# Patient Record
Sex: Male | Born: 1935
Health system: Southern US, Community
[De-identification: ages and names within clinical notes are randomized; demographics above are authoritative.]

## PROBLEM LIST (undated history)

## (undated) DIAGNOSIS — Z978 Presence of other specified devices: Secondary | ICD-10-CM

## (undated) DIAGNOSIS — R0600 Dyspnea, unspecified: Secondary | ICD-10-CM

## (undated) DIAGNOSIS — C61 Malignant neoplasm of prostate: Secondary | ICD-10-CM

## (undated) DIAGNOSIS — I10 Essential (primary) hypertension: Secondary | ICD-10-CM

## (undated) DIAGNOSIS — I509 Heart failure, unspecified: Secondary | ICD-10-CM

## (undated) DIAGNOSIS — E785 Hyperlipidemia, unspecified: Secondary | ICD-10-CM

## (undated) DIAGNOSIS — I2510 Atherosclerotic heart disease of native coronary artery without angina pectoris: Secondary | ICD-10-CM

## (undated) DIAGNOSIS — E669 Obesity, unspecified: Secondary | ICD-10-CM

## (undated) DIAGNOSIS — N179 Acute kidney failure, unspecified: Secondary | ICD-10-CM

## (undated) DIAGNOSIS — I219 Acute myocardial infarction, unspecified: Secondary | ICD-10-CM

## (undated) HISTORY — DX: Essential (primary) hypertension: I10

## (undated) HISTORY — DX: Atherosclerotic heart disease of native coronary artery without angina pectoris: I25.10

## (undated) HISTORY — DX: Hyperlipidemia, unspecified: E78.5

## (undated) HISTORY — DX: Obesity, unspecified: E66.9

## (undated) HISTORY — PX: OTHER SURGICAL HISTORY: SHX169

## (undated) HISTORY — DX: Malignant neoplasm of prostate: C61

## (undated) HISTORY — PX: TONSILLECTOMY: SUR1361

---

## 1998-10-03 DIAGNOSIS — I219 Acute myocardial infarction, unspecified: Secondary | ICD-10-CM

## 1998-10-03 HISTORY — PX: OTHER SURGICAL HISTORY: SHX169

## 1998-10-03 HISTORY — DX: Acute myocardial infarction, unspecified: I21.9

## 2001-10-03 DIAGNOSIS — Z8546 Personal history of malignant neoplasm of prostate: Secondary | ICD-10-CM | POA: Insufficient documentation

## 2010-05-06 ENCOUNTER — Ambulatory Visit: Payer: Self-pay | Admitting: Cardiology

## 2010-05-11 ENCOUNTER — Telehealth (INDEPENDENT_AMBULATORY_CARE_PROVIDER_SITE_OTHER): Payer: Self-pay | Admitting: *Deleted

## 2010-05-12 ENCOUNTER — Encounter (HOSPITAL_COMMUNITY): Admission: RE | Admit: 2010-05-12 | Discharge: 2010-06-24 | Payer: Self-pay | Admitting: Cardiology

## 2010-05-12 ENCOUNTER — Encounter: Payer: Self-pay | Admitting: Cardiology

## 2010-05-12 ENCOUNTER — Ambulatory Visit: Payer: Self-pay | Admitting: Cardiology

## 2010-05-12 ENCOUNTER — Ambulatory Visit: Payer: Self-pay

## 2010-11-02 NOTE — Assessment & Plan Note (Signed)
Summary: Cardiology Nuclear Testing  Nuclear Med Background Indications for Stress Test: Evaluation for Ischemia, Stent Patency, PTCA Patency   History: Angioplasty, Heart Catheterization, Myocardial Infarction, Myocardial Perfusion Study, Stents  History Comments: '00 MI>PTCA/Stent-LAD; '06 Cath:no report; '09 MPS:EF=51%, anterior scar   Symptoms Comments: No cardiac complaints. Follow up PTCA/Stent.   Nuclear Pre-Procedure Cardiac Risk Factors: Family History - CAD, History of Smoking, Hypertension, Lipids Caffeine/Decaff Intake: none NPO After: 6:30 PM Lungs: Clear.  O2 Sat 96% on RA. IV 0.9% NS with Angio Cath: 22g     IV Site: Rt Hand IV Started by: Bonnita Levan RN Chest Size (in) 44-46     Height (in): 73 Weight (lb): 208 BMI: 27.54 Tech Comments: Held Lopressor x 12 hours  Nuclear Med Study 1 or 2 day study:  1 day     Stress Test Type:  treadmill/Lexiscan Reading MD:  Marca Ancona, MD     Referring MD:  Roger Shelter, MD Resting Radionuclide:  Technetium 12m Tetrofosmin     Resting Radionuclide Dose:  10.9 mCi  Stress Radionuclide:  Technetium 65m Tetrofosmin     Stress Radionuclide Dose:  33.0 mCi   Stress Protocol Exercise Time (min):  2:00 min     Max HR:  102 bpm     Predicted Max HR:  147 bpm       Percent Max HR:  69.39 % Lexiscan: 0.4 mg   Stress Test Technologist:  Rea College CMA-N     Nuclear Technologist:  Harlow Asa CNMT  Rest Procedure  Myocardial perfusion imaging was performed at rest 45 minutes following the intravenous administration of Myoview Technetium 36m Tetrofosmin.  Stress Procedure  The patient received IV Lexiscan 0.4 mg over 15-seconds with concurrent low level exercise and then myoview was injected at 30-seconds while the patient continued walking one more minute.  There were no significant changes with infusion.  Quantitative spect images were obtained after a 45 minute delay.  QPS Raw Data Images:  Normal; no motion  artifact; normal heart/lung ratio. Stress Images:  Mid to apical anterior and apical septal perfusion defect.  Rest Images:  Mid to apical anterior and apical septal perfusion defect.  Subtraction (SDS):  Fixed mid to apical anterior and apical septal perfusion defect.  Transient Ischemic Dilatation:  1.00  (Normal <1.22)  Lung/Heart Ratio:  0.38  (Normal <0.45)  Quantitative Gated Spect Images QGS EDV:  108 ml QGS ESV:  55 ml QGS EF:  49 % QGS cine images:  Mid to apical anterior and true apex hypokinesis.    Overall Impression  Exercise Capacity: Lexiscan study.  BP Response: Normal blood pressure response. Clinical Symptoms: Dizzy ECG Impression: RBBB + old anteroseptal MI with no significant change with infusion.  Overall Impression: Fixed mid to apical anterior and apical septal perfusion defect suggestive of prior MI.  Mid to apical anterior and true apex hypokinesis with EF 49%.  No significant ischemia.

## 2010-11-02 NOTE — Progress Notes (Signed)
Summary: Nuclear Pre-Procedure  Phone Note Outgoing Call Call back at [facility] Phone [phone]   Call placed by: Stanton Kidney, EMT-P,  May 11, 2010 2:42 PM Call placed to: Patient Action Taken: Phone Call Completed Summary of Call: Reviewed information on Myoview Information Sheet (see scanned document for further details).  Spoke with the patient.    Nuclear Med Background Indications for Stress Test: Evaluation for Ischemia, Stent Patency   History: Abnormal EKG, Angioplasty, Heart Catheterization, Myocardial Infarction, Myocardial Perfusion Study, Stents  History Comments: 7/00 MI > Angioplasty > Stent: LAD '06 Heart Cath 7/09 MPS: EF= 51%, ant. scar     Nuclear Pre-Procedure Cardiac Risk Factors: Family History - CAD, History of Smoking, Hypertension, Lipids

## 2010-11-22 ENCOUNTER — Ambulatory Visit (INDEPENDENT_AMBULATORY_CARE_PROVIDER_SITE_OTHER): Payer: Medicare Other | Admitting: Cardiology

## 2010-11-22 DIAGNOSIS — I2510 Atherosclerotic heart disease of native coronary artery without angina pectoris: Secondary | ICD-10-CM

## 2011-11-25 ENCOUNTER — Encounter: Payer: Self-pay | Admitting: *Deleted

## 2011-11-25 ENCOUNTER — Encounter: Payer: Self-pay | Admitting: Cardiology

## 2011-11-25 ENCOUNTER — Ambulatory Visit (INDEPENDENT_AMBULATORY_CARE_PROVIDER_SITE_OTHER): Payer: Medicare Other | Admitting: Cardiology

## 2011-11-25 VITALS — BP 110/64 | HR 51 | Ht 72.0 in | Wt 219.0 lb

## 2011-11-25 DIAGNOSIS — I10 Essential (primary) hypertension: Secondary | ICD-10-CM | POA: Insufficient documentation

## 2011-11-25 DIAGNOSIS — M3300 Juvenile dermatopolymyositis, organ involvement unspecified: Secondary | ICD-10-CM | POA: Insufficient documentation

## 2011-11-25 DIAGNOSIS — I2510 Atherosclerotic heart disease of native coronary artery without angina pectoris: Secondary | ICD-10-CM | POA: Insufficient documentation

## 2011-11-25 DIAGNOSIS — E782 Mixed hyperlipidemia: Secondary | ICD-10-CM | POA: Insufficient documentation

## 2011-11-25 LAB — HEPATIC FUNCTION PANEL
Bilirubin, Direct: 0.1 mg/dL (ref 0.0–0.3)
Total Bilirubin: 0.7 mg/dL (ref 0.3–1.2)
Total Protein: 6.6 g/dL (ref 6.0–8.3)

## 2011-11-25 LAB — BASIC METABOLIC PANEL
BUN: 23 mg/dL (ref 6–23)
Chloride: 101 mEq/L (ref 96–112)
Glucose, Bld: 100 mg/dL — ABNORMAL HIGH (ref 70–99)
Potassium: 4.6 mEq/L (ref 3.5–5.1)

## 2011-11-25 LAB — LIPID PANEL
HDL: 54.1 mg/dL (ref >39.00)
VLDL: 32 mg/dL (ref 0.0–40.0)

## 2011-11-25 MED ORDER — NITROGLYCERIN 0.4 MG SL SUBL: 0.4000 mg | SUBLINGUAL_TABLET | SUBLINGUAL | Status: DC | PRN

## 2011-11-25 MED ORDER — LISINOPRIL 40 MG PO TABS: 40.0000 mg | ORAL_TABLET | Freq: Every day | ORAL | Status: DC

## 2011-11-25 MED ORDER — SIMVASTATIN 40 MG PO TABS: 40.0000 mg | ORAL_TABLET | Freq: Every evening | ORAL | Status: DC

## 2011-11-25 MED ORDER — METOPROLOL TARTRATE 50 MG PO TABS: 50.0000 mg | ORAL_TABLET | Freq: Two times a day (BID) | ORAL | Status: DC

## 2011-11-25 NOTE — Patient Instructions (Signed)
Your physician wants you to follow-up in: ONE YEAR  You will receive a reminder letter in the mail two months in advance. If you don't receive a letter, please call our office to schedule the follow-up appointment.   Your physician recommends that you return for lab work in: TODAY  

## 2011-11-25 NOTE — Assessment & Plan Note (Signed)
Continue aspirin, statin and beta blocker. Continue risk factor modification. Last Myoview shows no ischemia.

## 2011-11-25 NOTE — Assessment & Plan Note (Signed)
Continue statin. Check lipids and liver. 

## 2011-11-25 NOTE — Assessment & Plan Note (Signed)
Blood pressure controlled. Continue present medications. Check potassium and renal function. 

## 2011-11-25 NOTE — Progress Notes (Signed)
   HPI: Pleasant male previously followed by Dr. Deborah Chalk for followup of coronary artery disease. Patient had a myocardial infarction in July of 2000. He was treated with TPA and had stenting of his LAD and PTCA of his second diagonal. He apparently had a cardiac catheterization in 2006 in [facility]. This reveals a normal left main. There was a 40% percent proximal right coronary artery and a 20% LAD. There was mitral valve prolapse with mild mitral regurgitation. Ejection fraction was 63%. Last nuclear study was performed in August of 2011. This showed an ejection fraction of 49%. There was a prior infarct but no ischemia. Patient was last seen in February of 2012. Since then, the patient denies any dyspnea on exertion, orthopnea, PND, pedal edema, palpitations, syncope or chest pain.   Current Outpatient Prescriptions  Medication Sig Dispense Refill  . aspirin 325 MG EC tablet Take 325 mg by mouth daily.      . Calcium Carb-Cholecalciferol (CALTRATE 600+D) 600-800 MG-UNIT TABS Take 1 tablet by mouth daily.      Marland Kitchen lisinopril (PRINIVIL,ZESTRIL) 40 MG tablet Take 40 mg by mouth daily.      . metoprolol (LOPRESSOR) 50 MG tablet Take 50 mg by mouth 2 (two) times daily.      . Multiple Vitamin (MULTIVITAMIN) capsule Take 1 capsule by mouth daily.      . nitroGLYCERIN (NITROSTAT) 0.4 MG SL tablet Place 0.4 mg under the tongue every 5 (five) minutes as needed.      . Omega-3 Fatty Acids (FISH OIL PO) Take 1 tablet by mouth daily.      . simvastatin (ZOCOR) 40 MG tablet Take 40 mg by mouth every evening.      . vitamin C (ASCORBIC ACID) 500 MG tablet Take 500 mg by mouth daily.         Past Medical History  Diagnosis Date  . HTN (hypertension)   . CAD (coronary artery disease)   . Hyperlipidemia   . Obesity   . Prostate cancer     Past Surgical History  Procedure Date  . Stenting of lad     History   Social History  . Marital Status: Married    Spouse Name: N/A    Number of Children:  N/A  . Years of Education: N/A   Occupational History  . Not on file.   Social History Main Topics  . Smoking status: Former Games developer  . Smokeless tobacco: Not on file  . Alcohol Use: Not on file  . Drug Use: Not on file  . Sexually Active: Not on file   Other Topics Concern  . Not on file   Social History Narrative  . No narrative on file    ROS: no fevers or chills, productive cough, hemoptysis, dysphasia, odynophagia, melena, hematochezia, dysuria, hematuria, rash, seizure activity, orthopnea, PND, pedal edema, claudication. Remaining systems are negative.  Physical Exam: Well-developed well-nourished in no acute distress.  Skin is warm and dry.  HEENT is normal.  Neck is supple. No thyromegaly.  Chest is clear to auscultation with normal expansion.  Cardiovascular exam is regular rate and rhythm.  Abdominal exam nontender or distended. No masses palpated. Extremities show no edema. neuro grossly intact  ECG sinus bradycardia at a rate of 51. Right bundle branch block. Prior septal infarct. Prior lateral infarct. Nonspecific ST changes.

## 2011-11-28 ENCOUNTER — Telehealth: Payer: Self-pay | Admitting: Cardiology

## 2011-11-28 MED ORDER — ATORVASTATIN CALCIUM 40 MG PO TABS: 40.0000 mg | ORAL_TABLET | Freq: Every day | ORAL | Status: DC

## 2011-11-28 NOTE — Telephone Encounter (Signed)
Spoke with pt, aware of dr Ludwig Clarks recommendations. He will finish the zocor he has and then change. He will call to schedule blood work.

## 2011-11-28 NOTE — Telephone Encounter (Signed)
New problem:  Calling back regarding test results

## 2011-11-28 NOTE — Telephone Encounter (Signed)
Message copied by Freddi Starr on Mon Nov 28, 2011  2:57 PM ------      Message from: Lewayne Bunting      Created: Fri Nov 25, 2011  4:37 PM       Dc zocor      lipitor 40 mg po daily; lipids and liver in six weeks      Olga Millers

## 2011-11-30 NOTE — Progress Notes (Signed)
Addended by: Judithe Modest D on: 11/30/2011 04:54 PM   Modules accepted: Orders

## 2012-02-08 ENCOUNTER — Telehealth: Payer: Self-pay | Admitting: Cardiology

## 2012-02-08 DIAGNOSIS — E7800 Pure hypercholesterolemia, unspecified: Secondary | ICD-10-CM

## 2012-02-08 NOTE — Telephone Encounter (Signed)
Spoke with pt, labs scheduled for the end of June.

## 2012-02-08 NOTE — Telephone Encounter (Signed)
FYI:  Patient calling to let Nurse DM know he has started taking the atorvastatin medication as of tomorrow evening.  He can be reached at [phone] for additional information.

## 2012-03-28 ENCOUNTER — Other Ambulatory Visit (INDEPENDENT_AMBULATORY_CARE_PROVIDER_SITE_OTHER): Payer: Medicare Other

## 2012-03-28 DIAGNOSIS — E7800 Pure hypercholesterolemia, unspecified: Secondary | ICD-10-CM

## 2012-03-28 LAB — LIPID PANEL
Cholesterol: 169 mg/dL (ref 0–200)
HDL: 46.4 mg/dL (ref >39.00)
LDL Cholesterol: 83 mg/dL (ref 0–99)
VLDL: 39.2 mg/dL (ref 0.0–40.0)

## 2012-03-28 LAB — HEPATIC FUNCTION PANEL: Albumin: 3.6 g/dL (ref 3.5–5.2)

## 2012-03-29 ENCOUNTER — Encounter: Payer: Self-pay | Admitting: *Deleted

## 2012-11-23 ENCOUNTER — Encounter: Payer: Self-pay | Admitting: Cardiology

## 2012-11-23 ENCOUNTER — Ambulatory Visit (INDEPENDENT_AMBULATORY_CARE_PROVIDER_SITE_OTHER): Payer: Medicare Other | Admitting: Cardiology

## 2012-11-23 VITALS — BP 100/68 | HR 52 | Wt 217.0 lb

## 2012-11-23 DIAGNOSIS — R0989 Other specified symptoms and signs involving the circulatory and respiratory systems: Secondary | ICD-10-CM | POA: Insufficient documentation

## 2012-11-23 MED ORDER — LISINOPRIL 40 MG PO TABS: 40.0000 mg | ORAL_TABLET | Freq: Every day | ORAL | Status: DC

## 2012-11-23 MED ORDER — NITROGLYCERIN 0.4 MG SL SUBL: 0.4000 mg | SUBLINGUAL_TABLET | SUBLINGUAL | Status: DC | PRN

## 2012-11-23 MED ORDER — ATORVASTATIN CALCIUM 40 MG PO TABS: 40.0000 mg | ORAL_TABLET | Freq: Every day | ORAL | Status: DC

## 2012-11-23 MED ORDER — METOPROLOL TARTRATE 50 MG PO TABS: 50.0000 mg | ORAL_TABLET | Freq: Two times a day (BID) | ORAL | Status: DC

## 2012-11-23 NOTE — Assessment & Plan Note (Signed)
Continue aspirin and statin.plan Myoview in one year.

## 2012-11-23 NOTE — Progress Notes (Signed)
HPI: Pleasant male for followup of coronary artery disease. Patient had a myocardial infarction in July of 2000. He was treated with TPA and had stenting of his LAD and PTCA of his second diagonal. He apparently had a cardiac catheterization in 2006 in [facility]. This revealed a normal left main. There was a 40% percent proximal right coronary artery and a 20% LAD. There was mitral valve prolapse with mild mitral regurgitation. Ejection fraction was 63%. Last nuclear study was performed in August of 2011. This showed an ejection fraction of 49%. There was a prior infarct but no ischemia. Patient was last seen in February of 2013. Since then, the patient denies any dyspnea on exertion, orthopnea, PND, pedal edema, palpitations, syncope or chest pain.   Current Outpatient Prescriptions  Medication Sig Dispense Refill  . aspirin 325 MG EC tablet Take 325 mg by mouth daily.      Marland Kitchen atorvastatin (LIPITOR) 40 MG tablet Take 1 tablet (40 mg total) by mouth daily.  30 tablet  11  . Calcium Carb-Cholecalciferol (CALTRATE 600+D) 600-800 MG-UNIT TABS Take 1 tablet by mouth daily.      . Coenzyme Q10 (COQ10 PO) Take 1 tablet by mouth daily.      Marland Kitchen lisinopril (PRINIVIL,ZESTRIL) 40 MG tablet Take 1 tablet (40 mg total) by mouth daily.  90 tablet  3  . metoprolol (LOPRESSOR) 50 MG tablet Take 1 tablet (50 mg total) by mouth 2 (two) times daily.  180 tablet  3  . Multiple Vitamin (MULTIVITAMIN) capsule Take 1 capsule by mouth daily.      . nitroGLYCERIN (NITROSTAT) 0.4 MG SL tablet Place 1 tablet (0.4 mg total) under the tongue every 5 (five) minutes as needed.  50 tablet  12  . Omega-3 Fatty Acids (FISH OIL PO) Take 1 tablet by mouth daily.      . vitamin C (ASCORBIC ACID) 500 MG tablet Take 500 mg by mouth daily.       No current facility-administered medications for this visit.     Past Medical History  Diagnosis Date  . HTN (hypertension)   . CAD (coronary artery disease)   . Hyperlipidemia   .  Obesity   . Prostate cancer     Past Surgical History  Procedure Laterality Date  . Stenting of lad      History   Social History  . Marital Status: Married    Spouse Name: N/A    Number of Children: N/A  . Years of Education: N/A   Occupational History  . Not on file.   Social History Main Topics  . Smoking status: Former Games developer  . Smokeless tobacco: Not on file  . Alcohol Use: Not on file  . Drug Use: Not on file  . Sexually Active: Not on file   Other Topics Concern  . Not on file   Social History Narrative  . No narrative on file    ROS: no fevers or chills, productive cough, hemoptysis, dysphasia, odynophagia, melena, hematochezia, dysuria, hematuria, rash, seizure activity, orthopnea, PND, pedal edema, claudication. Remaining systems are negative.  Physical Exam: Well-developed well-nourished in no acute distress.  Skin is warm and dry.  HEENT is normal.  Neck is supple.  Chest is clear to auscultation with normal expansion.  Cardiovascular exam is regular rate and rhythm.  Abdominal exam nontender or distended. No masses palpated. Positive bruit Extremities show no edema. neuro grossly intact  ECG sinus bradycardia at a rate of 52. Right bundle  branch block. Prior septal infarct.

## 2012-11-23 NOTE — Assessment & Plan Note (Signed)
Blood pressure controlled. Continue present medications. Potassium and renal function monitored by primary care. 

## 2012-11-23 NOTE — Patient Instructions (Addendum)
Your physician wants you to follow-up in: ONE YEAR WITH DR CRENSHAW You will receive a reminder letter in the mail two months in advance. If you don't receive a letter, please call our office to schedule the follow-up appointment.   Your physician has requested that you have an abdominal aorta duplex. During this test, an ultrasound is used to evaluate the aorta. Allow 30 minutes for this exam. Do not eat after midnight the day before and avoid carbonated beverages  

## 2012-11-23 NOTE — Assessment & Plan Note (Signed)
Continue statin.lipids and liver monitored by primary care. 

## 2012-11-23 NOTE — Assessment & Plan Note (Signed)
Soft abdominal bruit. Check ultrasound to exclude aneurysm.

## 2012-12-31 ENCOUNTER — Encounter (INDEPENDENT_AMBULATORY_CARE_PROVIDER_SITE_OTHER): Payer: Medicare Other

## 2012-12-31 DIAGNOSIS — R0989 Other specified symptoms and signs involving the circulatory and respiratory systems: Secondary | ICD-10-CM

## 2013-01-08 ENCOUNTER — Other Ambulatory Visit: Payer: Self-pay | Admitting: Cardiology

## 2013-05-08 ENCOUNTER — Other Ambulatory Visit: Payer: Self-pay

## 2013-08-08 ENCOUNTER — Other Ambulatory Visit: Payer: Self-pay

## 2013-09-10 ENCOUNTER — Other Ambulatory Visit (HOSPITAL_COMMUNITY): Payer: Self-pay | Admitting: Urology

## 2013-09-10 DIAGNOSIS — C61 Malignant neoplasm of prostate: Secondary | ICD-10-CM

## 2013-09-20 ENCOUNTER — Other Ambulatory Visit (HOSPITAL_COMMUNITY): Payer: Self-pay | Admitting: Urology

## 2013-09-20 ENCOUNTER — Ambulatory Visit (HOSPITAL_COMMUNITY)
Admission: RE | Admit: 2013-09-20 | Discharge: 2013-09-20 | Disposition: A | Discharge status: Home / Self Care | Payer: Medicare Other | Source: Ambulatory Visit | Attending: Urology | Admitting: Urology

## 2013-09-20 ENCOUNTER — Encounter (HOSPITAL_COMMUNITY): Admission: RE | Admit: 2013-09-20 | Payer: Medicare Other | Source: Ambulatory Visit

## 2013-09-20 DIAGNOSIS — C61 Malignant neoplasm of prostate: Secondary | ICD-10-CM | POA: Insufficient documentation

## 2013-09-20 DIAGNOSIS — I10 Essential (primary) hypertension: Secondary | ICD-10-CM | POA: Insufficient documentation

## 2013-09-20 DIAGNOSIS — I2510 Atherosclerotic heart disease of native coronary artery without angina pectoris: Secondary | ICD-10-CM | POA: Insufficient documentation

## 2013-09-20 MED ORDER — TECHNETIUM TC 99M MEDRONATE IV KIT
26.1000 | PACK | Freq: Once | INTRAVENOUS | Status: AC | PRN
  Administered 2013-09-20: 26.1 via INTRAVENOUS

## 2013-09-24 ENCOUNTER — Ambulatory Visit (HOSPITAL_COMMUNITY)
Admission: RE | Admit: 2013-09-24 | Discharge: 2013-09-24 | Disposition: A | Discharge status: Home / Self Care | Payer: Medicare Other | Source: Ambulatory Visit | Attending: Urology | Admitting: Urology

## 2013-09-24 ENCOUNTER — Other Ambulatory Visit: Payer: Self-pay | Admitting: Urology

## 2013-09-24 DIAGNOSIS — R52 Pain, unspecified: Secondary | ICD-10-CM

## 2013-09-24 DIAGNOSIS — M5030 Other cervical disc degeneration, unspecified cervical region: Secondary | ICD-10-CM | POA: Insufficient documentation

## 2013-09-24 DIAGNOSIS — M47812 Spondylosis without myelopathy or radiculopathy, cervical region: Secondary | ICD-10-CM | POA: Insufficient documentation

## 2013-09-24 DIAGNOSIS — Z8546 Personal history of malignant neoplasm of prostate: Secondary | ICD-10-CM | POA: Insufficient documentation

## 2013-09-24 DIAGNOSIS — M948X9 Other specified disorders of cartilage, unspecified sites: Secondary | ICD-10-CM | POA: Insufficient documentation

## 2013-09-24 DIAGNOSIS — M47817 Spondylosis without myelopathy or radiculopathy, lumbosacral region: Secondary | ICD-10-CM | POA: Insufficient documentation

## 2013-10-16 DIAGNOSIS — C61 Malignant neoplasm of prostate: Secondary | ICD-10-CM | POA: Diagnosis not present

## 2013-11-11 DIAGNOSIS — C61 Malignant neoplasm of prostate: Secondary | ICD-10-CM | POA: Diagnosis not present

## 2013-11-18 DIAGNOSIS — C61 Malignant neoplasm of prostate: Secondary | ICD-10-CM | POA: Diagnosis not present

## 2013-12-02 ENCOUNTER — Ambulatory Visit (INDEPENDENT_AMBULATORY_CARE_PROVIDER_SITE_OTHER): Payer: Medicare Other | Admitting: Cardiology

## 2013-12-02 ENCOUNTER — Encounter: Payer: Self-pay | Admitting: Cardiology

## 2013-12-02 VITALS — BP 116/68 | HR 58 | Ht 72.0 in | Wt 218.1 lb

## 2013-12-02 DIAGNOSIS — E785 Hyperlipidemia, unspecified: Secondary | ICD-10-CM | POA: Diagnosis not present

## 2013-12-02 DIAGNOSIS — I10 Essential (primary) hypertension: Secondary | ICD-10-CM

## 2013-12-02 DIAGNOSIS — I2510 Atherosclerotic heart disease of native coronary artery without angina pectoris: Secondary | ICD-10-CM | POA: Diagnosis not present

## 2013-12-02 MED ORDER — ATORVASTATIN CALCIUM 40 MG PO TABS: 20.0000 mg | ORAL_TABLET | Freq: Every day | ORAL | Status: DC

## 2013-12-02 MED ORDER — NITROGLYCERIN 0.4 MG SL SUBL: 0.4000 mg | SUBLINGUAL_TABLET | SUBLINGUAL | Status: DC | PRN

## 2013-12-02 MED ORDER — METOPROLOL TARTRATE 50 MG PO TABS: 50.0000 mg | ORAL_TABLET | Freq: Two times a day (BID) | ORAL | Status: DC

## 2013-12-02 MED ORDER — LISINOPRIL 40 MG PO TABS: 40.0000 mg | ORAL_TABLET | Freq: Every day | ORAL | Status: DC

## 2013-12-02 NOTE — Addendum Note (Signed)
Addended by: Cristopher Estimable on: 12/02/2013 04:40 PM   Modules accepted: Orders

## 2013-12-02 NOTE — Assessment & Plan Note (Signed)
Continue low-dose statin. Check lipids and liver.

## 2013-12-02 NOTE — Assessment & Plan Note (Signed)
Blood pressure controlled. Continue present medications. 

## 2013-12-02 NOTE — Progress Notes (Signed)
HPI: FU coronary artery disease. Patient had a myocardial infarction in July of 2000. He was treated with TPA and had stenting of his LAD and PTCA of his second diagonal. He apparently had a cardiac catheterization in 2006 in [facility]. This revealed a normal left main. There was a 40% percent proximal right coronary artery and a 20% LAD. There was mitral valve prolapse with mild mitral regurgitation. Ejection fraction was 63%. Last nuclear study was performed in August of 2011. This showed an ejection fraction of 49%. There was a prior infarct but no ischemia. Abdominal ultrasound in April of 2014 showed no aneurysm. Patient was last seen in February of 2014. Since then, the patient denies any dyspnea on exertion, orthopnea, PND, pedal edema, palpitations, syncope or chest pain. He did decrease his Lipitor because of back pain. He is now taking 20 mg daily with resolution of his pain.   Current Outpatient Prescriptions  Medication Sig Dispense Refill  . aspirin 325 MG EC tablet Take 325 mg by mouth daily.      Marland Kitchen aspirin 81 MG tablet Take 81 mg by mouth daily.      Marland Kitchen atorvastatin (LIPITOR) 40 MG tablet Take 20 mg by mouth daily.      . Calcium Carb-Cholecalciferol (CALTRATE 600+D) 600-800 MG-UNIT TABS Take 1 tablet by mouth daily.      . Coenzyme Q10 (COQ10 PO) Take 1 tablet by mouth daily.      Marland Kitchen lisinopril (PRINIVIL,ZESTRIL) 40 MG tablet Take 1 tablet (40 mg total) by mouth daily.  90 tablet  3  . metoprolol (LOPRESSOR) 50 MG tablet TAKE 1 TABLET BY MOUTH TWICE DAILY  180 tablet  3  . Multiple Vitamin (MULTIVITAMIN) capsule Take 1 capsule by mouth daily.      . Multiple Vitamins-Minerals (CENTRUM PO) Take by mouth daily.      . nitroGLYCERIN (NITROSTAT) 0.4 MG SL tablet Place 1 tablet (0.4 mg total) under the tongue every 5 (five) minutes as needed.  50 tablet  12  . Omega-3 Fatty Acids (FISH OIL PO) Take 1 tablet by mouth daily.      . vitamin C (ASCORBIC ACID) 500 MG tablet Take 500  mg by mouth daily.       No current facility-administered medications for this visit.     Past Medical History  Diagnosis Date  . HTN (hypertension)   . CAD (coronary artery disease)   . Hyperlipidemia   . Obesity   . Prostate cancer     Past Surgical History  Procedure Laterality Date  . Stenting of lad      History   Social History  . Marital Status: Married    Spouse Name: N/A    Number of Children: N/A  . Years of Education: N/A   Occupational History  . Not on file.   Social History Main Topics  . Smoking status: Former Research scientist (life sciences)  . Smokeless tobacco: Not on file  . Alcohol Use: Not on file  . Drug Use: Not on file  . Sexual Activity: Not on file   Other Topics Concern  . Not on file   Social History Narrative  . No narrative on file    ROS: no fevers or chills, productive cough, hemoptysis, dysphasia, odynophagia, melena, hematochezia, dysuria, hematuria, rash, seizure activity, orthopnea, PND, pedal edema, claudication. Remaining systems are negative.  Physical Exam: Well-developed well-nourished in no acute distress.  Skin is warm and dry.  HEENT is normal.  Neck is supple.  Chest is clear to auscultation with normal expansion.  Cardiovascular exam is regular rate and rhythm.  Abdominal exam nontender or distended. No masses palpated. Extremities show no edema. neuro grossly intact  ECG sinus rhythm at a rate of 58. Left ventricular hypertrophy. Right bundle branch block. Cannot rule out prior septal infarct. Left axis deviation.

## 2013-12-02 NOTE — Patient Instructions (Signed)
Your physician wants you to follow-up in: [place] will receive a reminder letter in the mail two months in advance. If you don't receive a letter, please call our office to schedule the follow-up appointment.   Your physician recommends that you return for lab work PRIOR TO EATING BREAKFAST  STOP EVENING DOSE OF ASPIRIN

## 2013-12-02 NOTE — Assessment & Plan Note (Signed)
Continue 325 mg of aspirin in the morning but discontinue aspirin in the evening. Continue statin.

## 2013-12-31 ENCOUNTER — Other Ambulatory Visit (INDEPENDENT_AMBULATORY_CARE_PROVIDER_SITE_OTHER): Payer: Medicare Other

## 2013-12-31 DIAGNOSIS — I2510 Atherosclerotic heart disease of native coronary artery without angina pectoris: Secondary | ICD-10-CM

## 2013-12-31 DIAGNOSIS — I10 Essential (primary) hypertension: Secondary | ICD-10-CM | POA: Diagnosis not present

## 2013-12-31 DIAGNOSIS — E785 Hyperlipidemia, unspecified: Secondary | ICD-10-CM

## 2013-12-31 LAB — LIPID PANEL
CHOLESTEROL: 159 mg/dL (ref 0–200)
HDL: 43.3 mg/dL (ref >39.00)
LDL Cholesterol: 84 mg/dL (ref 0–99)
Total CHOL/HDL Ratio: 4
Triglycerides: 159 mg/dL — ABNORMAL HIGH (ref 0.0–149.0)
VLDL: 31.8 mg/dL (ref 0.0–40.0)

## 2013-12-31 LAB — HEPATIC FUNCTION PANEL
ALBUMIN: 3.8 g/dL (ref 3.5–5.2)
ALT: 24 U/L (ref 0–53)
AST: 21 U/L (ref 0–37)
Alkaline Phosphatase: 43 U/L (ref 39–117)
Bilirubin, Direct: 0.1 mg/dL (ref 0.0–0.3)
TOTAL PROTEIN: 6.5 g/dL (ref 6.0–8.3)
Total Bilirubin: 0.7 mg/dL (ref 0.3–1.2)

## 2014-02-11 DIAGNOSIS — C61 Malignant neoplasm of prostate: Secondary | ICD-10-CM | POA: Diagnosis not present

## 2014-02-18 DIAGNOSIS — C61 Malignant neoplasm of prostate: Secondary | ICD-10-CM | POA: Diagnosis not present

## 2014-05-20 DIAGNOSIS — C61 Malignant neoplasm of prostate: Secondary | ICD-10-CM | POA: Diagnosis not present

## 2014-05-27 DIAGNOSIS — C61 Malignant neoplasm of prostate: Secondary | ICD-10-CM | POA: Diagnosis not present

## 2014-08-11 DIAGNOSIS — Z23 Encounter for immunization: Secondary | ICD-10-CM | POA: Diagnosis not present

## 2014-09-02 DIAGNOSIS — C61 Malignant neoplasm of prostate: Secondary | ICD-10-CM | POA: Diagnosis not present

## 2014-09-09 DIAGNOSIS — C61 Malignant neoplasm of prostate: Secondary | ICD-10-CM | POA: Diagnosis not present

## 2014-09-10 DIAGNOSIS — Z23 Encounter for immunization: Secondary | ICD-10-CM | POA: Diagnosis not present

## 2014-09-10 DIAGNOSIS — Z0000 Encounter for general adult medical examination without abnormal findings: Secondary | ICD-10-CM | POA: Diagnosis not present

## 2014-10-06 DIAGNOSIS — Z23 Encounter for immunization: Secondary | ICD-10-CM | POA: Diagnosis not present

## 2014-12-01 DIAGNOSIS — C61 Malignant neoplasm of prostate: Secondary | ICD-10-CM | POA: Diagnosis not present

## 2014-12-04 ENCOUNTER — Encounter: Payer: Self-pay | Admitting: Cardiology

## 2014-12-04 ENCOUNTER — Ambulatory Visit (INDEPENDENT_AMBULATORY_CARE_PROVIDER_SITE_OTHER): Payer: Medicare Other | Admitting: Cardiology

## 2014-12-04 DIAGNOSIS — I2510 Atherosclerotic heart disease of native coronary artery without angina pectoris: Secondary | ICD-10-CM

## 2014-12-04 DIAGNOSIS — I10 Essential (primary) hypertension: Secondary | ICD-10-CM | POA: Diagnosis not present

## 2014-12-04 DIAGNOSIS — E785 Hyperlipidemia, unspecified: Secondary | ICD-10-CM | POA: Diagnosis not present

## 2014-12-04 MED ORDER — LISINOPRIL 20 MG PO TABS: 20.0000 mg | ORAL_TABLET | Freq: Every day | ORAL | Status: DC

## 2014-12-04 MED ORDER — METOPROLOL TARTRATE 50 MG PO TABS: 50.0000 mg | ORAL_TABLET | Freq: Two times a day (BID) | ORAL | Status: DC

## 2014-12-04 MED ORDER — PRAVASTATIN SODIUM 40 MG PO TABS: 40.0000 mg | ORAL_TABLET | Freq: Every evening | ORAL | Status: DC

## 2014-12-04 NOTE — Patient Instructions (Signed)
Your physician wants you to follow-up in: [place] will receive a reminder letter in the mail two months in advance. If you don't receive a letter, please call our office to schedule the follow-up appointment.   Your physician has requested that you have a lexiscan myoview. For further information please visit [email or web address]. Please follow instruction sheet, as given.   DECREASE LISINOPRIL TO 20 MG ONCE DAILY  START PRAVASTATIN 40 MG ONCE DAILY  Your physician recommends that you return for lab work in [place] TO LAB WORK

## 2014-12-04 NOTE — Assessment & Plan Note (Signed)
Patient has not tolerated Lipitor or Zocor. I will try Pravachol 40 mg daily. Check lipids and liver in 4 weeks.

## 2014-12-04 NOTE — Addendum Note (Signed)
Addended by: Cristopher Estimable on: 12/04/2014 04:27 PM   Modules accepted: Orders

## 2014-12-04 NOTE — Assessment & Plan Note (Signed)
Blood pressure running low. Decrease lisinopril to 20 mg daily. Check potassium and renal function.

## 2014-12-04 NOTE — Progress Notes (Signed)
      HPI: FU coronary artery disease. Patient had a myocardial infarction in July of 2000. He was treated with TPA and had stenting of his LAD and PTCA of his second diagonal. He apparently had a cardiac catheterization in 2006 in [facility]. This revealed a normal left main. There was a 40% percent proximal right coronary artery and a 20% LAD. There was mitral valve prolapse with mild mitral regurgitation. Ejection fraction was 63%. Last nuclear study was performed in August of 2011. This showed an ejection fraction of 49%. There was a prior infarct but no ischemia. Abdominal ultrasound in April of 2014 showed no aneurysm. Since last seen, the patient denies any dyspnea on exertion, orthopnea, PND, pedal edema, palpitations, syncope or chest pain.   Current Outpatient Prescriptions  Medication Sig Dispense Refill  . aspirin 325 MG EC tablet Take 325 mg by mouth daily.    . cholecalciferol (VITAMIN D) 1000 UNITS tablet Take 1,000 Units by mouth daily.    Marland Kitchen lisinopril (PRINIVIL,ZESTRIL) 40 MG tablet Take 1 tablet (40 mg total) by mouth daily. 90 tablet 3  . metoprolol (LOPRESSOR) 50 MG tablet Take 1 tablet (50 mg total) by mouth 2 (two) times daily. 180 tablet 3  . Multiple Vitamins-Minerals (CENTRUM PO) Take by mouth daily.    . nitroGLYCERIN (NITROSTAT) 0.4 MG SL tablet Place 1 tablet (0.4 mg total) under the tongue every 5 (five) minutes as needed. 50 tablet 12  . Omega-3 Fatty Acids (FISH OIL PO) Take 1 tablet by mouth daily.    . Red Yeast Rice 600 MG CAPS Take 1 capsule by mouth daily.     No current facility-administered medications for this visit.     Past Medical History  Diagnosis Date  . HTN (hypertension)   . CAD (coronary artery disease)   . Hyperlipidemia   . Obesity   . Prostate cancer     Past Surgical History  Procedure Laterality Date  . Stenting of lad      History   Social History  . Marital Status: Married    Spouse Name: N/A  . Number of Children: N/A    . Years of Education: N/A   Occupational History  . Not on file.   Social History Main Topics  . Smoking status: Former Research scientist (life sciences)  . Smokeless tobacco: Not on file  . Alcohol Use: Not on file  . Drug Use: Not on file  . Sexual Activity: Not on file   Other Topics Concern  . Not on file   Social History Narrative    ROS: knee arthralgias but no fevers or chills, productive cough, hemoptysis, dysphasia, odynophagia, melena, hematochezia, dysuria, hematuria, rash, seizure activity, orthopnea, PND, pedal edema, claudication. Remaining systems are negative.  Physical Exam: Well-developed well-nourished in no acute distress.  Skin is warm and dry.  HEENT is normal.  Neck is supple.  Chest is clear to auscultation with normal expansion.  Cardiovascular exam is regular rate and rhythm.  Abdominal exam nontender or distended. No masses palpated. Extremities show no edema. neuro grossly intact  ECG sinus bradycardia at a rate of 56. Left ventricular hypertrophy. Left axis deviation. Right bundle branch block. Prior septal infarct.

## 2014-12-04 NOTE — Assessment & Plan Note (Signed)
Continue aspirin. Schedule nuclear study for risk stratification.

## 2014-12-05 ENCOUNTER — Ambulatory Visit: Payer: Medicare Other | Admitting: Cardiology

## 2014-12-08 DIAGNOSIS — C61 Malignant neoplasm of prostate: Secondary | ICD-10-CM | POA: Diagnosis not present

## 2014-12-12 ENCOUNTER — Encounter (HOSPITAL_COMMUNITY): Payer: Self-pay | Admitting: *Deleted

## 2014-12-23 ENCOUNTER — Telehealth (HOSPITAL_COMMUNITY): Payer: Self-pay

## 2014-12-23 NOTE — Telephone Encounter (Signed)
Encounter complete. 

## 2014-12-25 ENCOUNTER — Ambulatory Visit (HOSPITAL_COMMUNITY)
Admission: RE | Admit: 2014-12-25 | Discharge: 2014-12-25 | Disposition: A | Discharge status: Home / Self Care | Payer: Medicare Other | Source: Ambulatory Visit | Attending: Cardiology | Admitting: Cardiology

## 2014-12-25 DIAGNOSIS — E785 Hyperlipidemia, unspecified: Secondary | ICD-10-CM

## 2014-12-25 DIAGNOSIS — I10 Essential (primary) hypertension: Secondary | ICD-10-CM | POA: Diagnosis not present

## 2014-12-25 DIAGNOSIS — I2510 Atherosclerotic heart disease of native coronary artery without angina pectoris: Secondary | ICD-10-CM | POA: Insufficient documentation

## 2014-12-25 DIAGNOSIS — I4510 Unspecified right bundle-branch block: Secondary | ICD-10-CM | POA: Insufficient documentation

## 2014-12-25 DIAGNOSIS — Z8249 Family history of ischemic heart disease and other diseases of the circulatory system: Secondary | ICD-10-CM | POA: Insufficient documentation

## 2014-12-25 DIAGNOSIS — Z87891 Personal history of nicotine dependence: Secondary | ICD-10-CM | POA: Insufficient documentation

## 2014-12-25 DIAGNOSIS — Z9861 Coronary angioplasty status: Secondary | ICD-10-CM | POA: Diagnosis not present

## 2014-12-25 DIAGNOSIS — I252 Old myocardial infarction: Secondary | ICD-10-CM | POA: Diagnosis not present

## 2014-12-25 DIAGNOSIS — E663 Overweight: Secondary | ICD-10-CM | POA: Diagnosis not present

## 2014-12-25 MED ORDER — REGADENOSON 0.4 MG/5ML IV SOLN
0.4000 mg | Freq: Once | INTRAVENOUS | Status: AC
  Administered 2014-12-25: 0.4 mg via INTRAVENOUS

## 2014-12-25 MED ORDER — TECHNETIUM TC 99M SESTAMIBI GENERIC - CARDIOLITE
31.9000 | Freq: Once | INTRAVENOUS | Status: AC | PRN
  Administered 2014-12-25: 31.9 via INTRAVENOUS

## 2014-12-25 MED ORDER — TECHNETIUM TC 99M SESTAMIBI GENERIC - CARDIOLITE
10.6000 | Freq: Once | INTRAVENOUS | Status: AC | PRN
  Administered 2014-12-25: 11 via INTRAVENOUS

## 2014-12-25 MED ORDER — AMINOPHYLLINE 25 MG/ML IV SOLN
75.0000 mg | Freq: Once | INTRAVENOUS | Status: AC
  Administered 2014-12-25: 75 mg via INTRAVENOUS

## 2014-12-25 NOTE — Procedures (Addendum)
[facility] [place] [facility] [address] [place] [address] [phone]  Cardiology Nuclear Med Study  Ronald Kaiser is a 81 y.o. male     MRN : [id number]     DOB: 25-Oct-1933  Procedure Date: 12/25/2014  Nuclear Med Background Indication for Stress Test:  Follow up CAD History:  CAD;MI-04/1999;PTCA-X2;Last NUC MPI on 05/12/2010-fixed apical ant/septal defect;EF49%;RBBB Cardiac Risk Factors: Family History - CAD, History of Smoking, Hypertension, Lipids, Overweight and RBBB  Symptoms:  Pt denies symptoms at this time.   Nuclear Pre-Procedure Caffeine/Decaff Intake:  9:00pm NPO After: 7:00am   IV Site: R Forearm  IV 0.9% NS with Angio Cath:  22g  Chest Size (in):  48" IV Started by: Rolene Course, RN  Height: 6\' 1"  (1.854 m)  Cup Size:n/a  BMI:  Body mass index is 28.37 kg/(m^2). Weight:  215 lb (97.523 kg)   Tech Comments:      Nuclear Med Study 1 or 2 day study: 1 day  Stress Test Type:  [place]  Order Authorizing Provider:  [place] Ruths, MD   Resting Radionuclide: Technetium 35m Sestamibi  Resting Radionuclide Dose: 10.6 mCi   Stress Radionuclide:  Technetium 66m Sestamibi  Stress Radionuclide Dose: 31.9 mCi           Stress Protocol Rest HR: 51 Stress HR: 75  Rest BP: 115/67 Stress BP: 115/67  Exercise Time (min): n/a METS: n/a          Dose of Adenosine (mg):  n/a Dose of Lexiscan: 0.4 mg  Dose of Atropine (mg): n/a Dose of Dobutamine: n/a mcg/kg/min (at max HR)  Stress Test Technologist: Mellody Memos, CCT Nuclear Technologist: Imagene Riches, CNMT   Rest Procedure:  Myocardial perfusion imaging was performed at rest 45 minutes following the intravenous administration of Technetium 46m Sestamibi. Stress Procedure:  The patient received IV Lexiscan 0.4 mg over 15-seconds.  Technetium 56m Sestamibi injected IV at 30-seconds.  Patient experienced a drop in blood pressure and was administered 75 mg of Aminophylline IV at  5 minutes. There were no significant changes with Lexiscan.  Quantitative spect images were obtained after a 45 minute delay.  Transient Ischemic Dilatation (Normal <1.22):  1.16  QGS EDV:  112 ml QGS ESV:  63 ml LV Ejection Fraction: 44%        Rest ECG: NSR-RBBB  Stress ECG: No significant change from baseline ECG  QPS Raw Data Images:  Normal; no motion artifact; normal heart/lung ratio. Stress Images:  There is decreased uptake in the anterior wall. Rest Images:  There is decreased uptake in the anterior wall. Subtraction (SDS):  There is a fixed defect that is most consistent with a previous infarction.  Impression Exercise Capacity:  Lexiscan with no exercise. BP Response:  Hypotensive blood pressure response. Clinical Symptoms:  No significant symptoms noted. ECG Impression:  No significant ST segment change suggestive of ischemia. Comparison with Prior Nuclear Study: Anteroapical / anteroseptal fixed defect with mild superimposed ischemia  Overall Impression:  Low risk stress nuclear study Scar LAD territory with mild superimposed ischemia.  LV Wall Motion:  Moderate LV dysfunction with apical severe hypokinesis, EF 44%   Lorretta Harp, MD  12/26/2014 12:46 PM

## 2014-12-26 LAB — BASIC METABOLIC PANEL WITH GFR
BUN: 19 mg/dL (ref 6–23)
CALCIUM: 8.7 mg/dL (ref 8.4–10.5)
CO2: 26 mEq/L (ref 19–32)
Chloride: 103 mEq/L (ref 96–112)
Creat: 0.89 mg/dL (ref 0.50–1.35)
GFR, Est Non African American: 82 mL/min
Glucose, Bld: 96 mg/dL (ref 70–99)
Potassium: 4.6 mEq/L (ref 3.5–5.3)
Sodium: 139 mEq/L (ref 135–145)

## 2014-12-26 LAB — LIPID PANEL
Cholesterol: 174 mg/dL (ref 0–200)
HDL: 45 mg/dL (ref >=40)
LDL Cholesterol: 94 mg/dL (ref 0–99)
TRIGLYCERIDES: 177 mg/dL — AB (ref <150)
Total CHOL/HDL Ratio: 3.9 Ratio
VLDL: 35 mg/dL (ref 0–40)

## 2014-12-26 LAB — HEPATIC FUNCTION PANEL
ALK PHOS: 44 U/L (ref 39–117)
ALT: 18 U/L (ref 0–53)
AST: 17 U/L (ref 0–37)
Albumin: 4 g/dL (ref 3.5–5.2)
Bilirubin, Direct: 0.1 mg/dL (ref 0.0–0.3)
Indirect Bilirubin: 0.6 mg/dL (ref 0.2–1.2)
TOTAL PROTEIN: 6.2 g/dL (ref 6.0–8.3)
Total Bilirubin: 0.7 mg/dL (ref 0.2–1.2)

## 2015-03-10 DIAGNOSIS — C61 Malignant neoplasm of prostate: Secondary | ICD-10-CM | POA: Diagnosis not present

## 2015-03-16 DIAGNOSIS — C61 Malignant neoplasm of prostate: Secondary | ICD-10-CM | POA: Diagnosis not present

## 2015-06-15 DIAGNOSIS — C61 Malignant neoplasm of prostate: Secondary | ICD-10-CM | POA: Diagnosis not present

## 2015-09-14 DIAGNOSIS — C61 Malignant neoplasm of prostate: Secondary | ICD-10-CM | POA: Diagnosis not present

## 2015-09-21 DIAGNOSIS — C61 Malignant neoplasm of prostate: Secondary | ICD-10-CM | POA: Diagnosis not present

## 2015-11-02 DIAGNOSIS — Z23 Encounter for immunization: Secondary | ICD-10-CM | POA: Diagnosis not present

## 2015-12-18 ENCOUNTER — Other Ambulatory Visit: Payer: Self-pay | Admitting: Cardiology

## 2015-12-18 NOTE — Telephone Encounter (Signed)
Rx request sent to pharmacy.  

## 2015-12-21 DIAGNOSIS — C61 Malignant neoplasm of prostate: Secondary | ICD-10-CM | POA: Diagnosis not present

## 2015-12-25 NOTE — Progress Notes (Signed)
      HPI: FU coronary artery disease. Patient had a myocardial infarction in July of 2000. He was treated with TPA and had stenting of his LAD and PTCA of his second diagonal. He apparently had a cardiac catheterization in 2006 in [facility]. This revealed a normal left main. There was a 40% percent proximal right coronary artery and a 20% LAD. There was mitral valve prolapse with mild mitral regurgitation. Ejection fraction was 63%. Abdominal ultrasound in April of 2014 showed no aneurysm. Nuclear study March 2016 showed ejection fraction 44%. There was an anterior infarct with mild peri-infarct ischemia. Treated medically.Since last seen, There is no dyspnea, chest pain, palpitations or syncope.  Current Outpatient Prescriptions  Medication Sig Dispense Refill  . aspirin 325 MG EC tablet Take 325 mg by mouth daily.    . cholecalciferol (VITAMIN D) 1000 UNITS tablet Take 1,000 Units by mouth daily.    Marland Kitchen lisinopril (PRINIVIL,ZESTRIL) 20 MG tablet TAKE 1 TABLET BY MOUTH ONCE A DAY 90 tablet 0  . metoprolol (LOPRESSOR) 50 MG tablet Take 1 tablet (50 mg total) by mouth 2 (two) times daily. 180 tablet 3  . Multiple Vitamins-Minerals (CENTRUM PO) Take by mouth daily.    . nitroGLYCERIN (NITROSTAT) 0.4 MG SL tablet Place 1 tablet (0.4 mg total) under the tongue every 5 (five) minutes as needed. 50 tablet 12  . Omega-3 Fatty Acids (FISH OIL PO) Take 1 tablet by mouth daily.    . pravastatin (PRAVACHOL) 40 MG tablet Take 1 tablet (40 mg total) by mouth every evening. 90 tablet 3  . SELENIUM PO Take 200 mcg by mouth daily.     No current facility-administered medications for this visit.     Past Medical History  Diagnosis Date  . HTN (hypertension)   . CAD (coronary artery disease)   . Hyperlipidemia   . Obesity   . Prostate cancer [facility])     Past Surgical History  Procedure Laterality Date  . Stenting of lad      Social History   Social History  . Marital Status: Married    Spouse  Name: N/A  . Number of Children: N/A  . Years of Education: N/A   Occupational History  . Not on file.   Social History Main Topics  . Smoking status: Former Research scientist (life sciences)  . Smokeless tobacco: Not on file  . Alcohol Use: Not on file  . Drug Use: Not on file  . Sexual Activity: Not on file   Other Topics Concern  . Not on file   Social History Narrative    History reviewed. No pertinent family history.  ROS: no fevers or chills, productive cough, hemoptysis, dysphasia, odynophagia, melena, hematochezia, dysuria, hematuria, rash, seizure activity, orthopnea, PND, pedal edema, claudication. Remaining systems are negative.  Physical Exam: Well-developed well-nourished in no acute distress.  Skin is warm and dry.  HEENT is normal.  Neck is supple.  Chest is clear to auscultation with normal expansion.  Cardiovascular exam is regular rate and rhythm.  Abdominal exam nontender or distended. No masses palpated. Extremities show no edema. neuro grossly intact  ECG Sinus bradycardia at a rate of 53. Left ventricular hypertrophy. Right bundle branch block. Septal infarct. Lateral infarct.

## 2015-12-31 ENCOUNTER — Ambulatory Visit (INDEPENDENT_AMBULATORY_CARE_PROVIDER_SITE_OTHER): Payer: Medicare Other | Admitting: Cardiology

## 2015-12-31 ENCOUNTER — Encounter: Payer: Self-pay | Admitting: Cardiology

## 2015-12-31 VITALS — BP 104/66 | HR 53 | Ht 73.0 in | Wt 215.0 lb

## 2015-12-31 DIAGNOSIS — I2510 Atherosclerotic heart disease of native coronary artery without angina pectoris: Secondary | ICD-10-CM

## 2015-12-31 DIAGNOSIS — I2583 Coronary atherosclerosis due to lipid rich plaque: Secondary | ICD-10-CM

## 2015-12-31 DIAGNOSIS — E785 Hyperlipidemia, unspecified: Secondary | ICD-10-CM | POA: Diagnosis not present

## 2015-12-31 DIAGNOSIS — I10 Essential (primary) hypertension: Secondary | ICD-10-CM

## 2015-12-31 MED ORDER — NITROGLYCERIN 0.4 MG SL SUBL: 0.4000 mg | SUBLINGUAL_TABLET | SUBLINGUAL | Status: DC | PRN

## 2015-12-31 MED ORDER — PRAVASTATIN SODIUM 40 MG PO TABS: 40.0000 mg | ORAL_TABLET | Freq: Every evening | ORAL | Status: DC

## 2015-12-31 MED ORDER — METOPROLOL TARTRATE 50 MG PO TABS: 50.0000 mg | ORAL_TABLET | Freq: Two times a day (BID) | ORAL | Status: DC

## 2015-12-31 MED ORDER — LISINOPRIL 20 MG PO TABS: 20.0000 mg | ORAL_TABLET | Freq: Every day | ORAL | Status: DC

## 2015-12-31 NOTE — Assessment & Plan Note (Signed)
Blood pressure controlled. Continue present medications. Check potassium and renal function. 

## 2015-12-31 NOTE — Assessment & Plan Note (Signed)
Patient could not tolerate Zocor or Lipitor. He also had aches with Pravachol 40 mg daily but decreased to 20 mg daily which he is tolerating. Check lipids. If LDL greater than 70 will add Zetia.

## 2015-12-31 NOTE — Patient Instructions (Signed)
Medication Instructions:   NO CHANGE  Labwork:  Your physician recommends that you return for lab work SAME DAY AS ECHO AT [place] OFFICE= DO NOT EAT PRIOR TO LAB WORK  Testing/Procedures:  Your physician has requested that you have an echocardiogram. Echocardiography is a painless test that uses sound waves to create images of your heart. It provides your doctor with information about the size and shape of your heart and how well your heart's chambers and valves are working. This procedure takes approximately one hour. There are no restrictions for this procedure.    Follow-Up:  Your physician wants you to follow-up in: [place] will receive a reminder letter in the mail two months in advance. If you don't receive a letter, please call our office to schedule the follow-up appointment.

## 2015-12-31 NOTE — Assessment & Plan Note (Signed)
Continue aspirin and statin. Most recent nuclear study appears low risk. Ejection fraction felt to be 44%. Plan echocardiogram to further assess.

## 2016-01-13 DIAGNOSIS — L821 Other seborrheic keratosis: Secondary | ICD-10-CM | POA: Insufficient documentation

## 2016-01-13 DIAGNOSIS — Z0000 Encounter for general adult medical examination without abnormal findings: Secondary | ICD-10-CM | POA: Insufficient documentation

## 2016-01-13 DIAGNOSIS — I509 Heart failure, unspecified: Secondary | ICD-10-CM | POA: Insufficient documentation

## 2016-01-13 DIAGNOSIS — L570 Actinic keratosis: Secondary | ICD-10-CM | POA: Insufficient documentation

## 2016-01-13 DIAGNOSIS — M1990 Unspecified osteoarthritis, unspecified site: Secondary | ICD-10-CM | POA: Insufficient documentation

## 2016-01-13 DIAGNOSIS — N529 Male erectile dysfunction, unspecified: Secondary | ICD-10-CM | POA: Insufficient documentation

## 2016-01-21 ENCOUNTER — Other Ambulatory Visit (INDEPENDENT_AMBULATORY_CARE_PROVIDER_SITE_OTHER): Payer: Medicare Other | Admitting: *Deleted

## 2016-01-21 ENCOUNTER — Other Ambulatory Visit: Payer: Self-pay

## 2016-01-21 ENCOUNTER — Ambulatory Visit (HOSPITAL_COMMUNITY): Payer: Medicare Other | Attending: Cardiology

## 2016-01-21 DIAGNOSIS — I77810 Thoracic aortic ectasia: Secondary | ICD-10-CM | POA: Diagnosis not present

## 2016-01-21 DIAGNOSIS — Z87891 Personal history of nicotine dependence: Secondary | ICD-10-CM | POA: Diagnosis not present

## 2016-01-21 DIAGNOSIS — I2510 Atherosclerotic heart disease of native coronary artery without angina pectoris: Secondary | ICD-10-CM | POA: Diagnosis not present

## 2016-01-21 DIAGNOSIS — E785 Hyperlipidemia, unspecified: Secondary | ICD-10-CM

## 2016-01-21 DIAGNOSIS — I071 Rheumatic tricuspid insufficiency: Secondary | ICD-10-CM | POA: Insufficient documentation

## 2016-01-21 DIAGNOSIS — I351 Nonrheumatic aortic (valve) insufficiency: Secondary | ICD-10-CM | POA: Diagnosis not present

## 2016-01-21 DIAGNOSIS — I119 Hypertensive heart disease without heart failure: Secondary | ICD-10-CM | POA: Diagnosis not present

## 2016-01-21 DIAGNOSIS — I340 Nonrheumatic mitral (valve) insufficiency: Secondary | ICD-10-CM | POA: Insufficient documentation

## 2016-01-21 LAB — CBC WITH DIFFERENTIAL/PLATELET
BASOS ABS: 46 {cells}/uL (ref 0–200)
Basophils Relative: 1 %
EOS ABS: 138 {cells}/uL (ref 15–500)
EOS PCT: 3 %
HCT: 41.4 % (ref 38.5–50.0)
HEMOGLOBIN: 14.1 g/dL (ref 13.2–17.1)
Lymphocytes Relative: 31 %
Lymphs Abs: 1426 cells/uL (ref 850–3900)
MCH: 31.8 pg (ref 27.0–33.0)
MCHC: 34.1 g/dL (ref 32.0–36.0)
MCV: 93.2 fL (ref 80.0–100.0)
MONOS PCT: 9 %
MPV: 10.1 fL (ref 7.5–12.5)
Monocytes Absolute: 414 cells/uL (ref 200–950)
NEUTROS PCT: 56 %
Neutro Abs: 2576 cells/uL (ref 1500–7800)
PLATELETS: 211 10*3/uL (ref 140–400)
RBC: 4.44 MIL/uL (ref 4.20–5.80)
RDW: 13.4 % (ref 11.0–15.0)
WBC: 4.6 10*3/uL (ref 3.8–10.8)

## 2016-01-21 LAB — HEPATIC FUNCTION PANEL
ALK PHOS: 41 U/L (ref 40–115)
ALT: 17 U/L (ref 9–46)
AST: 18 U/L (ref 10–35)
Albumin: 3.9 g/dL (ref 3.6–5.1)
BILIRUBIN DIRECT: 0.1 mg/dL (ref <=0.2)
BILIRUBIN INDIRECT: 0.6 mg/dL (ref 0.2–1.2)
BILIRUBIN TOTAL: 0.7 mg/dL (ref 0.2–1.2)
Total Protein: 6.1 g/dL (ref 6.1–8.1)

## 2016-01-21 LAB — LIPID PANEL
Cholesterol: 186 mg/dL (ref 125–200)
HDL: 48 mg/dL (ref >=40)
LDL CALC: 107 mg/dL (ref <130)
Total CHOL/HDL Ratio: 3.9 Ratio (ref <=5.0)
Triglycerides: 157 mg/dL — ABNORMAL HIGH (ref <150)
VLDL: 31 mg/dL — ABNORMAL HIGH (ref <30)

## 2016-01-21 LAB — BASIC METABOLIC PANEL
BUN: 21 mg/dL (ref 7–25)
CALCIUM: 8.6 mg/dL (ref 8.6–10.3)
CHLORIDE: 103 mmol/L (ref 98–110)
CO2: 25 mmol/L (ref 20–31)
CREATININE: 0.88 mg/dL (ref 0.70–1.18)
GLUCOSE: 91 mg/dL (ref 65–99)
Potassium: 4.3 mmol/L (ref 3.5–5.3)
Sodium: 137 mmol/L (ref 135–146)

## 2016-01-21 NOTE — Addendum Note (Signed)
Addended by: Eulis Foster on: 01/21/2016 10:28 AM   Modules accepted: Orders

## 2016-02-24 ENCOUNTER — Telehealth: Payer: Self-pay | Admitting: *Deleted

## 2016-02-24 DIAGNOSIS — I2510 Atherosclerotic heart disease of native coronary artery without angina pectoris: Secondary | ICD-10-CM

## 2016-02-24 DIAGNOSIS — R931 Abnormal findings on diagnostic imaging of heart and coronary circulation: Secondary | ICD-10-CM

## 2016-02-24 NOTE — Telephone Encounter (Signed)
Spoke with pt, lexiscan scheduled and ov with dr Stanford Breed.

## 2016-02-24 NOTE — Telephone Encounter (Signed)
-----   Message from Lelon Perla, MD sent at 02/23/2016  1:08 PM EDT ----- Schedule fuov; LV fx worse; needs med titration; repeat lexiscan nuclear study [place]

## 2016-02-26 ENCOUNTER — Telehealth (HOSPITAL_COMMUNITY): Payer: Self-pay

## 2016-02-26 NOTE — Telephone Encounter (Signed)
Encounter complete. 

## 2016-03-02 ENCOUNTER — Ambulatory Visit (HOSPITAL_COMMUNITY)
Admission: RE | Admit: 2016-03-02 | Discharge: 2016-03-02 | Disposition: A | Discharge status: Home / Self Care | Payer: Medicare Other | Source: Ambulatory Visit | Attending: Cardiovascular Disease | Admitting: Cardiovascular Disease

## 2016-03-02 DIAGNOSIS — I4510 Unspecified right bundle-branch block: Secondary | ICD-10-CM | POA: Insufficient documentation

## 2016-03-02 DIAGNOSIS — I2510 Atherosclerotic heart disease of native coronary artery without angina pectoris: Secondary | ICD-10-CM | POA: Insufficient documentation

## 2016-03-02 DIAGNOSIS — I119 Hypertensive heart disease without heart failure: Secondary | ICD-10-CM | POA: Insufficient documentation

## 2016-03-02 DIAGNOSIS — R9439 Abnormal result of other cardiovascular function study: Secondary | ICD-10-CM | POA: Diagnosis not present

## 2016-03-02 DIAGNOSIS — R931 Abnormal findings on diagnostic imaging of heart and coronary circulation: Secondary | ICD-10-CM | POA: Insufficient documentation

## 2016-03-02 DIAGNOSIS — R0989 Other specified symptoms and signs involving the circulatory and respiratory systems: Secondary | ICD-10-CM | POA: Diagnosis not present

## 2016-03-02 DIAGNOSIS — Z87891 Personal history of nicotine dependence: Secondary | ICD-10-CM | POA: Diagnosis not present

## 2016-03-02 DIAGNOSIS — Z8249 Family history of ischemic heart disease and other diseases of the circulatory system: Secondary | ICD-10-CM | POA: Insufficient documentation

## 2016-03-02 LAB — MYOCARDIAL PERFUSION IMAGING
CHL CUP NUCLEAR SSS: 11
CSEPPHR: 74 {beats}/min
LVDIAVOL: 106 mL (ref 62–150)
LVSYSVOL: 62 mL
NUC STRESS TID: 1.01
Rest HR: 52 {beats}/min
SDS: 5
SRS: 6

## 2016-03-02 MED ORDER — REGADENOSON 0.4 MG/5ML IV SOLN
0.4000 mg | Freq: Once | INTRAVENOUS | Status: AC
  Administered 2016-03-02: 0.4 mg via INTRAVENOUS

## 2016-03-02 MED ORDER — TECHNETIUM TC 99M TETROFOSMIN IV KIT
10.2000 | PACK | Freq: Once | INTRAVENOUS | Status: AC | PRN
  Administered 2016-03-02: 10 via INTRAVENOUS
  Filled 2016-03-02: qty 10

## 2016-03-02 MED ORDER — TECHNETIUM TC 99M TETROFOSMIN IV KIT
30.4000 | PACK | Freq: Once | INTRAVENOUS | Status: AC | PRN
  Administered 2016-03-02: 30 via INTRAVENOUS
  Filled 2016-03-02: qty 30

## 2016-03-03 ENCOUNTER — Encounter: Payer: Self-pay | Admitting: Cardiology

## 2016-03-03 NOTE — Telephone Encounter (Signed)
New message   Pt is returning call for stress test

## 2016-03-03 NOTE — Telephone Encounter (Signed)
This encounter was created in error - please disregard.

## 2016-03-07 ENCOUNTER — Other Ambulatory Visit: Payer: Self-pay | Admitting: Nurse Practitioner

## 2016-03-07 ENCOUNTER — Ambulatory Visit (INDEPENDENT_AMBULATORY_CARE_PROVIDER_SITE_OTHER): Payer: Medicare Other | Admitting: Nurse Practitioner

## 2016-03-07 ENCOUNTER — Encounter: Payer: Self-pay | Admitting: Nurse Practitioner

## 2016-03-07 ENCOUNTER — Ambulatory Visit
Admission: RE | Admit: 2016-03-07 | Discharge: 2016-03-07 | Disposition: A | Discharge status: Home / Self Care | Payer: Medicare Other | Source: Ambulatory Visit | Attending: Nurse Practitioner | Admitting: Nurse Practitioner

## 2016-03-07 VITALS — BP 108/70 | HR 52 | Ht 73.0 in | Wt 208.4 lb

## 2016-03-07 DIAGNOSIS — R9439 Abnormal result of other cardiovascular function study: Secondary | ICD-10-CM

## 2016-03-07 DIAGNOSIS — I10 Essential (primary) hypertension: Secondary | ICD-10-CM

## 2016-03-07 DIAGNOSIS — Z01810 Encounter for preprocedural cardiovascular examination: Secondary | ICD-10-CM | POA: Diagnosis not present

## 2016-03-07 DIAGNOSIS — E785 Hyperlipidemia, unspecified: Secondary | ICD-10-CM

## 2016-03-07 DIAGNOSIS — R0789 Other chest pain: Secondary | ICD-10-CM

## 2016-03-07 DIAGNOSIS — I2510 Atherosclerotic heart disease of native coronary artery without angina pectoris: Secondary | ICD-10-CM

## 2016-03-07 LAB — BASIC METABOLIC PANEL
BUN: 22 mg/dL (ref 7–25)
CO2: 25 mmol/L (ref 20–31)
Calcium: 8.9 mg/dL (ref 8.6–10.3)
Chloride: 101 mmol/L (ref 98–110)
Creat: 0.86 mg/dL (ref 0.70–1.18)
Glucose, Bld: 105 mg/dL — ABNORMAL HIGH (ref 65–99)
Potassium: 4.8 mmol/L (ref 3.5–5.3)
Sodium: 136 mmol/L (ref 135–146)

## 2016-03-07 LAB — CBC
HCT: 41.8 % (ref 38.5–50.0)
Hemoglobin: 14.1 g/dL (ref 13.2–17.1)
MCH: 31.8 pg (ref 27.0–33.0)
MCHC: 33.7 g/dL (ref 32.0–36.0)
MCV: 94.1 fL (ref 80.0–100.0)
MPV: 9.9 fL (ref 7.5–12.5)
Platelets: 224 10*3/uL (ref 140–400)
RBC: 4.44 MIL/uL (ref 4.20–5.80)
RDW: 13.6 % (ref 11.0–15.0)
WBC: 5.3 10*3/uL (ref 3.8–10.8)

## 2016-03-07 LAB — APTT: aPTT: 28 seconds (ref 24–37)

## 2016-03-07 LAB — PROTIME-INR
INR: 1 (ref <1.50)
Prothrombin Time: 13.3 seconds (ref 11.6–15.2)

## 2016-03-07 NOTE — Progress Notes (Signed)
CARDIOLOGY OFFICE NOTE  Date:  03/07/2016    Ronald Kaiser Date of Birth: [birth year] Medical Record [id number]  PCP:  Woody Seller, MD  Cardiologist:  Stanford Breed    Chief Complaint  Patient presents with  . Coronary Artery Disease  . Congestive Heart Failure    Post Myoview study - seen for Dr. Stanford Breed    History of Present Illness: Ronald Kaiser is a 82 y.o. male who presents today for a work in visit. Seen for Dr. Stanford Breed.   He has known CAD. Patient had a myocardial infarction in July of 2000. He was treated with TPA and had stenting of his LAD and PTCA of his second diagonal. He apparently had a cardiac catheterization in 2006 in [facility]. This revealed a normal left main. There was a 40% percent proximal right coronary artery and a 20% LAD. There was mitral valve prolapse with mild mitral regurgitation. Ejection fraction was 63%. Abdominal ultrasound in April of 2014 showed no aneurysm. Nuclear study March 2016 showed ejection fraction 44%. There was an anterior infarct with mild peri-infarct ischemia. Treated medically.    Seen back at the end of March - was doing well clinically. Echo updated - EF worse - referred for Myoview - this is read high risk.   Comes back today. Here with Ronald Kaiser - I have seen them both in the past. Ronald Kaiser feels fine. No chest pain. Not short of breath. He does not really exercise but tries to stay active. Energy level is pretty good. Weight stable. No swelling. He is pretty happy with how he is doing. He did have mild chest pain with his MI 17 years ago.   Past Medical History  Diagnosis Date  . HTN (hypertension)   . CAD (coronary artery disease)   . Hyperlipidemia   . Obesity   . Prostate cancer [facility])     Past Surgical History  Procedure Laterality Date  . Stenting of lad       Medications: Current Outpatient Prescriptions  Medication Sig Dispense Refill  . aspirin 325 MG EC tablet Take 325 mg by mouth daily.    .  cholecalciferol (VITAMIN D) 1000 UNITS tablet Take 1,000 Units by mouth daily.    Marland Kitchen lisinopril (PRINIVIL,ZESTRIL) 20 MG tablet Take 1 tablet (20 mg total) by mouth daily. 90 tablet 3  . metoprolol (LOPRESSOR) 50 MG tablet Take 1 tablet (50 mg total) by mouth 2 (two) times daily. 180 tablet 3  . Multiple Vitamins-Minerals (CENTRUM PO) Take by mouth daily.    . nitroGLYCERIN (NITROSTAT) 0.4 MG SL tablet Place 1 tablet (0.4 mg total) under the tongue every 5 (five) minutes as needed. 25 tablet 12  . Omega-3 Fatty Acids (FISH OIL PO) Take 1 tablet by mouth daily.    . pravastatin (PRAVACHOL) 40 MG tablet Take 1 tablet (40 mg total) by mouth every evening. 90 tablet 3  . SELENIUM PO Take 200 mcg by mouth daily.     No current facility-administered medications for this visit.    Allergies: No Known Allergies  Social History: The patient  reports that he has quit smoking. He does not have any smokeless tobacco history on file.   Family History: The patient's family history is not on file.   Review of Systems: Please see the history of present illness.   Otherwise, the review of systems is positive for none.   All other systems are reviewed and negative.   Physical Exam: VS:  BP  108/70 mmHg  Pulse 52  Ht 6\' 1"  (1.854 m)  Wt 208 lb 6.4 oz (94.53 kg)  BMI 27.50 kg/m2 .  BMI Body mass index is 27.5 kg/(m^2).  Wt Readings from Last 3 Encounters:  03/07/16 208 lb 6.4 oz (94.53 kg)  03/02/16 215 lb (97.523 kg)  12/31/15 215 lb (97.523 kg)    General: Pleasant. Well developed, well nourished and in no acute distress.  HEENT: Normal. Neck: Supple, no JVD, carotid bruits, or masses noted.  Cardiac: Regular rate and rhythm. No murmurs, rubs, or gallops. No edema.  Respiratory:  Lungs are clear to auscultation bilaterally with normal work of breathing.  GI: Soft and nontender.  MS: No deformity or atrophy. Gait and ROM intact. Skin: Warm and dry. Color is normal.  Neuro:  Strength and  sensation are intact and no gross focal deficits noted.  Psych: Alert, appropriate and with normal affect.   LABORATORY DATA:  EKG:  EKG is not ordered today.  Lab Results  Component Value Date   WBC 4.6 01/21/2016   HGB 14.1 01/21/2016   HCT 41.4 01/21/2016   PLT 211 01/21/2016   GLUCOSE 91 01/21/2016   CHOL 186 01/21/2016   TRIG 157* 01/21/2016   HDL 48 01/21/2016   LDLCALC 107 01/21/2016   ALT 17 01/21/2016   AST 18 01/21/2016   NA 137 01/21/2016   K 4.3 01/21/2016   CL 103 01/21/2016   CREATININE 0.88 01/21/2016   BUN 21 01/21/2016   CO2 25 01/21/2016    BNP (last 3 results) No results for input(s): BNP in the last 8760 hours.  ProBNP (last 3 results) No results for input(s): PROBNP in the last 8760 hours.   Other Studies Reviewed Today:  Myoview Study Highlights from 02/2016     Nuclear stress EF: 42% with mid and distal anterior, anteroseptal and apical akinesis  The left ventricular ejection fraction is moderately decreased (30-44%).  There was no ST segment deviation noted during stress.  There is a small defect of moderate severity present in the basal inferoseptal and mid inferoseptal location. The defect is reversible and consistent with ischemia.  There is a large defect of severe severity present in the mid anterior, mid anteroseptal, apical anterior, apical septal and apex location. The defect is partially reversible and consistent with infarct and peri infarct ischemia.  This is a high risk study.   Echo Study Conclusions from 01/2016  - Left ventricle: The cavity size was normal. Wall thickness was  normal. Systolic function was moderately to severely reduced. The  estimated ejection fraction was in the range of 30% to 35%. There  is severe hypokinesis of the anteroseptal and apical myocardium.  Doppler parameters are consistent with abnormal left ventricular  relaxation (grade 1 diastolic dysfunction). - Aortic valve: There was mild  regurgitation. - Aortic root: The aortic root was mildly dilated. - Mitral valve: There was mild regurgitation.  Impressions:  - Severe hypokinesis of the anteroseptal wall and apex; overall  moderate to severe LV dysfunction; grade 1 diastolic dysfunction;  mild AI and MR; trace TR.  Assessment/Plan: 1. High risk Myoview - cardiac catheterization has been recommended. The patient understands that risks include but are not limited to stroke (1 in 1000), death (1 in 39), kidney failure [usually temporary] (1 in 500), bleeding (1 in 200), allergic reaction [possibly serious] (1 in 200), and agrees to proceed.   2. Worsening LV function -  No active heart failure. Proceeding on with  cardiac cath.   3. Known CAD - with remote stent - last cath from 2006  4. Prostate cancer  Current medicines are reviewed with the patient today.  The patient does not have concerns regarding medicines other than what has been noted above.  The following changes have been made:  See above.  Labs/ tests ordered today include:    Orders Placed This Encounter  Procedures  . DG Chest 2 View  . Basic metabolic panel  . CBC  . Protime-INR  . APTT     Disposition:   Further disposition to follow.    Patient is agreeable to this plan and will call if any problems develop in the interim.   Signed: Burtis Junes, RN, ANP-C 03/07/2016 11:11 AM  [facility] [address] [place], [place]  [address] Phone: [phone] Fax: [phone]

## 2016-03-07 NOTE — Patient Instructions (Addendum)
We will be checking the following labs today - BMET, CBC, PT, PTT  Please go to [facility] to [place] on the first floor for a chest Xray - you may walk in.     Medication Instructions:    Continue with your current medicines.     Testing/Procedures To Be Arranged:  Cardiac Catheterization    Other Special Instructions:  Your provider has recommended a cardiac catherization  You are scheduled for a cardiac catheterization on Wednesday, June 7th at 11:30 Am with Dr. Burt Knack or associate.  Go to [facility] 2nd Floor Short Stay on Wednesday, June 7th at 9:30AM  Enter thru the [employer] entrance A No food or drink after midnight on Tuesday. You may take your medications with a sip of water on the day of your procedure.   Coronary Angiogram A coronary angiogram, also called coronary angiography, is an X-ray procedure used to look at the arteries in the heart. In this procedure, a dye (contrast dye) is injected through a long, hollow tube (catheter). The catheter is about the size of a piece of cooked spaghetti and is inserted through your groin, wrist, or arm. The dye is injected into each artery, and X-rays are then taken to show if there is a blockage in the arteries of your heart.  LET [facility] CARE PROVIDER KNOW ABOUT: Any allergies you have, including allergies to shellfish or contrast dye.  All medicines you are taking, including vitamins, herbs, eye drops, creams, and over-the-counter medicines.  Previous problems you or members of your family have had with the use of anesthetics.  Any blood disorders you have.  Previous surgeries you have had. History of kidney problems or failure.  Other medical conditions you have.  RISKS AND COMPLICATIONS  Generally, a coronary angiogram is a safe procedure. However, about 1 person out of 1000 can have problems that may include: Allergic reaction to the dye. Bleeding/bruising from the access site or other  locations. Kidney injury, especially in people with impaired kidney function. Stroke (rare). Heart attack (rare). Irregular rhythms (rare) Death (rare)  BEFORE THE PROCEDURE  Do not eat or drink anything after midnight the night before the procedure or as directed by your health care provider.  Ask your health care provider about changing or stopping your regular medicines. This is especially important if you are taking diabetes medicines or blood thinners.  PROCEDURE You may be given a medicine to help you relax (sedative) before the procedure. This medicine is given through an intravenous (IV) access tube that is inserted into one of your veins.  The area where the catheter will be inserted will be washed and shaved. This is usually done in the groin but may be done in the fold of your arm (near your elbow) or in the wrist.  A medicine will be given to numb the area where the catheter will be inserted (local anesthetic).  The health care provider will insert the catheter into an artery. The catheter will be guided by using a special type of X-ray (fluoroscopy) of the blood vessel being examined.  A special dye will then be injected into the catheter, and X-rays will be taken. The dye will help to show where any narrowing or blockages are located in the heart arteries.    AFTER THE PROCEDURE  If the procedure is done through the leg, you will be kept in bed lying flat for several hours. You will be instructed to not bend or cross  your legs. The insertion site will be checked frequently.  The pulse in your feet or wrist will be checked frequently.  Additional blood tests, X-rays, and an electrocardiogram may be done.     If you need a refill on your cardiac medications before your next appointment, please call your pharmacy.   Call the [place] office at [phone] if you have any questions, problems or concerns.

## 2016-03-09 ENCOUNTER — Ambulatory Visit (HOSPITAL_COMMUNITY)
Admission: RE | Admit: 2016-03-09 | Discharge: 2016-03-09 | Disposition: A | Discharge status: Home / Self Care | Payer: Medicare Other | Source: Ambulatory Visit | Attending: Cardiovascular Disease | Admitting: Cardiovascular Disease

## 2016-03-09 ENCOUNTER — Encounter (HOSPITAL_COMMUNITY)
Admission: RE | Disposition: A | Discharge status: Home / Self Care | Payer: Self-pay | Source: Ambulatory Visit | Attending: Cardiovascular Disease

## 2016-03-09 DIAGNOSIS — I5042 Chronic combined systolic (congestive) and diastolic (congestive) heart failure: Secondary | ICD-10-CM | POA: Diagnosis not present

## 2016-03-09 DIAGNOSIS — T82855A Stenosis of coronary artery stent, initial encounter: Secondary | ICD-10-CM | POA: Insufficient documentation

## 2016-03-09 DIAGNOSIS — I252 Old myocardial infarction: Secondary | ICD-10-CM | POA: Diagnosis not present

## 2016-03-09 DIAGNOSIS — I340 Nonrheumatic mitral (valve) insufficiency: Secondary | ICD-10-CM | POA: Insufficient documentation

## 2016-03-09 DIAGNOSIS — R9439 Abnormal result of other cardiovascular function study: Secondary | ICD-10-CM | POA: Diagnosis present

## 2016-03-09 DIAGNOSIS — I110 Hypertensive heart disease with heart failure: Secondary | ICD-10-CM | POA: Diagnosis not present

## 2016-03-09 DIAGNOSIS — Z87891 Personal history of nicotine dependence: Secondary | ICD-10-CM | POA: Insufficient documentation

## 2016-03-09 DIAGNOSIS — C61 Malignant neoplasm of prostate: Secondary | ICD-10-CM | POA: Diagnosis not present

## 2016-03-09 DIAGNOSIS — I2510 Atherosclerotic heart disease of native coronary artery without angina pectoris: Secondary | ICD-10-CM | POA: Diagnosis not present

## 2016-03-09 DIAGNOSIS — R931 Abnormal findings on diagnostic imaging of heart and coronary circulation: Secondary | ICD-10-CM | POA: Insufficient documentation

## 2016-03-09 DIAGNOSIS — Y712 Prosthetic and other implants, materials and accessory cardiovascular devices associated with adverse incidents: Secondary | ICD-10-CM | POA: Diagnosis not present

## 2016-03-09 DIAGNOSIS — Z6827 Body mass index (BMI) 27.0-27.9, adult: Secondary | ICD-10-CM | POA: Insufficient documentation

## 2016-03-09 DIAGNOSIS — Z7982 Long term (current) use of aspirin: Secondary | ICD-10-CM | POA: Insufficient documentation

## 2016-03-09 DIAGNOSIS — E785 Hyperlipidemia, unspecified: Secondary | ICD-10-CM | POA: Diagnosis not present

## 2016-03-09 DIAGNOSIS — E669 Obesity, unspecified: Secondary | ICD-10-CM | POA: Insufficient documentation

## 2016-03-09 DIAGNOSIS — I10 Essential (primary) hypertension: Secondary | ICD-10-CM | POA: Diagnosis present

## 2016-03-09 DIAGNOSIS — R0989 Other specified symptoms and signs involving the circulatory and respiratory systems: Secondary | ICD-10-CM | POA: Diagnosis present

## 2016-03-09 HISTORY — PX: CARDIAC CATHETERIZATION: SHX172

## 2016-03-09 SURGERY — LEFT HEART CATH AND CORONARY ANGIOGRAPHY

## 2016-03-09 MED ORDER — MIDAZOLAM HCL 2 MG/2ML IJ SOLN
INTRAMUSCULAR | Status: AC
  Filled 2016-03-09: qty 2

## 2016-03-09 MED ORDER — IOHEXOL 350 MG/ML SOLN
INTRAVENOUS | Status: DC | PRN
  Administered 2016-03-09: 70 mL via INTRAVENOUS

## 2016-03-09 MED ORDER — VERAPAMIL HCL 2.5 MG/ML IV SOLN
INTRAVENOUS | Status: AC
  Filled 2016-03-09: qty 2

## 2016-03-09 MED ORDER — HEPARIN SODIUM (PORCINE) 1000 UNIT/ML IJ SOLN
INTRAMUSCULAR | Status: AC
  Filled 2016-03-09: qty 1

## 2016-03-09 MED ORDER — ASPIRIN 81 MG PO CHEW: 81.0000 mg | CHEWABLE_TABLET | ORAL | Status: DC

## 2016-03-09 MED ORDER — SODIUM CHLORIDE 0.9 % WEIGHT BASED INFUSION: 1.0000 mL/kg/h | INTRAVENOUS | Status: AC

## 2016-03-09 MED ORDER — LIDOCAINE HCL (PF) 1 % IJ SOLN
INTRAMUSCULAR | Status: DC | PRN
  Administered 2016-03-09: 3 mL via INTRADERMAL

## 2016-03-09 MED ORDER — ACETAMINOPHEN 325 MG PO TABS: 650.0000 mg | ORAL_TABLET | ORAL | Status: DC | PRN

## 2016-03-09 MED ORDER — SODIUM CHLORIDE 0.9% FLUSH: 3.0000 mL | INTRAVENOUS | Status: DC | PRN

## 2016-03-09 MED ORDER — SODIUM CHLORIDE 0.9% FLUSH: 3.0000 mL | Freq: Two times a day (BID) | INTRAVENOUS | Status: DC

## 2016-03-09 MED ORDER — FENTANYL CITRATE (PF) 100 MCG/2ML IJ SOLN
INTRAMUSCULAR | Status: DC | PRN
  Administered 2016-03-09: 50 ug via INTRAVENOUS

## 2016-03-09 MED ORDER — ONDANSETRON HCL 4 MG/2ML IJ SOLN: 4.0000 mg | Freq: Four times a day (QID) | INTRAMUSCULAR | Status: DC | PRN

## 2016-03-09 MED ORDER — DIAZEPAM 5 MG PO TABS: 10.0000 mg | ORAL_TABLET | ORAL | Status: DC

## 2016-03-09 MED ORDER — ASPIRIN 81 MG PO CHEW: 81.0000 mg | CHEWABLE_TABLET | Freq: Every day | ORAL | Status: DC

## 2016-03-09 MED ORDER — SODIUM CHLORIDE 0.9 % IV SOLN: 250.0000 mL | INTRAVENOUS | Status: DC | PRN

## 2016-03-09 MED ORDER — OXYCODONE-ACETAMINOPHEN 5-325 MG PO TABS: 1.0000 | ORAL_TABLET | ORAL | Status: DC | PRN

## 2016-03-09 MED ORDER — HEPARIN SODIUM (PORCINE) 1000 UNIT/ML IJ SOLN
INTRAMUSCULAR | Status: DC | PRN
  Administered 2016-03-09: 5000 [IU] via INTRAVENOUS

## 2016-03-09 MED ORDER — VERAPAMIL HCL 2.5 MG/ML IV SOLN
INTRAVENOUS | Status: DC | PRN
  Administered 2016-03-09: 12:00:00 via INTRA_ARTERIAL

## 2016-03-09 MED ORDER — MIDAZOLAM HCL 2 MG/2ML IJ SOLN
INTRAMUSCULAR | Status: DC | PRN
  Administered 2016-03-09: 1 mg via INTRAVENOUS

## 2016-03-09 MED ORDER — SODIUM CHLORIDE 0.9 % IV SOLN
INTRAVENOUS | Status: DC
  Administered 2016-03-09: 11:00:00 via INTRAVENOUS

## 2016-03-09 MED ORDER — FENTANYL CITRATE (PF) 100 MCG/2ML IJ SOLN
INTRAMUSCULAR | Status: AC
  Filled 2016-03-09: qty 2

## 2016-03-09 MED ORDER — IOPAMIDOL (ISOVUE-370) INJECTION 76%
INTRAVENOUS | Status: AC
  Filled 2016-03-09: qty 100

## 2016-03-09 SURGICAL SUPPLY — 10 items
CATH INFINITI 5 FR JL3.5 (CATHETERS) ×3 IMPLANT
CATH INFINITI JR4 5F (CATHETERS) ×3 IMPLANT
DEVICE RAD COMP TR BAND LRG (VASCULAR PRODUCTS) ×3 IMPLANT
GLIDESHEATH SLEND A-KIT 6F 22G (SHEATH) ×3 IMPLANT
KIT HEART LEFT (KITS) ×3 IMPLANT
PACK CARDIAC CATHETERIZATION (CUSTOM PROCEDURE TRAY) ×3 IMPLANT
TRANSDUCER W/STOPCOCK (MISCELLANEOUS) ×3 IMPLANT
TUBING CIL FLEX 10 FLL-RA (TUBING) ×3 IMPLANT
WIRE HI TORQ VERSACORE-J 145CM (WIRE) ×3 IMPLANT
WIRE SAFE-T 1.5MM-J .035X260CM (WIRE) ×3 IMPLANT

## 2016-03-09 NOTE — Research (Signed)
[employer] FREE Research Study Informed Consent   Subject Name: Ronald Kaiser  Subject met inclusion and exclusion criteria.  The informed consent form, study requirements and expectations were reviewed with the subject and questions and concerns were addressed prior to the signing of the consent form.  The subject verbalized understanding of the trial requirements.  The subject agreed to participate in the trial and signed the informed consent.  The informed consent was obtained prior to performance of any protocol-specific procedures for the subject.  A copy of the signed informed consent was given to the subject and a copy was placed in the subject's medical record.  Blossom Hoops 03/09/2016, 11:34 AM

## 2016-03-09 NOTE — Discharge Instructions (Signed)
Radial Site Care °Refer to this sheet in the next few weeks. These instructions provide you with information about caring for yourself after your procedure. Your health care provider may also give you more specific instructions. Your treatment has been planned according to current medical practices, but problems sometimes occur. Call your health care provider if you have any problems or questions after your procedure. °WHAT TO EXPECT AFTER THE PROCEDURE °After your procedure, it is typical to have the following: °· Bruising at the radial site that usually fades within 1-2 weeks. °· Blood collecting in the tissue (hematoma) that may be painful to the touch. It should usually decrease in size and tenderness within 1-2 weeks. °HOME CARE INSTRUCTIONS °· Take medicines only as directed by your health care provider. °· You may shower 24-48 hours after the procedure or as directed by your health care provider. Remove the bandage (dressing) and gently wash the site with plain soap and water. Pat the area dry with a clean towel. Do not rub the site, because this may cause bleeding. °· Do not take baths, swim, or use a hot tub until your health care provider approves. °· Check your insertion site every day for redness, swelling, or drainage. °· Do not apply powder or lotion to the site. °· Do not flex or bend the affected arm for 24 hours or as directed by your health care provider. °· Do not push or pull heavy objects with the affected arm for 24 hours or as directed by your health care provider. °· Do not lift over 10 lb (4.5 kg) for 5 days after your procedure or as directed by your health care provider. °· Ask your health care provider when it is okay to: °¨ Return to work or school. °¨ Resume usual physical activities or sports. °¨ Resume sexual activity. °· Do not drive home if you are discharged the same day as the procedure. Have someone else drive you. °· You may drive 24 hours after the procedure unless otherwise  instructed by your health care provider. °· Do not operate machinery or power tools for 24 hours after the procedure. °· If your procedure was done as an outpatient procedure, which means that you went home the same day as your procedure, a responsible adult should be with you for the first 24 hours after you arrive home. °· Keep all follow-up visits as directed by your health care provider. This is important. °SEEK MEDICAL CARE IF: °· You have a fever. °· You have chills. °· You have increased bleeding from the radial site. Hold pressure on the site. °SEEK IMMEDIATE MEDICAL CARE IF: °· You have unusual pain at the radial site. °· You have redness, warmth, or swelling at the radial site. °· You have drainage (other than a small amount of blood on the dressing) from the radial site. °· The radial site is bleeding, hold steady pressure on the site and call 911. °· Your arm or hand becomes pale, cool, tingly, or numb. °  °This information is not intended to replace advice given to you by your health care provider. Make sure you discuss any questions you have with your health care provider. °  °Document Released: 10/22/2010 Document Revised: 10/10/2014 Document Reviewed: 04/07/2014 °Elsevier Interactive Patient Education ©2016 [employer] ° °

## 2016-03-09 NOTE — Interval H&P Note (Signed)
Cath Lab Visit (complete for each Cath Lab visit)  Clinical Evaluation Leading to the Procedure:   ACS: No.  Non-ACS:    Anginal Classification: CCS II  Anti-ischemic medical therapy: Maximal Therapy (2 or more classes of medications)  Non-Invasive Test Results: High-risk stress test findings: cardiac mortality >3%/year  Prior CABG: No previous CABG      History and Physical Interval Note:  03/09/2016 11:34 AM  Ronald Kaiser  has presented today for surgery, with the diagnosis of abnormal myoview  The various methods of treatment have been discussed with the patient and family. After consideration of risks, benefits and other options for treatment, the patient has consented to  Procedure(s): Left Heart Cath and Coronary Angiography (N/A) as a surgical intervention .  The patient's history has been reviewed, patient examined, no change in status, stable for surgery.  I have reviewed the patient's chart and labs.  Questions were answered to the patient's satisfaction.     Belva Crome III

## 2016-03-09 NOTE — H&P (View-Only) (Signed)
CARDIOLOGY OFFICE NOTE  Date:  03/07/2016    Ronald Kaiser Date of Birth: [birth year] Medical Record [id number]  PCP:  Woody Seller, MD  Cardiologist:  Stanford Breed    Chief Complaint  Patient presents with  . Coronary Artery Disease  . Congestive Heart Failure    Post Myoview study - seen for Dr. Stanford Breed    History of Present Illness: Ronald Kaiser is a 82 y.o. male who presents today for a work in visit. Seen for Dr. Stanford Breed.   He has known CAD. Patient had a myocardial infarction in July of 2000. He was treated with TPA and had stenting of his LAD and PTCA of his second diagonal. He apparently had a cardiac catheterization in 2006 in [facility]. This revealed a normal left main. There was a 40% percent proximal right coronary artery and a 20% LAD. There was mitral valve prolapse with mild mitral regurgitation. Ejection fraction was 63%. Abdominal ultrasound in April of 2014 showed no aneurysm. Nuclear study March 2016 showed ejection fraction 44%. There was an anterior infarct with mild peri-infarct ischemia. Treated medically.    Seen back at the end of March - was doing well clinically. Echo updated - EF worse - referred for Myoview - this is read high risk.   Comes back today. Here with Margaretha Sheffield - I have seen them both in the past. Davyn feels fine. No chest pain. Not short of breath. He does not really exercise but tries to stay active. Energy level is pretty good. Weight stable. No swelling. He is pretty happy with how he is doing. He did have mild chest pain with his MI 17 years ago.   Past Medical History  Diagnosis Date  . HTN (hypertension)   . CAD (coronary artery disease)   . Hyperlipidemia   . Obesity   . Prostate cancer [facility])     Past Surgical History  Procedure Laterality Date  . Stenting of lad       Medications: Current Outpatient Prescriptions  Medication Sig Dispense Refill  . aspirin 325 MG EC tablet Take 325 mg by mouth daily.    .  cholecalciferol (VITAMIN D) 1000 UNITS tablet Take 1,000 Units by mouth daily.    Marland Kitchen lisinopril (PRINIVIL,ZESTRIL) 20 MG tablet Take 1 tablet (20 mg total) by mouth daily. 90 tablet 3  . metoprolol (LOPRESSOR) 50 MG tablet Take 1 tablet (50 mg total) by mouth 2 (two) times daily. 180 tablet 3  . Multiple Vitamins-Minerals (CENTRUM PO) Take by mouth daily.    . nitroGLYCERIN (NITROSTAT) 0.4 MG SL tablet Place 1 tablet (0.4 mg total) under the tongue every 5 (five) minutes as needed. 25 tablet 12  . Omega-3 Fatty Acids (FISH OIL PO) Take 1 tablet by mouth daily.    . pravastatin (PRAVACHOL) 40 MG tablet Take 1 tablet (40 mg total) by mouth every evening. 90 tablet 3  . SELENIUM PO Take 200 mcg by mouth daily.     No current facility-administered medications for this visit.    Allergies: No Known Allergies  Social History: The patient  reports that he has quit smoking. He does not have any smokeless tobacco history on file.   Family History: The patient's family history is not on file.   Review of Systems: Please see the history of present illness.   Otherwise, the review of systems is positive for none.   All other systems are reviewed and negative.   Physical Exam: VS:  BP  108/70 mmHg  Pulse 52  Ht 6\' 1"  (1.854 m)  Wt 208 lb 6.4 oz (94.53 kg)  BMI 27.50 kg/m2 .  BMI Body mass index is 27.5 kg/(m^2).  Wt Readings from Last 3 Encounters:  03/07/16 208 lb 6.4 oz (94.53 kg)  03/02/16 215 lb (97.523 kg)  12/31/15 215 lb (97.523 kg)    General: Pleasant. Well developed, well nourished and in no acute distress.  HEENT: Normal. Neck: Supple, no JVD, carotid bruits, or masses noted.  Cardiac: Regular rate and rhythm. No murmurs, rubs, or gallops. No edema.  Respiratory:  Lungs are clear to auscultation bilaterally with normal work of breathing.  GI: Soft and nontender.  MS: No deformity or atrophy. Gait and ROM intact. Skin: Warm and dry. Color is normal.  Neuro:  Strength and  sensation are intact and no gross focal deficits noted.  Psych: Alert, appropriate and with normal affect.   LABORATORY DATA:  EKG:  EKG is not ordered today.  Lab Results  Component Value Date   WBC 4.6 01/21/2016   HGB 14.1 01/21/2016   HCT 41.4 01/21/2016   PLT 211 01/21/2016   GLUCOSE 91 01/21/2016   CHOL 186 01/21/2016   TRIG 157* 01/21/2016   HDL 48 01/21/2016   LDLCALC 107 01/21/2016   ALT 17 01/21/2016   AST 18 01/21/2016   NA 137 01/21/2016   K 4.3 01/21/2016   CL 103 01/21/2016   CREATININE 0.88 01/21/2016   BUN 21 01/21/2016   CO2 25 01/21/2016    BNP (last 3 results) No results for input(s): BNP in the last 8760 hours.  ProBNP (last 3 results) No results for input(s): PROBNP in the last 8760 hours.   Other Studies Reviewed Today:  Myoview Study Highlights from 02/2016     Nuclear stress EF: 42% with mid and distal anterior, anteroseptal and apical akinesis  The left ventricular ejection fraction is moderately decreased (30-44%).  There was no ST segment deviation noted during stress.  There is a small defect of moderate severity present in the basal inferoseptal and mid inferoseptal location. The defect is reversible and consistent with ischemia.  There is a large defect of severe severity present in the mid anterior, mid anteroseptal, apical anterior, apical septal and apex location. The defect is partially reversible and consistent with infarct and peri infarct ischemia.  This is a high risk study.   Echo Study Conclusions from 01/2016  - Left ventricle: The cavity size was normal. Wall thickness was  normal. Systolic function was moderately to severely reduced. The  estimated ejection fraction was in the range of 30% to 35%. There  is severe hypokinesis of the anteroseptal and apical myocardium.  Doppler parameters are consistent with abnormal left ventricular  relaxation (grade 1 diastolic dysfunction). - Aortic valve: There was mild  regurgitation. - Aortic root: The aortic root was mildly dilated. - Mitral valve: There was mild regurgitation.  Impressions:  - Severe hypokinesis of the anteroseptal wall and apex; overall  moderate to severe LV dysfunction; grade 1 diastolic dysfunction;  mild AI and MR; trace TR.  Assessment/Plan: 1. High risk Myoview - cardiac catheterization has been recommended. The patient understands that risks include but are not limited to stroke (1 in 1000), death (1 in 42), kidney failure [usually temporary] (1 in 500), bleeding (1 in 200), allergic reaction [possibly serious] (1 in 200), and agrees to proceed.   2. Worsening LV function -  No active heart failure. Proceeding on with  cardiac cath.   3. Known CAD - with remote stent - last cath from 2006  4. Prostate cancer  Current medicines are reviewed with the patient today.  The patient does not have concerns regarding medicines other than what has been noted above.  The following changes have been made:  See above.  Labs/ tests ordered today include:    Orders Placed This Encounter  Procedures  . DG Chest 2 View  . Basic metabolic panel  . CBC  . Protime-INR  . APTT     Disposition:   Further disposition to follow.    Patient is agreeable to this plan and will call if any problems develop in the interim.   Signed: Burtis Junes, RN, ANP-C 03/07/2016 11:11 AM  [facility] [address] [place], [address]  [address] Phone: [phone] Fax: [phone]

## 2016-03-10 ENCOUNTER — Encounter (HOSPITAL_COMMUNITY): Payer: Self-pay | Admitting: Interventional Cardiology

## 2016-03-14 DIAGNOSIS — C61 Malignant neoplasm of prostate: Secondary | ICD-10-CM | POA: Diagnosis not present

## 2016-03-21 DIAGNOSIS — Z8546 Personal history of malignant neoplasm of prostate: Secondary | ICD-10-CM | POA: Diagnosis not present

## 2016-04-11 NOTE — Progress Notes (Signed)
HPI: FU coronary artery disease. Patient had a myocardial infarction in July of 2000. He was treated with TPA and had stenting of his LAD and PTCA of his second diagonal. Abdominal ultrasound in April of 2014 showed no aneurysm. Echocardiogram repeated in April 2017 and showed ejection fraction 30-35%, mild aortic and mitral regurgitation and mildly dilated aortic root. Nuclear study May 2017 showed ejection fraction of 42%, inferoseptal ischemia and a prior anterior infarct with peri-infarct ischemia. Cardiac catheterization June 2017 showed a 50% proximal right coronary artery, 45% proximal to distal LAD and 50% circumflex. Ejection fraction 45-50%. Since last seen, He denies dyspnea, chest pain, palpitations or syncope. Last evening he had transient dizziness and unsteadiness with ambulation. There was some spinning of the room. He had nausea and vomiting. His symptoms have resolved this morning. He denies weakness or loss of sensation in his extremities. No dysarthria.  Current Outpatient Prescriptions  Medication Sig Dispense Refill  . Ascorbic Acid (VITAMIN C) 1000 MG tablet Take 1,000 mg by mouth daily.    Marland Kitchen aspirin 325 MG EC tablet Take 325 mg by mouth daily.    . cholecalciferol (VITAMIN D) 1000 UNITS tablet Take 1,000 Units by mouth daily.    . Coenzyme Q10 (CO Q 10) 100 MG CAPS Take 1 capsule by mouth daily.    Marland Kitchen lisinopril (PRINIVIL,ZESTRIL) 20 MG tablet Take 1 tablet (20 mg total) by mouth daily. 90 tablet 3  . metoprolol (LOPRESSOR) 50 MG tablet Take 1 tablet (50 mg total) by mouth 2 (two) times daily. 180 tablet 3  . Multiple Vitamins-Minerals (CENTRUM PO) Take by mouth daily.    . nitroGLYCERIN (NITROSTAT) 0.4 MG SL tablet Place 1 tablet (0.4 mg total) under the tongue every 5 (five) minutes as needed. 25 tablet 12  . Omega-3 Fatty Acids (FISH OIL) 1000 MG CAPS Take 1 capsule by mouth daily.    . pravastatin (PRAVACHOL) 40 MG tablet Take 1 tablet (40 mg total) by mouth every  evening. (Patient taking differently: Take 20 mg by mouth every evening. ) 90 tablet 3  . Selenium 200 MCG TABS Take 1 tablet by mouth daily.     No current facility-administered medications for this visit.     Past Medical History  Diagnosis Date  . HTN (hypertension)   . CAD (coronary artery disease)   . Hyperlipidemia   . Obesity   . Prostate cancer [facility])     Past Surgical History  Procedure Laterality Date  . Stenting of lad    . Cardiac catheterization N/A 03/09/2016    Procedure: Left Heart Cath and Coronary Angiography;  Surgeon: Belva Crome, MD;  Location: [facility];  Service: Cardiovascular;  Laterality: N/A;    Social History   Social History  . Marital Status: Married    Spouse Name: N/A  . Number of Children: N/A  . Years of Education: N/A   Occupational History  . Not on file.   Social History Main Topics  . Smoking status: Former Research scientist (life sciences)  . Smokeless tobacco: Not on file  . Alcohol Use: Not on file  . Drug Use: Not on file  . Sexual Activity: Not on file   Other Topics Concern  . Not on file   Social History Narrative    History reviewed. No pertinent family history.  ROS: no fevers or chills, productive cough, hemoptysis, dysphasia, odynophagia, melena, hematochezia, dysuria, hematuria, rash, seizure activity, orthopnea, PND, pedal edema, claudication. Remaining systems  are negative.  Physical Exam: Well-developed well-nourished in no acute distress.  Skin is warm and dry.  HEENT is normal.  Neck is supple.  Chest is clear to auscultation with normal expansion.  Cardiovascular exam is regular rate and rhythm.  Abdominal exam nontender or distended. No masses palpated. Extremities show no edema. neuro grossly intact  ECG Sinus rhythm with occasional PAC. Left anterior fascicular block. Right bundle branch block. Septal infarct.  Assessment and plan  1 coronary artery disease-continue aspirin and statin.  2 hyperlipidemia-he  could not tolerate higher doses of statin. Continue low-dose Pravachol. He declines Zetia.  3 hypertension-blood pressure controlled. Continue present meds  4 ischemic cardiomyopathy-continue ACE inhibitor and beta blocker.  5 dizziness-transient episode of dizziness last evening without other neurological symptoms. Possible vertigo. Symptoms have now completely resolved and no focal findings on neurological examination. We will follow.  Kirk Ruths, MD

## 2016-04-13 ENCOUNTER — Encounter: Payer: Self-pay | Admitting: Cardiology

## 2016-04-13 ENCOUNTER — Ambulatory Visit (INDEPENDENT_AMBULATORY_CARE_PROVIDER_SITE_OTHER): Payer: Medicare Other | Admitting: Cardiology

## 2016-04-13 VITALS — BP 124/60 | HR 88 | Ht 73.0 in | Wt 206.0 lb

## 2016-04-13 DIAGNOSIS — E785 Hyperlipidemia, unspecified: Secondary | ICD-10-CM | POA: Diagnosis not present

## 2016-04-13 DIAGNOSIS — I2510 Atherosclerotic heart disease of native coronary artery without angina pectoris: Secondary | ICD-10-CM | POA: Diagnosis not present

## 2016-04-13 DIAGNOSIS — I10 Essential (primary) hypertension: Secondary | ICD-10-CM

## 2016-04-13 NOTE — Patient Instructions (Signed)
Your physician wants you to follow-up in: 1 year with Dr. Crenshaw. You will receive a reminder letter in the mail two months in advance. If you don't receive a letter, please call our office to schedule the follow-up appointment.   If you need a refill on your cardiac medications before your next appointment, please call your pharmacy.   

## 2016-08-15 DIAGNOSIS — Z23 Encounter for immunization: Secondary | ICD-10-CM | POA: Diagnosis not present

## 2016-09-14 DIAGNOSIS — C61 Malignant neoplasm of prostate: Secondary | ICD-10-CM | POA: Diagnosis not present

## 2016-09-19 DIAGNOSIS — Z8546 Personal history of malignant neoplasm of prostate: Secondary | ICD-10-CM | POA: Diagnosis not present

## 2016-09-19 DIAGNOSIS — R9721 Rising PSA following treatment for malignant neoplasm of prostate: Secondary | ICD-10-CM | POA: Diagnosis not present

## 2017-01-18 DIAGNOSIS — Z1211 Encounter for screening for malignant neoplasm of colon: Secondary | ICD-10-CM | POA: Diagnosis not present

## 2017-01-18 DIAGNOSIS — I5021 Acute systolic (congestive) heart failure: Secondary | ICD-10-CM | POA: Diagnosis not present

## 2017-01-18 DIAGNOSIS — Z0000 Encounter for general adult medical examination without abnormal findings: Secondary | ICD-10-CM | POA: Diagnosis not present

## 2017-01-24 ENCOUNTER — Other Ambulatory Visit: Payer: Self-pay | Admitting: *Deleted

## 2017-01-24 DIAGNOSIS — I2510 Atherosclerotic heart disease of native coronary artery without angina pectoris: Secondary | ICD-10-CM

## 2017-01-24 DIAGNOSIS — E785 Hyperlipidemia, unspecified: Secondary | ICD-10-CM

## 2017-01-24 DIAGNOSIS — I10 Essential (primary) hypertension: Secondary | ICD-10-CM

## 2017-01-24 MED ORDER — METOPROLOL TARTRATE 50 MG PO TABS
50.0000 mg | ORAL_TABLET | Freq: Two times a day (BID) | ORAL | 3 refills | Status: DC
Start: 2017-01-24 — End: 2017-04-17

## 2017-01-24 MED ORDER — LISINOPRIL 20 MG PO TABS: 20.0000 mg | ORAL_TABLET | Freq: Every day | ORAL | 3 refills | Status: DC

## 2017-03-13 DIAGNOSIS — Z8546 Personal history of malignant neoplasm of prostate: Secondary | ICD-10-CM | POA: Diagnosis not present

## 2017-03-20 DIAGNOSIS — C61 Malignant neoplasm of prostate: Secondary | ICD-10-CM | POA: Diagnosis not present

## 2017-04-03 NOTE — Progress Notes (Signed)
HPI: FU coronary artery disease. Patient had a myocardial infarction in July of 2000. He was treated with TPA and had stenting of his LAD and PTCA of his second diagonal. Abdominal ultrasound in April of 2014 showed no aneurysm. Echocardiogram repeated in April 2017 and showed ejection fraction 30-35%, mild aortic and mitral regurgitation and mildly dilated aortic root. Nuclear study May 2017 showed ejection fraction of 42%, inferoseptal ischemia and a prior anterior infarct with peri-infarct ischemia. Cardiac catheterization June 2017 showed a 50% proximal right coronary artery, 45% proximal to distal LAD and 50% circumflex. Ejection fraction 45-50%. Since last seen, the patient denies any dyspnea on exertion, orthopnea, PND, pedal edema, palpitations, syncope or chest pain.   Current Outpatient Prescriptions  Medication Sig Dispense Refill  . Ascorbic Acid (VITAMIN C) 1000 MG tablet Take 1,000 mg by mouth daily.    Marland Kitchen aspirin 81 MG tablet Take 81 mg by mouth 2 (two) times daily.    . cholecalciferol (VITAMIN D) 1000 UNITS tablet Take 1,000 Units by mouth daily.    . Coenzyme Q10 (CO Q 10) 100 MG CAPS Take 1 capsule by mouth daily.    Marland Kitchen lisinopril (PRINIVIL,ZESTRIL) 20 MG tablet Take 1 tablet (20 mg total) by mouth daily. 90 tablet 3  . metoprolol (LOPRESSOR) 50 MG tablet Take 1 tablet (50 mg total) by mouth 2 (two) times daily. 180 tablet 3  . Multiple Vitamins-Minerals (CENTRUM PO) Take by mouth daily.    . nitroGLYCERIN (NITROSTAT) 0.4 MG SL tablet Place 1 tablet (0.4 mg total) under the tongue every 5 (five) minutes as needed. 25 tablet 12  . Omega-3 Fatty Acids (FISH OIL) 1000 MG CAPS Take 1 capsule by mouth daily.    . pravastatin (PRAVACHOL) 20 MG tablet Take 20 mg by mouth daily.    . vitamin B-12 (CYANOCOBALAMIN) 1000 MCG tablet Take 1,000 mcg by mouth daily.     No current facility-administered medications for this visit.      Past Medical History:  Diagnosis Date  . CAD  (coronary artery disease)   . HTN (hypertension)   . Hyperlipidemia   . Obesity   . Prostate cancer [facility])     Past Surgical History:  Procedure Laterality Date  . CARDIAC CATHETERIZATION N/A 03/09/2016   Procedure: Left Heart Cath and Coronary Angiography;  Surgeon: Belva Crome, MD;  Location: [place] [facility];  Service: Cardiovascular;  Laterality: N/A;  . Stenting of LAD      Social History   Social History  . Marital status: Married    Spouse name: N/A  . Number of children: N/A  . Years of education: N/A   Occupational History  . Not on file.   Social History Main Topics  . Smoking status: Former Research scientist (life sciences)  . Smokeless tobacco: Never Used  . Alcohol use Not on file  . Drug use: Unknown  . Sexual activity: Not on file   Other Topics Concern  . Not on file   Social History Narrative  . No narrative on file    History reviewed. No pertinent family history.  ROS: no fevers or chills, productive cough, hemoptysis, dysphasia, odynophagia, melena, hematochezia, dysuria, hematuria, rash, seizure activity, orthopnea, PND, pedal edema, claudication. Remaining systems are negative.  Physical Exam: Well-developed well-nourished in no acute distress.  Skin is warm and dry.  HEENT is normal.  Neck is supple. No bruits Chest is clear to auscultation with normal expansion.  Cardiovascular exam is regular  rate and rhythm.  Abdominal exam nontender or distended. No masses palpated. Extremities show no edema. neuro grossly intact  ECG- Sinus rhythm at a rate of 51. Right bundle branch block. Cannot rule out prior septal infarct. personally reviewed  A/P  1 Coronary artery disease-continue aspirin and statin.  2 ischemic cardiomyopathy-continue ACE inhibitor and beta blocker.  3 hypertension-blood pressure is controlled. Continue present medications. Check K and renal function.   4 hyperlipidemia-patient did not tolerate high-dose statin previously. We will continue  with pravastatin that present dose. He has declined Zetia in the past. Check lipids and liver.   Kirk Ruths, MD

## 2017-04-14 DIAGNOSIS — H2513 Age-related nuclear cataract, bilateral: Secondary | ICD-10-CM | POA: Diagnosis not present

## 2017-04-17 ENCOUNTER — Ambulatory Visit (INDEPENDENT_AMBULATORY_CARE_PROVIDER_SITE_OTHER): Payer: Medicare Other | Admitting: Cardiology

## 2017-04-17 ENCOUNTER — Encounter: Payer: Self-pay | Admitting: Cardiology

## 2017-04-17 VITALS — BP 112/64 | HR 51 | Ht 73.0 in | Wt 210.0 lb

## 2017-04-17 DIAGNOSIS — E785 Hyperlipidemia, unspecified: Secondary | ICD-10-CM | POA: Diagnosis not present

## 2017-04-17 DIAGNOSIS — I10 Essential (primary) hypertension: Secondary | ICD-10-CM | POA: Diagnosis not present

## 2017-04-17 DIAGNOSIS — I2510 Atherosclerotic heart disease of native coronary artery without angina pectoris: Secondary | ICD-10-CM | POA: Diagnosis not present

## 2017-04-17 DIAGNOSIS — I255 Ischemic cardiomyopathy: Secondary | ICD-10-CM | POA: Diagnosis not present

## 2017-04-17 MED ORDER — NITROGLYCERIN 0.4 MG SL SUBL: 0.4000 mg | SUBLINGUAL_TABLET | SUBLINGUAL | 12 refills | Status: DC | PRN

## 2017-04-17 MED ORDER — LISINOPRIL 20 MG PO TABS
20.0000 mg | ORAL_TABLET | Freq: Every day | ORAL | 3 refills | Status: DC
Start: 2017-04-17 — End: 2018-07-05

## 2017-04-17 MED ORDER — PRAVASTATIN SODIUM 20 MG PO TABS: 20.0000 mg | ORAL_TABLET | Freq: Every day | ORAL | 3 refills | Status: DC

## 2017-04-17 MED ORDER — METOPROLOL TARTRATE 50 MG PO TABS: 50.0000 mg | ORAL_TABLET | Freq: Two times a day (BID) | ORAL | 3 refills | Status: DC

## 2017-04-17 NOTE — Addendum Note (Signed)
Addended by: Cristopher Estimable on: 04/17/2017 11:11 AM   Modules accepted: Orders

## 2017-04-17 NOTE — Patient Instructions (Signed)
Medication Instructions:   NO CHANGE  Labwork:  Your physician recommends that you return for lab work WHEN FASTING  Follow-Up:  Your physician wants you to follow-up in: ONE YEAR WITH DR CRENSHAW You will receive a reminder letter in the mail two months in advance. If you don't receive a letter, please call our office to schedule the follow-up appointment.   If you need a refill on your cardiac medications before your next appointment, please call your pharmacy.    

## 2017-04-17 NOTE — Addendum Note (Signed)
Addended by: Cristopher Estimable on: 04/17/2017 11:09 AM   Modules accepted: Orders

## 2017-04-18 DIAGNOSIS — I2510 Atherosclerotic heart disease of native coronary artery without angina pectoris: Secondary | ICD-10-CM | POA: Diagnosis not present

## 2017-04-18 LAB — COMPREHENSIVE METABOLIC PANEL
ALK PHOS: 54 IU/L (ref 39–117)
ALT: 18 IU/L (ref 0–44)
AST: 20 IU/L (ref 0–40)
Albumin/Globulin Ratio: 1.8 (ref 1.2–2.2)
Albumin: 4.2 g/dL (ref 3.5–4.7)
BILIRUBIN TOTAL: 0.6 mg/dL (ref 0.0–1.2)
BUN/Creatinine Ratio: 21 (ref 10–24)
BUN: 16 mg/dL (ref 8–27)
CHLORIDE: 101 mmol/L (ref 96–106)
CO2: 21 mmol/L (ref 20–29)
CREATININE: 0.78 mg/dL (ref 0.76–1.27)
Calcium: 9.1 mg/dL (ref 8.6–10.2)
GFR calc Af Amer: 99 mL/min/{1.73_m2} (ref >59)
GFR calc non Af Amer: 85 mL/min/{1.73_m2} (ref >59)
GLUCOSE: 95 mg/dL (ref 65–99)
Globulin, Total: 2.4 g/dL (ref 1.5–4.5)
Potassium: 5.1 mmol/L (ref 3.5–5.2)
Sodium: 141 mmol/L (ref 134–144)
TOTAL PROTEIN: 6.6 g/dL (ref 6.0–8.5)

## 2017-04-18 LAB — LIPID PANEL
CHOL/HDL RATIO: 4.2 ratio (ref 0.0–5.0)
Cholesterol, Total: 210 mg/dL — ABNORMAL HIGH (ref 100–199)
HDL: 50 mg/dL (ref >39)
LDL Calculated: 115 mg/dL — ABNORMAL HIGH (ref 0–99)
Triglycerides: 225 mg/dL — ABNORMAL HIGH (ref 0–149)
VLDL CHOLESTEROL CAL: 45 mg/dL — AB (ref 5–40)

## 2017-08-01 IMAGING — CR DG CHEST 2V
2 series · 2 of 2 positions shown · non-contrast
Comparison: 09/20/2013

CLINICAL DATA: Coronary artery disease, preoperative testing.

EXAM:
CHEST  2 VIEW

[view not recorded (1 of 2)]
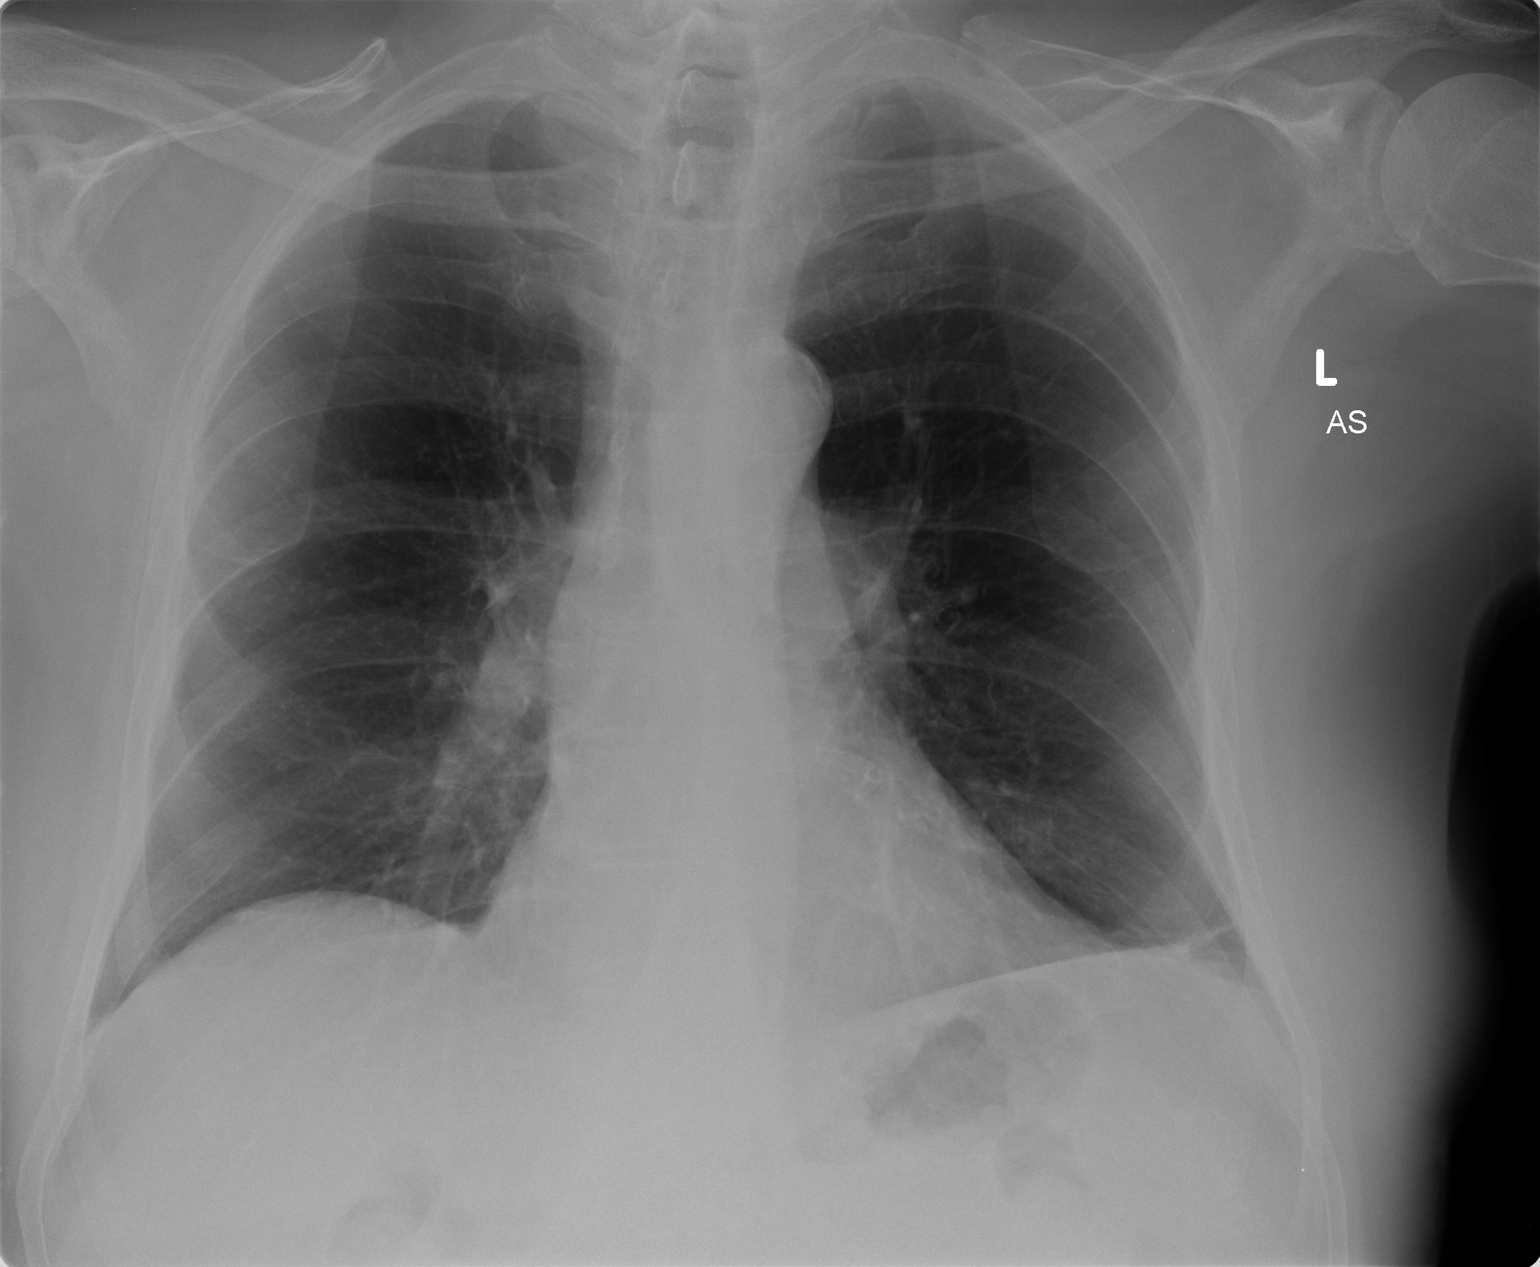

[view not recorded (2 of 2)]
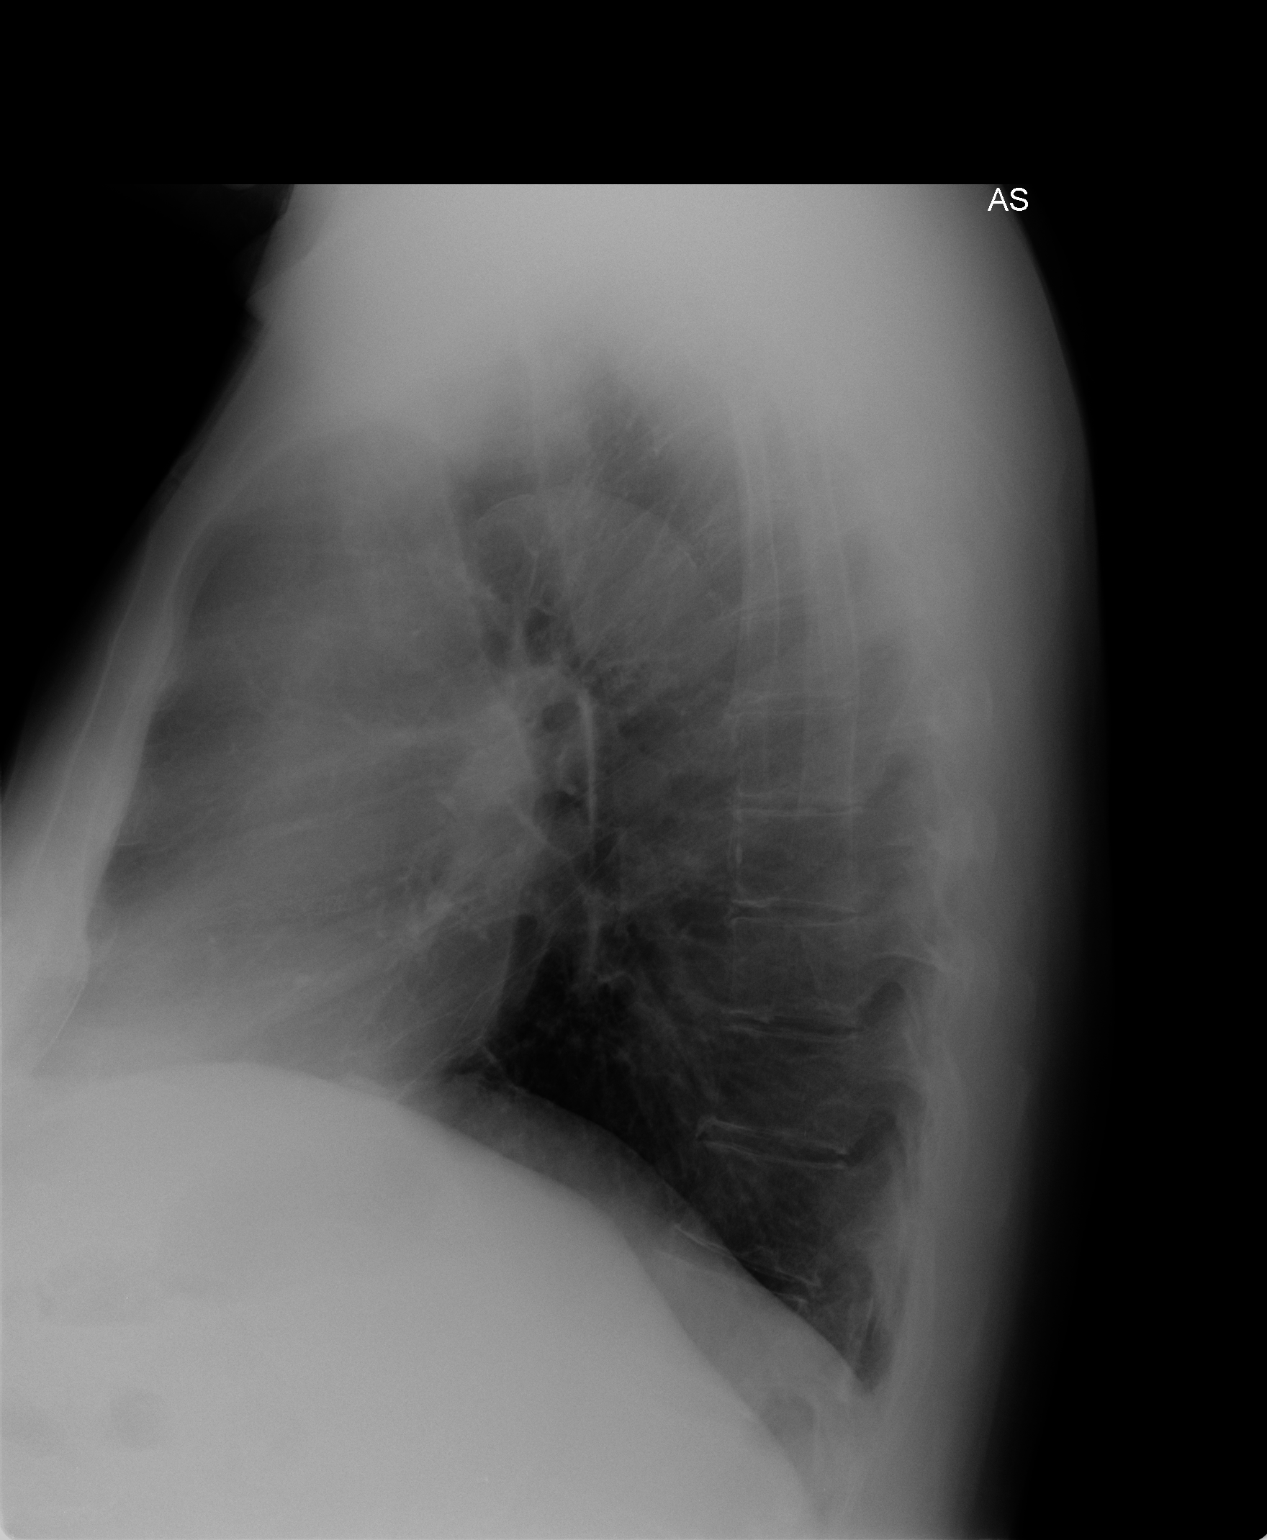

[2 of 2 positions shown; findings below may reference images not displayed]

FINDINGS: Scarring at the left base. Right lung is clear. Heart is normal
size. No effusions or acute bony abnormality.
IMPRESSION: No active cardiopulmonary disease.

## 2017-08-21 DIAGNOSIS — Z23 Encounter for immunization: Secondary | ICD-10-CM | POA: Diagnosis not present

## 2017-08-29 DIAGNOSIS — R972 Elevated prostate specific antigen [PSA]: Secondary | ICD-10-CM | POA: Diagnosis not present

## 2017-09-05 DIAGNOSIS — R9721 Rising PSA following treatment for malignant neoplasm of prostate: Secondary | ICD-10-CM | POA: Diagnosis not present

## 2017-09-29 ENCOUNTER — Other Ambulatory Visit: Payer: Self-pay | Admitting: Urology

## 2017-09-29 DIAGNOSIS — C61 Malignant neoplasm of prostate: Secondary | ICD-10-CM

## 2017-12-12 ENCOUNTER — Encounter (HOSPITAL_COMMUNITY)
Admission: RE | Admit: 2017-12-12 | Discharge: 2017-12-12 | Disposition: A | Discharge status: Home / Self Care | Payer: Medicare Other | Source: Ambulatory Visit | Attending: Urology | Admitting: Urology

## 2017-12-12 DIAGNOSIS — R972 Elevated prostate specific antigen [PSA]: Secondary | ICD-10-CM | POA: Diagnosis not present

## 2017-12-12 DIAGNOSIS — C61 Malignant neoplasm of prostate: Secondary | ICD-10-CM

## 2017-12-12 DIAGNOSIS — M899 Disorder of bone, unspecified: Secondary | ICD-10-CM | POA: Insufficient documentation

## 2017-12-12 DIAGNOSIS — Z8546 Personal history of malignant neoplasm of prostate: Secondary | ICD-10-CM | POA: Insufficient documentation

## 2017-12-12 MED ORDER — TECHNETIUM TC 99M MEDRONATE IV KIT
22.0000 | PACK | Freq: Once | INTRAVENOUS | Status: AC | PRN
  Administered 2017-12-12: 22 via INTRAVENOUS

## 2017-12-19 DIAGNOSIS — C61 Malignant neoplasm of prostate: Secondary | ICD-10-CM | POA: Diagnosis not present

## 2017-12-19 DIAGNOSIS — R9721 Rising PSA following treatment for malignant neoplasm of prostate: Secondary | ICD-10-CM | POA: Diagnosis not present

## 2017-12-19 DIAGNOSIS — C7951 Secondary malignant neoplasm of bone: Secondary | ICD-10-CM | POA: Diagnosis not present

## 2018-01-24 DIAGNOSIS — Z0000 Encounter for general adult medical examination without abnormal findings: Secondary | ICD-10-CM | POA: Diagnosis not present

## 2018-01-24 DIAGNOSIS — C44311 Basal cell carcinoma of skin of nose: Secondary | ICD-10-CM | POA: Insufficient documentation

## 2018-01-24 DIAGNOSIS — Z1211 Encounter for screening for malignant neoplasm of colon: Secondary | ICD-10-CM | POA: Diagnosis not present

## 2018-02-08 DIAGNOSIS — C44311 Basal cell carcinoma of skin of nose: Secondary | ICD-10-CM | POA: Diagnosis not present

## 2018-03-27 DIAGNOSIS — C7951 Secondary malignant neoplasm of bone: Secondary | ICD-10-CM | POA: Diagnosis not present

## 2018-04-10 ENCOUNTER — Encounter: Payer: Self-pay | Admitting: Cardiology

## 2018-04-10 MED ORDER — PRAVASTATIN SODIUM 20 MG PO TABS: 20.0000 mg | ORAL_TABLET | Freq: Every day | ORAL | 0 refills | Status: DC

## 2018-05-01 NOTE — Progress Notes (Signed)
HPI: FU coronary artery disease. Patient had a myocardial infarction in July of 2000. He was treated with TPA and had stenting of his LAD and PTCA of his second diagonal. Abdominal ultrasound in April of 2014 showed no aneurysm. Echocardiogram repeated in April 2017 and showed ejection fraction 30-35%, mild aortic and mitral regurgitation and mildly dilated aortic root. Nuclear study May 2017 showed ejection fraction of 42%, inferoseptal ischemia and a prior anterior infarct with peri-infarct ischemia. Cardiac catheterization June 2017 showed a 50% proximal right coronary artery, 45% proximal to distal LAD and 50% circumflex. Ejection fraction 45-50%. Since last seen, the patient has dyspnea with more extreme activities but not with routine activities. It is relieved with rest. It is not associated with chest pain. There is no orthopnea, PND or pedal edema. There is no syncope or palpitations. There is no exertional chest pain.   Current Outpatient Medications  Medication Sig Dispense Refill  . aspirin 81 MG tablet Take 81 mg by mouth 2 (two) times daily.    . cholecalciferol (VITAMIN D) 1000 UNITS tablet Take 1,000 Units by mouth daily.    . Coenzyme Q10 (CO Q 10) 100 MG CAPS Take 1 capsule by mouth daily.    Marland Kitchen lisinopril (PRINIVIL,ZESTRIL) 20 MG tablet Take 1 tablet (20 mg total) by mouth daily. 90 tablet 3  . metoprolol tartrate (LOPRESSOR) 50 MG tablet Take 1 tablet (50 mg total) by mouth 2 (two) times daily. 180 tablet 3  . nitroGLYCERIN (NITROSTAT) 0.4 MG SL tablet Place 1 tablet (0.4 mg total) under the tongue every 5 (five) minutes as needed. 25 tablet 12  . Omega-3 Fatty Acids (FISH OIL) 1000 MG CAPS Take 1 capsule by mouth daily.    . pravastatin (PRAVACHOL) 20 MG tablet Take 1 tablet (20 mg total) by mouth daily. 90 tablet 0  . vitamin B-12 (CYANOCOBALAMIN) 1000 MCG tablet Take 1,000 mcg by mouth daily.     No current facility-administered medications for this visit.      Past  Medical History:  Diagnosis Date  . CAD (coronary artery disease)   . HTN (hypertension)   . Hyperlipidemia   . Obesity   . Prostate cancer [facility])     Past Surgical History:  Procedure Laterality Date  . CARDIAC CATHETERIZATION N/A 03/09/2016   Procedure: Left Heart Cath and Coronary Angiography;  Surgeon: Belva Crome, MD;  Location: [place] [facility];  Service: Cardiovascular;  Laterality: N/A;  . Stenting of LAD      Social History   Socioeconomic History  . Marital status: Married    Spouse name: Not on file  . Number of children: Not on file  . Years of education: Not on file  . Highest education level: Not on file  Occupational History  . Not on file  Social Needs  . Financial resource strain: Not on file  . Food insecurity:    Worry: Not on file    Inability: Not on file  . Transportation needs:    Medical: Not on file    Non-medical: Not on file  Tobacco Use  . Smoking status: Former Research scientist (life sciences)  . Smokeless tobacco: Never Used  Substance and Sexual Activity  . Alcohol use: Not on file  . Drug use: Not on file  . Sexual activity: Not on file  Lifestyle  . Physical activity:    Days per week: Not on file    Minutes per session: Not on file  .  Stress: Not on file  Relationships  . Social connections:    Talks on phone: Not on file    Gets together: Not on file    Attends religious service: Not on file    Active member of club or organization: Not on file    Attends meetings of clubs or organizations: Not on file    Relationship status: Not on file  . Intimate partner violence:    Fear of current or ex partner: Not on file    Emotionally abused: Not on file    Physically abused: Not on file    Forced sexual activity: Not on file  Other Topics Concern  . Not on file  Social History Narrative  . Not on file    History reviewed. No pertinent family history.  ROS: no fevers or chills, productive cough, hemoptysis, dysphasia, odynophagia, melena,  hematochezia, dysuria, hematuria, rash, seizure activity, orthopnea, PND, pedal edema, claudication. Remaining systems are negative.  Physical Exam: Well-developed well-nourished in no acute distress.  Skin is warm and dry.  HEENT is normal.  Neck is supple.  Chest is clear to auscultation with normal expansion.  Cardiovascular exam is regular rate and rhythm.  Abdominal exam nontender or distended. No masses palpated. Extremities show no edema. neuro grossly intact  ECG-sinus bradycardia at a rate of 51.  Occasional PAC.  Right bundle branch block.  Cannot rule out prior septal infarct.  Personally reviewed  A/P  1 coronary artery disease-patient is doing well with no chest pain.  Continue medical therapy with aspirin and statin.  2 hypertension-pressure is controlled today.  Continue present medications.  Check potassium and renal function.  3 hyperlipidemia-plan to continue pravastatin.  He did not tolerate high-dose statin previously.  Check lipids and liver.    4 ischemic cardiomyopathy-continue ACE inhibitor and beta-blocker.  Kirk Ruths, MD

## 2018-05-07 ENCOUNTER — Encounter: Payer: Self-pay | Admitting: *Deleted

## 2018-05-07 ENCOUNTER — Encounter: Payer: Self-pay | Admitting: Cardiology

## 2018-05-07 ENCOUNTER — Ambulatory Visit (INDEPENDENT_AMBULATORY_CARE_PROVIDER_SITE_OTHER): Payer: Medicare Other | Admitting: Cardiology

## 2018-05-07 VITALS — BP 100/50 | HR 51 | Ht 72.0 in | Wt 206.0 lb

## 2018-05-07 DIAGNOSIS — I2510 Atherosclerotic heart disease of native coronary artery without angina pectoris: Secondary | ICD-10-CM | POA: Diagnosis not present

## 2018-05-07 DIAGNOSIS — I10 Essential (primary) hypertension: Secondary | ICD-10-CM | POA: Diagnosis not present

## 2018-05-07 DIAGNOSIS — E785 Hyperlipidemia, unspecified: Secondary | ICD-10-CM | POA: Diagnosis not present

## 2018-05-07 DIAGNOSIS — I255 Ischemic cardiomyopathy: Secondary | ICD-10-CM

## 2018-05-07 LAB — LIPID PANEL
CHOL/HDL RATIO: 3.8 ratio (ref 0.0–5.0)
CHOLESTEROL TOTAL: 185 mg/dL (ref 100–199)
HDL: 49 mg/dL (ref >39)
LDL CALC: 91 mg/dL (ref 0–99)
Triglycerides: 226 mg/dL — ABNORMAL HIGH (ref 0–149)
VLDL Cholesterol Cal: 45 mg/dL — ABNORMAL HIGH (ref 5–40)

## 2018-05-07 LAB — BASIC METABOLIC PANEL
BUN / CREAT RATIO: 19 (ref 10–24)
BUN: 18 mg/dL (ref 8–27)
CALCIUM: 8.9 mg/dL (ref 8.6–10.2)
CHLORIDE: 103 mmol/L (ref 96–106)
CO2: 23 mmol/L (ref 20–29)
Creatinine, Ser: 0.96 mg/dL (ref 0.76–1.27)
GFR calc Af Amer: 85 mL/min/{1.73_m2} (ref >59)
GFR calc non Af Amer: 74 mL/min/{1.73_m2} (ref >59)
GLUCOSE: 97 mg/dL (ref 65–99)
POTASSIUM: 5.1 mmol/L (ref 3.5–5.2)
SODIUM: 136 mmol/L (ref 134–144)

## 2018-05-07 LAB — HEPATIC FUNCTION PANEL
ALT: 12 IU/L (ref 0–44)
AST: 15 IU/L (ref 0–40)
Albumin: 4.1 g/dL (ref 3.5–4.7)
Alkaline Phosphatase: 53 IU/L (ref 39–117)
Bilirubin Total: 0.4 mg/dL (ref 0.0–1.2)
Bilirubin, Direct: 0.11 mg/dL (ref 0.00–0.40)
Total Protein: 6.4 g/dL (ref 6.0–8.5)

## 2018-05-07 NOTE — Patient Instructions (Signed)
Medication Instructions:   NO CHANGE  Labwork:  Your physician recommends that you HAVE LAB WORK TODAY  Follow-Up:  Your physician wants you to follow-up in: ONE YEAR WITH DR CRENSHAW You will receive a reminder letter in the mail two months in advance. If you don't receive a letter, please call our office to schedule the follow-up appointment.   If you need a refill on your cardiac medications before your next appointment, please call your pharmacy.    

## 2018-06-13 DIAGNOSIS — C61 Malignant neoplasm of prostate: Secondary | ICD-10-CM | POA: Diagnosis not present

## 2018-06-18 DIAGNOSIS — Z8546 Personal history of malignant neoplasm of prostate: Secondary | ICD-10-CM | POA: Diagnosis not present

## 2018-07-05 ENCOUNTER — Telehealth: Payer: Self-pay | Admitting: Cardiology

## 2018-07-05 MED ORDER — LISINOPRIL 20 MG PO TABS: 20.0000 mg | ORAL_TABLET | Freq: Every day | ORAL | 3 refills | Status: DC

## 2018-07-05 NOTE — Telephone Encounter (Signed)
New Message    *STAT* If patient is at the pharmacy, call can be transferred to refill team.   1. Which medications need to be refilled? (please list name of each medication and dose if known) sinopril (PRINIVIL,ZESTRIL) 20 MG tablet  2. Which pharmacy/location (including street and city if local pharmacy) is medication to be sent to? [facility] # [place], [place]  3. Do they need a 30 day or 90 day supply? 90 day

## 2018-07-09 DIAGNOSIS — E785 Hyperlipidemia, unspecified: Secondary | ICD-10-CM

## 2018-07-09 DIAGNOSIS — I10 Essential (primary) hypertension: Secondary | ICD-10-CM

## 2018-07-09 DIAGNOSIS — I2510 Atherosclerotic heart disease of native coronary artery without angina pectoris: Secondary | ICD-10-CM

## 2018-07-09 MED ORDER — METOPROLOL TARTRATE 50 MG PO TABS: 50.0000 mg | ORAL_TABLET | Freq: Two times a day (BID) | ORAL | 2 refills | Status: DC

## 2018-07-09 MED ORDER — PRAVASTATIN SODIUM 20 MG PO TABS: 20.0000 mg | ORAL_TABLET | Freq: Every day | ORAL | 2 refills | Status: DC

## 2018-07-24 DIAGNOSIS — Z23 Encounter for immunization: Secondary | ICD-10-CM | POA: Diagnosis not present

## 2018-11-11 ENCOUNTER — Encounter (HOSPITAL_COMMUNITY): Payer: Self-pay | Admitting: Emergency Medicine

## 2018-11-11 ENCOUNTER — Other Ambulatory Visit: Payer: Self-pay

## 2018-11-11 ENCOUNTER — Emergency Department (HOSPITAL_COMMUNITY): Payer: Medicare Other

## 2018-11-11 ENCOUNTER — Inpatient Hospital Stay (HOSPITAL_COMMUNITY)
Admission: EM | Admit: 2018-11-11 | Discharge: 2018-11-14 | DRG: 291 | Disposition: A | Discharge status: Home / Self Care | Payer: Medicare Other | Attending: Internal Medicine | Admitting: Internal Medicine

## 2018-11-11 DIAGNOSIS — R338 Other retention of urine: Secondary | ICD-10-CM | POA: Diagnosis not present

## 2018-11-11 DIAGNOSIS — I110 Hypertensive heart disease with heart failure: Principal | ICD-10-CM | POA: Diagnosis present

## 2018-11-11 DIAGNOSIS — N1330 Unspecified hydronephrosis: Secondary | ICD-10-CM | POA: Diagnosis present

## 2018-11-11 DIAGNOSIS — Z79899 Other long term (current) drug therapy: Secondary | ICD-10-CM | POA: Diagnosis not present

## 2018-11-11 DIAGNOSIS — I5022 Chronic systolic (congestive) heart failure: Secondary | ICD-10-CM | POA: Diagnosis present

## 2018-11-11 DIAGNOSIS — I2510 Atherosclerotic heart disease of native coronary artery without angina pectoris: Secondary | ICD-10-CM | POA: Diagnosis present

## 2018-11-11 DIAGNOSIS — Z7982 Long term (current) use of aspirin: Secondary | ICD-10-CM | POA: Diagnosis not present

## 2018-11-11 DIAGNOSIS — Z8546 Personal history of malignant neoplasm of prostate: Secondary | ICD-10-CM | POA: Diagnosis not present

## 2018-11-11 DIAGNOSIS — I5041 Acute combined systolic (congestive) and diastolic (congestive) heart failure: Secondary | ICD-10-CM

## 2018-11-11 DIAGNOSIS — J9601 Acute respiratory failure with hypoxia: Secondary | ICD-10-CM | POA: Diagnosis not present

## 2018-11-11 DIAGNOSIS — N179 Acute kidney failure, unspecified: Secondary | ICD-10-CM | POA: Diagnosis present

## 2018-11-11 DIAGNOSIS — I10 Essential (primary) hypertension: Secondary | ICD-10-CM | POA: Diagnosis present

## 2018-11-11 DIAGNOSIS — E669 Obesity, unspecified: Secondary | ICD-10-CM | POA: Diagnosis present

## 2018-11-11 DIAGNOSIS — J90 Pleural effusion, not elsewhere classified: Secondary | ICD-10-CM | POA: Diagnosis not present

## 2018-11-11 DIAGNOSIS — I4510 Unspecified right bundle-branch block: Secondary | ICD-10-CM | POA: Diagnosis present

## 2018-11-11 DIAGNOSIS — E876 Hypokalemia: Secondary | ICD-10-CM | POA: Diagnosis present

## 2018-11-11 DIAGNOSIS — E782 Mixed hyperlipidemia: Secondary | ICD-10-CM | POA: Diagnosis present

## 2018-11-11 DIAGNOSIS — R3911 Hesitancy of micturition: Secondary | ICD-10-CM | POA: Diagnosis present

## 2018-11-11 DIAGNOSIS — N1339 Other hydronephrosis: Secondary | ICD-10-CM | POA: Diagnosis not present

## 2018-11-11 DIAGNOSIS — I77810 Thoracic aortic ectasia: Secondary | ICD-10-CM | POA: Diagnosis present

## 2018-11-11 DIAGNOSIS — E785 Hyperlipidemia, unspecified: Secondary | ICD-10-CM

## 2018-11-11 DIAGNOSIS — I252 Old myocardial infarction: Secondary | ICD-10-CM | POA: Diagnosis not present

## 2018-11-11 DIAGNOSIS — I959 Hypotension, unspecified: Secondary | ICD-10-CM | POA: Diagnosis not present

## 2018-11-11 DIAGNOSIS — R05 Cough: Secondary | ICD-10-CM | POA: Diagnosis not present

## 2018-11-11 DIAGNOSIS — I5043 Acute on chronic combined systolic (congestive) and diastolic (congestive) heart failure: Secondary | ICD-10-CM | POA: Diagnosis present

## 2018-11-11 DIAGNOSIS — I4891 Unspecified atrial fibrillation: Secondary | ICD-10-CM | POA: Diagnosis not present

## 2018-11-11 DIAGNOSIS — I480 Paroxysmal atrial fibrillation: Secondary | ICD-10-CM | POA: Diagnosis not present

## 2018-11-11 DIAGNOSIS — I509 Heart failure, unspecified: Secondary | ICD-10-CM

## 2018-11-11 DIAGNOSIS — Z6827 Body mass index (BMI) 27.0-27.9, adult: Secondary | ICD-10-CM | POA: Diagnosis not present

## 2018-11-11 DIAGNOSIS — R001 Bradycardia, unspecified: Secondary | ICD-10-CM | POA: Diagnosis present

## 2018-11-11 DIAGNOSIS — R0600 Dyspnea, unspecified: Secondary | ICD-10-CM | POA: Diagnosis not present

## 2018-11-11 DIAGNOSIS — Z87891 Personal history of nicotine dependence: Secondary | ICD-10-CM

## 2018-11-11 DIAGNOSIS — Z923 Personal history of irradiation: Secondary | ICD-10-CM

## 2018-11-11 DIAGNOSIS — I5023 Acute on chronic systolic (congestive) heart failure: Secondary | ICD-10-CM | POA: Diagnosis present

## 2018-11-11 DIAGNOSIS — Z466 Encounter for fitting and adjustment of urinary device: Secondary | ICD-10-CM | POA: Diagnosis not present

## 2018-11-11 HISTORY — DX: Acute kidney failure, unspecified: N17.9

## 2018-11-11 HISTORY — DX: Dyspnea, unspecified: R06.00

## 2018-11-11 HISTORY — DX: Heart failure, unspecified: I50.9

## 2018-11-11 HISTORY — DX: Acute myocardial infarction, unspecified: I21.9

## 2018-11-11 LAB — CBC WITH DIFFERENTIAL/PLATELET
Abs Immature Granulocytes: 0.03 10*3/uL (ref 0.00–0.07)
Basophils Absolute: 0 10*3/uL (ref 0.0–0.1)
Basophils Relative: 0 %
EOS ABS: 0 10*3/uL (ref 0.0–0.5)
Eosinophils Relative: 0 %
HEMATOCRIT: 33.1 % — AB (ref 39.0–52.0)
Hemoglobin: 10.5 g/dL — ABNORMAL LOW (ref 13.0–17.0)
Immature Granulocytes: 0 %
LYMPHS ABS: 0.8 10*3/uL (ref 0.7–4.0)
Lymphocytes Relative: 9 %
MCH: 30.9 pg (ref 26.0–34.0)
MCHC: 31.7 g/dL (ref 30.0–36.0)
MCV: 97.4 fL (ref 80.0–100.0)
Monocytes Absolute: 1.1 10*3/uL — ABNORMAL HIGH (ref 0.1–1.0)
Monocytes Relative: 12 %
Neutro Abs: 7.5 10*3/uL (ref 1.7–7.7)
Neutrophils Relative %: 79 %
Platelets: 201 10*3/uL (ref 150–400)
RBC: 3.4 MIL/uL — ABNORMAL LOW (ref 4.22–5.81)
RDW: 12.9 % (ref 11.5–15.5)
WBC: 9.5 10*3/uL (ref 4.0–10.5)
nRBC: 0 % (ref 0.0–0.2)

## 2018-11-11 LAB — COMPREHENSIVE METABOLIC PANEL
ALT: 19 U/L (ref 0–44)
AST: 21 U/L (ref 15–41)
Albumin: 3.1 g/dL — ABNORMAL LOW (ref 3.5–5.0)
Alkaline Phosphatase: 39 U/L (ref 38–126)
Anion gap: 14 (ref 5–15)
BUN: 25 mg/dL — AB (ref 8–23)
CO2: 24 mmol/L (ref 22–32)
Calcium: 8.5 mg/dL — ABNORMAL LOW (ref 8.9–10.3)
Chloride: 103 mmol/L (ref 98–111)
Creatinine, Ser: 2.42 mg/dL — ABNORMAL HIGH (ref 0.61–1.24)
GFR calc Af Amer: 28 mL/min — ABNORMAL LOW (ref >60)
GFR calc non Af Amer: 24 mL/min — ABNORMAL LOW (ref >60)
Glucose, Bld: 124 mg/dL — ABNORMAL HIGH (ref 70–99)
Potassium: 2.8 mmol/L — ABNORMAL LOW (ref 3.5–5.1)
Sodium: 141 mmol/L (ref 135–145)
Total Bilirubin: 0.7 mg/dL (ref 0.3–1.2)
Total Protein: 6.1 g/dL — ABNORMAL LOW (ref 6.5–8.1)

## 2018-11-11 LAB — BRAIN NATRIURETIC PEPTIDE: B Natriuretic Peptide: 1498 pg/mL — ABNORMAL HIGH (ref 0.0–100.0)

## 2018-11-11 LAB — URINALYSIS, ROUTINE W REFLEX MICROSCOPIC
Bilirubin Urine: NEGATIVE
Glucose, UA: NEGATIVE mg/dL
HGB URINE DIPSTICK: NEGATIVE
Ketones, ur: NEGATIVE mg/dL
Leukocytes, UA: NEGATIVE
Nitrite: NEGATIVE
Protein, ur: NEGATIVE mg/dL
SPECIFIC GRAVITY, URINE: 1.006 (ref 1.005–1.030)
pH: 5 (ref 5.0–8.0)

## 2018-11-11 LAB — I-STAT TROPONIN, ED
Troponin i, poc: 0.07 ng/mL (ref 0.00–0.08)
Troponin i, poc: 0.08 ng/mL (ref 0.00–0.08)

## 2018-11-11 LAB — LIPASE, BLOOD: Lipase: 23 U/L (ref 11–51)

## 2018-11-11 MED ORDER — FUROSEMIDE 10 MG/ML IJ SOLN
20.0000 mg | Freq: Once | INTRAMUSCULAR | Status: AC
  Administered 2018-11-12: 20 mg via INTRAVENOUS
  Filled 2018-11-11: qty 2

## 2018-11-11 MED ORDER — ASPIRIN EC 81 MG PO TBEC
81.0000 mg | DELAYED_RELEASE_TABLET | Freq: Two times a day (BID) | ORAL | Status: DC
  Administered 2018-11-12 – 2018-11-14 (×5): 81 mg via ORAL
  Filled 2018-11-11 (×5): qty 1

## 2018-11-11 MED ORDER — ADULT MULTIVITAMIN W/MINERALS CH
1.0000 | ORAL_TABLET | Freq: Every day | ORAL | Status: DC
  Administered 2018-11-12 – 2018-11-14 (×3): 1 via ORAL
  Filled 2018-11-11 (×3): qty 1

## 2018-11-11 MED ORDER — ASPIRIN 81 MG PO CHEW
162.0000 mg | CHEWABLE_TABLET | Freq: Once | ORAL | Status: AC
  Administered 2018-11-11: 162 mg via ORAL
  Filled 2018-11-11: qty 2

## 2018-11-11 MED ORDER — SODIUM CHLORIDE 0.9% FLUSH: 3.0000 mL | INTRAVENOUS | Status: DC | PRN

## 2018-11-11 MED ORDER — POTASSIUM CHLORIDE 10 MEQ/100ML IV SOLN
10.0000 meq | INTRAVENOUS | Status: AC
  Administered 2018-11-11 – 2018-11-12 (×4): 10 meq via INTRAVENOUS
  Filled 2018-11-11 (×4): qty 100

## 2018-11-11 MED ORDER — ACETAMINOPHEN 325 MG PO TABS: 650.0000 mg | ORAL_TABLET | ORAL | Status: DC | PRN

## 2018-11-11 MED ORDER — FUROSEMIDE 10 MG/ML IJ SOLN: 20.0000 mg | Freq: Once | INTRAMUSCULAR | Status: DC

## 2018-11-11 MED ORDER — HEPARIN SODIUM (PORCINE) 5000 UNIT/ML IJ SOLN
5000.0000 [IU] | Freq: Three times a day (TID) | INTRAMUSCULAR | Status: DC
  Administered 2018-11-11 – 2018-11-14 (×7): 5000 [IU] via SUBCUTANEOUS
  Filled 2018-11-11 (×7): qty 1

## 2018-11-11 MED ORDER — POTASSIUM CHLORIDE CRYS ER 20 MEQ PO TBCR
40.0000 meq | EXTENDED_RELEASE_TABLET | Freq: Once | ORAL | Status: AC
  Administered 2018-11-11: 40 meq via ORAL
  Filled 2018-11-11: qty 2

## 2018-11-11 MED ORDER — SODIUM CHLORIDE 0.9 % IV SOLN: 250.0000 mL | INTRAVENOUS | Status: DC | PRN

## 2018-11-11 MED ORDER — FISH OIL 1000 MG PO CAPS: 1000.0000 mg | ORAL_CAPSULE | Freq: Every day | ORAL | Status: DC

## 2018-11-11 MED ORDER — ONDANSETRON HCL 4 MG/2ML IJ SOLN: 4.0000 mg | Freq: Four times a day (QID) | INTRAMUSCULAR | Status: DC | PRN

## 2018-11-11 MED ORDER — PRAVASTATIN SODIUM 10 MG PO TABS
20.0000 mg | ORAL_TABLET | Freq: Every day | ORAL | Status: DC
  Administered 2018-11-12 – 2018-11-13 (×2): 20 mg via ORAL
  Filled 2018-11-11 (×3): qty 2

## 2018-11-11 MED ORDER — SODIUM CHLORIDE 0.9% FLUSH
3.0000 mL | Freq: Two times a day (BID) | INTRAVENOUS | Status: DC
  Administered 2018-11-11 – 2018-11-14 (×6): 3 mL via INTRAVENOUS

## 2018-11-11 MED ORDER — METOPROLOL TARTRATE 25 MG PO TABS
25.0000 mg | ORAL_TABLET | Freq: Two times a day (BID) | ORAL | Status: DC
  Administered 2018-11-12 – 2018-11-14 (×5): 25 mg via ORAL
  Filled 2018-11-11 (×6): qty 1

## 2018-11-11 NOTE — H&P (Signed)
History and Physical    Ronald Kaiser FIE:[id number] DOB: 07/01/1934 DOA: 11/11/2018  PCP: Christain [place], MD  Patient coming from: Home  I have personally briefly reviewed patient's old medical records in [place]  Chief Complaint: SOB  HPI: Ronald Kaiser is a 85 y.o. male with medical history significant of HTN, MI, CAD, EF 30-35% in 2017.  Patient had gradually worsening DOE and increased BLE edema over past weeks.  Wife, who has CHF, recognized symptoms and attempted treatment at home.  20mg  PO lasix on Thurs and Fri last week, 40mg  PO lasix on Sat, 20mg  PO lasix on Sunday.  Good UOP improvement in BLE edema, DOE and SOB still persistent however.  Patient brought in to ED.   ED Course: CXR confirms pulm edema, BNP 1500, Creat 2.4 up from 0.9 baseline in Aug last year.  K 2.8.   Review of Systems: As per HPI otherwise 10 point review of systems negative.   Past Medical History:  Diagnosis Date  . CAD (coronary artery disease)   . HTN (hypertension)   . Hyperlipidemia   . Myocardial infarction ([place]) 2000  . Obesity   . Prostate cancer [facility])     Past Surgical History:  Procedure Laterality Date  . CARDIAC CATHETERIZATION N/A 03/09/2016   Procedure: Left Heart Cath and Coronary Angiography;  Surgeon: Belva Crome, MD;  Location: [facility];  Service: Cardiovascular;  Laterality: N/A;  . Stenting of LAD       reports that he quit smoking about 56 years ago. He has never used smokeless tobacco. He reports previous alcohol use. He reports previous drug use.  No Known Allergies  History reviewed. No pertinent family history.   Prior to Admission medications   Medication Sig Start Date End Date Taking? Authorizing Provider  aspirin EC 81 MG tablet Take 81 mg by mouth 2 (two) times daily after a meal.   Yes [provider]  Coenzyme Q10 (CO Q 10) 100 MG CAPS Take 100 mg by mouth daily after supper.    Yes [provider]  lisinopril  (PRINIVIL,ZESTRIL) 20 MG tablet Take 1 tablet (20 mg total) by mouth daily. Patient taking differently: Take 20 mg by mouth daily after breakfast.  07/05/18  Yes Crenshaw, Denice Bors, MD  metoprolol tartrate (LOPRESSOR) 50 MG tablet Take 1 tablet (50 mg total) by mouth 2 (two) times daily. Patient taking differently: Take 50 mg by mouth 2 (two) times daily after a meal.  07/09/18  Yes Crenshaw, Denice Bors, MD  Multiple Vitamin (MULTIVITAMIN WITH MINERALS) TABS tablet Take 1 tablet by mouth daily after breakfast. Centrum Silver   Yes [provider]  nitroGLYCERIN (NITROSTAT) 0.4 MG SL tablet Place 1 tablet (0.4 mg total) under the tongue every 5 (five) minutes as needed. Patient taking differently: Place 0.4 mg under the tongue every 5 (five) minutes as needed for chest pain.  04/17/17  Yes Lelon Perla, MD  Omega-3 Fatty Acids (FISH OIL) 1000 MG CAPS Take 1,000 mg by mouth daily after breakfast.    Yes [provider]  pravastatin (PRAVACHOL) 20 MG tablet Take 1 tablet (20 mg total) by mouth daily. Patient taking differently: Take 20 mg by mouth daily after supper.  07/09/18  Yes Lelon Perla, MD    Physical Exam: Vitals:   11/11/18 2000 11/11/18 2015 11/11/18 2030 11/11/18 2045  BP: 123/71 134/75 126/62 128/63  Pulse: (!) 51 (!) 56 (!) 48 61  Resp:  19 (!) 26 (!) 22 (!) 23  Temp:      TempSrc:      SpO2: 96% 96% 93% 93%  Weight:      Height:        Constitutional: NAD, calm, comfortable Eyes: PERRL, lids and conjunctivae normal ENMT: Mucous membranes are moist. Posterior pharynx clear of any exudate or lesions.Normal dentition.  Neck: normal, supple, no masses, no thyromegaly Respiratory: Decreased breath sounds Cardiovascular: Regular rate and rhythm, no murmurs / rubs / gallops. 1+ BLE edema 2+ pedal pulses. No carotid bruits.  Abdomen: no tenderness, no masses palpated. No hepatosplenomegaly. Bowel sounds positive.  Musculoskeletal: no clubbing / cyanosis. No  joint deformity upper and lower extremities. Good ROM, no contractures. Normal muscle tone.  Skin: no rashes, lesions, ulcers. No induration Neurologic: CN 2-12 grossly intact. Sensation intact, DTR normal. Strength 5/5 in all 4.  Psychiatric: Normal judgment and insight. Alert and oriented x 3. Normal mood.    Labs on Admission: I have personally reviewed following labs and imaging studies  CBC: Recent Labs  Lab 11/11/18 1758  WBC 9.5  NEUTROABS 7.5  HGB 10.5*  HCT 33.1*  MCV 97.4  PLT 573   Basic Metabolic Panel: Recent Labs  Lab 11/11/18 1758  NA 141  K 2.8*  CL 103  CO2 24  GLUCOSE 124*  BUN 25*  CREATININE 2.42*  CALCIUM 8.5*   GFR: Estimated Creatinine Clearance: 26.6 mL/min (A) (by C-G formula based on SCr of 2.42 mg/dL (H)). Liver Function Tests: Recent Labs  Lab 11/11/18 1758  AST 21  ALT 19  ALKPHOS 39  BILITOT 0.7  PROT 6.1*  ALBUMIN 3.1*   Recent Labs  Lab 11/11/18 1758  LIPASE 23   No results for input(s): AMMONIA in the last 168 hours. Coagulation Profile: No results for input(s): INR, PROTIME in the last 168 hours. Cardiac Enzymes: No results for input(s): CKTOTAL, CKMB, CKMBINDEX, TROPONINI in the last 168 hours. BNP (last 3 results) No results for input(s): PROBNP in the last 8760 hours. HbA1C: No results for input(s): HGBA1C in the last 72 hours. CBG: No results for input(s): GLUCAP in the last 168 hours. Lipid Profile: No results for input(s): CHOL, HDL, LDLCALC, TRIG, CHOLHDL, LDLDIRECT in the last 72 hours. Thyroid Function Tests: No results for input(s): TSH, T4TOTAL, FREET4, T3FREE, THYROIDAB in the last 72 hours. Anemia Panel: No results for input(s): VITAMINB12, FOLATE, FERRITIN, TIBC, IRON, RETICCTPCT in the last 72 hours. Urine analysis:    Component Value Date/Time   COLORURINE YELLOW 11/11/2018 1858   APPEARANCEUR CLEAR 11/11/2018 1858   LABSPEC 1.006 11/11/2018 1858   PHURINE 5.0 11/11/2018 1858   GLUCOSEU  NEGATIVE 11/11/2018 1858   HGBUR NEGATIVE 11/11/2018 1858   BILIRUBINUR NEGATIVE 11/11/2018 1858   KETONESUR NEGATIVE 11/11/2018 1858   PROTEINUR NEGATIVE 11/11/2018 1858   NITRITE NEGATIVE 11/11/2018 1858   LEUKOCYTESUR NEGATIVE 11/11/2018 1858    Radiological Exams on Admission: Dg Chest Portable 1 View  Result Date: 11/11/2018 CLINICAL DATA:  Shortness of breath, bilateral lower extremity swelling EXAM: PORTABLE CHEST 1 VIEW COMPARISON:  03/07/2016 FINDINGS: Pulmonary vascular congestion with bilateral perihilar opacities, left greater than right, suspicious for mild interstitial edema. Technically, left upper lobe/perihilar infection is possible. Suspected small bilateral pleural effusions.  No pneumothorax. The heart is normal in size.  Thoracic aortic atherosclerosis. IMPRESSION: Suspected mild interstitial edema. Left upper lobe/perihilar infection is possible. Small bilateral pleural effusions. Electronically Signed   By: Henderson Newcomer.D.  On: 11/11/2018 18:49    EKG: Independently reviewed.  No significant change from Aug, though slightly bradycardic at 56.  Assessment/Plan Principal Problem:   Acute on chronic systolic CHF (congestive heart failure) (HCC) Active Problems:   Hyperlipidemia   CAD (coronary artery disease)   Hypertension   AKI (acute kidney injury) ([place])    1. Acute on chronic systolic CHF - 1. CHF pathway 2. Due to concern for new AKI with the CHF spoke with Dr. Alveta Heimlich 1. AKI could be due to fluid overload or could be due to diuresis. 2. Try 20mg  IV lasix for tonight, will schedule this for 0100 to give [place] a few hours to get K into the patient 3. Replace 80 meq K 4. 2d echo in AM 5. See how kidneys respond to diuresis 6. And they will re-eval tomorrow 3. Strict intake and output 4. BMP daily 2. AKI - 1. See above: due to diuresis vs fluid overload 2. If this worsens on repeat BMP in AM, needs Nephrology consult 3. HTN - 1. Hold  lisinopril 2. And Reduce dose of metoprolol to 25mg  PO BID since he is slightly bradycardic in the 50s here in the ED 4. Hypokalemia - 1. Replacing: 59meq PO and 33meq IV 2. Repeat BMP in AM 5. HLD - continue statin  DVT prophylaxis: Heparin [place] Code Status: Full Code Family Communication: No family in room Disposition Plan: Home after admit Consults called: Cards to see in AM Admission status: Admit to inpatient  Severity of Illness: The appropriate patient status for this patient is INPATIENT. Inpatient status is judged to be reasonable and necessary in order to provide the required intensity of service to ensure the patient's safety. The patient's presenting symptoms, physical exam findings, and initial radiographic and laboratory data in the context of their chronic comorbidities is felt to place them at high risk for further clinical deterioration. Furthermore, it is not anticipated that the patient will be medically stable for discharge from the hospital within 2 midnights of admission. The following factors support the patient status of inpatient.   " The patient's presenting symptoms include SOB, peripheral edema. " The initial radiographic and laboratory data are worrisome because of Pulmonary edema, BNP 1500, new AKI with creat 2.4 up from 0.9 is complicating the picture.   * I certify that at the point of admission it is my clinical judgment that the patient will require inpatient hospital care spanning beyond 2 midnights from the point of admission due to high intensity of service, high risk for further deterioration and high frequency of surveillance required.*    GARDNER, JARED M. DO [facility]  How to contact the [facility] Attending or Consulting provider [place] or covering provider during after hours [place], for this patient?  1. Check the care team in [facility] and look for a) attending/consulting TRH provider listed and b) the [facility] team listed 2. Log into [email or web address]   Amion Physician Scheduling and messaging for groups and whole hospitals  On call and physician scheduling software for group practices, residents, hospitalists and other medical providers for call, clinic, rotation and shift schedules. OnCall [employer] is a hospital-wide system for scheduling doctors and paging doctors on call. EasyPlot is for scientific plotting and data analysis.  [email or web address]  and use [place]'s universal password to access. If you do not have the password, please contact the hospital operator.  3. Locate the [facility] provider you are looking for under [facility] and page to a  number that you can be directly reached. 4. If you still have difficulty reaching the provider, please page the [facility] (Director on Call) for the Hospitalists listed on amion for assistance.  11/11/2018, 9:18 PM

## 2018-11-11 NOTE — ED Triage Notes (Signed)
Pt from home with c/o shob and bilateral lower extremity swelling.   Pt has a hx of HTN and is compliant with medications.

## 2018-11-11 NOTE — ED Provider Notes (Signed)
[facility] EMERGENCY DEPARTMENT Provider Note   CSN: [id number] Arrival date & time: 11/11/18  1734     History   Chief Complaint No chief complaint on file.   HPI Ronald Kaiser is a 85 y.o. male.  HPI   Ronald Kaiser is a 85 y.o. male with PMH of CAD status post stent x2, HTN, HLD, obesity, prostate cancer status post radiation who presents with dyspnea and fatigue.  He reports he has been distant for about a week and is worse with exertion.  No chest pain or pressure and no lightheadedness.  No diaphoresis.  No nausea but has very very little appetite.  No abdominal distention but does report aching type pain intermittently in his suprapubic space.  Reports urinary hesitancy which is at its baseline.  No dysuria or hematuria.  No back pain or fevers.  No history of UTI.  He had significant swelling in his lower extremities bilaterally but he took his wife's Lasix (20 mg on Thursday and Friday and then 40 mg yesterday, 20 mg p.o. today) with significant improvement in his edema.  Some improvement in his shortness of breath.  Has not taken any nitroglycerin.  Takes 81 mg of aspirin BID and (took 162 today).  Past Medical History:  Diagnosis Date  . CAD (coronary artery disease)   . HTN (hypertension)   . Hyperlipidemia   . Myocardial infarction ([place]) 2000  . Obesity   . Prostate cancer [facility]     Patient Active Problem List   Diagnosis Date Noted  . Acute on chronic systolic CHF (congestive heart failure) ([place]) 11/11/2018  . AKI (acute kidney injury) ([place]) 11/11/2018  . Abnormal nuclear cardiac imaging test   . Bruit 11/23/2012  . 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 Hyperlipidemia 11/25/2011  . CAD (coronary artery disease) 11/25/2011  . Hypertension 11/25/2011    Past Surgical History:  Procedure Laterality Date  . CARDIAC CATHETERIZATION N/A 03/09/2016   Procedure: Left Heart Cath and Coronary Angiography;  Surgeon: Belva Crome, MD;   Location: [facility];  Service: Cardiovascular;  Laterality: N/A;  . Stenting of LAD          Home Medications    Prior to Admission medications   Medication Sig Start Date End Date Taking? Authorizing Provider  aspirin EC 81 MG tablet Take 81 mg by mouth 2 (two) times daily after a meal.   Yes [provider]  Coenzyme Q10 (CO Q 10) 100 MG CAPS Take 100 mg by mouth daily after supper.    Yes [provider]  lisinopril (PRINIVIL,ZESTRIL) 20 MG tablet Take 1 tablet (20 mg total) by mouth daily. Patient taking differently: Take 20 mg by mouth daily after breakfast.  07/05/18  Yes Crenshaw, Denice Bors, MD  metoprolol tartrate (LOPRESSOR) 50 MG tablet Take 1 tablet (50 mg total) by mouth 2 (two) times daily. Patient taking differently: Take 50 mg by mouth 2 (two) times daily after a meal.  07/09/18  Yes Crenshaw, Denice Bors, MD  Multiple Vitamin (MULTIVITAMIN WITH MINERALS) TABS tablet Take 1 tablet by mouth daily after breakfast. Centrum Silver   Yes [provider]  nitroGLYCERIN (NITROSTAT) 0.4 MG SL tablet Place 1 tablet (0.4 mg total) under the tongue every 5 (five) minutes as needed. Patient taking differently: Place 0.4 mg under the tongue every 5 (five) minutes as needed for chest pain.  04/17/17  Yes Crenshaw, Denice Bors, MD  Omega-3 Fatty Acids (FISH OIL)  1000 MG CAPS Take 1,000 mg by mouth daily after breakfast.    Yes [provider]  pravastatin (PRAVACHOL) 20 MG tablet Take 1 tablet (20 mg total) by mouth daily. Patient taking differently: Take 20 mg by mouth daily after supper.  07/09/18  Yes Lelon Perla, MD    Family History History reviewed. No pertinent family history.  Social History Social History   Tobacco Use  . Smoking status: Former Smoker    Last attempt to quit: 1964    Years since quitting: 56.1  . Smokeless tobacco: Never Used  Substance Use Topics  . Alcohol use: Not Currently  . Drug use: Not Currently      Allergies   Patient has no known allergies.   Review of Systems Review of Systems  Constitutional: Positive for activity change, appetite change and fatigue. Negative for chills and fever.  HENT: Negative for ear pain and sore throat.   Eyes: Negative for pain and visual disturbance.  Respiratory: Positive for cough and shortness of breath.   Cardiovascular: Positive for leg swelling. Negative for chest pain and palpitations.  Gastrointestinal: Positive for abdominal pain. Negative for nausea and vomiting.  Genitourinary: Negative for dysuria and hematuria.  Musculoskeletal: Negative for arthralgias and back pain.  Skin: Negative for color change and rash.  Neurological: Negative for seizures and syncope.  All other systems reviewed and are negative.    Physical Exam Updated Vital Signs BP 128/63   Pulse 61   Temp 98.3 F (36.8 C) (Oral)   Resp (!) 23   Ht 6\' 1"  (1.854 m)   Wt 95.7 kg   SpO2 93%   BMI 27.84 kg/m   Physical Exam Vitals signs and nursing note reviewed.  Constitutional:      Appearance: Normal appearance. He is well-developed. He is not diaphoretic.  HENT:     Head: Normocephalic and atraumatic.  Eyes:     Conjunctiva/sclera: Conjunctivae normal.  Neck:     Musculoskeletal: Neck supple.  Cardiovascular:     Rate and Rhythm: Normal rate and regular rhythm.     Heart sounds: No murmur.  Pulmonary:     Effort: Pulmonary effort is normal. No respiratory distress.     Breath sounds: Examination of the right-lower field reveals decreased breath sounds. Examination of the left-lower field reveals decreased breath sounds. Decreased breath sounds present.  Abdominal:     Palpations: Abdomen is soft.     Tenderness: There is no abdominal tenderness.  Musculoskeletal:     Right lower leg: Edema (1+ pitting mid shin) present.     Left lower leg: Edema (1+ pitting mid shin) present.  Skin:    General: Skin is warm and dry.  Neurological:     Mental  Status: He is alert.  Psychiatric:        Behavior: Behavior is cooperative.      ED Treatments / Results  Labs (all labs ordered are listed, but only abnormal results are displayed) Labs Reviewed  CBC WITH DIFFERENTIAL/PLATELET - Abnormal; Notable for the following components:      Result Value   RBC 3.40 (*)    Hemoglobin 10.5 (*)    HCT 33.1 (*)    Monocytes Absolute 1.1 (*)    All other components within normal limits  COMPREHENSIVE METABOLIC PANEL - Abnormal; Notable for the following components:   Potassium 2.8 (*)    Glucose, Bld 124 (*)    BUN 25 (*)    Creatinine, Ser  2.42 (*)    Calcium 8.5 (*)    Total Protein 6.1 (*)    Albumin 3.1 (*)    GFR calc non Af Amer 24 (*)    GFR calc Af Amer 28 (*)    All other components within normal limits  BRAIN NATRIURETIC PEPTIDE - Abnormal; Notable for the following components:   B Natriuretic Peptide 1,498.0 (*)    All other components within normal limits  LIPASE, BLOOD  URINALYSIS, ROUTINE W REFLEX MICROSCOPIC  BASIC METABOLIC PANEL  I-STAT TROPONIN, ED  I-STAT TROPONIN, ED    EKG None  Radiology Dg Chest Portable 1 View  Result Date: 11/11/2018 CLINICAL DATA:  Shortness of breath, bilateral lower extremity swelling EXAM: PORTABLE CHEST 1 VIEW COMPARISON:  03/07/2016 FINDINGS: Pulmonary vascular congestion with bilateral perihilar opacities, left greater than right, suspicious for mild interstitial edema. Technically, left upper lobe/perihilar infection is possible. Suspected small bilateral pleural effusions.  No pneumothorax. The heart is normal in size.  Thoracic aortic atherosclerosis. IMPRESSION: Suspected mild interstitial edema. Left upper lobe/perihilar infection is possible. Small bilateral pleural effusions. Electronically Signed   By: Julian Hy M.D.   On: 11/11/2018 18:49    Procedures Procedures (including critical care time)  Medications Ordered in ED Medications  potassium chloride 10 mEq in  100 mL IVPB (10 mEq Intravenous New Bag/Given 11/11/18 2228)  sodium chloride flush (NS) 0.9 % injection 3 mL (has no administration in time range)  sodium chloride flush (NS) 0.9 % injection 3 mL (has no administration in time range)  0.9 %  sodium chloride infusion (has no administration in time range)  acetaminophen (TYLENOL) tablet 650 mg (has no administration in time range)  ondansetron (ZOFRAN) injection 4 mg (has no administration in time range)  heparin injection 5,000 Units (has no administration in time range)  aspirin EC tablet 81 mg (has no administration in time range)  multivitamin with minerals tablet 1 tablet (has no administration in time range)  pravastatin (PRAVACHOL) tablet 20 mg (has no administration in time range)  metoprolol tartrate (LOPRESSOR) tablet 25 mg (has no administration in time range)  furosemide (LASIX) injection 20 mg (has no administration in time range)  aspirin chewable tablet 162 mg (162 mg Oral Given 11/11/18 1924)  potassium chloride SA (K-DUR,KLOR-CON) CR tablet 40 mEq (40 mEq Oral Given 11/11/18 2008)     Initial Impression / Assessment and Plan / ED Course  I have reviewed the triage vital signs and the nursing notes.  Pertinent labs & imaging results that were available during my care of the patient were reviewed by me and considered in my medical decision making (see chart for details).     MDM:  Imaging: Chest x-ray shows suspected mild residual edema with left upper lobe and perihilar infection possible.  Small bilateral pleural effusions.  ED Provider Interpretation of EKG: Sinus bradycardia with a heart rate of 53 bpm, left axis deviation, no ST segment elevation or depression or pathologic T wave abnormalities.  Right bundle branch block.  Labs: BNP 1500, lipase 23, CMP with a K of 2.8 and creatinine 2.4 (baseline around 0.9-1), CBC with a hemoglobin of 10.5 otherwise unremarkable, troponin 0.07, UA negative  On initial evaluation,  patient appears ill. Afebrile and hemodynamically stable although mildly tachypneic and satting in the high 80s on room air.  Placed on 2 L nasal cannula with improvement in tachypnea. Alert and oriented x4, pleasant, and cooperative.  Presents with dyspnea as detailed above.  On  chart review shows patient had left heart cath on March 09, 2016 showing mild to moderate LAD in-stent restenosis without evidence for high-grade obstruction.  Decreased LVEF at around 45 to 50%.  Abnormal and high risk myocardial perfusion imaging most likely explained by decreased EF and scarring from prior infarction as documented in the impression from cath.  On exam, patient has 1+ pedal edema bilaterally.  He has been taking his wife's Lasix at home and feels his edema has improved in this time.  Chest x-ray shows evidence for CHF and BNP is significantly elevated at 1500 as detailed above.  EKG without evidence for acute ischemia or arrhythmia.  Troponin negative.  Doubt ACS.  No evidence of pericarditis or myocarditis.  No evidence for pneumonia or pneumothorax.  Patient appears after she liters nasal cannula.  Low suspicion for PE.  Most consistent with acute congestive heart failure.  Unclear if patient's acute kidney injury is improved or worsened since he started Lasix at home.  He may have been further off of the curve and improved after diuresis at home given that his BUN is disproportionately low compared to his creatinine at 25.  However, will hold Lasix at this time until consultation with admitting team.  Patient was admitted to the hospitalist service and Lasix administration deferred to their plan.  Admitted in improved condition in the ED.  The plan for this patient was discussed with Dr. Zenia Resides who voiced agreement and who oversaw evaluation and treatment of this patient.   The patient was fully informed and involved with the history taking, evaluation, workup including labs/images, and plan. The patient's concerns  and questions were addressed to the patient's satisfaction and he expressed agreement with the plan to admit.    Final Clinical Impressions(s) / ED Diagnoses   Final diagnoses:  Acute combined systolic and diastolic congestive heart failure ([place])  Acute kidney injury ([place])  Acute respiratory failure with hypoxia [facility])    ED Discharge Orders    None       Teriana Danker, Rodena Goldmann, MD 11/11/18 2236    Lacretia Leigh, MD 11/12/18 1332

## 2018-11-11 NOTE — ED Provider Notes (Addendum)
I saw and evaluated the patient, reviewed the resident's note and I agree with the findings and plan.  EKG: None 85 year old male presents with worsening dyspnea exertion and increased bilateral lower extremity edema.  Chest x-ray consistent with CHF.  Will give diuretics and admit to the hospital  CRITICAL CARE Performed by: Leota Jacobsen Total critical care time: 50 minutes Critical care time was exclusive of separately billable procedures and treating other patients. Critical care was necessary to treat or prevent imminent or life-threatening deterioration. Critical care was time spent personally by me on the following activities: development of treatment plan with patient and/or surrogate as well as nursing, discussions with consultants, evaluation of patient's response to treatment, examination of patient, obtaining history from patient or surrogate, ordering and performing treatments and interventions, ordering and review of laboratory studies, ordering and review of radiographic studies, pulse oximetry and re-evaluation of patient's condition.    Lacretia Leigh, MD 11/11/18 Lanetta Inch    Lacretia Leigh, MD 11/26/18 2322

## 2018-11-11 NOTE — ED Notes (Signed)
O2 sat noted to be 88-90% on assessment of patient.  Placed on 2L oxygen via [place].  Sat incressed to 93% immediately.  Family remains at the bedside.

## 2018-11-12 ENCOUNTER — Inpatient Hospital Stay (HOSPITAL_COMMUNITY): Payer: Medicare Other

## 2018-11-12 ENCOUNTER — Encounter (HOSPITAL_COMMUNITY): Payer: Self-pay | Admitting: Cardiology

## 2018-11-12 DIAGNOSIS — N179 Acute kidney failure, unspecified: Secondary | ICD-10-CM

## 2018-11-12 DIAGNOSIS — E876 Hypokalemia: Secondary | ICD-10-CM

## 2018-11-12 DIAGNOSIS — N1339 Other hydronephrosis: Secondary | ICD-10-CM

## 2018-11-12 DIAGNOSIS — I5041 Acute combined systolic (congestive) and diastolic (congestive) heart failure: Secondary | ICD-10-CM

## 2018-11-12 HISTORY — DX: Acute kidney failure, unspecified: N17.9

## 2018-11-12 LAB — TROPONIN I
Troponin I: 0.06 ng/mL (ref <0.03)
Troponin I: 0.05 ng/mL (ref <0.03)
Troponin I: 0.06 ng/mL (ref <0.03)

## 2018-11-12 LAB — SODIUM, URINE, RANDOM: Sodium, Ur: 93 mmol/L

## 2018-11-12 LAB — BASIC METABOLIC PANEL
Anion gap: 12 (ref 5–15)
BUN: 29 mg/dL — ABNORMAL HIGH (ref 8–23)
CO2: 21 mmol/L — ABNORMAL LOW (ref 22–32)
Calcium: 7.9 mg/dL — ABNORMAL LOW (ref 8.9–10.3)
Chloride: 107 mmol/L (ref 98–111)
Creatinine, Ser: 2.9 mg/dL — ABNORMAL HIGH (ref 0.61–1.24)
GFR calc Af Amer: 22 mL/min — ABNORMAL LOW (ref >60)
GFR calc non Af Amer: 19 mL/min — ABNORMAL LOW (ref >60)
Glucose, Bld: 125 mg/dL — ABNORMAL HIGH (ref 70–99)
Potassium: 3.2 mmol/L — ABNORMAL LOW (ref 3.5–5.1)
Sodium: 140 mmol/L (ref 135–145)

## 2018-11-12 LAB — PROTEIN, URINE, RANDOM: Total Protein, Urine: 11 mg/dL

## 2018-11-12 LAB — CREATININE, URINE, RANDOM: Creatinine, Urine: 26.23 mg/dL

## 2018-11-12 MED ORDER — FUROSEMIDE 10 MG/ML IJ SOLN
40.0000 mg | Freq: Two times a day (BID) | INTRAMUSCULAR | Status: DC
  Administered 2018-11-12: 40 mg via INTRAVENOUS
  Filled 2018-11-12: qty 4

## 2018-11-12 MED ORDER — POTASSIUM CHLORIDE CRYS ER 20 MEQ PO TBCR
40.0000 meq | EXTENDED_RELEASE_TABLET | Freq: Once | ORAL | Status: AC
  Administered 2018-11-12: 40 meq via ORAL
  Filled 2018-11-12: qty 2

## 2018-11-12 MED ORDER — FUROSEMIDE 10 MG/ML IJ SOLN
100.0000 mg | Freq: Two times a day (BID) | INTRAVENOUS | Status: DC
  Administered 2018-11-12: 100 mg via INTRAVENOUS
  Filled 2018-11-12 (×2): qty 10

## 2018-11-12 MED ORDER — TAMSULOSIN HCL 0.4 MG PO CAPS
0.4000 mg | ORAL_CAPSULE | Freq: Every day | ORAL | Status: DC
  Administered 2018-11-12 – 2018-11-13 (×2): 0.4 mg via ORAL
  Filled 2018-11-12 (×2): qty 1

## 2018-11-12 NOTE — ED Notes (Signed)
Dr Marthenia Rolling called in reference to text page.  Discussed need for urology consult.

## 2018-11-12 NOTE — Consult Note (Signed)
Cardiology Consultation:   Patient ID: Ronald Kaiser MRN: [id number]; DOB: May 21, 1934  Admit date: 11/11/2018 Date of Consult: 11/12/2018  Primary Care Provider: Christain Sacramento, MD Primary Cardiologist: Kirk Ruths, MD  Primary Electrophysiologist:  None    Patient Profile:   Ronald Kaiser is a 85 y.o. male with a hx of HTN, HL, CAD, chronic systolic HF (86-76%) who is being seen today for the evaluation of HF at the request of Dr Marthenia Rolling.  History of Present Illness:   Mr. Ronald Kaiser is an 85 yo male with PMH of HTN, HL, CAD, chronic systolic HF (72-09%). He underwent cardiac cath in 2000 with stenting of the PAD and PTCA of the 2nd diag. Echo in 4/17 showed an EF of 30-35% with mild aortic and mitral regurgotation and a mildly dilated aortic root. Had a stress test in 5/17 showing an EF of 42% in inferoseptal ischemia and prior infarct. Follow up cardiac cath showed 50% pRCA, 45% to d-pLAD and 50% Lcx. He was last seen in the office on 8/19 with Dr. Stanford Breed and reported continued dyspnea with extreme activities but not ADLs. Denied any chest pain.   Reports historically he has not been on lasix at home. States he did remember taking lasix back with his first MI back in 2000. He began to notice symptoms about 2 weeks ago. Weighed himself at 202. Noticed his legs were swelling, and felt fuller with decreased appetite. He started taking his wife's lasix last Thursday. States he was urinating well but still felt short of breath. Yesterday his breathing became worse. He felt tight in his chest. His wife convinced him to come to the ED.   In the ED his labs showed Na+ 141, K+ 2.8, Cr 2.42, Albumin 3.1, BNP 1498,  Hgb 10.5, POC trop 0.07>>0.08. EKG SR with RBBB and prolonged QT. CXR with edema and bilateral pleural effusions. Given IV runs of K+ with improvement to 3.2, Cr >>2.9. He was given 20mg  IV lasix x1. Admitted to IM for further management.   Past Medical History:  Diagnosis Date  . CAD  (coronary artery disease)   . HTN (hypertension)   . Hyperlipidemia   . Myocardial infarction ([place]) 2000  . Obesity   . Prostate cancer [facility])     Past Surgical History:  Procedure Laterality Date  . CARDIAC CATHETERIZATION N/A 03/09/2016   Procedure: Left Heart Cath and Coronary Angiography;  Surgeon: Belva Crome, MD;  Location: [place];  Service: Cardiovascular;  Laterality: N/A;  . Stenting of LAD       Home Medications:  Prior to Admission medications   Medication Sig Start Date End Date Taking? Authorizing Provider  aspirin EC 81 MG tablet Take 81 mg by mouth 2 (two) times daily after a meal.   Yes [provider]  Coenzyme Q10 (CO Q 10) 100 MG CAPS Take 100 mg by mouth daily after supper.    Yes [provider]  lisinopril (PRINIVIL,ZESTRIL) 20 MG tablet Take 1 tablet (20 mg total) by mouth daily. Patient taking differently: Take 20 mg by mouth daily after breakfast.  07/05/18  Yes Crenshaw, Denice Bors, MD  metoprolol tartrate (LOPRESSOR) 50 MG tablet Take 1 tablet (50 mg total) by mouth 2 (two) times daily. Patient taking differently: Take 50 mg by mouth 2 (two) times daily after a meal.  07/09/18  Yes Crenshaw, Denice Bors, MD  Multiple Vitamin (MULTIVITAMIN WITH MINERALS) TABS tablet Take 1 tablet by mouth daily after breakfast.  Centrum Silver   Yes [provider]  nitroGLYCERIN (NITROSTAT) 0.4 MG SL tablet Place 1 tablet (0.4 mg total) under the tongue every 5 (five) minutes as needed. Patient taking differently: Place 0.4 mg under the tongue every 5 (five) minutes as needed for chest pain.  04/17/17  Yes Lelon Perla, MD  Omega-3 Fatty Acids (FISH OIL) 1000 MG CAPS Take 1,000 mg by mouth daily after breakfast.    Yes [provider]  pravastatin (PRAVACHOL) 20 MG tablet Take 1 tablet (20 mg total) by mouth daily. Patient taking differently: Take 20 mg by mouth daily after supper.  07/09/18  Yes Lelon Perla, MD    Inpatient  Medications: Scheduled Meds: . aspirin EC  81 mg Oral BID PC  . furosemide  40 mg Intravenous BID  . heparin  5,000 Units Subcutaneous Q8H  . metoprolol tartrate  25 mg Oral BID  . multivitamin with minerals  1 tablet Oral QPC breakfast  . pravastatin  20 mg Oral QPC supper  . sodium chloride flush  3 mL Intravenous Q12H   Continuous Infusions: . sodium chloride     PRN Meds: sodium chloride, acetaminophen, ondansetron (ZOFRAN) IV, sodium chloride flush  Allergies:   No Known Allergies  Social History:   Social History   Socioeconomic History  . Marital status: Married    Spouse name: Not on file  . Number of children: Not on file  . Years of education: Not on file  . Highest education level: Not on file  Occupational History  . Not on file  Social Needs  . Financial resource strain: Not on file  . Food insecurity:    Worry: Not on file    Inability: Not on file  . Transportation needs:    Medical: Not on file    Non-medical: Not on file  Tobacco Use  . Smoking status: Former Smoker    Last attempt to quit: 1964    Years since quitting: 56.1  . Smokeless tobacco: Never Used  Substance and Sexual Activity  . Alcohol use: Not Currently  . Drug use: Not Currently  . Sexual activity: Not on file  Lifestyle  . Physical activity:    Days per week: Not on file    Minutes per session: Not on file  . Stress: Not on file  Relationships  . Social connections:    Talks on phone: Not on file    Gets together: Not on file    Attends religious service: Not on file    Active member of club or organization: Not on file    Attends meetings of clubs or organizations: Not on file    Relationship status: Not on file  . Intimate partner violence:    Fear of current or ex partner: Not on file    Emotionally abused: Not on file    Physically abused: Not on file    Forced sexual activity: Not on file  Other Topics Concern  . Not on file  Social History Narrative  . Not on  file    Family History:   Family History  Problem Relation Age of Onset  . Hypertension Father     ROS:  Please see the history of present illness.   All other ROS reviewed and negative.     Physical Exam/Data:   Vitals:   11/12/18 0627 11/12/18 0654 11/12/18 0925 11/12/18 1054  BP: 132/69 (!) 113/49 (!) 137/59 129/71  Pulse: (!) 56 65 77  64  Resp: 16 18  (!) 24  Temp:      TempSrc:      SpO2: 96% 96%  95%  Weight:      Height:        Intake/Output Summary (Last 24 hours) at 11/12/2018 1211 Last data filed at 11/12/2018 1141 Gross per 24 hour  Intake 250 ml  Output -  Net 250 ml   Last 3 Weights 11/11/2018 05/07/2018 04/17/2017  Weight (lbs) 211 lb 206 lb 210 lb  Weight (kg) 95.709 kg 93.441 kg 95.255 kg     Body mass index is 27.84 kg/m.  General:  Well nourished, well developed, in no acute distress. On [place]  HEENT: normal Lymph: no adenopathy Neck: no JVD Endocrine:  No thryomegaly Vascular: No carotid bruits; FA pulses 2+ bilaterally without bruits  Cardiac:  normal S1, S2; RRR; no murmur  Lungs:  clear to auscultation bilaterally, crackles in bases Abd: soft, nontender, no hepatomegaly  Ext: 1+ pitting edema bilaterally to knees Musculoskeletal:  No deformities, BUE and BLE strength normal and equal Skin: warm and dry  Neuro:  CNs 2-12 intact, no focal abnormalities noted Psych:  Normal affect   EKG:  The EKG was personally reviewed and demonstrates:  SR with RBBB and prolonged QT interval Telemetry:  Telemetry was personally reviewed and demonstrates:  N/a  Relevant CV Studies:  TTE: 4/17  Study Conclusions  - Left ventricle: The cavity size was normal. Wall thickness was   normal. Systolic function was moderately to severely reduced. The   estimated ejection fraction was in the range of 30% to 35%. There   is severe hypokinesis of the anteroseptal and apical myocardium.   Doppler parameters are consistent with abnormal left ventricular   relaxation  (grade 1 diastolic dysfunction). - Aortic valve: There was mild regurgitation. - Aortic root: The aortic root was mildly dilated. - Mitral valve: There was mild regurgitation.  Impressions:  - Severe hypokinesis of the anteroseptal wall and apex; overall   moderate to severe LV dysfunction; grade 1 diastolic dysfunction;   mild AI and MR; trace TR.  Cath: 6/17  1. Ost RCA to Prox RCA lesion, 50% stenosed. 2. Prox LAD to Dist LAD lesion, 45% stenosed. The lesion was previously treated with a stent (unknown type). 3. Prox Cx to Mid Cx lesion, 50% stenosed.    Mild-to-moderate LAD in-stent restenosis without any evidence of high-grade obstruction.  Moderate proximal RCA stenosis greater than 50%.  There is mild to moderate mid circumflex stenosis.  Low normal LV systolic function with an estimated EF 45-50%. Mildly elevated left ventricular end-diastolic pressures.  Abnormal noninvasive studies, high risk myocardial perfusion imaging without anatomy to explain any evidence of ischemia. Decreased EF and scarring from prior infarction is the likely explanation for the interpretation.   RECOMMENDATION:   Continue heart failure therapy and risk factor modification.  Baseline noninvasive imaging is now understood and can be used as a basis for future comparison.  Laboratory Data:  Chemistry Recent Labs  Lab 11/11/18 1758 11/12/18 0429  NA 141 140  K 2.8* 3.2*  CL 103 107  CO2 24 21*  GLUCOSE 124* 125*  BUN 25* 29*  CREATININE 2.42* 2.90*  CALCIUM 8.5* 7.9*  GFRNONAA 24* 19*  GFRAA 28* 22*  ANIONGAP 14 12    Recent Labs  Lab 11/11/18 1758  PROT 6.1*  ALBUMIN 3.1*  AST 21  ALT 19  ALKPHOS 39  BILITOT 0.7   Hematology Recent  Labs  Lab 11/11/18 1758  WBC 9.5  RBC 3.40*  HGB 10.5*  HCT 33.1*  MCV 97.4  MCH 30.9  MCHC 31.7  RDW 12.9  PLT 201   Cardiac Enzymes Recent Labs  Lab December 12, 2018 0429  TROPONINI 0.06*    Recent Labs  Lab 11/11/18 1838  11/11/18 2332  TROPIPOC 0.07 0.08    BNP Recent Labs  Lab 11/11/18 1758  BNP 1,498.0*    DDimer No results for input(s): DDIMER in the last 168 hours.  Radiology/Studies:  US Renal  Result Date: 2018-12-12 CLINICAL DATA:  Acute kidney injury EXAM: RENAL / URINARY TRACT ULTRASOUND COMPLETE COMPARISON:  None. FINDINGS: Right Kidney: Renal measurements: 13 x 7 x 7 cm . Mild hydronephrosis. Discrete 11 mm echogenic area favoring stone. Left Kidney: Renal measurements: 12 x 7 x 5 cm. Mild hydronephrosis. A measured echogenic area is not convincing for calculus. Bladder: Distended urinary bladder IMPRESSION: 1. Full urinary bladder with mild bilateral hydronephrosis. 2. Non-obstructing right renal calculus. Electronically Signed   By: Monte Fantasia M.D.   On: 12-12-18 10:14   Dg Chest Portable 1 View  Result Date: 11/11/2018 CLINICAL DATA:  Shortness of breath, bilateral lower extremity swelling EXAM: PORTABLE CHEST 1 VIEW COMPARISON:  03/07/2016 FINDINGS: Pulmonary vascular congestion with bilateral perihilar opacities, left greater than right, suspicious for mild interstitial edema. Technically, left upper lobe/perihilar infection is possible. Suspected small bilateral pleural effusions.  No pneumothorax. The heart is normal in size.  Thoracic aortic atherosclerosis. IMPRESSION: Suspected mild interstitial edema. Left upper lobe/perihilar infection is possible. Small bilateral pleural effusions. Electronically Signed   By: Julian Hy M.D.   On: 11/11/2018 18:49    Assessment and Plan:   Mervil Wacker is a 85 y.o. male with a hx of HTN, HL, CAD, chronic systolic HF (01-60%) who is being seen today for the evaluation of HF at the request of Dr Marthenia Rolling.  1. Acute on chronic Systolic HF: Known hx of same with EF being 30-35% back in 2017. Has not been on lasix prior to admission but was using his wife's lasix. Has been given 20mg  IV x1, and now started on 100mg  IV BID. No UOP documented  yet. Cr 2.4>>2.9 this admission. US renal with no acute findings.  -- will reduce lasix back to 40mg  IV BID -- daily weights -- strict I&Os -- check echo  2. CAD: last cath in 2017 with stable findings. Continue ASA  3. AKI?: Cr 2.4>>2.9 on admission. Cr was normal back in august. Renal US was negative with non obstructive stone. Would be cautious with diuresis. May need to involve nephrology.   4. Elevated Trop: Low flat trend, in the setting of AKI. EKG without ischemia  5. Hypokalemia: 2.9>>3.2. Replete with diuresis. Per primary  6. Prolonged QT interval: Noted on EKG on admission. Suspect related to hypokalemia.  -- recheck EKG  For questions or updates, please contact [place] Please consult [email or web address] for contact info under     Signed, Reino Bellis, NP  2018-12-12 12:11 PM

## 2018-11-12 NOTE — Consult Note (Signed)
Urology Consult   Physician requesting consult: Dana Allan, MD  Reason for consult: Urinary retention and difficulty Foley catheter  History of Present Illness: Ronald Kaiser is a 85 y.o. male with a history of Gleason 8 prostate cancer initially treated with XRT and ADT in 2003.  He had a subsequent PSA recurrence with his last PSA being 21.1 in September 2019.  Surveillance CT from 12/2017 showed no evidence of metastatic disease, but he did have a bone scan that showed possible bony involvement of his right sixth rib.  Currently, he is not receiving androgen deprivation therapy.  Urology was consult to evaluate the patient for urinary retention resulting in AKI, bilateral hydronephrosis and CHF exacerbation as well as a difficult Foley catheter placement.  The patient states that he's had a progressively weakening force of stream and a sensation of incomplete bladder emptying over the past 2 weeks.  He is not currently on any BPH medications.  The nursing staff attempted to place a coud catheter unknown [place] and met resistance in the prostatic urethra.   Past Medical History:  Diagnosis Date  . CAD (coronary artery disease)   . HTN (hypertension)   . Hyperlipidemia   . Myocardial infarction ([place]) 2000  . Obesity   . Prostate cancer [facility])     Past Surgical History:  Procedure Laterality Date  . CARDIAC CATHETERIZATION N/A 03/09/2016   Procedure: Left Heart Cath and Coronary Angiography;  Surgeon: Belva Crome, MD;  Location: [facility];  Service: Cardiovascular;  Laterality: N/A;  . Stenting of LAD      Current Hospital Medications:  Home Meds:  Current Meds  Medication Sig  . aspirin EC 81 MG tablet Take 81 mg by mouth 2 (two) times daily after a meal.  . Coenzyme Q10 (CO Q 10) 100 MG CAPS Take 100 mg by mouth daily after supper.   Marland Kitchen lisinopril (PRINIVIL,ZESTRIL) 20 MG tablet Take 1 tablet (20 mg total) by mouth daily. (Patient taking differently: Take 20 mg by  mouth daily after breakfast. )  . metoprolol tartrate (LOPRESSOR) 50 MG tablet Take 1 tablet (50 mg total) by mouth 2 (two) times daily. (Patient taking differently: Take 50 mg by mouth 2 (two) times daily after a meal. )  . Multiple Vitamin (MULTIVITAMIN WITH MINERALS) TABS tablet Take 1 tablet by mouth daily after breakfast. Centrum Silver  . nitroGLYCERIN (NITROSTAT) 0.4 MG SL tablet Place 1 tablet (0.4 mg total) under the tongue every 5 (five) minutes as needed. (Patient taking differently: Place 0.4 mg under the tongue every 5 (five) minutes as needed for chest pain. )  . Omega-3 Fatty Acids (FISH OIL) 1000 MG CAPS Take 1,000 mg by mouth daily after breakfast.   . pravastatin (PRAVACHOL) 20 MG tablet Take 1 tablet (20 mg total) by mouth daily. (Patient taking differently: Take 20 mg by mouth daily after supper. )    Scheduled Meds: . aspirin EC  81 mg Oral BID PC  . furosemide  40 mg Intravenous BID  . heparin  5,000 Units Subcutaneous Q8H  . metoprolol tartrate  25 mg Oral BID  . multivitamin with minerals  1 tablet Oral QPC breakfast  . pravastatin  20 mg Oral QPC supper  . sodium chloride flush  3 mL Intravenous Q12H   Continuous Infusions: . sodium chloride     PRN Meds:.sodium chloride, acetaminophen, ondansetron (ZOFRAN) IV, sodium chloride flush  Allergies: No Known Allergies  Family History  Problem Relation Age of  Onset  . Hypertension Father     Social History:  reports that he quit smoking about 56 years ago. He has never used smokeless tobacco. He reports previous alcohol use. He reports previous drug use.  ROS: A complete review of systems was performed.  All systems are negative except for pertinent findings as noted.  Physical Exam:  Vital signs in last 24 hours: Temp:  [98.3 F (36.8 C)-98.8 F (37.1 C)] 98.8 F (37.1 C) (02/10 0126) Pulse Rate:  [47-77] 63 (02/10 1530) Resp:  [15-28] 24 (02/10 1530) BP: (101-147)/(49-126) 146/70 (02/10 1530) SpO2:  [89  %-96 %] 94 % (02/10 1530) Weight:  [95.7 kg] 95.7 kg (02/09 1747) Constitutional:  Alert and oriented, No acute distress Cardiovascular: Regular rate and rhythm, No JVD Respiratory: Normal respiratory effort, Lungs clear bilaterally GI: Abdomen is soft, nontender, nondistended, no abdominal masses GU: No CVA tenderness, , the patient's bladder is palpable above the pubic symphysis.  There is blood at the urethral meatus, likely from prior traumatic catheter placement.  Both testicles are descended and demonstrated no masses or nodules. Lymphatic: No lymphadenopathy Neurologic: Grossly intact, no focal deficits Psychiatric: Normal mood and affect  Laboratory Data:  Recent Labs    11/11/18 1758  WBC 9.5  HGB 10.5*  HCT 33.1*  PLT 201    Recent Labs    11/11/18 1758 11/12/18 0429  NA 141 140  K 2.8* 3.2*  CL 103 107  GLUCOSE 124* 125*  BUN 25* 29*  CALCIUM 8.5* 7.9*  CREATININE 2.42* 2.90*     Results for orders placed or performed during the hospital encounter of 11/11/18 (from the past 24 hour(s))  CBC with Differential     Status: Abnormal   Collection Time: 11/11/18  5:58 PM  Result Value Ref Range   WBC 9.5 4.0 - 10.5 K/uL   RBC 3.40 (L) 4.22 - 5.81 MIL/uL   Hemoglobin 10.5 (L) 13.0 - 17.0 g/dL   HCT 33.1 (L) 39.0 - 52.0 %   MCV 97.4 80.0 - 100.0 fL   MCH 30.9 26.0 - 34.0 pg   MCHC 31.7 30.0 - 36.0 g/dL   RDW 12.9 11.5 - 15.5 %   Platelets 201 150 - 400 K/uL   nRBC 0.0 0.0 - 0.2 %   Neutrophils Relative % 79 %   Neutro Abs 7.5 1.7 - 7.7 K/uL   Lymphocytes Relative 9 %   Lymphs Abs 0.8 0.7 - 4.0 K/uL   Monocytes Relative 12 %   Monocytes Absolute 1.1 (H) 0.1 - 1.0 K/uL   Eosinophils Relative 0 %   Eosinophils Absolute 0.0 0.0 - 0.5 K/uL   Basophils Relative 0 %   Basophils Absolute 0.0 0.0 - 0.1 K/uL   Immature Granulocytes 0 %   Abs Immature Granulocytes 0.03 0.00 - 0.07 K/uL  Comprehensive metabolic panel     Status: Abnormal   Collection Time:  11/11/18  5:58 PM  Result Value Ref Range   Sodium 141 135 - 145 mmol/L   Potassium 2.8 (L) 3.5 - 5.1 mmol/L   Chloride 103 98 - 111 mmol/L   CO2 24 22 - 32 mmol/L   Glucose, Bld 124 (H) 70 - 99 mg/dL   BUN 25 (H) 8 - 23 mg/dL   Creatinine, Ser 2.42 (H) 0.61 - 1.24 mg/dL   Calcium 8.5 (L) 8.9 - 10.3 mg/dL   Total Protein 6.1 (L) 6.5 - 8.1 g/dL   Albumin 3.1 (L) 3.5 - 5.0 g/dL  AST 21 15 - 41 U/L   ALT 19 0 - 44 U/L   Alkaline Phosphatase 39 38 - 126 U/L   Total Bilirubin 0.7 0.3 - 1.2 mg/dL   GFR calc non Af Amer 24 (L) >60 mL/min   GFR calc Af Amer 28 (L) >60 mL/min   Anion gap 14 5 - 15  Lipase, blood     Status: None   Collection Time: 11/11/18  5:58 PM  Result Value Ref Range   Lipase 23 11 - 51 U/L  Brain natriuretic peptide     Status: Abnormal   Collection Time: 11/11/18  5:58 PM  Result Value Ref Range   B Natriuretic Peptide 1,498.0 (H) 0.0 - 100.0 pg/mL  I-Stat Troponin, ED (not at [facility])     Status: None   Collection Time: 11/11/18  6:38 PM  Result Value Ref Range   Troponin i, poc 0.07 0.00 - 0.08 ng/mL   Comment 3          Urinalysis, Routine w reflex microscopic     Status: None   Collection Time: 11/11/18  6:58 PM  Result Value Ref Range   Color, Urine YELLOW YELLOW   APPearance CLEAR CLEAR   Specific Gravity, Urine 1.006 1.005 - 1.030   pH 5.0 5.0 - 8.0   Glucose, UA NEGATIVE NEGATIVE mg/dL   Hgb urine dipstick NEGATIVE NEGATIVE   Bilirubin Urine NEGATIVE NEGATIVE   Ketones, ur NEGATIVE NEGATIVE mg/dL   Protein, ur NEGATIVE NEGATIVE mg/dL   Nitrite NEGATIVE NEGATIVE   Leukocytes, UA NEGATIVE NEGATIVE  I-Stat Troponin, ED (not at [facility])     Status: None   Collection Time: 11/11/18 11:32 PM  Result Value Ref Range   Troponin i, poc 0.08 0.00 - 0.08 ng/mL   Comment 3          Basic metabolic panel     Status: Abnormal   Collection Time: 11/12/18  4:29 AM  Result Value Ref Range   Sodium 140 135 - 145 mmol/L   Potassium 3.2 (L) 3.5 - 5.1 mmol/L    Chloride 107 98 - 111 mmol/L   CO2 21 (L) 22 - 32 mmol/L   Glucose, Bld 125 (H) 70 - 99 mg/dL   BUN 29 (H) 8 - 23 mg/dL   Creatinine, Ser 2.90 (H) 0.61 - 1.24 mg/dL   Calcium 7.9 (L) 8.9 - 10.3 mg/dL   GFR calc non Af Amer 19 (L) >60 mL/min   GFR calc Af Amer 22 (L) >60 mL/min   Anion gap 12 5 - 15  Troponin I - Now Then Q6H     Status: Abnormal   Collection Time: 11/12/18  4:29 AM  Result Value Ref Range   Troponin I 0.06 (HH) <0.03 ng/mL  Troponin I - Now Then Q6H     Status: Abnormal   Collection Time: 11/12/18 11:46 AM  Result Value Ref Range   Troponin I 0.05 (HH) <0.03 ng/mL   No results found for this or any previous visit (from the past 240 hour(s)).  Renal Function: Recent Labs    11/11/18 1758 11/12/18 0429  CREATININE 2.42* 2.90*   Estimated Creatinine Clearance: 22.2 mL/min (A) (by C-G formula based on SCr of 2.9 mg/dL (H)).  Radiologic Imaging: US Renal  Result Date: 11/12/2018 CLINICAL DATA:  Acute kidney injury EXAM: RENAL / URINARY TRACT ULTRASOUND COMPLETE COMPARISON:  None. FINDINGS: Right Kidney: Renal measurements: 13 x 7 x 7 cm . Mild hydronephrosis. Discrete 11  mm echogenic area favoring stone. Left Kidney: Renal measurements: 12 x 7 x 5 cm. Mild hydronephrosis. A measured echogenic area is not convincing for calculus. Bladder: Distended urinary bladder IMPRESSION: 1. Full urinary bladder with mild bilateral hydronephrosis. 2. Non-obstructing right renal calculus. Electronically Signed   By: Monte Fantasia M.D.   On: 11/12/2018 10:14   Dg Chest Portable 1 View  Result Date: 11/11/2018 CLINICAL DATA:  Shortness of breath, bilateral lower extremity swelling EXAM: PORTABLE CHEST 1 VIEW COMPARISON:  03/07/2016 FINDINGS: Pulmonary vascular congestion with bilateral perihilar opacities, left greater than right, suspicious for mild interstitial edema. Technically, left upper lobe/perihilar infection is possible. Suspected small bilateral pleural effusions.  No  pneumothorax. The heart is normal in size.  Thoracic aortic atherosclerosis. IMPRESSION: Suspected mild interstitial edema. Left upper lobe/perihilar infection is possible. Small bilateral pleural effusions. Electronically Signed   By: Julian Hy M.D.   On: 11/11/2018 18:49    I independently reviewed the above imaging studies.  Impression/Recommendation 85 year old male with a history of Gleason 8 prostate cancer status post XRT w/ ADT in 2003, acute urinary retention, bilateral hydronephrosis likely secondary to bladder outlet obstruction, acute kidney injury and CHF  -I was able to easily pass a 14 [place] Foley catheter with no resistance and return of approximately 900 mL of clear-yellow urine.  I recommend that this And placed for approximate 10 days and that the patient start once daily tamsulosin.  Continue to trend renal function following bladder decompression.  Urine culture been ordered.  OP f/u with Dr. Louis Meckel is in his discharge paperwork.   Ellison Hughs, MD [facility] 11/12/2018, 4:29 PM

## 2018-11-12 NOTE — Progress Notes (Signed)
PROGRESS NOTE    Haynes Giannotti  OAC:[id number] DOB: [birth year] DOA: 11/11/2018 PCP: Christain Sacramento, MD  Outpatient Specialists:   Brief Narrative:  Wylan Gentzler is an 85 year old male with past medical history significant for hypertension, MI, CAD, EF 30-35% in 2017.  Patient has had gradual worsening DOE and increased BLE edema over past weeks.    Patient's wife, who has CHF, recognized symptoms and attempted to treat patient at home.  Patient was given 20mg  PO lasix on Thurs and Fri last week, 40mg  PO lasix on Sat, 20mg  PO lasix on Sunday.  Good UOP and improvement in BLE edema, but DOE and SOB persisted.  She was brought to the ER for further assessment and management.  On presentation to the ER, chest x-ray done revealed pulmonary edema, cardiac BNP was 1500, serum creatinine was 2.4 (up from 0.9 in August 2019), and potassium was 2.8.  Patient has been diuresed in the ER.  Worsening renal function is noted.  Serum creatinine today is 2.9.  Revealed Distended and Full Urinary Bladder with Mild Bilateral Hydronephrosis.  Non-Obstructing Right Renal Calculus Was Reported.  Urology team has been consulted.  Orders been given to place Foley catheter.  Further management will depend on hospital course.  Assessment & Plan:   Principal Problem:   Acute on chronic systolic CHF (congestive heart failure) (HCC) Active Problems:   Hyperlipidemia   CAD (coronary artery disease)   Hypertension   AKI (acute kidney injury) ([place])  Acute on chronic systolic CHF: CHF pathway Follow echocardiogram. Last echo done in 2017 revealed EF of 30 to 35% IV Lasix. Monitor renal function and electrolytes Strict I&O With patient daily Further management depend on hospital course  AKI: Likely secondary to obstructive uropathy. Continue to monitor renal function and electrolytes.   Hypertension: Continue to hold lisinopril. Optimize blood pressure control.  Hypokalemia: Continue to monitor and  replete.  Hyperlipidemia: Continue statin.  DVT prophylaxis: Heparin [place] Code Status: Full Code Family Communication:  Disposition Plan: Home after admit Consults called: Cards to see in AM (as by H&P).  Urology team has been consulted.   Procedures:   None  Antimicrobials:   None   Subjective: Shortness of breath and dyspnea on exertion  Objective: Vitals:   11/12/18 0200 11/12/18 0626 11/12/18 0627 11/12/18 0654  BP: 131/62 132/69 132/69 (!) 113/49  Pulse: (!) 55 (!) 52 (!) 56 65  Resp: 19 18 16 18   Temp:      TempSrc:      SpO2: 94% 96% 96% 96%  Weight:      Height:        Intake/Output Summary (Last 24 hours) at 11/12/2018 0857 Last data filed at 11/12/2018 0223 Gross per 24 hour  Intake 200 ml  Output -  Net 200 ml   Filed Weights   11/11/18 1747  Weight: 95.7 kg    Examination:  General exam: Appears calm and comfortable  Respiratory system: Clear to auscultation.  Cardiovascular system: S1 & S2 heard.mild leg edema.   Gastrointestinal system: Abdomen is nondistended, soft and nontender. No organomegaly or masses felt. Normal bowel sounds heard. Central nervous system: Alert and oriented. No focal neurological deficits. Extremities: Mild leg edema.  Data Reviewed: I have personally reviewed following labs and imaging studies  CBC: Recent Labs  Lab 11/11/18 1758  WBC 9.5  NEUTROABS 7.5  HGB 10.5*  HCT 33.1*  MCV 97.4  PLT 010   Basic Metabolic Panel: Recent Labs  Lab 11/11/18  1758 11/12/18 0429  NA 141 140  K 2.8* 3.2*  CL 103 107  CO2 24 21*  GLUCOSE 124* 125*  BUN 25* 29*  CREATININE 2.42* 2.90*  CALCIUM 8.5* 7.9*   GFR: Estimated Creatinine Clearance: 22.2 mL/min (A) (by C-G formula based on SCr of 2.9 mg/dL (H)). Liver Function Tests: Recent Labs  Lab 11/11/18 1758  AST 21  ALT 19  ALKPHOS 39  BILITOT 0.7  PROT 6.1*  ALBUMIN 3.1*   Recent Labs  Lab 11/11/18 1758  LIPASE 23   No results for input(s): AMMONIA  in the last 168 hours. Coagulation Profile: No results for input(s): INR, PROTIME in the last 168 hours. Cardiac Enzymes: Recent Labs  Lab 11/12/18 0429  TROPONINI 0.06*   BNP (last 3 results) No results for input(s): PROBNP in the last 8760 hours. HbA1C: No results for input(s): HGBA1C in the last 72 hours. CBG: No results for input(s): GLUCAP in the last 168 hours. Lipid Profile: No results for input(s): CHOL, HDL, LDLCALC, TRIG, CHOLHDL, LDLDIRECT in the last 72 hours. Thyroid Function Tests: No results for input(s): TSH, T4TOTAL, FREET4, T3FREE, THYROIDAB in the last 72 hours. Anemia Panel: No results for input(s): VITAMINB12, FOLATE, FERRITIN, TIBC, IRON, RETICCTPCT in the last 72 hours. Urine analysis:    Component Value Date/Time   COLORURINE YELLOW 11/11/2018 1858   APPEARANCEUR CLEAR 11/11/2018 1858   LABSPEC 1.006 11/11/2018 1858   PHURINE 5.0 11/11/2018 1858   GLUCOSEU NEGATIVE 11/11/2018 1858   HGBUR NEGATIVE 11/11/2018 1858   BILIRUBINUR NEGATIVE 11/11/2018 1858   KETONESUR NEGATIVE 11/11/2018 1858   PROTEINUR NEGATIVE 11/11/2018 1858   NITRITE NEGATIVE 11/11/2018 1858   LEUKOCYTESUR NEGATIVE 11/11/2018 1858   Sepsis Labs: @LABRCNTIP (procalcitonin:4,lacticidven:4)  )No results found for this or any previous visit (from the past 240 hour(s)).       Radiology Studies: Dg Chest Portable 1 View  Result Date: 11/11/2018 CLINICAL DATA:  Shortness of breath, bilateral lower extremity swelling EXAM: PORTABLE CHEST 1 VIEW COMPARISON:  03/07/2016 FINDINGS: Pulmonary vascular congestion with bilateral perihilar opacities, left greater than right, suspicious for mild interstitial edema. Technically, left upper lobe/perihilar infection is possible. Suspected small bilateral pleural effusions.  No pneumothorax. The heart is normal in size.  Thoracic aortic atherosclerosis. IMPRESSION: Suspected mild interstitial edema. Left upper lobe/perihilar infection is possible.  Small bilateral pleural effusions. Electronically Signed   By: Julian Hy M.D.   On: 11/11/2018 18:49        Scheduled Meds: . aspirin EC  81 mg Oral BID PC  . heparin  5,000 Units Subcutaneous Q8H  . metoprolol tartrate  25 mg Oral BID  . multivitamin with minerals  1 tablet Oral QPC breakfast  . pravastatin  20 mg Oral QPC supper  . sodium chloride flush  3 mL Intravenous Q12H   Continuous Infusions: . sodium chloride    . furosemide       LOS: 1 day    Time spent: 35 Minutes.    Dana Allan, MD  [facility] Pager #: [phone] 7PM-7AM contact night coverage as above

## 2018-11-12 NOTE — ED Notes (Signed)
Patient transported to Ultrasound 

## 2018-11-12 NOTE — ED Notes (Signed)
Breakfast Tray ordered  

## 2018-11-12 NOTE — ED Notes (Signed)
Reported inability to insert a coude catheter to the MD via text page.

## 2018-11-12 NOTE — ED Notes (Signed)
Attempted to insert coude ' cath unable to get cath to pass. RN aware.

## 2018-11-13 ENCOUNTER — Encounter (HOSPITAL_COMMUNITY): Payer: Self-pay | Admitting: General Practice

## 2018-11-13 ENCOUNTER — Inpatient Hospital Stay (HOSPITAL_COMMUNITY): Payer: Medicare Other

## 2018-11-13 DIAGNOSIS — I480 Paroxysmal atrial fibrillation: Secondary | ICD-10-CM

## 2018-11-13 DIAGNOSIS — I5023 Acute on chronic systolic (congestive) heart failure: Secondary | ICD-10-CM

## 2018-11-13 LAB — ECHOCARDIOGRAM COMPLETE
Height: 73 in
Weight: 3107.2 oz

## 2018-11-13 LAB — URINE CULTURE: Culture: NO GROWTH

## 2018-11-13 LAB — RENAL FUNCTION PANEL
Albumin: 2.8 g/dL — ABNORMAL LOW (ref 3.5–5.0)
Albumin: 2.5 g/dL — ABNORMAL LOW (ref 3.5–5.0)
Anion gap: 14 (ref 5–15)
Anion gap: 11 (ref 5–15)
BUN: 28 mg/dL — ABNORMAL HIGH (ref 8–23)
BUN: 37 mg/dL — ABNORMAL HIGH (ref 8–23)
CO2: 29 mmol/L (ref 22–32)
CO2: 31 mmol/L (ref 22–32)
Calcium: 9 mg/dL (ref 8.9–10.3)
Calcium: 8.6 mg/dL — ABNORMAL LOW (ref 8.9–10.3)
Chloride: 100 mmol/L (ref 98–111)
Chloride: 100 mmol/L (ref 98–111)
Creatinine, Ser: 1.91 mg/dL — ABNORMAL HIGH (ref 0.61–1.24)
Creatinine, Ser: 1.87 mg/dL — ABNORMAL HIGH (ref 0.61–1.24)
GFR calc Af Amer: 37 mL/min — ABNORMAL LOW (ref >60)
GFR calc Af Amer: 38 mL/min — ABNORMAL LOW (ref >60)
GFR calc non Af Amer: 32 mL/min — ABNORMAL LOW (ref >60)
GFR calc non Af Amer: 33 mL/min — ABNORMAL LOW (ref >60)
Glucose, Bld: 146 mg/dL — ABNORMAL HIGH (ref 70–99)
Glucose, Bld: 119 mg/dL — ABNORMAL HIGH (ref 70–99)
Phosphorus: 4.4 mg/dL (ref 2.5–4.6)
Phosphorus: 4 mg/dL (ref 2.5–4.6)
Potassium: 2.8 mmol/L — ABNORMAL LOW (ref 3.5–5.1)
Potassium: 3.3 mmol/L — ABNORMAL LOW (ref 3.5–5.1)
Sodium: 143 mmol/L (ref 135–145)
Sodium: 142 mmol/L (ref 135–145)

## 2018-11-13 LAB — ANTINUCLEAR ANTIBODIES, IFA: ANA Ab, IFA: NEGATIVE

## 2018-11-13 MED ORDER — SODIUM CHLORIDE 0.9 % IV BOLUS
500.0000 mL | Freq: Once | INTRAVENOUS | Status: AC
  Administered 2018-11-13: 500 mL via INTRAVENOUS

## 2018-11-13 MED ORDER — POTASSIUM CHLORIDE CRYS ER 20 MEQ PO TBCR
40.0000 meq | EXTENDED_RELEASE_TABLET | Freq: Two times a day (BID) | ORAL | Status: AC
  Administered 2018-11-13 (×2): 40 meq via ORAL
  Filled 2018-11-13 (×2): qty 2

## 2018-11-13 NOTE — Progress Notes (Signed)
PT Cancellation Note  Patient Details Name: Ronald Kaiser MRN: [id number] DOB: 1933/12/18   Cancelled Treatment:    Reason Eval/Treat Not Completed: Medical issues which prohibited therapy. Spoke with RN requesting to hold therapy at this time due to elevated HR and hypotension. Will follow up as patient becomes medically appropriate and as schedule allows.   Erick Blinks, SPT  Erick Blinks 11/13/2018, 12:37 PM

## 2018-11-13 NOTE — Progress Notes (Signed)
Patient has low BP and tachycardic HR 110-120s symptomatic. MD paged made aware.

## 2018-11-13 NOTE — Progress Notes (Signed)
PROGRESS NOTE    Ronald Kaiser  WUJ:[id number] DOB: [birth year] DOA: 11/11/2018 PCP: Christain Sacramento, MD  Outpatient Specialists:   Brief Narrative:  Ronald Kaiser is an 85 year old male with past medical history significant for hypertension, MI, CAD, EF 30-35% in 2017.  Patient has had gradual worsening DOE and increased BLE edema over past weeks.    Patient's wife, who has CHF, recognized symptoms and attempted to treat patient at home.  Patient was given 20mg  PO lasix on Thurs and Fri last week, 40mg  PO lasix on Sat, 20mg  PO lasix on Sunday.  Good UOP and improvement in BLE edema, but DOE and SOB persisted.  She was brought to the ER for further assessment and management.  On presentation to the ER, chest x-ray done revealed pulmonary edema, cardiac BNP was 1500, serum creatinine was 2.4 (up from 0.9 in August 2019), and potassium was 2.8.  Patient has been diuresed in the ER.  Worsening renal function is noted.  Serum creatinine today is 2.9.  Revealed Distended and Full Urinary Bladder with Mild Bilateral Hydronephrosis.  Non-obstructing Right Renal Calculus Was Reported.  Urology team has been consulted.  Foley catheter is been placed.    11/13/2018: Patient seen.  Patient feels better today.  Shortness of breath has improved.  Patient is 9.7 L negative.  Will hold IV Lasix for now, as patient became a bit hypotensive, with systolic blood pressure in the 80s.  With 500 cc normal saline bolus, the patient's blood pressure has normalized.  Serum creatinine has improved to 1.9.  Continue to monitor I's and O's, renal function and electrolytes.  Potassium of 2.8 is noted.  Patient has received potassium supplements.  Will repeat renal panel.  Follow echocardiogram report.  Further management will depend on hospital course.  Assessment & Plan:   Principal Problem:   Acute on chronic systolic CHF (congestive heart failure) (HCC) Active Problems:   Hyperlipidemia   CAD (coronary artery disease)  Hypertension   AKI (acute kidney injury) ([place])  Acute on chronic systolic CHF: Follow echocardiogram. Last echo done in 2017 revealed EF of 30 to 35% IV Lasix currently on hold. Patient is 9.7 L negative. Patient was mildly hypotensive earlier today, with systolic blood pressure in the 80s. Continue to monitor I's and O's, renal function and electrolytes. Potassium of 2.8 was noted, and has been repleted. Follow repeat renal panel. Monitor magnesium as well. Further management will depend on hospital course.  AKI: Likely secondary to obstructive uropathy. Continue to monitor renal function and electrolytes.  Serum creatinine improved to 1.9 today. Patient has Foley catheter in place. Awaiting urology input. Patient will need to follow-up with urology on discharge. Patient may need to be discharged with Foley catheter in situ.  Hypertension: Hypotension was noted earlier today following aggressive diuresis.   Hypotension has resolved with volume resuscitation (500 cc of normal saline).   Continue to hold lisinopril. Optimize blood pressure control.  Hypokalemia: Continue to monitor and replete. Potassium was 2.8 earlier today.  Hyperlipidemia:  Continue statin.  DVT prophylaxis: Heparin [place] Code Status: Full Code Family Communication:  Disposition Plan: Home after admit Consults called: Cards to see in AM (as by H&P).  Urology team has been consulted.   Procedures:   None  Antimicrobials:   None   Subjective: Shortness of breath and dyspnea on exertion  Objective: Vitals:   11/13/18 0937 11/13/18 1020 11/13/18 1241 11/13/18 1600  BP: (!) 80/52 (!) 96/52 (!) 126/93   Pulse: Marland Kitchen)  129 (!) 119 (!) 126 66  Resp:      Temp:      TempSrc:      SpO2:      Weight:      Height:        Intake/Output Summary (Last 24 hours) at 11/13/2018 1635 Last data filed at 11/13/2018 1200 Gross per 24 hour  Intake 1123 ml  Output 10325 ml  Net -9202 ml   Filed Weights     11/11/18 1747 11/12/18 1711 11/13/18 0512  Weight: 95.7 kg 93 kg 88.1 kg    Examination:  General exam: Appears calm and comfortable  Respiratory system: Clear to auscultation.  Cardiovascular system: S1 & S2 heard.mild leg edema.   Gastrointestinal system: Abdomen is nondistended, soft and nontender. No organomegaly or masses felt. Normal bowel sounds heard. Central nervous system: Alert and oriented. No focal neurological deficits. Extremities: Leg edema has improved significantly.  Data Reviewed: I have personally reviewed following labs and imaging studies  CBC: Recent Labs  Lab 11/11/18 1758  WBC 9.5  NEUTROABS 7.5  HGB 10.5*  HCT 33.1*  MCV 97.4  PLT 469   Basic Metabolic Panel: Recent Labs  Lab 11/11/18 1758 11/12/18 0429 11/13/18 0445  NA 141 140 143  K 2.8* 3.2* 2.8*  CL 103 107 100  CO2 24 21* 29  GLUCOSE 124* 125* 146*  BUN 25* 29* 28*  CREATININE 2.42* 2.90* 1.91*  CALCIUM 8.5* 7.9* 9.0  PHOS  --   --  4.4   GFR: Estimated Creatinine Clearance: 33.7 mL/min (A) (by C-G formula based on SCr of 1.91 mg/dL (H)). Liver Function Tests: Recent Labs  Lab 11/11/18 1758 11/13/18 0445  AST 21  --   ALT 19  --   ALKPHOS 39  --   BILITOT 0.7  --   PROT 6.1*  --   ALBUMIN 3.1* 2.8*   Recent Labs  Lab 11/11/18 1758  LIPASE 23   No results for input(s): AMMONIA in the last 168 hours. Coagulation Profile: No results for input(s): INR, PROTIME in the last 168 hours. Cardiac Enzymes: Recent Labs  Lab 11/12/18 0429 11/12/18 1146 11/12/18 1803  TROPONINI 0.06* 0.05* 0.06*   BNP (last 3 results) No results for input(s): PROBNP in the last 8760 hours. HbA1C: No results for input(s): HGBA1C in the last 72 hours. CBG: No results for input(s): GLUCAP in the last 168 hours. Lipid Profile: No results for input(s): CHOL, HDL, LDLCALC, TRIG, CHOLHDL, LDLDIRECT in the last 72 hours. Thyroid Function Tests: No results for input(s): TSH, T4TOTAL, FREET4,  T3FREE, THYROIDAB in the last 72 hours. Anemia Panel: No results for input(s): VITAMINB12, FOLATE, FERRITIN, TIBC, IRON, RETICCTPCT in the last 72 hours. Urine analysis:    Component Value Date/Time   COLORURINE YELLOW 11/11/2018 1858   APPEARANCEUR CLEAR 11/11/2018 1858   LABSPEC 1.006 11/11/2018 1858   PHURINE 5.0 11/11/2018 1858   GLUCOSEU NEGATIVE 11/11/2018 1858   HGBUR NEGATIVE 11/11/2018 1858   BILIRUBINUR NEGATIVE 11/11/2018 1858   KETONESUR NEGATIVE 11/11/2018 1858   PROTEINUR NEGATIVE 11/11/2018 1858   NITRITE NEGATIVE 11/11/2018 1858   LEUKOCYTESUR NEGATIVE 11/11/2018 1858   Sepsis Labs: @LABRCNTIP (procalcitonin:4,lacticidven:4)  ) Recent Results (from the past 240 hour(s))  Culture, Urine     Status: None   Collection Time: 11/12/18  4:42 PM  Result Value Ref Range Status   Specimen Description URINE, CATHETERIZED  Final   Special Requests NONE  Final   Culture   Final  NO GROWTH Performed at [facility], [place] [address], [place], [place] [address]    Report Status 11/13/2018 FINAL  Final         Radiology Studies: Dg Chest 2 View  Result Date: 11/13/2018 CLINICAL DATA:  Reason for exam: chf Patient reports he passed out this morning while trying to get out of bed. He did not fall to ground, only back into his bed. Denies any sob. Denies any chest pains. Denies any cough or congestion. History of prostate cancer and MI. History of smoking. EXAM: CHEST - 2 VIEW COMPARISON:  11/11/2018 FINDINGS: Heart size is normal. There is mild perihilar peribronchial thickening. There has been significant improvement in aeration. No new consolidations. Small residual pleural effusions. IMPRESSION: Significant improvement in aeration. Residual mild interstitial edema and small effusions. Electronically Signed   By: Nolon Nations M.D.   On: 11/13/2018 09:02   US Renal  Result Date: 11/12/2018 CLINICAL DATA:  Acute kidney injury EXAM: RENAL / URINARY TRACT  ULTRASOUND COMPLETE COMPARISON:  None. FINDINGS: Right Kidney: Renal measurements: 13 x 7 x 7 cm . Mild hydronephrosis. Discrete 11 mm echogenic area favoring stone. Left Kidney: Renal measurements: 12 x 7 x 5 cm. Mild hydronephrosis. A measured echogenic area is not convincing for calculus. Bladder: Distended urinary bladder IMPRESSION: 1. Full urinary bladder with mild bilateral hydronephrosis. 2. Non-obstructing right renal calculus. Electronically Signed   By: Monte Fantasia M.D.   On: 11/12/2018 10:14   Dg Chest Portable 1 View  Result Date: 11/11/2018 CLINICAL DATA:  Shortness of breath, bilateral lower extremity swelling EXAM: PORTABLE CHEST 1 VIEW COMPARISON:  03/07/2016 FINDINGS: Pulmonary vascular congestion with bilateral perihilar opacities, left greater than right, suspicious for mild interstitial edema. Technically, left upper lobe/perihilar infection is possible. Suspected small bilateral pleural effusions.  No pneumothorax. The heart is normal in size.  Thoracic aortic atherosclerosis. IMPRESSION: Suspected mild interstitial edema. Left upper lobe/perihilar infection is possible. Small bilateral pleural effusions. Electronically Signed   By: Julian Hy M.D.   On: 11/11/2018 18:49        Scheduled Meds: . aspirin EC  81 mg Oral BID PC  . heparin  5,000 Units Subcutaneous Q8H  . metoprolol tartrate  25 mg Oral BID  . multivitamin with minerals  1 tablet Oral QPC breakfast  . pravastatin  20 mg Oral QPC supper  . sodium chloride flush  3 mL Intravenous Q12H  . tamsulosin  0.4 mg Oral QPC supper   Continuous Infusions: . sodium chloride       LOS: 2 days    Time spent: 35 Minutes.    Dana Allan, MD  [facility] Pager #: [phone] 7PM-7AM contact night coverage as above

## 2018-11-13 NOTE — Progress Notes (Signed)
  Echocardiogram 2D Echocardiogram has been performed.  Ronald Kaiser 11/13/2018, 2:11 PM

## 2018-11-13 NOTE — Care Management Note (Signed)
Case Management Note  Patient Details  Name: Ronald Kaiser MRN: [id number] Date of Birth: Jul 19, 1934  Subjective/Objective:     CHF              Action/Plan: Patient lives at home with spouse; PCP is Dr Kathryne Eriksson; has private insurance with Medicare; pharmacy of choice is [facility]; DME- walker, cane at home; patient stated that he does not have any difficulty getting his medication; patient is refusing all [facility] services at this time; CM will continue to follow for progression of care.  Expected Discharge Date:    possibly 11/16/2018              Expected Discharge Plan:  [facility]  Discharge planning Services  CM Consult  HH Arranged:  Patient Refused [facility]  Status of Service:  In process, will continue to follow  Sherrilyn Rist [phone] 11/13/2018, 12:48 PM

## 2018-11-13 NOTE — Progress Notes (Signed)
Patient was very dizzy upon sitting up.Noted that patient had too much urine output for the last 13 hrs ( see flowsheet ). Foley Catheter clamped at this time. Will monitor.

## 2018-11-13 NOTE — Progress Notes (Signed)
Patient's SBP on 80's, HR 110-120s. MD ordered IVF 500cc bolus. Will monitor.

## 2018-11-13 NOTE — Progress Notes (Signed)
Progress Note  Patient Name: Ronald Kaiser Date of Encounter: 11/13/2018  Primary Cardiologist: Kirk Ruths, MD   Subjective   Has an episode this AM when he stood to get on the scale and he felt like he passed out. Not sure that he actually lost consciousness. Did not fall, as nurse was present. BP was low, HR elevated, thought to be due to brisk urine output after foley placed. Otherwise feeling improved. Lower abdominal pain is better.  Inpatient Medications    Scheduled Meds: . aspirin EC  81 mg Oral BID PC  . heparin  5,000 Units Subcutaneous Q8H  . metoprolol tartrate  25 mg Oral BID  . multivitamin with minerals  1 tablet Oral QPC breakfast  . potassium chloride  40 mEq Oral BID  . pravastatin  20 mg Oral QPC supper  . sodium chloride flush  3 mL Intravenous Q12H  . tamsulosin  0.4 mg Oral QPC supper   Continuous Infusions: . sodium chloride     PRN Meds: sodium chloride, acetaminophen, ondansetron (ZOFRAN) IV, sodium chloride flush   Vital Signs    Vitals:   11/13/18 0933 11/13/18 0937 11/13/18 1020 11/13/18 1241  BP: (!) 87/50 (!) 80/52 (!) 96/52 (!) 126/93  Pulse: (!) 112 (!) 129 (!) 119 (!) 126  Resp:      Temp:      TempSrc:      SpO2:      Weight:      Height:        Intake/Output Summary (Last 24 hours) at 11/13/2018 1247 Last data filed at 11/13/2018 0956 Gross per 24 hour  Intake 723 ml  Output 10325 ml  Net -9602 ml   Last 3 Weights 11/13/2018 11/12/2018 11/11/2018  Weight (lbs) 194 lb 3.2 oz 205 lb 1.6 oz 211 lb  Weight (kg) 88.089 kg 93.033 kg 95.709 kg      Telemetry    Was sinus rhythm, now looks like atrial fibrillation with RVR - Personally Reviewed  ECG    From 2/9, sinus bradycardia with PACs - Personally Reviewed  Physical Exam   GEN: No acute distress.   Neck: JVD not visible sitting upright Cardiac: irregularly irregular, no murmurs, rubs, or gallops.  Respiratory: Clear to auscultation bilaterally. GI: Soft, nontender,  non-distended  MS: No edema; No deformity. Neuro:  Nonfocal  Psych: Normal affect   Labs    Chemistry Recent Labs  Lab 11/11/18 1758 11/12/18 0429 11/13/18 0445  NA 141 140 143  K 2.8* 3.2* 2.8*  CL 103 107 100  CO2 24 21* 29  GLUCOSE 124* 125* 146*  BUN 25* 29* 28*  CREATININE 2.42* 2.90* 1.91*  CALCIUM 8.5* 7.9* 9.0  PROT 6.1*  --   --   ALBUMIN 3.1*  --  2.8*  AST 21  --   --   ALT 19  --   --   ALKPHOS 39  --   --   BILITOT 0.7  --   --   GFRNONAA 24* 19* 32*  GFRAA 28* 22* 37*  ANIONGAP 14 12 14      Hematology Recent Labs  Lab 11/11/18 1758  WBC 9.5  RBC 3.40*  HGB 10.5*  HCT 33.1*  MCV 97.4  MCH 30.9  MCHC 31.7  RDW 12.9  PLT 201    Cardiac Enzymes Recent Labs  Lab 11/12/18 0429 11/12/18 1146 11/12/18 1803  TROPONINI 0.06* 0.05* 0.06*    Recent Labs  Lab 11/11/18 1838 11/11/18 2332  TROPIPOC 0.07 0.08     BNP Recent Labs  Lab 11/11/18 1758  BNP 1,498.0*     DDimer No results for input(s): DDIMER in the last 168 hours.   Radiology    Dg Chest 2 View  Result Date: 11/13/2018 CLINICAL DATA:  Reason for exam: chf Patient reports he passed out this morning while trying to get out of bed. He did not fall to ground, only back into his bed. Denies any sob. Denies any chest pains. Denies any cough or congestion. History of prostate cancer and MI. History of smoking. EXAM: CHEST - 2 VIEW COMPARISON:  11/11/2018 FINDINGS: Heart size is normal. There is mild perihilar peribronchial thickening. There has been significant improvement in aeration. No new consolidations. Small residual pleural effusions. IMPRESSION: Significant improvement in aeration. Residual mild interstitial edema and small effusions. Electronically Signed   By: Nolon Nations M.D.   On: 11/13/2018 09:02   US Renal  Result Date: 11/12/2018 CLINICAL DATA:  Acute kidney injury EXAM: RENAL / URINARY TRACT ULTRASOUND COMPLETE COMPARISON:  None. FINDINGS: Right Kidney: Renal  measurements: 13 x 7 x 7 cm . Mild hydronephrosis. Discrete 11 mm echogenic area favoring stone. Left Kidney: Renal measurements: 12 x 7 x 5 cm. Mild hydronephrosis. A measured echogenic area is not convincing for calculus. Bladder: Distended urinary bladder IMPRESSION: 1. Full urinary bladder with mild bilateral hydronephrosis. 2. Non-obstructing right renal calculus. Electronically Signed   By: Monte Fantasia M.D.   On: 11/12/2018 10:14   Dg Chest Portable 1 View  Result Date: 11/11/2018 CLINICAL DATA:  Shortness of breath, bilateral lower extremity swelling EXAM: PORTABLE CHEST 1 VIEW COMPARISON:  03/07/2016 FINDINGS: Pulmonary vascular congestion with bilateral perihilar opacities, left greater than right, suspicious for mild interstitial edema. Technically, left upper lobe/perihilar infection is possible. Suspected small bilateral pleural effusions.  No pneumothorax. The heart is normal in size.  Thoracic aortic atherosclerosis. IMPRESSION: Suspected mild interstitial edema. Left upper lobe/perihilar infection is possible. Small bilateral pleural effusions. Electronically Signed   By: Julian Hy M.D.   On: 11/11/2018 18:49    Cardiac Studies   Repeat echo pending  Patient Profile     85 y.o. male with PMH HTN, HL, CAD, chronic systolic HF (67-12%) who is being seen today for the evaluation of volume overload at the request of Dr Marthenia Rolling.  Assessment & Plan    Acute on chronic systolic and diastolic heart failure: now euvolemic after brisk diuresis yesterday. Last EF 30-35% -repeat echo pending -would hold on diuretics for now given brisk output, hypokalemia. Suspect he may autodiuresis after catheter placement. -daily weights, strict I/O, sodium restriction  CAD: no chest pain, last cath 2017 -continue aspirin -elevated troponin is flat in setting of AKI, not consistent with ACS  Acute kidney injury: suspect obstructive given brisk urine output and improvement in Cr after foley  placement  Atrial fibrillation with RVR: suspect that hypokalemia is also driving this. Would replete K>4, Mg>2. Control with beta blockers, no diltiazem given low EF. Would be aware that he is in sinus bradycardia typically when he is in sinus rhythm, so would avoid over blockade of AV node. -will avoid anticoagulation for now as this is likely in the setting of hypokalemia, but if it persists will need to discuss anticoagulation.  TIME SPENT WITH PATIENT: 25 minutes of direct patient care. More than 50% of that time was spent on coordination of care and counseling regarding heart failure.  Buford Dresser, MD, PhD [facility]  [facility]   For questions or updates, please contact [facility] Please consult [email or web address] for contact info under     Signed, Buford Dresser, MD  11/13/2018, 12:47 PM

## 2018-11-14 LAB — RENAL FUNCTION PANEL
Albumin: 2.5 g/dL — ABNORMAL LOW (ref 3.5–5.0)
Anion gap: 12 (ref 5–15)
BUN: 31 mg/dL — ABNORMAL HIGH (ref 8–23)
CO2: 29 mmol/L (ref 22–32)
Calcium: 8.8 mg/dL — ABNORMAL LOW (ref 8.9–10.3)
Chloride: 101 mmol/L (ref 98–111)
Creatinine, Ser: 1.4 mg/dL — ABNORMAL HIGH (ref 0.61–1.24)
GFR calc Af Amer: 54 mL/min — ABNORMAL LOW (ref >60)
GFR calc non Af Amer: 46 mL/min — ABNORMAL LOW (ref >60)
Glucose, Bld: 108 mg/dL — ABNORMAL HIGH (ref 70–99)
Phosphorus: 3.2 mg/dL (ref 2.5–4.6)
Potassium: 3.3 mmol/L — ABNORMAL LOW (ref 3.5–5.1)
Sodium: 142 mmol/L (ref 135–145)

## 2018-11-14 LAB — PHOSPHORUS: Phosphorus: 3.2 mg/dL (ref 2.5–4.6)

## 2018-11-14 LAB — MAGNESIUM: Magnesium: 1.9 mg/dL (ref 1.7–2.4)

## 2018-11-14 MED ORDER — FUROSEMIDE 20 MG PO TABS: 20.0000 mg | ORAL_TABLET | Freq: Every day | ORAL | 0 refills | Status: DC | PRN

## 2018-11-14 MED ORDER — POTASSIUM CHLORIDE CRYS ER 20 MEQ PO TBCR: 20.0000 meq | EXTENDED_RELEASE_TABLET | Freq: Every day | ORAL | 0 refills | Status: DC

## 2018-11-14 MED ORDER — POTASSIUM CHLORIDE CRYS ER 20 MEQ PO TBCR
40.0000 meq | EXTENDED_RELEASE_TABLET | Freq: Once | ORAL | Status: AC
  Administered 2018-11-14: 40 meq via ORAL
  Filled 2018-11-14: qty 2

## 2018-11-14 MED ORDER — TAMSULOSIN HCL 0.4 MG PO CAPS: 0.4000 mg | ORAL_CAPSULE | Freq: Every day | ORAL | 0 refills | Status: DC

## 2018-11-14 NOTE — Evaluation (Signed)
Physical Therapy Evaluation & Discharge Patient Details Name: Ronald Kaiser MRN: [id number] DOB: 17-Sep-1934 Today's Date: 11/14/2018  History of Present Illness  Pt is an 85 y.o. male admitted 11/11/18 with worsening DOE and BLE edema; worked up for CHF exacerbation. Pt also with AKI likely secondary to obstructive uropathy. PMH includes HTN, CAD, CHF, prostate CA.    Clinical Impression  Patient evaluated by Physical Therapy with no further acute PT needs identified. PTA, pt indep, remains active and lives with wife. Today, pt indep with mobility. Asymptomatic during session (see BP values below). Educ on energy conservation strategies, exercise with CHF and importance of continued mobility. All education has been completed and the patient has no further questions. Acute PT is signing off. Thank you for this referral.  Blood Pressure Values Supine 99/58  Sitting 89/58  Standing 102/68  Standing after 2 min 101/58  Post-ambulation 102/76      Follow Up Recommendations No PT follow up;Supervision - Intermittent    Equipment Recommendations  None recommended by PT    Recommendations for Other Services       Precautions / Restrictions Precautions Precautions: Other (comment) Precaution Comments: Baseline hypotension Restrictions Weight Bearing Restrictions: No      Mobility  Bed Mobility Overal bed mobility: Independent                Transfers Overall transfer level: Independent Equipment used: None                Ambulation/Gait Ambulation/Gait assistance: Independent Gait Distance (Feet): 450 Feet Assistive device: None Gait Pattern/deviations: Step-through pattern;Decreased stride length Gait velocity: Decreased Gait velocity interpretation: 1.31 - 2.62 ft/sec, indicative of limited community ambulator General Gait Details: Slow, steady gait indep; able to manage foley bag independently. 1x brief standing rest break secondary to fatigue  Stairs            Wheelchair Mobility    Modified Rankin (Stroke Patients Only)       Balance Overall balance assessment: No apparent balance deficits (not formally assessed)                                           Pertinent Vitals/Pain Pain Assessment: No/denies pain    Home Living Family/patient expects to be discharged to:: Private residence Living Arrangements: Spouse/significant other Available Help at Discharge: Family;Available 24 hours/day Type of Home: House Home Access: Level entry     Home Layout: One level Home Equipment: Walker - 2 wheels;Cane - single point      Prior Function Level of Independence: Independent         Comments: Drives. Stays active. Wife also independent although with cataracts; "we care for each other"     Hand Dominance        Extremity/Trunk Assessment   Upper Extremity Assessment Upper Extremity Assessment: Overall WFL for tasks assessed    Lower Extremity Assessment Lower Extremity Assessment: Overall WFL for tasks assessed    Cervical / Trunk Assessment Cervical / Trunk Assessment: Normal  Communication   Communication: No difficulties  Cognition Arousal/Alertness: Awake/alert Behavior During Therapy: WFL for tasks assessed/performed Overall Cognitive Status: Within Functional Limits for tasks assessed  General Comments      Exercises     Assessment/Plan    PT Assessment Patent does not need any further PT services  PT Problem List         PT Treatment Interventions      PT Goals (Current goals can be found in the Care Plan section)  Acute Rehab PT Goals PT Goal Formulation: All assessment and education complete, DC therapy    Frequency     Barriers to discharge        Co-evaluation               AM-PAC PT "6 Clicks" Mobility  Outcome Measure Help needed turning from your back to your side while in a flat bed without  using bedrails?: None Help needed moving from lying on your back to sitting on the side of a flat bed without using bedrails?: None Help needed moving to and from a bed to a chair (including a wheelchair)?: None Help needed standing up from a chair using your arms (e.g., wheelchair or bedside chair)?: None Help needed to walk in hospital room?: None Help needed climbing 3-5 steps with a railing? : A Little 6 Click Score: 23    End of Session Equipment Utilized During Treatment: Gait belt Activity Tolerance: Patient tolerated treatment well Patient left: in bed;with call bell/phone within reach Nurse Communication: Mobility status PT Visit Diagnosis: Other abnormalities of gait and mobility (R26.89)    Time: 9758-8325 PT Time Calculation (min) (ACUTE ONLY): 29 min   Charges:   PT Evaluation $PT Eval Moderate Complexity: 1 Mod PT Treatments $Self Care/Home Management: 8-22      Mabeline Caras, PT, DPT Acute Rehabilitation Services  Pager [phone] Office [phone]  Derry Lory 11/14/2018, 10:24 AM

## 2018-11-14 NOTE — Discharge Instructions (Signed)

## 2018-11-14 NOTE — Care Management Important Message (Signed)
Important Message  Patient Details  Name: Ronald Kaiser MRN: [id number] Date of Birth: 07/29/1934   Medicare Important Message Given:  Yes    Barb Merino Tricia [place] 11/14/2018, 4:29 PM

## 2018-11-14 NOTE — Progress Notes (Signed)
Progress Note  Patient Name: Ronald Kaiser Date of Encounter: 11/14/2018  Primary Cardiologist: Kirk Ruths, MD   Subjective   Doing very well, no complaints. Reviewed echo results.   Inpatient Medications    Scheduled Meds: . aspirin EC  81 mg Oral BID PC  . heparin  5,000 Units Subcutaneous Q8H  . metoprolol tartrate  25 mg Oral BID  . multivitamin with minerals  1 tablet Oral QPC breakfast  . potassium chloride  40 mEq Oral Once  . pravastatin  20 mg Oral QPC supper  . sodium chloride flush  3 mL Intravenous Q12H  . tamsulosin  0.4 mg Oral QPC supper   Continuous Infusions: . sodium chloride     PRN Meds: sodium chloride, acetaminophen, ondansetron (ZOFRAN) IV, sodium chloride flush   Vital Signs    Vitals:   11/13/18 2017 11/14/18 0454 11/14/18 0455 11/14/18 0857  BP: 108/60  113/67 (!) 104/55  Pulse: 78  70 79  Resp: 18  18   Temp: 98.2 F (36.8 C)  97.6 F (36.4 C) 98.7 F (37.1 C)  TempSrc: Oral  Oral Oral  SpO2: 94%  93% 92%  Weight:  89.8 kg    Height:        Intake/Output Summary (Last 24 hours) at 11/14/2018 0906 Last data filed at 11/14/2018 0500 Gross per 24 hour  Intake 1320 ml  Output 1125 ml  Net 195 ml   Last 3 Weights 11/14/2018 11/13/2018 11/12/2018  Weight (lbs) 198 lb 194 lb 3.2 oz 205 lb 1.6 oz  Weight (kg) 89.812 kg 88.089 kg 93.033 kg      Telemetry    Back to sinus rhythm/sinus arrhythmia - Personally Reviewed  ECG    From 2/9, sinus bradycardia with PACs - Personally Reviewed  Physical Exam   GEN: No acute distress.   Neck: JVD not visible sitting upright Cardiac: regular S1 and S2, no murmurs, rubs, or gallops.  Respiratory: Clear to auscultation bilaterally. GI: Soft, nontender, non-distended  MS: No edema; No deformity. Neuro:  Nonfocal  Psych: Normal affect   Labs    Chemistry Recent Labs  Lab 11/11/18 1758  11/13/18 0445 11/13/18 1654 11/14/18 0413  NA 141   < > 143 142 142  K 2.8*   < > 2.8* 3.3*  3.3*  CL 103   < > 100 100 101  CO2 24   < > 29 31 29   GLUCOSE 124*   < > 146* 119* 108*  BUN 25*   < > 28* 37* 31*  CREATININE 2.42*   < > 1.91* 1.87* 1.40*  CALCIUM 8.5*   < > 9.0 8.6* 8.8*  PROT 6.1*  --   --   --   --   ALBUMIN 3.1*  --  2.8* 2.5* 2.5*  AST 21  --   --   --   --   ALT 19  --   --   --   --   ALKPHOS 39  --   --   --   --   BILITOT 0.7  --   --   --   --   GFRNONAA 24*   < > 32* 33* 46*  GFRAA 28*   < > 37* 38* 54*  ANIONGAP 14   < > 14 11 12    < > = values in this interval not displayed.     Hematology Recent Labs  Lab 11/11/18 1758  WBC 9.5  RBC 3.40*  HGB 10.5*  HCT 33.1*  MCV 97.4  MCH 30.9  MCHC 31.7  RDW 12.9  PLT 201    Cardiac Enzymes Recent Labs  Lab 11/12/18 0429 11/12/18 1146 11/12/18 1803  TROPONINI 0.06* 0.05* 0.06*    Recent Labs  Lab 11/11/18 1838 11/11/18 2332  TROPIPOC 0.07 0.08     BNP Recent Labs  Lab 11/11/18 1758  BNP 1,498.0*     DDimer No results for input(s): DDIMER in the last 168 hours.   Radiology    Dg Chest 2 View  Result Date: 11/13/2018 CLINICAL DATA:  Reason for exam: chf Patient reports he passed out this morning while trying to get out of bed. He did not fall to ground, only back into his bed. Denies any sob. Denies any chest pains. Denies any cough or congestion. History of prostate cancer and MI. History of smoking. EXAM: CHEST - 2 VIEW COMPARISON:  11/11/2018 FINDINGS: Heart size is normal. There is mild perihilar peribronchial thickening. There has been significant improvement in aeration. No new consolidations. Small residual pleural effusions. IMPRESSION: Significant improvement in aeration. Residual mild interstitial edema and small effusions. Electronically Signed   By: Nolon Nations M.D.   On: 11/13/2018 09:02   US Renal  Result Date: 11/12/2018 CLINICAL DATA:  Acute kidney injury EXAM: RENAL / URINARY TRACT ULTRASOUND COMPLETE COMPARISON:  None. FINDINGS: Right Kidney: Renal  measurements: 13 x 7 x 7 cm . Mild hydronephrosis. Discrete 11 mm echogenic area favoring stone. Left Kidney: Renal measurements: 12 x 7 x 5 cm. Mild hydronephrosis. A measured echogenic area is not convincing for calculus. Bladder: Distended urinary bladder IMPRESSION: 1. Full urinary bladder with mild bilateral hydronephrosis. 2. Non-obstructing right renal calculus. Electronically Signed   By: Monte Fantasia M.D.   On: 11/12/2018 10:14    Cardiac Studies   Echo 11/13/18  1. The left ventricle has mild-moderately reduced systolic function of 81-82%. The cavity size was normal. There is mild concentric left ventricular hypertrophy. Left ventricular diastolic Doppler parameters are consistent with impaired relaxation.  2. There is akinesis of the apical anterior and anteroseptal left ventricular segments, consistent with infarction in the distribution of the distal LAD artery.  3. The right ventricle has normal systolic function. The cavity was normal. There is no increase in right ventricular wall thickness.  4. The mitral valve is normal in structure.  5. The tricuspid valve is normal in structure.  6. The aortic valve is tricuspid, there is sclerosis without stenosis. Aortic valve regurgitation is trivial by color flow Doppler.  Patient Profile     85 y.o. male with PMH HTN, HL, CAD, chronic systolic HF (99-37%) who is being seen today for the evaluation of volume overload at the request of Dr Marthenia Rolling.  Assessment & Plan    Acute on chronic systolic and diastolic heart failure: now euvolemic after brisk diuresis. Last EF 30-35% -repeat echo with mild improvement in EF, 16-96%, grade 1 diastolic dysfunction. Area of akinesis consistent with known prior infarction. -Cr improving post catheter placement, now 1.40 from 2.9 on admission -would hold on diuretics for now given brisk autodiuresis, hypokalemia. Net negative nearly 10 liters, weight from 93 kg to 89.8 kg -daily weights, strict I/O,  sodium restriction  CAD: no chest pain, last cath 2017 -continue aspirin -elevated troponin is flat in setting of AKI, not consistent with ACS  Acute kidney injury: suspect obstructive given brisk urine output and improvement in Cr after foley placement. Rapidly improved.  Atrial  fibrillation with RVR: brief episode during hypokalemia, now in sinus rhythm.  -keep K>4, Mg>2. Hypokalemic today at 3.3 -Control with beta blockers, no diltiazem given low EF. Would be aware that he is in sinus bradycardia typically when he is in sinus rhythm, so would avoid over blockade of AV node. -will avoid anticoagulation for now as this is likely in the setting of hypokalemia, but if it persists as an outpatient will need to discuss anticoagulation.  [facility] will sign off.   Medication Recommendations:  Continue aspirin 81 mg, lisinopril once creatinine normalized and stable, metoprolol 25 mg BID (decreased dose), pravastatin Other recommendations (labs, testing, etc): per primary team Follow up as an outpatient:  Will arrange follow up with Dr. Stanford Breed  TIME SPENT WITH PATIENT: 25 minutes of direct patient care. More than 50% of that time was spent on coordination of care and counseling regarding heart failure.  Buford Dresser, MD, PhD [facility]   For questions or updates, please contact [place] Please consult [email or web address] for contact info under     Signed, Buford Dresser, MD  11/14/2018, 9:06 AM

## 2018-11-18 NOTE — Discharge Summary (Signed)
[facility] Discharge Summary   Patient: Ronald Kaiser DPO:[id number]   PCP: Christain [place], MD DOB: 07/09/1934   Date of admission: 11/11/2018   Date of discharge: 11/14/2018     Discharge Diagnoses:   Principal Problem:   Acute on chronic systolic CHF (congestive heart failure) ([place]) Active Problems:   Hyperlipidemia   CAD (coronary artery disease)   Hypertension   AKI (acute kidney injury) ([place])   Admitted From: home Disposition:  home  Recommendations for Outpatient Follow-up:  1. Please follow up with PCP in 1week   Follow-up Information    Ardis Hughs, MD. Go on 11/23/2018.   Specialty:  Urology Why:  @2 :00pm Contact information: [place] [address] [phone]        Christain [place], MD. Go on 11/27/2018.   Specialty:  Family Medicine Why:  @2 :00pm please arrive 1:40pm Contact information: [address] [place] [address] [phone]        Lelon Perla, MD. Go on 11/28/2018.   Specialty:  Cardiology Why:  @10 :00am Contact information: [address] [place] [address] [phone]          Diet recommendation: cardiac diet  Activity: The patient is advised to gradually reintroduce usual activities.  Discharge Condition: good  Code Status: full code  History of present illness: As per the H and P dictated on admission, "Ronald Kaiser is a 85 y.o. male with medical history significant of HTN, MI, CAD, EF 30-35% in 2017.  Patient had gradually worsening DOE and increased BLE edema over past weeks.  Wife, who has CHF, recognized symptoms and attempted treatment at home.  20mg  PO lasix on Thurs and Fri last week, 40mg  PO lasix on Sat, 20mg  PO lasix on Sunday.  Good UOP improvement in BLE edema, DOE and SOB still persistent however.  Patient brought in to ED."  Hospital Course:  Summary of his active problems in the hospital is as following. Acute on chronic systolic CHF: Echocardiogram  unremarkable. Last echo done in 2017 revealed EF of 30 to 35% Patient is 9.7 L negative. No further diuretics needed.    AKI: Likely secondary to obstructive uropathy. Continue to monitor renal function and electrolytes.  Serum creatinine improved Patient has Foley catheter in place. Awaiting urology input. Patient will need to follow-up with urology on discharge.  Hypertension: Hypotension was noted earlier today following aggressive diuresis.   Hypotension has resolved with volume resuscitation. Continue to hold lisinopril. Optimize blood pressure control.  Hypokalemia: Replaced.   Hyperlipidemia:  Continue statin.   Patient was seen by physical therapy, who recommended home health, which was arranged by case manager. On the day of the discharge the patient's vitals were stable, and no other acute medical condition were reported by patient. the patient was felt safe to be discharge at home with home health.  Consultants: cardiology  Procedures: Echocardiogram   DISCHARGE MEDICATION: Allergies as of 11/14/2018   No Known Allergies     Medication List    STOP taking these medications   lisinopril 20 MG tablet Commonly known as:  PRINIVIL,ZESTRIL     TAKE these medications   aspirin EC 81 MG tablet Take 81 mg by mouth 2 (two) times daily after a meal.   Co Q 10 100 MG Caps Take 100 mg by mouth daily after supper.   Fish Oil 1000 MG Caps Take 1,000 mg by mouth daily after breakfast.   furosemide 20  MG tablet Commonly known as:  LASIX Take 1 tablet (20 mg total) by mouth daily as needed for fluid or edema.   metoprolol tartrate 50 MG tablet Commonly known as:  LOPRESSOR Take 1 tablet (50 mg total) by mouth 2 (two) times daily. What changed:  when to take this   multivitamin with minerals Tabs tablet Take 1 tablet by mouth daily after breakfast. Centrum Silver   nitroGLYCERIN 0.4 MG SL tablet Commonly known as:  NITROSTAT Place 1 tablet (0.4 mg total)  under the tongue every 5 (five) minutes as needed. What changed:  reasons to take this   potassium chloride SA 20 MEQ tablet Commonly known as:  K-DUR,KLOR-CON Take 1 tablet (20 mEq total) by mouth daily for 7 days.   pravastatin 20 MG tablet Commonly known as:  PRAVACHOL Take 1 tablet (20 mg total) by mouth daily. What changed:  when to take this   tamsulosin 0.4 MG Caps capsule Commonly known as:  FLOMAX Take 1 capsule (0.4 mg total) by mouth daily after supper.      No Known Allergies Discharge Instructions    Diet - low sodium heart healthy   Complete by:  As directed    Discharge instructions   Complete by:  As directed    It is important that you read the given instructions as well as go over your medication list with RN to help you understand your care after this hospitalization.  Discharge Instructions: Please follow-up with PCP in 1-2 weeks  Please request your primary care physician to go over all Hospital Tests and Procedure/Radiological results at the follow up. Please get all Hospital records sent to your PCP by signing hospital release before you go home.   Do not take more than prescribed Pain, Sleep and Anxiety Medications. You were cared for by a hospitalist during your hospital stay. If you have any questions about your discharge medications or the care you received while you were in the hospital after you are discharged, you can call the unit @UNIT @ you were admitted to and ask to speak with the hospitalist on call if the hospitalist that took care of you is not available.  Once you are discharged, your primary care physician will handle any further medical issues. Please note that NO REFILLS for any discharge medications will be authorized once you are discharged, as it is imperative that you return to your primary care physician (or establish a relationship with a primary care physician if you do not have one) for your aftercare needs so that they can reassess  your need for medications and monitor your lab values. You Must read complete instructions/literature along with all the possible adverse reactions/side effects for all the Medicines you take and that have been prescribed to you. Take any new Medicines after you have completely understood and accept all the possible adverse reactions/side effects. Wear Seat belts while driving. If you have smoked or chewed Tobacco in the last 2 yrs please stop smoking and/or stop any Recreational drug use.  If you drink alcohol, please moderate the use and do not drive, operating heavy machinery, perform activities at heights, swimming or participation in water activities or provide baby sitting services under influence.   Increase activity slowly   Complete by:  As directed    Urinary leg bag   Complete by:  As directed      Discharge Exam: Filed Weights   11/12/18 1711 11/13/18 0512 11/14/18 0454  Weight: 93 kg 88.1  kg 89.8 kg   Vitals:   11/14/18 0857 11/14/18 1157  BP: (!) 104/55 124/71  Pulse: 79 67  Resp:  20  Temp: 98.7 F (37.1 C) 98.2 F (36.8 C)  SpO2: 92% 96%   General: Appear in no distress, no Rash; Oral Mucosa moist. Cardiovascular: S1 and S2 Present, no Murmur, no JVD Respiratory: Bilateral Air entry present and Clear to Auscultation, no Crackles, no wheezes Abdomen: Bowel Sound present, Soft and no tenderness Extremities: no Pedal edema, no calf tenderness Neurology: Grossly no focal neuro deficit.  The results of significant diagnostics from this hospitalization (including imaging, microbiology, ancillary and laboratory) are listed below for reference.    Significant Diagnostic Studies: Dg Chest 2 View  Result Date: 11/13/2018 CLINICAL DATA:  Reason for exam: chf Patient reports he passed out this morning while trying to get out of bed. He did not fall to ground, only back into his bed. Denies any sob. Denies any chest pains. Denies any cough or congestion. History of prostate  cancer and MI. History of smoking. EXAM: CHEST - 2 VIEW COMPARISON:  11/11/2018 FINDINGS: Heart size is normal. There is mild perihilar peribronchial thickening. There has been significant improvement in aeration. No new consolidations. Small residual pleural effusions. IMPRESSION: Significant improvement in aeration. Residual mild interstitial edema and small effusions. Electronically Signed   By: Nolon Nations M.D.   On: 11/13/2018 09:02   US Renal  Result Date: 11/12/2018 CLINICAL DATA:  Acute kidney injury EXAM: RENAL / URINARY TRACT ULTRASOUND COMPLETE COMPARISON:  None. FINDINGS: Right Kidney: Renal measurements: 13 x 7 x 7 cm . Mild hydronephrosis. Discrete 11 mm echogenic area favoring stone. Left Kidney: Renal measurements: 12 x 7 x 5 cm. Mild hydronephrosis. A measured echogenic area is not convincing for calculus. Bladder: Distended urinary bladder IMPRESSION: 1. Full urinary bladder with mild bilateral hydronephrosis. 2. Non-obstructing right renal calculus. Electronically Signed   By: Monte Fantasia M.D.   On: 11/12/2018 10:14   Dg Chest Portable 1 View  Result Date: 11/11/2018 CLINICAL DATA:  Shortness of breath, bilateral lower extremity swelling EXAM: PORTABLE CHEST 1 VIEW COMPARISON:  03/07/2016 FINDINGS: Pulmonary vascular congestion with bilateral perihilar opacities, left greater than right, suspicious for mild interstitial edema. Technically, left upper lobe/perihilar infection is possible. Suspected small bilateral pleural effusions.  No pneumothorax. The heart is normal in size.  Thoracic aortic atherosclerosis. IMPRESSION: Suspected mild interstitial edema. Left upper lobe/perihilar infection is possible. Small bilateral pleural effusions. Electronically Signed   By: Julian Hy M.D.   On: 11/11/2018 18:49    Microbiology: Recent Results (from the past 240 hour(s))  Culture, Urine     Status: None   Collection Time: 11/12/18  4:42 PM  Result Value Ref Range Status    Specimen Description URINE, CATHETERIZED  Final   Special Requests NONE  Final   Culture   Final    NO GROWTH Performed at [facility], [address] [place], [place] [address]    Report Status 11/13/2018 FINAL  Final     Labs: CBC: No results for input(s): WBC, NEUTROABS, HGB, HCT, MCV, PLT in the last 168 hours. Basic Metabolic Panel: Recent Labs  Lab 11/12/18 0429 11/13/18 0445 11/13/18 1654 11/14/18 0413  NA 140 143 142 142  K 3.2* 2.8* 3.3* 3.3*  CL 107 100 100 101  CO2 21* 29 31 29   GLUCOSE 125* 146* 119* 108*  BUN 29* 28* 37* 31*  CREATININE 2.90* 1.91* 1.87* 1.40*  CALCIUM 7.9* 9.0 8.6* 8.8*  MG  --   --   --  1.9  PHOS  --  4.4 4.0 3.2  3.2   Liver Function Tests: Recent Labs  Lab 11/13/18 0445 11/13/18 1654 11/14/18 0413  ALBUMIN 2.8* 2.5* 2.5*   No results for input(s): LIPASE, AMYLASE in the last 168 hours. No results for input(s): AMMONIA in the last 168 hours. Cardiac Enzymes: Recent Labs  Lab 11/12/18 0429 11/12/18 1146 11/12/18 1803  TROPONINI 0.06* 0.05* 0.06*   BNP (last 3 results) Recent Labs    11/11/18 1758  BNP 1,498.0*   CBG: No results for input(s): GLUCAP in the last 168 hours. Time spent: 35 minutes  Signed:  Berle Mull  [facility] 11/14/2018

## 2018-11-23 DIAGNOSIS — R339 Retention of urine, unspecified: Secondary | ICD-10-CM | POA: Diagnosis not present

## 2018-11-27 DIAGNOSIS — I5020 Unspecified systolic (congestive) heart failure: Secondary | ICD-10-CM | POA: Diagnosis not present

## 2018-11-27 DIAGNOSIS — N320 Bladder-neck obstruction: Secondary | ICD-10-CM | POA: Diagnosis not present

## 2018-11-27 DIAGNOSIS — N179 Acute kidney failure, unspecified: Secondary | ICD-10-CM | POA: Insufficient documentation

## 2018-11-27 DIAGNOSIS — Z09 Encounter for follow-up examination after completed treatment for conditions other than malignant neoplasm: Secondary | ICD-10-CM | POA: Diagnosis not present

## 2018-11-27 DIAGNOSIS — E8779 Other fluid overload: Secondary | ICD-10-CM | POA: Diagnosis not present

## 2018-11-28 ENCOUNTER — Ambulatory Visit (INDEPENDENT_AMBULATORY_CARE_PROVIDER_SITE_OTHER): Payer: Medicare Other | Admitting: Adult Health

## 2018-11-28 ENCOUNTER — Encounter: Payer: Self-pay | Admitting: Adult Health

## 2018-11-28 VITALS — BP 120/58 | HR 53 | Ht 73.0 in | Wt 201.6 lb

## 2018-11-28 DIAGNOSIS — I5022 Chronic systolic (congestive) heart failure: Secondary | ICD-10-CM | POA: Diagnosis not present

## 2018-11-28 DIAGNOSIS — E7800 Pure hypercholesterolemia, unspecified: Secondary | ICD-10-CM

## 2018-11-28 DIAGNOSIS — I480 Paroxysmal atrial fibrillation: Secondary | ICD-10-CM | POA: Diagnosis not present

## 2018-11-28 DIAGNOSIS — I2510 Atherosclerotic heart disease of native coronary artery without angina pectoris: Secondary | ICD-10-CM | POA: Diagnosis not present

## 2018-11-28 NOTE — Progress Notes (Signed)
Cardiology Office Note   Date:  11/28/2018   ID:  Ronald Kaiser, DOB 03-Feb-1934, MRN [id number]  PCP:  Ronald Sacramento, MD  Cardiologist:  [facility]  Chief Complaint  Patient presents with  . Atrial Fibrillation  . Congestive Heart Failure     History of Present Illness: Ronald Kaiser is a 85 y.o. male who presents for post hospital follow up after admission for worsening dyspnea and LEE. He began taking is wife's lasix to help but breathing did not improve. He was seen in the ER and began on IV lasix, with CXR confirming pulmonary edema and pleural effusion. He was admitted for diureses.   Echo demonstrated EF of 40%-45% with normal cavity size and concentric LVH. He was diuresed 10 liters with weight down from 93 kg to 89.8 kg. (197 lbs). He had brief episode of atrial fibrillation with RVR, with hypokalemia. No anticoagulation was started. He was discharged on 11/14/2018 and advised to take lasix 20 mg on Thursday and Fridays, and 40 mg on Sat 20 mg and then prn.   He comes today without any complaints. He denies DOE, edema or rapid HR. He is medically compliant. He is not on ACE at this time. Creatinine was rechecked by his PCP through [facility] on 11/27/2018, creatinine 0.91. Na 140, K+ 4.7, Glucose 112. BUN/Creatinine ratio not calculated.   He was seen by urology and now has an indwelling urinary catheter with leg bag. This has been working well for him and fluid retention has not been an issue, with good diureses.   Past Medical History:  Diagnosis Date  . AKI (acute kidney injury) ([place]) 11/12/2018  . CAD (coronary artery disease)   . CHF (congestive heart failure) ([place])   . Dyspnea   . HTN (hypertension)   . Hyperlipidemia   . Myocardial infarction ([place]) 2000  . Obesity   . Prostate cancer [facility])     Past Surgical History:  Procedure Laterality Date  . CARDIAC CATHETERIZATION N/A 03/09/2016   Procedure: Left Heart Cath and Coronary Angiography;  Surgeon: Ronald Crome, MD;   Location: [place] [facility];  Service: Cardiovascular;  Laterality: N/A;  . RADIATION TO PROSTATE    . Stenting of LAD    . TONSILLECTOMY       Current Outpatient Medications  Medication Sig Dispense Refill  . aspirin EC 81 MG tablet Take 81 mg by mouth 2 (two) times daily after a meal.    . Coenzyme Q10 (CO Q 10) 100 MG CAPS Take 100 mg by mouth daily after supper.     . metoprolol tartrate (LOPRESSOR) 50 MG tablet Take 1 tablet (50 mg total) by mouth 2 (two) times daily. (Patient taking differently: Take 50 mg by mouth 2 (two) times daily after a meal. ) 180 tablet 2  . Multiple Vitamin (MULTIVITAMIN WITH MINERALS) TABS tablet Take 1 tablet by mouth daily after breakfast. Centrum Silver    . nitroGLYCERIN (NITROSTAT) 0.4 MG SL tablet Place 1 tablet (0.4 mg total) under the tongue every 5 (five) minutes as needed. (Patient taking differently: Place 0.4 mg under the tongue every 5 (five) minutes as needed for chest pain. ) 25 tablet 12  . Omega-3 Fatty Acids (FISH OIL) 1000 MG CAPS Take 1,000 mg by mouth daily after breakfast.     . potassium chloride SA (K-DUR,KLOR-CON) 20 MEQ tablet Take 1 tablet (20 mEq total) by mouth daily for 7 days. 7 tablet 0  . pravastatin (PRAVACHOL) 20 MG  tablet Take 1 tablet (20 mg total) by mouth daily. (Patient taking differently: Take 20 mg by mouth daily after supper. ) 90 tablet 2  . tamsulosin (FLOMAX) 0.4 MG CAPS capsule Take 1 capsule (0.4 mg total) by mouth daily after supper. 30 capsule 0   No current facility-administered medications for this visit.     Allergies:   Patient has no known allergies.    Social History:  The patient  reports that he quit smoking about 56 years ago. He has never used smokeless tobacco. He reports previous alcohol use. He reports previous drug use.   Family History:  The patient's family history includes Hypertension in his father.    ROS: All other systems are reviewed and negative. Unless otherwise mentioned in H&P      PHYSICAL EXAM: VS:  BP (!) 120/58   Pulse (!) 53   Ht 6\' 1"  (1.854 m)   Wt 201 lb 9.6 oz (91.4 kg)   SpO2 98%   BMI 26.60 kg/m  , BMI Body mass index is 26.6 kg/m. GEN: Well nourished, well developed, in no acute distress HEENT: normal Neck: no JVD, carotid bruits, or masses Cardiac: RRR; 1/6 systolic murmurs, rubs, or gallops,no edema  Respiratory:  Clear to auscultation bilaterally, normal work of breathing GI: soft, nontender, nondistended, + BS MS: no deformity or atrophy Skin: warm and dry, no rash Neuro:  Strength and sensation are intact Psych: euthymic mood, full affect   EKG:  Not completed this office visit.   Recent Labs: 11/11/2018: ALT 19; B Natriuretic Peptide 1,498.0; Hemoglobin 10.5; Platelets 201 11/14/2018: BUN 31; Creatinine, Ser 1.40; Magnesium 1.9; Potassium 3.3; Sodium 142    Lipid Panel    Component Value Date/Time   CHOL 185 05/07/2018 1141   TRIG 226 (H) 05/07/2018 1141   HDL 49 05/07/2018 1141   CHOLHDL 3.8 05/07/2018 1141   CHOLHDL 3.9 01/21/2016 1027   VLDL 31 (H) 01/21/2016 1027   LDLCALC 91 05/07/2018 1141      Wt Readings from Last 3 Encounters:  11/28/18 201 lb 9.6 oz (91.4 kg)  11/14/18 198 lb (89.8 kg)  05/07/18 206 lb (93.4 kg)      Other studies Reviewed: TTE: 4/17  Study Conclusions  - Left ventricle: The cavity size was normal. Wall thickness was normal. Systolic function was moderately to severely reduced. The estimated ejection fraction was in the range of 30% to 35%. There is severe hypokinesis of the anteroseptal and apical myocardium. Doppler parameters are consistent with abnormal left ventricular relaxation (grade 1 diastolic dysfunction). - Aortic valve: There was mild regurgitation. - Aortic root: The aortic root was mildly dilated. - Mitral valve: There was mild regurgitation.  Impressions:  - Severe hypokinesis of the anteroseptal wall and apex; overall moderate to severe LV  dysfunction; grade 1 diastolic dysfunction; mild AI and MR; trace TR.  Cath: 6/17  1. Ost RCA to Prox RCA lesion, 50% stenosed. 2. Prox LAD to Dist LAD lesion, 45% stenosed. The lesion was previously treated with a stent (unknown type). 3. Prox Cx to Mid Cx lesion, 50% stenosed.   Mild-to-moderate LAD in-stent restenosis without any evidence of high-grade obstruction.  Moderate proximal RCA stenosis greater than 50%.  There is mild to moderate mid circumflex stenosis.  Low normal LV systolic function with an estimated EF 45-50%. Mildly elevated left ventricular end-diastolic pressures.  Abnormal noninvasive studies, high risk myocardial perfusion imaging without anatomy to explain any evidence of ischemia. Decreased EF and scarring  from prior infarction is the likely explanation for the interpretation  ASSESSMENT AND PLAN:  1. CAD: Cardiac cath in 2017 demonstrated LAD instent restenosis without any evidence of high grade obstruction. He will continue on secondary prevention with statin therapy. I will not restart ACE at this time due to soft BP.  Will follow this.   2. PAF: In the setting of hypokalemia. No changes in his regimen or addition of anticoagulation at this time per cardiology notes during hospitalization.   3. Chronic Systolic CHF: Most recent EF of 40%-45% with normal cavity size and concentric LVH. Continue daily lasix. Consider adding ACE on follow up.   4. Urethral obstruction in the setting of BPH: Now followed by urologist with indwelling urinary catheter. He is tolerating this well and is having improvement of kidney function.   5. Hypercholesterolemia: Continue statin therapy.   Current medicines are reviewed at length with the patient today.    Labs/ tests ordered today include: None  Phill Myron. West Pugh, ANP, AACC   11/28/2018 12:20 PM    [facility] [address] Office [phone] Fax [phone]

## 2018-11-28 NOTE — Patient Instructions (Signed)
Follow-Up: You will need a follow up appointment in 6 months.  Please call our office 2 months in advance (June 2020) to schedule this (AUGUST 2020) appointment.  You may see Kirk Ruths, MD or one of the following Advanced Practice Providers on your designated Care Team:  Kerin Ransom, [place]  Roby Lofts, [name]  Sande Rives, [place]        Medication Instructions:  NO CHANGES- Your physician recommends that you continue on your current medications as directed. Please refer to the Current Medication list given to you today. If you need a refill on your cardiac medications before your next appointment, please call your pharmacy. Labwork: When you have labs (blood work) and your tests are completely normal, you will receive your results ONLY by [place] (if you have MyChart) -OR- A paper copy in the mail.  At [facility], you and your health needs are our priority.  As part of our continuing mission to provide you with exceptional heart care, we have created designated Provider Care Teams.  These Care Teams include your primary Cardiologist (physician) and Advanced Practice Providers (APPs -  Physician Assistants and Nurse Practitioners) who all work together to provide you with the care you need, when you need it.  Thank you for choosing [facility] at [facility]!!

## 2018-11-30 DIAGNOSIS — R339 Retention of urine, unspecified: Secondary | ICD-10-CM | POA: Diagnosis not present

## 2018-11-30 DIAGNOSIS — C61 Malignant neoplasm of prostate: Secondary | ICD-10-CM | POA: Diagnosis not present

## 2018-12-05 DIAGNOSIS — R339 Retention of urine, unspecified: Secondary | ICD-10-CM | POA: Diagnosis not present

## 2018-12-07 DIAGNOSIS — R9721 Rising PSA following treatment for malignant neoplasm of prostate: Secondary | ICD-10-CM | POA: Diagnosis not present

## 2018-12-07 DIAGNOSIS — R339 Retention of urine, unspecified: Secondary | ICD-10-CM | POA: Diagnosis not present

## 2018-12-11 ENCOUNTER — Other Ambulatory Visit: Payer: Self-pay | Admitting: Urology

## 2019-01-01 DIAGNOSIS — R339 Retention of urine, unspecified: Secondary | ICD-10-CM | POA: Diagnosis not present

## 2019-01-01 DIAGNOSIS — R9721 Rising PSA following treatment for malignant neoplasm of prostate: Secondary | ICD-10-CM | POA: Diagnosis not present

## 2019-01-01 DIAGNOSIS — C61 Malignant neoplasm of prostate: Secondary | ICD-10-CM | POA: Diagnosis not present

## 2019-01-14 DIAGNOSIS — Z8546 Personal history of malignant neoplasm of prostate: Secondary | ICD-10-CM | POA: Diagnosis not present

## 2019-01-14 DIAGNOSIS — R339 Retention of urine, unspecified: Secondary | ICD-10-CM | POA: Diagnosis not present

## 2019-01-18 ENCOUNTER — Encounter (HOSPITAL_COMMUNITY): Admission: RE | Payer: Self-pay | Source: Home / Self Care

## 2019-01-18 ENCOUNTER — Ambulatory Visit (HOSPITAL_COMMUNITY): Admission: RE | Admit: 2019-01-18 | Payer: Medicare Other | Source: Home / Self Care | Admitting: Urology

## 2019-01-18 SURGERY — TURP (TRANSURETHRAL RESECTION OF PROSTATE)
Anesthesia: General

## 2019-01-21 ENCOUNTER — Other Ambulatory Visit: Payer: Self-pay

## 2019-01-21 ENCOUNTER — Emergency Department (HOSPITAL_COMMUNITY)
Admission: EM | Admit: 2019-01-21 | Discharge: 2019-01-21 | Disposition: A | Discharge status: Home / Self Care | Payer: Medicare Other | Attending: Emergency Medicine | Admitting: Emergency Medicine

## 2019-01-21 ENCOUNTER — Encounter (HOSPITAL_COMMUNITY): Payer: Self-pay

## 2019-01-21 DIAGNOSIS — Z7982 Long term (current) use of aspirin: Secondary | ICD-10-CM | POA: Insufficient documentation

## 2019-01-21 DIAGNOSIS — I5022 Chronic systolic (congestive) heart failure: Secondary | ICD-10-CM | POA: Insufficient documentation

## 2019-01-21 DIAGNOSIS — R339 Retention of urine, unspecified: Secondary | ICD-10-CM | POA: Diagnosis not present

## 2019-01-21 DIAGNOSIS — I259 Chronic ischemic heart disease, unspecified: Secondary | ICD-10-CM | POA: Diagnosis not present

## 2019-01-21 DIAGNOSIS — Z436 Encounter for attention to other artificial openings of urinary tract: Secondary | ICD-10-CM | POA: Diagnosis not present

## 2019-01-21 DIAGNOSIS — Z87891 Personal history of nicotine dependence: Secondary | ICD-10-CM | POA: Diagnosis not present

## 2019-01-21 DIAGNOSIS — R310 Gross hematuria: Secondary | ICD-10-CM | POA: Diagnosis not present

## 2019-01-21 DIAGNOSIS — Z79899 Other long term (current) drug therapy: Secondary | ICD-10-CM | POA: Diagnosis not present

## 2019-01-21 DIAGNOSIS — I110 Hypertensive heart disease with heart failure: Secondary | ICD-10-CM | POA: Insufficient documentation

## 2019-01-21 DIAGNOSIS — R109 Unspecified abdominal pain: Secondary | ICD-10-CM | POA: Diagnosis present

## 2019-01-21 LAB — COMPREHENSIVE METABOLIC PANEL
ALT: 16 U/L (ref 0–44)
AST: 23 U/L (ref 15–41)
Albumin: 3.8 g/dL (ref 3.5–5.0)
Alkaline Phosphatase: 49 U/L (ref 38–126)
Anion gap: 12 (ref 5–15)
BUN: 26 mg/dL — ABNORMAL HIGH (ref 8–23)
CO2: 20 mmol/L — ABNORMAL LOW (ref 22–32)
Calcium: 8.9 mg/dL (ref 8.9–10.3)
Chloride: 107 mmol/L (ref 98–111)
Creatinine, Ser: 1.26 mg/dL — ABNORMAL HIGH (ref 0.61–1.24)
GFR calc Af Amer: >60 mL/min (ref >60)
GFR calc non Af Amer: 53 mL/min — ABNORMAL LOW (ref >60)
Glucose, Bld: 179 mg/dL — ABNORMAL HIGH (ref 70–99)
Potassium: 3.8 mmol/L (ref 3.5–5.1)
Sodium: 139 mmol/L (ref 135–145)
Total Bilirubin: 1 mg/dL (ref 0.3–1.2)
Total Protein: 6.9 g/dL (ref 6.5–8.1)

## 2019-01-21 LAB — CBC WITH DIFFERENTIAL/PLATELET
Abs Immature Granulocytes: 0.05 10*3/uL (ref 0.00–0.07)
Basophils Absolute: 0 10*3/uL (ref 0.0–0.1)
Basophils Relative: 0 %
Eosinophils Absolute: 0 10*3/uL (ref 0.0–0.5)
Eosinophils Relative: 0 %
HCT: 38.7 % — ABNORMAL LOW (ref 39.0–52.0)
Hemoglobin: 12.4 g/dL — ABNORMAL LOW (ref 13.0–17.0)
Immature Granulocytes: 0 %
Lymphocytes Relative: 5 %
Lymphs Abs: 0.7 10*3/uL (ref 0.7–4.0)
MCH: 31.7 pg (ref 26.0–34.0)
MCHC: 32 g/dL (ref 30.0–36.0)
MCV: 99 fL (ref 80.0–100.0)
Monocytes Absolute: 0.4 10*3/uL (ref 0.1–1.0)
Monocytes Relative: 3 %
Neutro Abs: 11.8 10*3/uL — ABNORMAL HIGH (ref 1.7–7.7)
Neutrophils Relative %: 92 %
Platelets: 291 10*3/uL (ref 150–400)
RBC: 3.91 MIL/uL — ABNORMAL LOW (ref 4.22–5.81)
RDW: 14.1 % (ref 11.5–15.5)
WBC: 13 10*3/uL — ABNORMAL HIGH (ref 4.0–10.5)
nRBC: 0 % (ref 0.0–0.2)

## 2019-01-21 LAB — URINALYSIS, ROUTINE W REFLEX MICROSCOPIC

## 2019-01-21 LAB — URINALYSIS, MICROSCOPIC (REFLEX): RBC / HPF: >50 RBC/hpf (ref 0–5)

## 2019-01-21 MED ORDER — SODIUM CHLORIDE 0.9 % IV SOLN
1.0000 g | Freq: Once | INTRAVENOUS | Status: AC
  Administered 2019-01-21: 1 g via INTRAVENOUS
  Filled 2019-01-21: qty 10

## 2019-01-21 MED ORDER — CEPHALEXIN 500 MG PO CAPS: 500.0000 mg | ORAL_CAPSULE | Freq: Two times a day (BID) | ORAL | 0 refills | Status: DC

## 2019-01-21 NOTE — ED Provider Notes (Signed)
[place] DEPT Provider Note   CSN: [id number] Arrival date & time: 01/21/19  0746    History   Chief Complaint Chief Complaint  Patient presents with  . Abdominal Pain  . Urinary Retention    HPI Ronald Kaiser is a 85 y.o. male.     The history is provided by the patient and medical records. No language interpreter was used.  Abdominal Pain   Ronald Kaiser is a 85 y.o. male who presents to the Emergency Department complaining of abdominal pain and a Foley catheter problem. He presents the emergency department by EMS complaining that his Foley catheter will not drain. He noticed there was a small amount of blood in the catheter bag yesterday. He emptied his leg bag before going to bed and he had a normal amount of urine at that time. Overnight he had difficulty sleeping and felt like he had to have a bowel movement multiple times. He had no significant drainage from his catheter overnight. He reports abdominal cramping. He also has felt sweaty at times. He denies any fevers, vomiting, diarrhea. He has had a catheter in place since February 9 when he was admitted to the hospital for bladder outlet obstruction. His catheter was last changed two weeks ago. He did have problems with hematuria on his prior catheter that he attributes to difficulty with catheter placement. He was scheduled to see urology today but was unable to drive to his appointment.  Sxs are severe, constant, worsening .  Past Medical History:  Diagnosis Date  . AKI (acute kidney injury) ([place]) 11/12/2018  . CAD (coronary artery disease)   . CHF (congestive heart failure) ([place])   . Dyspnea   . HTN (hypertension)   . Hyperlipidemia   . Myocardial infarction ([place]) 2000  . Obesity   . Prostate cancer [facility])     Patient Active Problem List   Diagnosis Date Noted  . Acute renal failure ([place]) 11/27/2018  . Bladder outlet obstruction 11/27/2018  . Hospital discharge follow-up  11/27/2018  . Acute kidney injury ([place])   . Hypokalemia   . Other hydronephrosis   . Acute on chronic systolic CHF (congestive heart failure) ([place]) 11/11/2018  . AKI (acute kidney injury) ([place]) 11/11/2018  . Basal cell carcinoma (BCC) of skin of nose 01/24/2018  . Abnormal nuclear cardiac imaging test   . Actinic keratoses 01/13/2016  . CHF (congestive heart failure) ([place]) 01/13/2016  . Encounter for general adult medical examination without abnormal findings 01/13/2016  . Male erectile disorder 01/13/2016  . Osteoarthritis 01/13/2016  . Seborrheic keratoses 01/13/2016  . Bruit 11/23/2012  . 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 Mixed hyperlipidemia 11/25/2011  . Coronary arteriosclerosis 11/25/2011  . Hypertension 11/25/2011    Past Surgical History:  Procedure Laterality Date  . CARDIAC CATHETERIZATION N/A 03/09/2016   Procedure: Left Heart Cath and Coronary Angiography;  Surgeon: Belva Crome, MD;  Location: [place] [facility];  Service: Cardiovascular;  Laterality: N/A;  . RADIATION TO PROSTATE    . Stenting of LAD    . TONSILLECTOMY          Home Medications    Prior to Admission medications   Medication Sig Start Date End Date Taking? Authorizing Provider  aspirin EC 81 MG tablet Take 81 mg by mouth 2 (two) times daily after a meal.   Yes [provider]  Coenzyme Q10 (CO Q 10) 100 MG CAPS Take 100 mg by mouth daily.  Yes [provider]  metoprolol tartrate (LOPRESSOR) 50 MG tablet Take 1 tablet (50 mg total) by mouth 2 (two) times daily. Patient taking differently: Take 50 mg by mouth 2 (two) times daily after a meal.  07/09/18  Yes Crenshaw, Denice Bors, MD  Multiple Vitamins-Minerals (CENTRUM SILVER ULTRA MENS PO) Take 1 tablet by mouth daily.    Yes [provider]  nitroGLYCERIN (NITROSTAT) 0.4 MG SL tablet Place 1 tablet (0.4 mg total) under the tongue every 5 (five) minutes as needed. Patient taking differently: Place 0.4  mg under the tongue every 5 (five) minutes as needed for chest pain.  04/17/17  Yes Lelon Perla, MD  pravastatin (PRAVACHOL) 20 MG tablet Take 1 tablet (20 mg total) by mouth daily. Patient taking differently: Take 20 mg by mouth daily after supper.  07/09/18  Yes Lelon Perla, MD  cephALEXin (KEFLEX) 500 MG capsule Take 1 capsule (500 mg total) by mouth 2 (two) times daily. 01/21/19   Quintella Reichert, MD  potassium chloride SA (K-DUR,KLOR-CON) 20 MEQ tablet Take 1 tablet (20 mEq total) by mouth daily for 7 days. Patient not taking: Reported on 01/21/2019 11/14/18 11/28/18  Lavina Hamman, MD  tamsulosin [facility]) 0.4 MG CAPS capsule Take 1 capsule (0.4 mg total) by mouth daily after supper. Patient not taking: Reported on 01/21/2019 11/14/18   Lavina Hamman, MD    Family History Family History  Problem Relation Age of Onset  . Hypertension Father     Social History Social History   Tobacco Use  . Smoking status: Former Smoker    Last attempt to quit: 1964    Years since quitting: 56.3  . Smokeless tobacco: Never Used  Substance Use Topics  . Alcohol use: Not Currently  . Drug use: Not Currently     Allergies   Bactrim [sulfamethoxazole-trimethoprim]   Review of Systems Review of Systems  Gastrointestinal: Positive for abdominal pain.  All other systems reviewed and are negative.    Physical Exam Updated Vital Signs BP (!) 105/56   Pulse 61   Temp 97.6 F (36.4 C) (Oral)   Resp 18   Ht 6\' 1"  (1.854 m)   Wt 87.3 kg   SpO2 97%   BMI 25.38 kg/m   Physical Exam Vitals signs and nursing note reviewed.  Constitutional:      Appearance: He is well-developed.  HENT:     Head: Normocephalic and atraumatic.  Cardiovascular:     Rate and Rhythm: Normal rate and regular rhythm.  Pulmonary:     Effort: Pulmonary effort is normal. No respiratory distress.  Abdominal:     Palpations: Abdomen is soft.     Tenderness: There is no guarding or rebound.      Comments: Moderate lower abdominal tenderness  Genitourinary:    Comments: 16 french foley catheter in place, scant blood in catheter bag.   Musculoskeletal:        General: No swelling or tenderness.  Skin:    General: Skin is warm and dry.     Capillary Refill: Capillary refill takes less than 2 seconds.  Neurological:     Mental Status: He is alert and oriented to person, place, and time.  Psychiatric:        Behavior: Behavior normal.      ED Treatments / Results  Labs (all labs ordered are listed, but only abnormal results are displayed) Labs Reviewed  COMPREHENSIVE METABOLIC PANEL - Abnormal; Notable for the following components:  Result Value   CO2 20 (*)    Glucose, Bld 179 (*)    BUN 26 (*)    Creatinine, Ser 1.26 (*)    GFR calc non Af Amer 53 (*)    All other components within normal limits  CBC WITH DIFFERENTIAL/PLATELET - Abnormal; Notable for the following components:   WBC 13.0 (*)    RBC 3.91 (*)    Hemoglobin 12.4 (*)    HCT 38.7 (*)    Neutro Abs 11.8 (*)    All other components within normal limits  URINALYSIS, ROUTINE W REFLEX MICROSCOPIC - Abnormal; Notable for the following components:   Color, Urine RED (*)    APPearance TURBID (*)    Glucose, UA   (*)    Value: TEST NOT REPORTED DUE TO COLOR INTERFERENCE OF URINE PIGMENT   Hgb urine dipstick   (*)    Value: TEST NOT REPORTED DUE TO COLOR INTERFERENCE OF URINE PIGMENT   Bilirubin Urine   (*)    Value: TEST NOT REPORTED DUE TO COLOR INTERFERENCE OF URINE PIGMENT   Ketones, ur   (*)    Value: TEST NOT REPORTED DUE TO COLOR INTERFERENCE OF URINE PIGMENT   Protein, ur   (*)    Value: TEST NOT REPORTED DUE TO COLOR INTERFERENCE OF URINE PIGMENT   Nitrite   (*)    Value: TEST NOT REPORTED DUE TO COLOR INTERFERENCE OF URINE PIGMENT   Leukocytes,Ua   (*)    Value: TEST NOT REPORTED DUE TO COLOR INTERFERENCE OF URINE PIGMENT   All other components within normal limits  URINALYSIS, MICROSCOPIC  (REFLEX) - Abnormal; Notable for the following components:   Bacteria, UA MANY (*)    All other components within normal limits  URINE CULTURE    EKG None  Radiology No results found.  Procedures Procedures (including critical care time)  Medications Ordered in ED Medications  cefTRIAXone (ROCEPHIN) 1 g in sodium chloride 0.9 % 100 mL IVPB (1 g Intravenous New Bag/Given 01/21/19 1101)     Initial Impression / Assessment and Plan / ED Course  I have reviewed the triage vital signs and the nursing notes.  Pertinent labs & imaging results that were available during my care of the patient were reviewed by me and considered in my medical decision making (see chart for details).        Patient with indwelling a Foley catheter here for evaluation of decreased output from the catheter. Unable to flush catheter when attempted. A new catheter was placed with return of 600 mL of grossly bloodied urine. Patient's abdominal pain resolved after replacement of the catheter. The catheter was then flushed until clear. Urinalysis off of new catheter does demonstrate many bacteria. Patient did have episode of diaphoresis prior to ED arrival, question possible UTI. Will start on antibiotics and send cultures. BMP notes stable renal insufficiency. Plan to discharge home with outpatient neurology follow-up as well as return precautions.  Final Clinical Impressions(s) / ED Diagnoses   Final diagnoses:  Urinary retention  Gross hematuria    ED Discharge Orders         Ordered    cephALEXin (KEFLEX) 500 MG capsule  2 times daily     01/21/19 1054           Quintella Reichert, MD 01/21/19 1155

## 2019-01-21 NOTE — ED Triage Notes (Signed)
Per EMS- Patient changed his foley bag at 2100 last night, but when he woke today he had a small amount of blood-tinged urine in his foley bag. Patient also c/o lower abdominal pain. Patient reported that he made an appointment with his urologist, but was unable to drive himself.

## 2019-01-21 NOTE — ED Notes (Signed)
Attempted to irrigate foley. EDP notified and verbal order for coude catheter #16.

## 2019-01-21 NOTE — ED Notes (Signed)
Attempted to get patient in a gown,but refused.

## 2019-01-21 NOTE — ED Notes (Signed)
Pt requested and given leg strap catheter bag.

## 2019-01-21 NOTE — ED Notes (Signed)
Bed: ZD66 Expected date:  Expected time:  Means of arrival:  Comments: EMS foley problem

## 2019-01-21 NOTE — ED Notes (Addendum)
Foley cath irrigated with NS without difficulty. No clots present when irrigated. Urine flowing freely and light red in color.

## 2019-01-23 LAB — URINE CULTURE: Culture: >=100000 — AB

## 2019-01-24 ENCOUNTER — Telehealth: Payer: Self-pay | Admitting: *Deleted

## 2019-01-24 NOTE — Telephone Encounter (Signed)
Post ED Visit - Positive Culture Follow-up  Culture report reviewed by antimicrobial stewardship pharmacist: [place] Team []  Elenor Quinones, Pharm.D. []  Heide Guile, Pharm.D., BCPS AQ-ID []  Parks Neptune, Pharm.D., BCPS []  Alycia Rossetti, Pharm.D., BCPS []  [place], Pharm.D., BCPS, AAHIVP []  Legrand Como, Pharm.D., BCPS, AAHIVP []  Salome Arnt, PharmD, BCPS []  Johnnette Gourd, PharmD, BCPS []  Hughes Better, PharmD, BCPS []  Leeroy Cha, PharmD []  Laqueta Linden, PharmD, BCPS []  Albertina Parr, PharmD  [place] Team []  Leodis Sias, PharmD []  Lindell Spar, PharmD []  Royetta Asal, PharmD []  Graylin Shiver, Rph []  Rema Fendt) Glennon Mac, PharmD []  Arlyn Dunning, PharmD [x]  Netta Cedars, PharmD []  Dia Sitter, PharmD []  Leone Haven, PharmD []  Gretta Arab, PharmD []  Theodis Shove, PharmD []  Peggyann Juba, PharmD []  Reuel Boom, PharmD   Positive urine culture Treated with Cephalexin, organism sensitive to the same and no further patient follow-up is required at this time.  Harlon Flor [facility] 01/24/2019, 8:02 AM

## 2019-01-29 DIAGNOSIS — R339 Retention of urine, unspecified: Secondary | ICD-10-CM | POA: Diagnosis not present

## 2019-01-29 DIAGNOSIS — N401 Enlarged prostate with lower urinary tract symptoms: Secondary | ICD-10-CM | POA: Diagnosis not present

## 2019-01-30 DIAGNOSIS — Z0000 Encounter for general adult medical examination without abnormal findings: Secondary | ICD-10-CM | POA: Diagnosis not present

## 2019-02-11 ENCOUNTER — Other Ambulatory Visit: Payer: Self-pay | Admitting: Urology

## 2019-02-15 ENCOUNTER — Emergency Department (HOSPITAL_COMMUNITY)
Admission: EM | Admit: 2019-02-15 | Discharge: 2019-02-15 | Disposition: A | Discharge status: Home / Self Care | Payer: Medicare Other | Attending: Emergency Medicine | Admitting: Emergency Medicine

## 2019-02-15 ENCOUNTER — Other Ambulatory Visit: Payer: Self-pay

## 2019-02-15 ENCOUNTER — Encounter (HOSPITAL_COMMUNITY): Payer: Self-pay

## 2019-02-15 DIAGNOSIS — Y658 Other specified misadventures during surgical and medical care: Secondary | ICD-10-CM | POA: Insufficient documentation

## 2019-02-15 DIAGNOSIS — I110 Hypertensive heart disease with heart failure: Secondary | ICD-10-CM | POA: Diagnosis not present

## 2019-02-15 DIAGNOSIS — Z87891 Personal history of nicotine dependence: Secondary | ICD-10-CM | POA: Diagnosis not present

## 2019-02-15 DIAGNOSIS — I5022 Chronic systolic (congestive) heart failure: Secondary | ICD-10-CM | POA: Insufficient documentation

## 2019-02-15 DIAGNOSIS — Z85828 Personal history of other malignant neoplasm of skin: Secondary | ICD-10-CM | POA: Insufficient documentation

## 2019-02-15 DIAGNOSIS — Z79899 Other long term (current) drug therapy: Secondary | ICD-10-CM | POA: Insufficient documentation

## 2019-02-15 DIAGNOSIS — T83098A Other mechanical complication of other indwelling urethral catheter, initial encounter: Secondary | ICD-10-CM | POA: Insufficient documentation

## 2019-02-15 DIAGNOSIS — R1030 Lower abdominal pain, unspecified: Secondary | ICD-10-CM | POA: Diagnosis not present

## 2019-02-15 DIAGNOSIS — I252 Old myocardial infarction: Secondary | ICD-10-CM | POA: Diagnosis not present

## 2019-02-15 DIAGNOSIS — Z8546 Personal history of malignant neoplasm of prostate: Secondary | ICD-10-CM | POA: Diagnosis not present

## 2019-02-15 DIAGNOSIS — Z7982 Long term (current) use of aspirin: Secondary | ICD-10-CM | POA: Diagnosis not present

## 2019-02-15 DIAGNOSIS — T839XXA Unspecified complication of genitourinary prosthetic device, implant and graft, initial encounter: Secondary | ICD-10-CM

## 2019-02-15 LAB — URINALYSIS, ROUTINE W REFLEX MICROSCOPIC
Bilirubin Urine: NEGATIVE
Glucose, UA: NEGATIVE mg/dL
Ketones, ur: NEGATIVE mg/dL
Nitrite: NEGATIVE
Protein, ur: >=300 mg/dL — AB
RBC / HPF: >50 RBC/hpf — ABNORMAL HIGH (ref 0–5)
Specific Gravity, Urine: 1.019 (ref 1.005–1.030)
WBC, UA: >50 WBC/hpf — ABNORMAL HIGH (ref 0–5)
pH: 7 (ref 5.0–8.0)

## 2019-02-15 MED ORDER — CEFTRIAXONE SODIUM 1 G IJ SOLR
1.0000 g | Freq: Once | INTRAMUSCULAR | Status: AC
  Administered 2019-02-15: 20:00:00 1 g via INTRAMUSCULAR
  Filled 2019-02-15: qty 10

## 2019-02-15 MED ORDER — FLUCONAZOLE 150 MG PO TABS
150.0000 mg | ORAL_TABLET | Freq: Once | ORAL | Status: AC
  Administered 2019-02-15: 21:00:00 150 mg via ORAL
  Filled 2019-02-15: qty 1

## 2019-02-15 MED ORDER — CEPHALEXIN 500 MG PO CAPS: 500.0000 mg | ORAL_CAPSULE | Freq: Two times a day (BID) | ORAL | 0 refills | Status: DC

## 2019-02-15 MED ORDER — STERILE WATER FOR INJECTION IJ SOLN
INTRAMUSCULAR | Status: AC
  Administered 2019-02-15: 10 mL
  Filled 2019-02-15: qty 10

## 2019-02-15 NOTE — ED Triage Notes (Signed)
Patient states that he has a clogged urinary catheter and has not seen any urine since 1500 today.

## 2019-02-15 NOTE — ED Provider Notes (Signed)
[facility] DEPT Provider Note   CSN: [id number] Arrival date & time: 02/15/19  1812    History   Chief Complaint Chief Complaint  Patient presents with   clogged urinary catheter    HPI Ronald Kaiser is a 85 y.o. male with a past medical history of prostate cancer, chronic Foley in place scheduled for TURP, CAD, CHF, hypertension, MI, who presents today for evaluation of no urinary output since 3 PM today.  He reports that this is similar to what happened on April 20 when he was seen.  He says that when he woke up this morning he had to shake his catheter bag as it looked like there was "gooey" material and then after that it was able to flow.  He denies any flank pain.  He reports that he has suprapubic pain consistent with when his catheter was occluded in the past.     HPI  Past Medical History:  Diagnosis Date   AKI (acute kidney injury) ([place]) 11/12/2018   CAD (coronary artery disease)    CHF (congestive heart failure) (H[facility])    Dyspnea    HTN (hypertension)    Hyperlipidemia    Myocardial infarction ([place]) 2000   Obesity    Prostate cancer [facility])     Patient Active Problem List   Diagnosis Date Noted   Acute renal failure ([place]) 11/27/2018   Bladder outlet obstruction 11/27/2018   Hospital discharge follow-up 11/27/2018   Acute kidney injury ([place])    Hypokalemia    Other hydronephrosis    Acute on chronic systolic CHF (congestive heart failure) ([place]) 11/11/2018   AKI (acute kidney injury) ([place]) 11/11/2018   Basal cell carcinoma (BCC) of skin of nose 01/24/2018   Abnormal nuclear cardiac imaging test    Actinic keratoses 01/13/2016   CHF (congestive heart failure) ([place]) 01/13/2016   Encounter for general adult medical examination without abnormal findings 01/13/2016   Male erectile disorder 01/13/2016   Osteoarthritis 01/13/2016   Seborrheic keratoses 01/13/2016   Bruit 11/23/2012   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 Authorizing Provider  aspirin EC 81 MG tablet Take 81 mg by mouth 2 (two) times daily after a meal.    [provider]  cephALEXin (KEFLEX) 500 MG capsule Take 1 capsule (500 mg total) by mouth 2 (two) times daily. 02/15/19   Lorin Glass, [name]  Coenzyme Q10 (CO Q 10) 100 MG CAPS Take 100 mg by mouth daily.     [provider]  metoprolol tartrate (LOPRESSOR) 50 MG tablet Take 1 tablet (50 mg total) by mouth 2 (two) times daily. Patient taking differently: Take 50 mg by mouth 2 (two) times daily after a meal.  07/09/18   Crenshaw, Denice Bors, MD  Multiple Vitamins-Minerals (CENTRUM SILVER ULTRA MENS PO) Take 1 tablet by mouth daily.     [provider]  nitroGLYCERIN (NITROSTAT) 0.4 MG  SL tablet Place 1 tablet (0.4 mg total) under the tongue every 5 (five) minutes as needed. Patient taking differently: Place 0.4 mg under the tongue every 5 (five) minutes as needed for chest pain.  04/17/17   Lelon Perla, MD  potassium chloride SA (K-DUR,KLOR-CON) 20 MEQ tablet Take 1 tablet (20 mEq total) by mouth daily for 7 days. Patient not taking: Reported on 01/21/2019 11/14/18 11/28/18  Lavina Hamman, MD  pravastatin (PRAVACHOL) 20 MG tablet Take 1 tablet (20 mg total) by mouth daily. Patient taking differently: Take 20 mg by mouth daily after supper.  07/09/18   Lelon Perla, MD  tamsulosin (FLOMAX) 0.4 MG  CAPS capsule Take 1 capsule (0.4 mg total) by mouth daily after supper. Patient not taking: Reported on 01/21/2019 11/14/18   Lavina Hamman, MD    Family History Family History  Problem Relation Age of Onset   Hypertension Father     Social History Social History   Tobacco Use   Smoking status: Former Smoker    Last attempt to quit: 1964    Years since quitting: 56.4   Smokeless tobacco: Never Used  Substance Use Topics   Alcohol use: Not Currently   Drug use: Not Currently     Allergies   Bactrim [sulfamethoxazole-trimethoprim]   Review of Systems Review of Systems  Constitutional: Negative for chills and fever.  HENT: Negative for congestion.   Eyes: Negative for visual disturbance.  Respiratory: Negative for cough, choking and shortness of breath.   Cardiovascular: Negative for chest pain.  Gastrointestinal: Positive for abdominal pain (Only suprapubic).  Genitourinary: Positive for difficulty urinating and penile pain (At the tip of his penis). Negative for decreased urine volume, flank pain, hematuria, penile swelling, scrotal swelling, testicular pain and urgency.  Musculoskeletal: Negative for back pain and neck pain.  Neurological: Negative for weakness and headaches.  All other systems reviewed and are negative.    Physical Exam Updated Vital Signs BP (!) 120/59 (BP Location: Right Arm)    Pulse (!) 58    Temp 98.1 F (36.7 C) (Oral)    Resp 15    Ht 6\' 1"  (1.854 m)    Wt 87.6 kg    SpO2 98%    BMI 25.48 kg/m   Physical Exam Vitals signs and nursing note reviewed.  Constitutional:      General: He is not in acute distress.    Appearance: He is well-developed. He is not diaphoretic.  HENT:     Head: Normocephalic and atraumatic.     Mouth/Throat:     Mouth: Mucous membranes are moist.  Eyes:     General: No scleral icterus.       Right eye: No discharge.        Left eye: No discharge.     Conjunctiva/sclera: Conjunctivae normal.  Neck:      Musculoskeletal: Normal range of motion.  Cardiovascular:     Rate and Rhythm: Normal rate and regular rhythm.     Pulses: Normal pulses.     Heart sounds: Normal heart sounds.  Pulmonary:     Effort: Pulmonary effort is normal. No respiratory distress.     Breath sounds: No stridor.  Abdominal:     General: Abdomen is flat. There is no distension.     Tenderness: There is abdominal tenderness (Localized suprapubic).  Musculoskeletal:        General: No deformity.  Skin:    General: Skin is warm and dry.  Neurological:     General: No focal deficit present.     Mental Status: He is alert.     Motor: No abnormal muscle tone.  Psychiatric:        Mood and Affect: Mood normal.        Behavior: Behavior normal.      ED Treatments / Results  Labs (all labs ordered are listed, but only abnormal results are displayed) Labs Reviewed  URINALYSIS, ROUTINE W REFLEX MICROSCOPIC - Abnormal; Notable for the following components:      Result Value   APPearance CLOUDY (*)    Hgb urine dipstick MODERATE (*)    Protein, ur >=300 (*)    Leukocytes,Ua LARGE (*)    RBC / HPF >50 (*)    WBC, UA >50 (*)    Bacteria, UA RARE (*)    Non Squamous Epithelial 0-5 (*)    All other components within normal limits  URINE CULTURE    EKG None  Radiology No results found.  Procedures Procedures (including critical care time)  Medications Ordered in ED Medications  cefTRIAXone (ROCEPHIN) injection 1 g (1 g Intramuscular Given 02/15/19 2026)  sterile water (preservative free) injection (10 mLs  Given 02/15/19 2027)  fluconazole (DIFLUCAN) tablet 150 mg (150 mg Oral Given 02/15/19 2055)     Initial Impression / Assessment and Plan / ED Course  I have reviewed the triage vital signs and the nursing notes.  Pertinent labs & imaging results that were available during my care of the patient were reviewed by me and considered in my medical decision making (see chart for details).  Clinical  Course as of Feb 15 138  Fri Feb 15, 2019  1855 RN was attempting to flush catheter, did not flush.  Order placed for replacement.   [EH]    Clinical Course User Index [EH] Lorin Glass, [name]      Patient presents today for evaluation of a clogged Foley catheter.  He did not have any urine output for approximately 3 hours.  He has had similar in the past requiring a Foley change with resolution.  On exam he has mild suprapubic pain with palpation.  Bladder scan was performed which showed under 200 cc in his bladder, however based on his symptoms and inability of RN to flush the catheter suspect that it is occluded.  Order placed for Foley catheter change which was performed by RN, after which patient had a bout 250 cc of urine out.  UA obtained showing budding yeast, moderate blood, 30 protein large leukocytes with over 50 RBCs and WBCs and rare bacteria.  Was sent for culture, previous cultures were reviewed showing that he was sensitive to antibiotics except for Macrobid.  He is treated with a dose of Rocephin here, along with a dose of Diflucan and given a prescription for Keflex with instructions to follow-up with his urologist.  After replacement of catheter his abdominal pain fully resolved and he requested discharge home.  Return precautions were discussed with patient who states their understanding.  At the time of discharge patient denied any unaddressed complaints or concerns.  Patient is agreeable for discharge home.  This patient was seen as a shared visit with Dr. Darl Householder.   Final Clinical Impressions(s) / ED Diagnoses   Final diagnoses:  Problem with Foley catheter, initial encounter [facility])    ED Discharge Orders         Ordered    cephALEXin (KEFLEX) 500 MG capsule  2  times daily     02/15/19 2046           Lorin Glass, [name] 02/16/19 0142    Drenda Freeze, MD 02/20/19 [phone]

## 2019-02-18 LAB — URINE CULTURE: Culture: >=100000 — AB

## 2019-02-19 DIAGNOSIS — R339 Retention of urine, unspecified: Secondary | ICD-10-CM | POA: Diagnosis not present

## 2019-02-19 DIAGNOSIS — Z8546 Personal history of malignant neoplasm of prostate: Secondary | ICD-10-CM | POA: Diagnosis not present

## 2019-02-19 DIAGNOSIS — N401 Enlarged prostate with lower urinary tract symptoms: Secondary | ICD-10-CM | POA: Diagnosis not present

## 2019-02-22 NOTE — Patient Instructions (Addendum)
YOU ARE REQUIRED TO BE TESTED FOR COVID-19 PRIOR TO YOUR SURGERY . YOUR TEST MUST BE COMPLETED ON Tuesday Feb 26, 2019. TESTING IS LOCATED AT [facility] ENTRANCE FROM 9:00AM - 3:00PM. FAILURE TO COMPLETE TESTING MAY RESULT IN CANCELLATION OF YOUR SURGERY.                 Prudy Feeler    Your procedure is scheduled on: 03-01-2019   Report to [facility] Main  Entrance     Report to admitting at 8:30AM     Call this number if you have problems the morning of surgery [phone]      Remember: Do not eat food or drink liquids :After Midnight. BRUSH YOUR TEETH MORNING OF SURGERY AND RINSE YOUR MOUTH OUT, NO CHEWING GUM CANDY OR MINTS.     Take these medicines the morning of surgery with A SIP OF WATER: METOPROLOL                                 You may not have any metal on your body including hair pins and              piercings  Do not wear jewelry, make-up, lotions, powders or perfumes, deodorant                      Men may shave face and neck.   Do not bring valuables to the hospital. [place].  Contacts, dentures or bridgework may not be worn into surgery.                Please read over the following fact sheets you were given: _____________________________________________________________________             [facility] - Preparing for Surgery Before surgery, you can play an important role.  Because skin is not sterile, your skin needs to be as free of germs as possible.  You can reduce the number of germs on your skin by washing with CHG (chlorahexidine gluconate) soap before surgery.  CHG is an antiseptic cleaner which kills germs and bonds with the skin to continue killing germs even after washing. Please DO NOT use if you have an allergy to CHG or antibacterial soaps.  If your skin becomes reddened/irritated stop using the CHG and inform your nurse when you arrive at Short  Stay. Do not shave (including legs and underarms) for at least 48 hours prior to the first CHG shower.  You may shave your face/neck. Please follow these instructions carefully:  1.  Shower with CHG Soap the night before surgery and the  morning of Surgery.  2.  If you choose to wash your hair, wash your hair first as usual with your  normal  shampoo.  3.  After you shampoo, rinse your hair and body thoroughly to remove the  shampoo.                           4.  Use CHG as you would any other liquid soap.  You can apply chg directly  to the skin and wash  Gently with a scrungie or clean washcloth.  5.  Apply the CHG Soap to your body ONLY FROM THE NECK DOWN.   Do not use on face/ open                           Wound or open sores. Avoid contact with eyes, ears mouth and genitals (private parts).                       Wash face,  Genitals (private parts) with your normal soap.             6.  Wash thoroughly, paying special attention to the area where your surgery  will be performed.  7.  Thoroughly rinse your body with warm water from the neck down.  8.  DO NOT shower/wash with your normal soap after using and rinsing off  the CHG Soap.                9.  Pat yourself dry with a clean towel.            10.  Wear clean pajamas.            11.  Place clean sheets on your bed the night of your first shower and do not  sleep with pets. Day of Surgery : Do not apply any lotions/deodorants the morning of surgery.  Please wear clean clothes to the hospital/surgery center.  FAILURE TO FOLLOW THESE INSTRUCTIONS MAY RESULT IN THE CANCELLATION OF YOUR SURGERY PATIENT SIGNATURE_________________________________  NURSE SIGNATURE__________________________________  ________________________________________________________________________

## 2019-02-22 NOTE — Progress Notes (Signed)
lov cards 11-28-2018 ekg 11-11-2018 ECHO, cxr 11-13-2018 Stress test 2017

## 2019-02-26 ENCOUNTER — Encounter (HOSPITAL_COMMUNITY)
Admission: RE | Admit: 2019-02-26 | Discharge: 2019-02-26 | Disposition: A | Discharge status: Home / Self Care | Payer: Medicare Other | Source: Ambulatory Visit | Attending: Urology | Admitting: Urology

## 2019-02-26 ENCOUNTER — Other Ambulatory Visit: Payer: Self-pay

## 2019-02-26 ENCOUNTER — Other Ambulatory Visit (HOSPITAL_COMMUNITY)
Admission: RE | Admit: 2019-02-26 | Discharge: 2019-02-26 | Disposition: A | Discharge status: Home / Self Care | Payer: Medicare Other | Source: Ambulatory Visit | Attending: Urology | Admitting: Urology

## 2019-02-26 ENCOUNTER — Encounter (HOSPITAL_COMMUNITY): Payer: Self-pay

## 2019-02-26 DIAGNOSIS — Z7982 Long term (current) use of aspirin: Secondary | ICD-10-CM | POA: Diagnosis not present

## 2019-02-26 DIAGNOSIS — C61 Malignant neoplasm of prostate: Secondary | ICD-10-CM | POA: Diagnosis not present

## 2019-02-26 DIAGNOSIS — Z1159 Encounter for screening for other viral diseases: Secondary | ICD-10-CM | POA: Diagnosis not present

## 2019-02-26 DIAGNOSIS — Z79899 Other long term (current) drug therapy: Secondary | ICD-10-CM | POA: Diagnosis not present

## 2019-02-26 DIAGNOSIS — I110 Hypertensive heart disease with heart failure: Secondary | ICD-10-CM | POA: Diagnosis not present

## 2019-02-26 DIAGNOSIS — I2510 Atherosclerotic heart disease of native coronary artery without angina pectoris: Secondary | ICD-10-CM | POA: Diagnosis not present

## 2019-02-26 DIAGNOSIS — R338 Other retention of urine: Secondary | ICD-10-CM | POA: Diagnosis not present

## 2019-02-26 DIAGNOSIS — N401 Enlarged prostate with lower urinary tract symptoms: Secondary | ICD-10-CM | POA: Diagnosis not present

## 2019-02-26 DIAGNOSIS — N3289 Other specified disorders of bladder: Secondary | ICD-10-CM | POA: Diagnosis not present

## 2019-02-26 DIAGNOSIS — Z87891 Personal history of nicotine dependence: Secondary | ICD-10-CM | POA: Diagnosis not present

## 2019-02-26 DIAGNOSIS — Z87440 Personal history of urinary (tract) infections: Secondary | ICD-10-CM | POA: Diagnosis not present

## 2019-02-26 DIAGNOSIS — I509 Heart failure, unspecified: Secondary | ICD-10-CM | POA: Diagnosis not present

## 2019-02-26 DIAGNOSIS — Z955 Presence of coronary angioplasty implant and graft: Secondary | ICD-10-CM | POA: Diagnosis not present

## 2019-02-26 DIAGNOSIS — C7951 Secondary malignant neoplasm of bone: Secondary | ICD-10-CM | POA: Diagnosis not present

## 2019-02-26 DIAGNOSIS — N138 Other obstructive and reflux uropathy: Secondary | ICD-10-CM | POA: Diagnosis not present

## 2019-02-26 DIAGNOSIS — I252 Old myocardial infarction: Secondary | ICD-10-CM | POA: Diagnosis not present

## 2019-02-26 DIAGNOSIS — Z01812 Encounter for preprocedural laboratory examination: Secondary | ICD-10-CM | POA: Insufficient documentation

## 2019-02-26 HISTORY — DX: Presence of other specified devices: Z97.8

## 2019-02-26 LAB — BASIC METABOLIC PANEL
Anion gap: 4 — ABNORMAL LOW (ref 5–15)
BUN: 18 mg/dL (ref 8–23)
CO2: 26 mmol/L (ref 22–32)
Calcium: 8.8 mg/dL — ABNORMAL LOW (ref 8.9–10.3)
Chloride: 109 mmol/L (ref 98–111)
Creatinine, Ser: 0.8 mg/dL (ref 0.61–1.24)
GFR calc Af Amer: >60 mL/min (ref >60)
GFR calc non Af Amer: >60 mL/min (ref >60)
Glucose, Bld: 106 mg/dL — ABNORMAL HIGH (ref 70–99)
Potassium: 4.5 mmol/L (ref 3.5–5.1)
Sodium: 139 mmol/L (ref 135–145)

## 2019-02-26 LAB — CBC
HCT: 38.6 % — ABNORMAL LOW (ref 39.0–52.0)
Hemoglobin: 12.4 g/dL — ABNORMAL LOW (ref 13.0–17.0)
MCH: 31.9 pg (ref 26.0–34.0)
MCHC: 32.1 g/dL (ref 30.0–36.0)
MCV: 99.2 fL (ref 80.0–100.0)
Platelets: 186 10*3/uL (ref 150–400)
RBC: 3.89 MIL/uL — ABNORMAL LOW (ref 4.22–5.81)
RDW: 14 % (ref 11.5–15.5)
WBC: 4.4 10*3/uL (ref 4.0–10.5)
nRBC: 0 % (ref 0.0–0.2)

## 2019-02-26 NOTE — Progress Notes (Signed)
[facility] FOR [facility] TO NOTIFY THAT URINE CULTURE WILL NEED TO BE DONE MORNING OF SURGERY SINCE PAT DEPT DOES NOT CARRY SUPPLIES TO COLLECT URINE SPECIMEN FROM A FOLEY CATHETER

## 2019-02-26 NOTE — Progress Notes (Signed)
SPOKE W/  _ PATIENT      SCREENING SYMPTOMS OF COVID 19:   COUGH--NO   RUNNY NOSE--- NO  SORE THROAT---NO  NASAL CONGESTION----NO  SNEEZING----NO  SHORTNESS OF BREATH---NO  DIFFICULTY BREATHING---NO  TEMP >100.0 -----NO  UNEXPLAINED BODY ACHES------NO  CHILLS -------- NO  HEADACHES ---------NO  LOSS OF SMELL/ TASTE --------NO    HAVE YOU OR ANY FAMILY MEMBER TRAVELLED PAST 14 DAYS OUT OF THE   COUNTY---NO  STATE----NO COUNTRY----NO  HAVE YOU OR ANY FAMILY MEMBER BEEN EXPOSED TO ANYONE WITH COVID 19?    NO

## 2019-02-27 LAB — NOVEL CORONAVIRUS, NAA (HOSP ORDER, SEND-OUT TO REF LAB; TAT 18-24 HRS): SARS-CoV-2, NAA: NOT DETECTED

## 2019-02-27 NOTE — Anesthesia Preprocedure Evaluation (Addendum)
Anesthesia Evaluation  Patient identified by MRN, date of birth, ID band Patient awake    Reviewed: Allergy & Precautions, NPO status , Patient's Chart, lab work & pertinent test results  Airway Mallampati: II  TM Distance: >3 FB Neck ROM: Full    Dental no notable dental hx.    Pulmonary former smoker,    Pulmonary exam normal breath sounds clear to auscultation       Cardiovascular hypertension, Pt. on medications + CAD, + Past MI and + Cardiac Stents  Normal cardiovascular exam Rhythm:Regular Rate:Normal     Neuro/Psych negative neurological ROS  negative psych ROS   GI/Hepatic negative GI ROS, Neg liver ROS,   Endo/Other  negative endocrine ROS  Renal/GU negative Renal ROS  negative genitourinary   Musculoskeletal negative musculoskeletal ROS (+)   Abdominal   Peds negative pediatric ROS (+)  Hematology negative hematology ROS (+)   Anesthesia Other Findings   Reproductive/Obstetrics negative OB ROS                            Anesthesia Physical Anesthesia Plan  ASA: II  Anesthesia Plan: General   Post-op Pain Management:    Induction: Intravenous  PONV Risk Score and Plan: 2 and Ondansetron and Treatment may vary due to age or medical condition  Airway Management Planned: LMA and Oral ETT  Additional Equipment:   Intra-op Plan:   Post-operative Plan: Extubation in OR  Informed Consent: I have reviewed the patients History and Physical, chart, labs and discussed the procedure including the risks, benefits and alternatives for the proposed anesthesia with the patient or authorized representative who has indicated his/her understanding and acceptance.     Dental advisory given  Plan Discussed with: CRNA  Anesthesia Plan Comments: (See PAT note 02/26/2019, Konrad Felix, [name])       Anesthesia Quick Evaluation

## 2019-02-27 NOTE — Progress Notes (Signed)
Anesthesia Chart Review   Case:  [id number] Date/Time:  03/01/19 1015   Procedure:  TRANSURETHRAL RESECTION OF THE PROSTATE (TURP) (N/A ) - 75 MINS   Anesthesia type:  General   Pre-op diagnosis:  URINARY RETENTION   Location:  [place] / [facility] ORS   Surgeon:  Ardis Hughs, MD      DISCUSSION: 85 yo former smoker (quit 10/03/62) with h/o HTN, HLD, CAD (MI 2000), CHF, urinary retention with foley catheter in place scheduled for above procedure 03/01/19 with Dr. Louis Meckel.   Pt last seen by cardiologist, Dr. Kirk Ruths, 05/07/18.  Stable at this visit. Echo 11/13/2018, the left ventricle has mild-moderately reduced systolic function of 00-93%.  Nuclear study May 2017 showed ejection fraction of 42%, inferoseptal ischemia and a prior anterior infarct with peri-infarct ischemia. Cardiac catheterization June 2017 showed a 50% proximal right coronary artery, 45% proximal to distal LAD and 50% circumflex. Ejection fraction 45-50%.  Pt can proceed with planned procedure barring acute status change.  VS: BP (!) 120/59 (BP Location: Left Arm)   Pulse (!) 54   Temp 36.9 C (Oral)   Resp 18   Ht 6\' 1"  (1.854 m)   Wt 90.8 kg   SpO2 98%   BMI 26.40 kg/m   PROVIDERS: Christain Sacramento, MD is PCP   Kirk Ruths, MD is Cardiologist  LABS: Labs reviewed: Acceptable for surgery. (all labs ordered are listed, but only abnormal results are displayed)  Labs Reviewed  BASIC METABOLIC PANEL - Abnormal; Notable for the following components:      Result Value   Glucose, Bld 106 (*)    Calcium 8.8 (*)    Anion gap 4 (*)    All other components within normal limits  CBC - Abnormal; Notable for the following components:   RBC 3.89 (*)    Hemoglobin 12.4 (*)    HCT 38.6 (*)    All other components within normal limits     IMAGES:   EKG: 11/11/2018 Rate 56 bpm Sinus bradycardia with Premature atrial complexes Left axis deviation Right bundle branch block Septal infarct , age  undetermined Abnormal ECG  CV: Echo 11/13/2018  EF 40-45% FINDINGS  Left Ventricle: The left ventricle has mild-moderately reduced systolic function of 81-82%. The cavity size was normal. There is mild concentric left ventricular hypertrophy. Left ventricular diastolic Doppler parameters are consistent with impaired  relaxation (grade I) Normal left ventricular filling pressures There is akinesis of the apical anterior and anteroseptal left ventricular segments. Right Ventricle: The right ventricle has normal systolic function. The cavity was normal. There is no increase in right ventricular wall thickness. Left Atrium: left atrial size was normal Right Atrium: right atrial size was normal Interatrial Septum: No atrial level shunt detected by color flow Doppler. Pericardium: There is no evidence of pericardial effusion. Mitral Valve: The mitral valve is normal in structure. Mitral valve regurgitation is not visualized by color flow Doppler. Tricuspid Valve: The tricuspid valve is normal in structure. Tricuspid valve regurgitation was not visualized by color flow Doppler. Aortic Valve: The aortic valve is tricuspid There is mild thickening and mild calcification of the aortic valve. Aortic valve regurgitation is trivial by color flow Doppler. Pulmonic Valve: The pulmonic valve was not well visualized. The pulmonic valve was grossly normal. Pulmonic valve regurgitation is not visualized by color flow Doppler.  Cardiac Cath 03/09/16  Mild-to-moderate LAD in-stent restenosis without any evidence of high-grade obstruction.  Moderate proximal RCA stenosis greater  than 50%.  There is mild to moderate mid circumflex stenosis.  Low normal LV systolic function with an estimated EF 45-50%. Mildly elevated left ventricular end-diastolic pressures.  Abnormal noninvasive studies, high risk myocardial perfusion imaging without anatomy to explain any evidence of ischemia. Decreased EF and scarring from  prior infarction is the likely explanation for the interpretation.   RECOMMENDATION:   Continue heart failure therapy and risk factor modification.  Baseline noninvasive imaging is now understood and can be used as a basis for future comparison. Past Medical History:  Diagnosis Date  . AKI (acute kidney injury) ([place]) 11/12/2018  . CAD (coronary artery disease)   . CHF (congestive heart failure) ([place])   . Dyspnea   . Foley catheter in place   . HTN (hypertension)   . Hyperlipidemia   . Myocardial infarction ([place]) 2000   REPORTS HE HAS HAD 5 HEART ATTACKS BUT SOME OF THE "WERE NOT SERIOUS"   . Obesity   . Prostate cancer [facility])     Past Surgical History:  Procedure Laterality Date  . CARDIAC CATHETERIZATION N/A 03/09/2016   Procedure: Left Heart Cath and Coronary Angiography;  Surgeon: Belva Crome, MD;  Location: [place] CV LAB;  Service: Cardiovascular;  Laterality: N/A;  . RADIATION TO PROSTATE    . Stenting of LAD  2000  . TONSILLECTOMY      MEDICATIONS: . furosemide (LASIX) 20 MG tablet  . aspirin EC 81 MG tablet  . aspirin-sod bicarb-citric acid (ALKA-SELTZER) 325 MG TBEF tablet  . bismuth subsalicylate (PEPTO BISMOL) 262 MG/15ML suspension  . cephALEXin (KEFLEX) 500 MG capsule  . Coenzyme Q10 (CO Q 10) 100 MG CAPS  . metoprolol tartrate (LOPRESSOR) 50 MG tablet  . Multiple Vitamins-Minerals (CENTRUM SILVER ULTRA MENS PO)  . nitroGLYCERIN (NITROSTAT) 0.4 MG SL tablet  . potassium chloride SA (K-DUR,KLOR-CON) 20 MEQ tablet  . pravastatin (PRAVACHOL) 20 MG tablet  . sodium chloride (OCEAN) 0.65 % SOLN nasal spray  . tamsulosin (FLOMAX) 0.4 MG CAPS capsule   No current facility-administered medications for this encounter.     Maia Plan [facility] Pre-Surgical Testing [phone] 02/27/19 9:01 AM

## 2019-02-28 MED ORDER — GENTAMICIN SULFATE 40 MG/ML IJ SOLN
5.0000 mg/kg | INTRAVENOUS | Status: AC
  Administered 2019-03-01: 450 mg via INTRAVENOUS
  Filled 2019-02-28: qty 11.25

## 2019-02-28 NOTE — Progress Notes (Signed)
Left message for patient to call back with any symptoms of Covid 19    SPOKE W/  _     SCREENING SYMPTOMS OF COVID 19:   COUGH--  RUNNY NOSE---   SORE THROAT---  NASAL CONGESTION----  SNEEZING----  SHORTNESS OF BREATH---  DIFFICULTY BREATHING---  TEMP >100.0 -----  UNEXPLAINED BODY ACHES------  CHILLS --------   HEADACHES ---------  LOSS OF SMELL/ TASTE --------    HAVE YOU OR ANY FAMILY MEMBER TRAVELLED PAST 14 DAYS OUT OF THE   COUNTY--- STATE---- COUNTRY----  HAVE YOU OR ANY FAMILY MEMBER BEEN EXPOSED TO ANYONE WITH COVID 19?

## 2019-03-01 ENCOUNTER — Ambulatory Visit (HOSPITAL_COMMUNITY): Payer: Medicare Other | Admitting: Certified Registered Nurse Anesthetist

## 2019-03-01 ENCOUNTER — Ambulatory Visit (HOSPITAL_COMMUNITY): Payer: Medicare Other | Admitting: Physician Assistant

## 2019-03-01 ENCOUNTER — Encounter (HOSPITAL_COMMUNITY): Payer: Self-pay

## 2019-03-01 ENCOUNTER — Observation Stay (HOSPITAL_COMMUNITY)
Admission: RE | Admit: 2019-03-01 | Discharge: 2019-03-02 | Disposition: A | Discharge status: Home / Self Care | Payer: Medicare Other | Attending: Urology | Admitting: Urology

## 2019-03-01 ENCOUNTER — Encounter (HOSPITAL_COMMUNITY)
Admission: RE | Disposition: A | Discharge status: Home / Self Care | Payer: Self-pay | Source: Home / Self Care | Attending: Urology

## 2019-03-01 ENCOUNTER — Other Ambulatory Visit: Payer: Self-pay

## 2019-03-01 DIAGNOSIS — R339 Retention of urine, unspecified: Secondary | ICD-10-CM | POA: Diagnosis present

## 2019-03-01 DIAGNOSIS — N138 Other obstructive and reflux uropathy: Secondary | ICD-10-CM | POA: Insufficient documentation

## 2019-03-01 DIAGNOSIS — I509 Heart failure, unspecified: Secondary | ICD-10-CM | POA: Diagnosis not present

## 2019-03-01 DIAGNOSIS — N3289 Other specified disorders of bladder: Secondary | ICD-10-CM | POA: Diagnosis not present

## 2019-03-01 DIAGNOSIS — Z1159 Encounter for screening for other viral diseases: Secondary | ICD-10-CM | POA: Diagnosis not present

## 2019-03-01 DIAGNOSIS — Z79899 Other long term (current) drug therapy: Secondary | ICD-10-CM | POA: Diagnosis not present

## 2019-03-01 DIAGNOSIS — C7951 Secondary malignant neoplasm of bone: Secondary | ICD-10-CM | POA: Insufficient documentation

## 2019-03-01 DIAGNOSIS — I252 Old myocardial infarction: Secondary | ICD-10-CM | POA: Diagnosis not present

## 2019-03-01 DIAGNOSIS — Z87440 Personal history of urinary (tract) infections: Secondary | ICD-10-CM | POA: Insufficient documentation

## 2019-03-01 DIAGNOSIS — Z7982 Long term (current) use of aspirin: Secondary | ICD-10-CM | POA: Insufficient documentation

## 2019-03-01 DIAGNOSIS — I2510 Atherosclerotic heart disease of native coronary artery without angina pectoris: Secondary | ICD-10-CM | POA: Insufficient documentation

## 2019-03-01 DIAGNOSIS — C61 Malignant neoplasm of prostate: Secondary | ICD-10-CM | POA: Diagnosis not present

## 2019-03-01 DIAGNOSIS — Z87891 Personal history of nicotine dependence: Secondary | ICD-10-CM | POA: Insufficient documentation

## 2019-03-01 DIAGNOSIS — N401 Enlarged prostate with lower urinary tract symptoms: Principal | ICD-10-CM | POA: Insufficient documentation

## 2019-03-01 DIAGNOSIS — I110 Hypertensive heart disease with heart failure: Secondary | ICD-10-CM | POA: Diagnosis not present

## 2019-03-01 DIAGNOSIS — N320 Bladder-neck obstruction: Secondary | ICD-10-CM | POA: Diagnosis not present

## 2019-03-01 DIAGNOSIS — R338 Other retention of urine: Secondary | ICD-10-CM | POA: Diagnosis not present

## 2019-03-01 DIAGNOSIS — Z955 Presence of coronary angioplasty implant and graft: Secondary | ICD-10-CM | POA: Insufficient documentation

## 2019-03-01 HISTORY — PX: TRANSURETHRAL RESECTION OF PROSTATE: SHX73

## 2019-03-01 LAB — CBC
HCT: 34.5 % — ABNORMAL LOW (ref 39.0–52.0)
Hemoglobin: 11.2 g/dL — ABNORMAL LOW (ref 13.0–17.0)
MCH: 32.2 pg (ref 26.0–34.0)
MCHC: 32.5 g/dL (ref 30.0–36.0)
MCV: 99.1 fL (ref 80.0–100.0)
Platelets: 160 10*3/uL (ref 150–400)
RBC: 3.48 MIL/uL — ABNORMAL LOW (ref 4.22–5.81)
RDW: 14.1 % (ref 11.5–15.5)
WBC: 3.9 10*3/uL — ABNORMAL LOW (ref 4.0–10.5)
nRBC: 0 % (ref 0.0–0.2)

## 2019-03-01 LAB — BASIC METABOLIC PANEL
Anion gap: 5 (ref 5–15)
BUN: 17 mg/dL (ref 8–23)
CO2: 26 mmol/L (ref 22–32)
Calcium: 8.4 mg/dL — ABNORMAL LOW (ref 8.9–10.3)
Chloride: 109 mmol/L (ref 98–111)
Creatinine, Ser: 0.73 mg/dL (ref 0.61–1.24)
GFR calc Af Amer: >60 mL/min (ref >60)
GFR calc non Af Amer: >60 mL/min (ref >60)
Glucose, Bld: 97 mg/dL (ref 70–99)
Potassium: 4.2 mmol/L (ref 3.5–5.1)
Sodium: 140 mmol/L (ref 135–145)

## 2019-03-01 SURGERY — TURP (TRANSURETHRAL RESECTION OF PROSTATE)
Anesthesia: General

## 2019-03-01 MED ORDER — ASPIRIN EC 81 MG PO TBEC
81.0000 mg | DELAYED_RELEASE_TABLET | Freq: Two times a day (BID) | ORAL | Status: DC
  Administered 2019-03-01 – 2019-03-02 (×2): 81 mg via ORAL
  Filled 2019-03-01 (×2): qty 1

## 2019-03-01 MED ORDER — CEPHALEXIN 500 MG PO CAPS
500.0000 mg | ORAL_CAPSULE | Freq: Two times a day (BID) | ORAL | Status: DC
  Administered 2019-03-01 – 2019-03-02 (×3): 500 mg via ORAL
  Filled 2019-03-01 (×3): qty 1

## 2019-03-01 MED ORDER — TRAMADOL HCL 50 MG PO TABS: 50.0000 mg | ORAL_TABLET | Freq: Four times a day (QID) | ORAL | 0 refills | Status: DC | PRN

## 2019-03-01 MED ORDER — ONDANSETRON HCL 4 MG/2ML IJ SOLN
INTRAMUSCULAR | Status: DC | PRN
  Administered 2019-03-01: 4 mg via INTRAVENOUS

## 2019-03-01 MED ORDER — TRAMADOL HCL 50 MG PO TABS: 50.0000 mg | ORAL_TABLET | Freq: Four times a day (QID) | ORAL | Status: DC | PRN

## 2019-03-01 MED ORDER — BELLADONNA ALKALOIDS-OPIUM 16.2-30 MG RE SUPP
RECTAL | Status: AC
  Filled 2019-03-01: qty 1

## 2019-03-01 MED ORDER — SODIUM CHLORIDE 0.9 % IR SOLN
3000.0000 mL | Status: DC
  Administered 2019-03-01: 3000 mL via INTRAVESICAL

## 2019-03-01 MED ORDER — ONDANSETRON HCL 4 MG/2ML IJ SOLN: 4.0000 mg | Freq: Four times a day (QID) | INTRAMUSCULAR | Status: DC | PRN

## 2019-03-01 MED ORDER — LIDOCAINE 2% (20 MG/ML) 5 ML SYRINGE
INTRAMUSCULAR | Status: DC | PRN
  Administered 2019-03-01: 80 mg via INTRAVENOUS

## 2019-03-01 MED ORDER — TAMSULOSIN HCL 0.4 MG PO CAPS: 0.4000 mg | ORAL_CAPSULE | Freq: Every day | ORAL | Status: DC

## 2019-03-01 MED ORDER — LACTATED RINGERS IV SOLN
INTRAVENOUS | Status: DC
  Administered 2019-03-01: 09:00:00 via INTRAVENOUS

## 2019-03-01 MED ORDER — BELLADONNA ALKALOIDS-OPIUM 16.2-60 MG RE SUPP
RECTAL | Status: DC | PRN
  Administered 2019-03-01: 1 via RECTAL

## 2019-03-01 MED ORDER — PROPOFOL 10 MG/ML IV BOLUS
INTRAVENOUS | Status: AC
  Filled 2019-03-01: qty 20

## 2019-03-01 MED ORDER — NITROGLYCERIN 0.4 MG SL SUBL: 0.4000 mg | SUBLINGUAL_TABLET | SUBLINGUAL | Status: DC | PRN

## 2019-03-01 MED ORDER — FENTANYL CITRATE (PF) 100 MCG/2ML IJ SOLN: 25.0000 ug | INTRAMUSCULAR | Status: DC | PRN

## 2019-03-01 MED ORDER — CO Q 10 100 MG PO CAPS: 100.0000 mg | ORAL_CAPSULE | Freq: Every day | ORAL | Status: DC

## 2019-03-01 MED ORDER — MEPERIDINE HCL 50 MG/ML IJ SOLN: 6.2500 mg | INTRAMUSCULAR | Status: DC | PRN

## 2019-03-01 MED ORDER — PROPOFOL 10 MG/ML IV BOLUS
INTRAVENOUS | Status: DC | PRN
  Administered 2019-03-01: 110 mg via INTRAVENOUS

## 2019-03-01 MED ORDER — METOPROLOL TARTRATE 50 MG PO TABS
50.0000 mg | ORAL_TABLET | Freq: Two times a day (BID) | ORAL | Status: DC
  Filled 2019-03-01 (×2): qty 1

## 2019-03-01 MED ORDER — LIDOCAINE 2% (20 MG/ML) 5 ML SYRINGE
INTRAMUSCULAR | Status: AC
  Filled 2019-03-01: qty 5

## 2019-03-01 MED ORDER — FENTANYL CITRATE (PF) 100 MCG/2ML IJ SOLN
INTRAMUSCULAR | Status: DC | PRN
  Administered 2019-03-01: 50 ug via INTRAVENOUS
  Administered 2019-03-01: 25 ug via INTRAVENOUS

## 2019-03-01 MED ORDER — FUROSEMIDE 20 MG PO TABS: 20.0000 mg | ORAL_TABLET | Freq: Every day | ORAL | Status: DC | PRN

## 2019-03-01 MED ORDER — SODIUM CHLORIDE 0.9 % IV SOLN
INTRAVENOUS | Status: DC | PRN
  Administered 2019-03-01: 11:00:00 40 ug/min via INTRAVENOUS

## 2019-03-01 MED ORDER — PRAVASTATIN SODIUM 20 MG PO TABS
20.0000 mg | ORAL_TABLET | Freq: Every day | ORAL | Status: DC
  Administered 2019-03-01: 20 mg via ORAL
  Filled 2019-03-01: qty 1

## 2019-03-01 MED ORDER — FENTANYL CITRATE (PF) 100 MCG/2ML IJ SOLN
INTRAMUSCULAR | Status: AC
  Filled 2019-03-01: qty 2

## 2019-03-01 MED ORDER — PHENYLEPHRINE HCL (PRESSORS) 10 MG/ML IV SOLN
INTRAVENOUS | Status: AC
  Filled 2019-03-01: qty 1

## 2019-03-01 MED ORDER — ACETAMINOPHEN 500 MG PO TABS: 1000.0000 mg | ORAL_TABLET | Freq: Four times a day (QID) | ORAL | Status: DC | PRN

## 2019-03-01 MED ORDER — SODIUM CHLORIDE 0.9 % IR SOLN
Status: DC | PRN
  Administered 2019-03-01: 9000 mL

## 2019-03-01 MED ORDER — METOCLOPRAMIDE HCL 5 MG/ML IJ SOLN: 10.0000 mg | Freq: Once | INTRAMUSCULAR | Status: DC | PRN

## 2019-03-01 MED ORDER — ONDANSETRON HCL 4 MG/2ML IJ SOLN
INTRAMUSCULAR | Status: AC
  Filled 2019-03-01: qty 2

## 2019-03-01 SURGICAL SUPPLY — 17 items
BAG URINE DRAINAGE (UROLOGICAL SUPPLIES) ×3 IMPLANT
BAG URO CATCHER STRL LF (MISCELLANEOUS) ×3 IMPLANT
CATH FOLEY 3WAY 30CC 22FR (CATHETERS) ×3 IMPLANT
ELECT REM PT RETURN 15FT ADLT (MISCELLANEOUS) ×3 IMPLANT
GLOVE BIO SURGEON STRL SZ7.5 (GLOVE) ×3 IMPLANT
GOWN STRL REUS W/ TWL XL LVL3 (GOWN DISPOSABLE) ×1 IMPLANT
GOWN STRL REUS W/TWL XL LVL3 (GOWN DISPOSABLE) ×5 IMPLANT
HOLDER FOLEY CATH W/STRAP (MISCELLANEOUS) ×3 IMPLANT
KIT TURNOVER KIT A (KITS) IMPLANT
LOOP CUT BIPOLAR 24F LRG (ELECTROSURGICAL) ×3 IMPLANT
MANIFOLD NEPTUNE II (INSTRUMENTS) ×3 IMPLANT
PACK CYSTO (CUSTOM PROCEDURE TRAY) ×3 IMPLANT
SYR 30ML LL (SYRINGE) ×3 IMPLANT
SYRINGE IRR TOOMEY STRL 70CC (SYRINGE) ×3 IMPLANT
TUBING CONNECTING 10 (TUBING) ×2 IMPLANT
TUBING CONNECTING 10' (TUBING) ×1
TUBING UROLOGY SET (TUBING) ×3 IMPLANT

## 2019-03-01 NOTE — Transfer of Care (Signed)
Immediate Anesthesia Transfer of Care Note  Patient: Ronald Kaiser  Procedure(s) Performed: TRANSURETHRAL RESECTION OF THE PROSTATE (TURP) (N/A )  Patient Location: PACU  Anesthesia Type:General  Level of Consciousness: awake, alert , oriented and patient cooperative  Airway & Oxygen Therapy: Patient Spontanous Breathing and Patient connected to face mask oxygen  Post-op Assessment: Report given to RN, Post -op Vital signs reviewed and stable and Patient moving all extremities  Post vital signs: Reviewed and stable  Last Vitals:  Vitals Value Taken Time  BP 118/62 03/01/2019 11:39 AM  Temp    Pulse 47 03/01/2019 11:41 AM  Resp 17 03/01/2019 11:41 AM  SpO2 100 % 03/01/2019 11:41 AM  Vitals shown include unvalidated device data.  Last Pain:  Vitals:   03/01/19 0857  TempSrc:   PainSc: 0-No pain         Complications: No apparent anesthesia complications

## 2019-03-01 NOTE — Anesthesia Procedure Notes (Signed)
Procedure Name: LMA Insertion Date/Time: 03/01/2019 10:47 AM Performed by: Montel Clock, CRNA Pre-anesthesia Checklist: Patient identified, Emergency Drugs available, Suction available, Patient being monitored and Timeout performed Patient Re-evaluated:Patient Re-evaluated prior to induction Oxygen Delivery Method: Circle system utilized Preoxygenation: Pre-oxygenation with 100% oxygen Induction Type: IV induction LMA: LMA with gastric port inserted LMA Size: 5.0 Number of attempts: 1 Dental Injury: Teeth and Oropharynx as per pre-operative assessment

## 2019-03-01 NOTE — Anesthesia Postprocedure Evaluation (Signed)
Anesthesia Post Note  Patient: Ronald Kaiser  Procedure(s) Performed: TRANSURETHRAL RESECTION OF THE PROSTATE (TURP) (N/A )     Patient location during evaluation: PACU Anesthesia Type: General Level of consciousness: awake and alert Pain management: pain level controlled Vital Signs Assessment: post-procedure vital signs reviewed and stable Respiratory status: spontaneous breathing, nonlabored ventilation, respiratory function stable and patient connected to nasal cannula oxygen Cardiovascular status: blood pressure returned to baseline and stable Postop Assessment: no apparent nausea or vomiting Anesthetic complications: no    Last Vitals:  Vitals:   03/01/19 1300 03/01/19 1315  BP: (!) 95/56 (!) 101/52  Pulse: (!) 40 (!) 36  Resp: 15 19  Temp:    SpO2: 100% 100%    Last Pain:  Vitals:   03/01/19 1300  TempSrc:   PainSc: 0-No pain                 Montez Hageman

## 2019-03-01 NOTE — Op Note (Signed)
Preoperative diagnosis:  1. Bladder outlet obstruction 2. Prostate cancer 3. Urinary retention  Postoperative diagnosis:  1. same   Procedure:  1. Transurethral resection of prostate  Surgeon: Ardis Hughs, MD   Anesthesia: General   Complications: None   Intraoperative findings: Cystoscopy demonstrated high bladder neck with moderate bladder trabeculations.  OTherwise he had  a normal appearing bladder mucosa without tumor/stones/or abnormalities.  UOs were orthopic.  Prostate demonstrated a large median lobe with lateral lobe obstruction.  EBL: 100cc   Specimens: Prostate chips  Indication: Ronald Kaiser is a 85 y.o. patient with bladder outlet obstruction/urinary retention/obstructive voiding symptoms.  After reviewing the management options for treatment, he elected to proceed with the above surgical procedure(s). We have discussed the potential benefits and risks of the procedure, side effects of the proposed treatment, the likelihood of the patient achieving the goals of the procedure, and any potential problems that might occur during the procedure or recuperation. Informed consent has been obtained.  Description of procedure:  The patient was taken to the operating room and general anesthesia was induced. The patient was placed in the dorsal lithotomy position, prepped and draped in the usual sterile fashion, and preoperative antibiotics were administered. A preoperative time-out was performed.   I then gently passed the 21 French 30 cystoscope into the patient's urethra and up into the bladder under visual guidance. A 360 cystoscopic evaluation was then performed with the above findings.  I then removed the 21 French cystoscopic sheath and replacement with a 26 French resectoscope sheath was passed and using the visual obturator under direct vision. An exchange the obturator for the resectoscope itself and the loop cautery. I then proceeded to create grooves within the  prostate at the 7:00 position extending the defect from the bladder neck at 7:00 down to the prostate apex just lateral to the verumontanum. I took this groove down to the capsule. I then performed a similar maneuver on the patient's left side at the 5:00 position taking the prostate down from the bladder neck to the apex and down the prostatic capsule. At this point I proceeded to resect the patient's right lateral lobe in a systematic fashion moving from the 7:00 position up to approximately 11. The resection was taken down to the prostate capsule. Hemostasis was achieved on this side prior to moving to the patient's left lateral lobe. A similar sequence was performed taking the prostate down from the 5:00 position to approximately the 1:00 position to the level of the prostatic capsule. I then completed the resection of the posterior wall as well as the area between 5:00 and 7:00 along the bladder neck. I was careful not to undermine the bladder neck or resect the inferior. I then did resect the area that was protruding into the prostatic urethra anteriorly.   Once I was satisfied that the prostate had been adequately resected I evacuated the prostate chips using a [place] syringe. I then reintroduced the resectoscope and ensured adequate hemostasis. I then left the bladder full and removed the resectoscope entirely. Exam under anesthesia demonstrated a normal sized prostate with no nodules.  I then placed a 22 French three-way Foley catheter. I irrigated the catheter gently and removed all debris and clots. I then placed the patient on gentle continuous bladder irrigation.  He subsequently extubated and returned to PACU in excellent condition.  Ardis Hughs, MD

## 2019-03-01 NOTE — Interval H&P Note (Signed)
History and Physical Interval Note:  03/01/2019 10:37 AM  Ronald Kaiser  has presented today for surgery, with the diagnosis of URINARY RETENTION.  The various methods of treatment have been discussed with the patient and family. After consideration of risks, benefits and other options for treatment, the patient has consented to  Procedure(s) with comments: [place] (TURP) (N/A) - 75 MINS as a surgical intervention.  The patient's history has been reviewed, patient examined, no change in status, stable for surgery.  I have reviewed the patient's chart and labs.  Questions were answered to the patient's satisfaction.     Ardis Hughs

## 2019-03-01 NOTE — Progress Notes (Signed)
Dr. Louis Meckel rounding on patient in PACU. No blood noted in urine so verbal order was given to turn off CBI.

## 2019-03-01 NOTE — Discharge Instructions (Signed)
Foley Catheter Care, Adult A soft, flexible tube (Foley catheter) has been placed in your bladder. This may be done to temporarily help with urine drainage after an operation or to relieve blockage from an enlarged prostate gland. HOME CARE INSTRUCTIONS  If you are going home with a Foley catheter in place, follow these instructions: Taking Care of the Catheter: Keep the area where the catheter leaves your body clean. Attach the catheter to the leg so there is no tension on the catheter. Keep the drainage bag below the level of the bladder, but keep it OFF the floor. Do not take long soaking baths.  Wash your hands before touching ANYTHING related to the catheter or bag. Using mild soap and warm water on a washcloth: Clean the area closest to the catheter insertion site using a circular motion around the catheter. Clean the catheter itself by wiping AWAY from the insertion site for several inches down the tube. NEVER wipe upward as this could sweep bacteria up into the urethra (tube in your body that normally drains the bladder) and cause infection. Taking Care of the Drainage Bags: Two drainage bags will be taken home: a large overnight drainage bag, and a smaller leg bag which fits underneath clothing. It is okay to wear the overnight bag at any time, but NEVER wear the smaller leg bag at night. Keep the drainage bag well below the level of your bladder. This prevents backflow of urine into the bladder and allows the urine to drain freely. Anchor the tubing to your leg to prevent pulling or tension on the catheter. Use tape or a leg strap provided by the hospital. Empty the drainage bag when it is  to  full. Wash your hands before and after touching the bag. Periodically check the tubing for kinks to make sure there is no pressure on the tubing which could restrict the flow of urine. Changing the Drainage Bags: Cleanse both ends of the clean bag with alcohol before changing. Pinch off the  rubber catheter to avoid urine spillage during the disconnection. Disconnect the dirty bag and connect the clean one. Empty the dirty bag carefully to avoid a urine spill. Attach the new bag to the leg with tape or a leg strap. Cleaning the Drainage Bags: Whenever a drainage bag is disconnected, it must be cleaned quickly so it is ready for the next use. Wash the bag in warm, soapy water. Rinse the bag thoroughly with warm water. Soak the bag for 30 minutes in a solution of white vinegar and water (1 cup vinegar to 1 quart warm water). Rinse with warm water. SEEK MEDICAL CARE IF:  Some pain develops in the kidney (lower back) area. The urine is cloudy or smells bad. There is some blood in the urine. The catheter becomes clogged and/or there is no urine drainage. SEEK IMMEDIATE MEDICAL CARE IF:  You have moderate or severe pain in the kidney region. You start to throw up (vomit). Blood fills the tube. Worsening belly (abdominal) pain develops. You have a fever. MAKE SURE YOU:  Understand these instructions. Will watch your condition. Will get help right away if you are not doing well or get worse.  Call [facility] if you have any questions or concerns: [phone]    

## 2019-03-01 NOTE — H&P (Signed)
Acute urinary retention  HPI: Ronald Kaiser is a 85 year-old male established patient who is here for further eval and management of urinary retention.  12/05/2018: He failed a second TOV last week, continues flomax, has complete abx for Proteus UTi. His catheter stopped draining yesterday afternoon and he has developed painful urgency with leaking around the catheter tubing. Presents this morning as an urgent walk-in appointment.   Interval: The patient presents today for further consultation. His Foley catheter was changed earlier in the week and he she is feeling better. He does have persistent intermittent hematuria, but the catheter is draining and he is feeling significantly better.   The patient had approximately 3 L of urine drained initially on his presentation of retention about 3 and half weeks ago. He has failed several voiding trials since that time. He does have a history of advanced prostate cancer with a rising PSA but negative metastatic evaluation about a year ago. His PSA has been very slowly rising. At this point he is not started androgen deprivation therapy.   01/01/19:  Here for a TOV. This patient's Foley catheter was placed on 12/05/2018, and we are planning for a TURP, which is currently being delayed in light of the corona virus. He has not been taking Flomax. Had a BM this morning. He is extremely trepidacious regarding a TOV. He prefers to be with a catheter, and I explained that regardless, the catheter will need to at least be exchanged today.   01/14/19:  This patient continues with a Foley catheter. He previously took Bactrim x5 doses, for treatment of an infection with Proteus, and then had an allergic reaction. He experienced a skin rash and swelling of his lips. He was then prescribed Augmentin, which he did not start taking secondary to recent reaction to Bactrim. He states his previous symptoms have been resolving and that his rash is much improved. He is feeling much  better, but he presents to the office per the recommendation of his wife regarding previous urinary tract infection and to discuss if he should promptly resume an antibiotic. He also states that his urine has remained clear, and he denies fever, chills, nausea, or vomiting.   01/29/19:  This patient presents to the office with a 16 French catheter in place draining clear, yellow urine. He went to the ER last week on Monday, 4/20, c/o gross hematuria and clot urinary retention. A Foley catheter was placed, and 600 cc of urine drained. BUN/Cr = 26/1.26, GFR= 53. Today, he is comfortable with his Foley catheter, and he is requesting a new leg bag and to schedule a TURP. He is also completing Keflex x1 week, for treatment of infection with Proteus, which was prescribed at the ER. He denies fever, chills, nausea, or vomiting.   02/19/2019: 02/19/2019: Here for catheter exchange prior to his TURP with Dr Louis Meckel next week for chronic high volume urinary retention from an obstructing prostate. He does have a history of advanced prostate cancer with a rising PSA but negative metastatic evaluation about a year ago. His PSA has been very slowly rising. At this point he has not started androgen deprivation therapy. He is no longer taking tamsulosin.   Seen in the ED again on 05/15 for clogged catheter which was replaced. He was given a dose of Diflucan for yeast noted on urinalysis as well as Rocephin. Urine culture resulted positive for Proteus pansensitive except to nitrofurantoin. He was empirically started on Cephalexin BID which he will complete  later this week. Since catheter replaced denies any flank or lower back pain. No abdominal pain or discomfort. He reports no penile pain or discomfort and catheter draining well without gross hematuria or other noted abnormalities. Denies f/c, n/v or other constitutional s/s of infection.   It was first diagnosed approximately 10/12/2018.   He currently has an indwelling  catheter. The catheter was last changed 4 days. The catheter has been in his bladder for 02/15/2019. His urinary retention is being treated with foley catheter.   Prior to this episode, he did not have to bear down/strain to get his stream started. He is having problems with emptying his bladder well.   His current symptoms are not related to undergoing a surgical procedure. The patient has not had prostate surgery in the past.     QOL Score: He would feel terrible if he had to live with his urinary condition the way it is now for the rest of his life.   Calculated QOL Symptom Score: 6    ALLERGIES: Bactrim DS TABS - Hives (Moderate to Severe)    MEDICATIONS: Tamsulosin Hcl 0.4 mg capsule  Adult Low Dose Aspirin Ec 81 mg tablet, delayed release  Cephalexin 500 mg tablet  CoQ-10 100 MG Oral Capsule Oral  Fish Oil CAPS Oral  Furosemide 20 mg tablet  Metoprolol Tartrate 50 mg tablet Oral  Nitrostat 0.4 mg tablet, sublingual  Nitrostat 0.4 MG Sublingual Tablet Sublingual Sublingual  Omega-3 CAPS Oral  Pravastatin Sodium 40 mg tablet Oral     Notes: Nitrostat is PRN, Furosemide is PRN   GU PSH: No GU PSH      PSH Notes: Heart Surgery (2 heart stents), Prostate Surgery   NON-GU PSH: No Non-GU PSH    GU PMH: BPH w/LUTS - 01/29/2019 Urinary Retention, Unspec - 01/29/2019, - 01/14/2019, - 01/01/2019, - 12/07/2018, - 11/30/2018, - 11/23/2018 Urinary Tract Inf, Unspec site - 01/29/2019, - 11/30/2018 History of prostate cancer - 06/18/2018, - 03/20/2017, - 09/19/2016, - 2017, History of prostate cancer, - 2014 Secondary bone metastases - 12/19/2017 Rising PSA after prostate cancer treatment - 09/19/2016 Prostate Cancer, Carcinoma of prostate - 2016      PMH Notes: Prostate Cancer:  2003: initial Gleason grade 8. XRTw/ neo/adjuvant testosterone blockade (Dr. Sharen Counter in [place]). ADT approximately 3 years.   Restaging:  09/20/13 CXR: no evidence of metastatic disease.  09/20/13  Bone scan: Left mid cervical spin and right lower lumbar spine. No other areas of concern.  09/24/13 C spine & Lumbar spine films: no evidence of metastatic disease.   PSA (testosterone):  16.6 on 03/19/16  15.77 on 09/14/15  18.51 on 9/12/1  15.57 on 03/10/15  14.31 12/01/14  11.07 09/02/14  10.14 05/20/14  8.6 02/11/14  8.75 (162) 10/11/13  10.66 08/16/13  4.31 04/24/12  1.58 03/01/10   doubling time:  2/16: 17.5 months   PMH: CHF, CAD (stents)    NON-GU PMH: Encounter for general adult medical examination without abnormal findings, Encounter for preventive health examination - 2016 Myocardial Infarction, History of myocardial infarction - 2014 Personal history of other diseases of the circulatory system, History of cardiac murmur - 2014, History of cardiac disorder, - 2014    FAMILY HISTORY: Alzheimer's Disease - Runs In Family Death of family member - Runs In Family renal failure - Runs In Family   SOCIAL HISTORY: Marital Status: Married Preferred Language: English; Race: Declined to Specify Current Smoking Status: Patient does not smoke anymore.  Does  not use smokeless tobacco. Social Drinker.  Does not use drugs. Drinks 1 caffeinated drink per day.     Notes: Number of children, Former smoker, Caffeine use, Alcohol use, Retired, Married   REVIEW OF SYSTEMS:    GU Review Male:   Patient denies frequent urination, hard to postpone urination, burning/ pain with urination, get up at night to urinate, leakage of urine, stream starts and stops, trouble starting your stream, have to strain to urinate , erection problems, and penile pain.  Gastrointestinal (Upper):   Patient denies nausea, vomiting, and indigestion/ heartburn.  Gastrointestinal (Lower):   Patient denies diarrhea and constipation.  Constitutional:   Patient denies fever, night sweats, weight loss, and fatigue.  Skin:   Patient denies skin rash/ lesion and itching.  Eyes:   Patient denies blurred vision and double  vision.  Ears/ Nose/ Throat:   Patient denies sore throat and sinus problems.  Hematologic/Lymphatic:   Patient denies swollen glands and easy bruising.  Cardiovascular:   Patient denies leg swelling and chest pains.  Respiratory:   Patient denies cough and shortness of breath.  Endocrine:   Patient denies excessive thirst.  Musculoskeletal:   Patient denies back pain and joint pain.  Neurological:   Patient denies headaches and dizziness.  Psychologic:   Patient denies depression and anxiety.   Notes: New foley Sat night    VITAL SIGNS:      02/19/2019 09:50 AM  BP 128/74 mmHg  Heart Rate 50 /min  Temperature 97.8 F / 36.5 C   GU PHYSICAL EXAMINATION:    Scrotum: No lesions. No edema. No cysts. No warts.  Epididymides: Right: no spermatocele, no masses, no cysts, no tenderness, no induration, no enlargement. Left: no spermatocele, no masses, no cysts, no tenderness, no induration, no enlargement.  Testes: No tenderness, no swelling, no enlargement left testes. No tenderness, no swelling, no enlargement right testes. Normal location left testes. Normal location right testes. No mass, no cyst, no varicocele, no hydrocele left testes. No mass, no cyst, no varicocele, no hydrocele right testes.  Urethral Meatus: Normal size. No lesion, no wart, no discharge, no polyp. Normal location.  Penis: Penile foley catheter present draining clear, yellow urine.. Circumcised, no foreskin warts, no cracks. No dorsal peyronie's plaques, no left corporal peyronie's plaques, no right corporal peyronie's plaques, no scarring, no shaft warts. No balanitis, no meatal stenosis.    MULTI-SYSTEM PHYSICAL EXAMINATION:    Constitutional: Well-nourished. No physical deformities. Normally developed. Good grooming.  Neck: Neck symmetrical, not swollen. Normal tracheal position.  Respiratory: Normal breath sounds. No labored breathing, no use of accessory muscles.   Cardiovascular: Regular rate and rhythm. No  murmur, no gallop. Normal temperature, normal extremity pulses, no swelling, no varicosities.   Neurologic / Psychiatric: Oriented to time, oriented to place, oriented to person. No depression, no anxiety, no agitation.  Gastrointestinal: No mass, no tenderness, no rigidity, non obese abdomen.  Musculoskeletal: Normal gait and station of head and neck.     PAST DATA REVIEWED:  Source Of History:  Patient, Medical Record Summary  Records Review:   Previous Hospital Records, Previous Patient Records  Urine Test Review:   Urine Culture   06/13/18 03/27/18 12/12/17 08/29/17 03/13/17 09/14/16 03/14/16 12/22/15  PSA  Total PSA 21.10 ng/mL 20.80 ng/mL 21.60 ng/mL 20.20 ng/mL 17.10 ng/dl 16.70 ng/dl 16.60  18.67     11/11/13  Hormones  Testosterone, Total 162     PROCEDURES: None   ASSESSMENT:  ICD-10 Details  1 GU:   Urinary Retention, Unspec - R33.9   2   BPH w/LUTS - N40.1   3   History of prostate cancer - Z85.46    PLAN:           Schedule Return Visit/Planned Activity: Keep Scheduled Appointment - Schedule Surgery          Document Letter(s):  Created for Patient: Clinical Summary         Notes:   With recent ED catheter replacement, I'll forgo that today. It is draining appropriately. All questions answered about upcoming procedure to the best of my ability and understanding expressed by the patient. The expected postoperative course including expected and reportable increase in lower urinary tract symptoms and associated pain/discomfort, fever/chills. I'll discuss with his urologist about continuing his abx or changing it prior to his surgery on 05/29.

## 2019-03-02 ENCOUNTER — Encounter (HOSPITAL_COMMUNITY): Payer: Self-pay | Admitting: Urology

## 2019-03-02 DIAGNOSIS — Z1159 Encounter for screening for other viral diseases: Secondary | ICD-10-CM | POA: Diagnosis not present

## 2019-03-02 DIAGNOSIS — N3289 Other specified disorders of bladder: Secondary | ICD-10-CM | POA: Diagnosis not present

## 2019-03-02 DIAGNOSIS — N138 Other obstructive and reflux uropathy: Secondary | ICD-10-CM | POA: Diagnosis not present

## 2019-03-02 DIAGNOSIS — C7951 Secondary malignant neoplasm of bone: Secondary | ICD-10-CM | POA: Diagnosis not present

## 2019-03-02 DIAGNOSIS — C61 Malignant neoplasm of prostate: Secondary | ICD-10-CM | POA: Diagnosis not present

## 2019-03-02 DIAGNOSIS — N401 Enlarged prostate with lower urinary tract symptoms: Secondary | ICD-10-CM | POA: Diagnosis not present

## 2019-03-02 LAB — URINE CULTURE: Culture: NO GROWTH

## 2019-03-02 NOTE — Plan of Care (Signed)

## 2019-03-02 NOTE — Plan of Care (Signed)
Problem: Education: Goal: Knowledge of General Education information will improve Description Including pain rating scale, medication(s)/side effects and non-pharmacologic comfort measures 03/02/2019 0116 by Nelson Chimes, RN Outcome: Progressing 03/02/2019 0115 by Nelson Chimes, RN Outcome: Progressing   Problem: Health Behavior/Discharge Planning: Goal: Ability to manage health-related needs will improve 03/02/2019 0116 by Nelson Chimes, RN Outcome: Progressing 03/02/2019 0115 by Nelson Chimes, RN Outcome: Progressing   Problem: Clinical Measurements: Goal: Ability to maintain clinical measurements within normal limits will improve 03/02/2019 0116 by Nelson Chimes, RN Outcome: Progressing 03/02/2019 0115 by Nelson Chimes, RN Outcome: Progressing Goal: Will remain free from infection 03/02/2019 0116 by Nelson Chimes, RN Outcome: Progressing 03/02/2019 0115 by Nelson Chimes, RN Outcome: Progressing Goal: Diagnostic test results will improve 03/02/2019 0116 by Nelson Chimes, RN Outcome: Progressing 03/02/2019 0115 by Nelson Chimes, RN Outcome: Progressing Goal: Respiratory complications will improve 03/02/2019 0116 by Nelson Chimes, RN Outcome: Progressing 03/02/2019 0115 by Nelson Chimes, RN Outcome: Progressing Goal: Cardiovascular complication will be avoided 03/02/2019 0116 by Nelson Chimes, RN Outcome: Progressing 03/02/2019 0115 by Nelson Chimes, RN Outcome: Progressing   Problem: Activity: Goal: Risk for activity intolerance will decrease 03/02/2019 0116 by Nelson Chimes, RN Outcome: Progressing 03/02/2019 0115 by Nelson Chimes, RN Outcome: Progressing   Problem: Nutrition: Goal: Adequate nutrition will be maintained 03/02/2019 0116 by Nelson Chimes, RN Outcome: Progressing 03/02/2019 0115 by Nelson Chimes, RN Outcome: Progressing   Problem: Coping: Goal: Level of anxiety will decrease 03/02/2019 0116 by Nelson Chimes, RN Outcome: Progressing 03/02/2019 0115 by Nelson Chimes, RN Outcome: Progressing   Problem: Elimination: Goal: Will not experience complications related to bowel motility 03/02/2019 0116 by Nelson Chimes, RN Outcome: Progressing 03/02/2019 0115 by Nelson Chimes, RN Outcome: Progressing Goal: Will not experience complications related to urinary retention 03/02/2019 0116 by Nelson Chimes, RN Outcome: Progressing 03/02/2019 0115 by Nelson Chimes, RN Outcome: Progressing   Problem: Pain Managment: Goal: General experience of comfort will improve 03/02/2019 0116 by Nelson Chimes, RN Outcome: Progressing 03/02/2019 0115 by Nelson Chimes, RN Outcome: Progressing   Problem: Safety: Goal: Ability to remain free from injury will improve 03/02/2019 0116 by Nelson Chimes, RN Outcome: Progressing 03/02/2019 0115 by Nelson Chimes, RN Outcome: Progressing   Problem: Skin Integrity: Goal: Risk for impaired skin integrity will decrease 03/02/2019 0116 by Nelson Chimes, RN Outcome: Progressing 03/02/2019 0115 by Nelson Chimes, RN Outcome: Progressing   Problem: Education: Goal: Knowledge of General Education information will improve Description Including pain rating scale, medication(s)/side effects and non-pharmacologic comfort measures 03/02/2019 0116 by Nelson Chimes, RN Outcome: Progressing 03/02/2019 0115 by Nelson Chimes, RN Outcome: Progressing   Problem: Health Behavior/Discharge Planning: Goal: Ability to manage health-related needs will improve 03/02/2019 0116 by Nelson Chimes, RN Outcome: Progressing 03/02/2019 0115 by Nelson Chimes, RN Outcome: Progressing   Problem: Clinical Measurements: Goal: Ability to maintain clinical measurements within normal limits will improve 03/02/2019 0116 by Nelson Chimes, RN Outcome: Progressing 03/02/2019 0115 by Nelson Chimes, RN Outcome: Progressing Goal: Will remain free from  infection 03/02/2019 0116 by Nelson Chimes, RN Outcome: Progressing 03/02/2019 0115 by Nelson Chimes, RN Outcome: Progressing Goal: Diagnostic test results will improve 03/02/2019 0116 by Nelson Chimes, RN Outcome: Progressing 03/02/2019 0115 by Nelson Chimes, RN Outcome: Progressing Goal: Respiratory complications will improve 03/02/2019 0116 by  Nelson Chimes, RN Outcome: Progressing 03/02/2019 0115 by Nelson Chimes, RN Outcome: Progressing Goal: Cardiovascular complication will be avoided 03/02/2019 0116 by Nelson Chimes, RN Outcome: Progressing 03/02/2019 0115 by Nelson Chimes, RN Outcome: Progressing   Problem: Activity: Goal: Risk for activity intolerance will decrease 03/02/2019 0116 by Nelson Chimes, RN Outcome: Progressing 03/02/2019 0115 by Nelson Chimes, RN Outcome: Progressing   Problem: Nutrition: Goal: Adequate nutrition will be maintained 03/02/2019 0116 by Nelson Chimes, RN Outcome: Progressing 03/02/2019 0115 by Nelson Chimes, RN Outcome: Progressing   Problem: Coping: Goal: Level of anxiety will decrease 03/02/2019 0116 by Nelson Chimes, RN Outcome: Progressing 03/02/2019 0115 by Nelson Chimes, RN Outcome: Progressing   Problem: Elimination: Goal: Will not experience complications related to bowel motility 03/02/2019 0116 by Nelson Chimes, RN Outcome: Progressing 03/02/2019 0115 by Nelson Chimes, RN Outcome: Progressing Goal: Will not experience complications related to urinary retention 03/02/2019 0116 by Nelson Chimes, RN Outcome: Progressing 03/02/2019 0115 by Nelson Chimes, RN Outcome: Progressing   Problem: Pain Managment: Goal: General experience of comfort will improve 03/02/2019 0116 by Nelson Chimes, RN Outcome: Progressing 03/02/2019 0115 by Nelson Chimes, RN Outcome: Progressing   Problem: Safety: Goal: Ability to remain free from injury will improve 03/02/2019 0116 by Nelson Chimes,  RN Outcome: Progressing 03/02/2019 0115 by Nelson Chimes, RN Outcome: Progressing   Problem: Skin Integrity: Goal: Risk for impaired skin integrity will decrease 03/02/2019 0116 by Nelson Chimes, RN Outcome: Progressing 03/02/2019 0115 by Nelson Chimes, RN Outcome: Progressing

## 2019-03-02 NOTE — Discharge Summary (Signed)
Date of admission: 03/01/2019  Date of discharge: 03/02/2019  Admission diagnosis: History of prostate cancer, urinary retention and recurrent UTIs  Discharge diagnosis: Same  Procedures: Channel TURP  History and Physical: For full details, please see admission history and physical. Briefly, Ronald Kaiser is a 85 y.o. year old patient with a history of prostate cancer, urinary retention and recurrent urinary tract infections.  The patient underwent a channel TURP on 03/01/2019 with Dr. Louis Meckel secondary to the aforementioned diagnoses.  Hospital Course: The patient was monitored on the floor postoperatively.  His Foley catheter was draining clear yellow urine the day after surgery and the patient had no complaints of pain or urinary discomfort.  He is tolerating a regular diet and ambulating without difficulty.  Physical Exam:  General: Alert and oriented CV: RRR, palpable distal pulses Lungs: CTAB, equal chest rise Abdomen: Soft, NTND, no rebound or guarding GU: Foley catheter in place and draining clear-yellow urine Ext: NT, No erythema  Laboratory values:  Recent Labs    03/01/19 1307  HGB 11.2*  HCT 34.5*   Recent Labs    03/01/19 1307  CREATININE 0.73    Disposition: Home  Discharge instruction: The patient was instructed to be ambulatory but told to refrain from heavy lifting, strenuous activity, or driving.  Discharge medications:  Allergies as of 03/02/2019      Reactions   Bactrim [sulfamethoxazole-trimethoprim] Hives, Shortness Of Breath, Swelling      Medication List    TAKE these medications   aspirin EC 81 MG tablet Take 81 mg by mouth 2 (two) times daily after a meal.   aspirin-sod bicarb-citric acid 325 MG Tbef tablet Commonly known as:  ALKA-SELTZER Take 650 mg by mouth daily as needed (indigestion).   bismuth subsalicylate 824 MP/53IR suspension Commonly known as:  PEPTO BISMOL Take 30 mLs by mouth every 6 (six) hours as needed for indigestion or  diarrhea or loose stools.   CENTRUM SILVER ULTRA MENS PO Take 1 tablet by mouth daily.   cephALEXin 500 MG capsule Commonly known as:  KEFLEX Take 1 capsule (500 mg total) by mouth 2 (two) times daily.   Co Q 10 100 MG Caps Take 100 mg by mouth daily.   furosemide 20 MG tablet Commonly known as:  LASIX Take 20 mg by mouth as needed for fluid or edema.   metoprolol tartrate 50 MG tablet Commonly known as:  LOPRESSOR Take 1 tablet (50 mg total) by mouth 2 (two) times daily. What changed:  when to take this   nitroGLYCERIN 0.4 MG SL tablet Commonly known as:  NITROSTAT Place 1 tablet (0.4 mg total) under the tongue every 5 (five) minutes as needed. What changed:  reasons to take this   potassium chloride SA 20 MEQ tablet Commonly known as:  K-DUR Take 1 tablet (20 mEq total) by mouth daily for 7 days.   pravastatin 20 MG tablet Commonly known as:  PRAVACHOL Take 1 tablet (20 mg total) by mouth daily. What changed:  when to take this   sodium chloride 0.65 % Soln nasal spray Commonly known as:  OCEAN Place 1 spray into both nostrils as needed for congestion.   tamsulosin 0.4 MG Caps capsule Commonly known as:  FLOMAX Take 1 capsule (0.4 mg total) by mouth daily after supper.   traMADol 50 MG tablet Commonly known as:  Ultram Take 1-2 tablets (50-100 mg total) by mouth every 6 (six) hours as needed for moderate pain.  Followup:  Follow-up Information    [facility]. Schedule an appointment as soon as possible for a visit on 03/04/2019.   Why:  Catheter removal Contact information: [place] [phone]

## 2019-03-04 DIAGNOSIS — R339 Retention of urine, unspecified: Secondary | ICD-10-CM | POA: Diagnosis not present

## 2019-03-19 DIAGNOSIS — R339 Retention of urine, unspecified: Secondary | ICD-10-CM | POA: Diagnosis not present

## 2019-04-15 ENCOUNTER — Other Ambulatory Visit: Payer: Self-pay | Admitting: Cardiology

## 2019-04-15 DIAGNOSIS — I10 Essential (primary) hypertension: Secondary | ICD-10-CM

## 2019-04-15 DIAGNOSIS — I2510 Atherosclerotic heart disease of native coronary artery without angina pectoris: Secondary | ICD-10-CM

## 2019-04-15 DIAGNOSIS — E785 Hyperlipidemia, unspecified: Secondary | ICD-10-CM

## 2019-06-12 DIAGNOSIS — Z8546 Personal history of malignant neoplasm of prostate: Secondary | ICD-10-CM | POA: Diagnosis not present

## 2019-06-17 DIAGNOSIS — C61 Malignant neoplasm of prostate: Secondary | ICD-10-CM | POA: Diagnosis not present

## 2019-06-27 NOTE — Progress Notes (Signed)
HPI: FU coronary artery disease. Patient had a myocardial infarction in July of 2000. He was treated with TPA and had stenting of his LAD and PTCA of his second diagonal. Abdominal ultrasound in April of 2014 showed no aneurysm. Nuclear study May 2017 showed ejection fraction of 42%, inferoseptal ischemia and a prior anterior infarct with peri-infarct ischemia. Cardiac catheterization June 2017 showed a 50% proximal right coronary artery, 45% proximal to distal LAD and 50% circumflex. Ejection fraction 45-50%.  Most recent echocardiogram February 2020 showed ejection fraction 40 to 45%, mild left ventricular hypertrophy, grade 1 diastolic dysfunction.  Patient admitted February 2020 with volume excess.  He had an episode of atrial fibrillation as well but was not anticoagulated.  Since last seen, the patient denies any dyspnea on exertion, orthopnea, PND, pedal edema, palpitations, syncope or chest pain.   Current Outpatient Medications  Medication Sig Dispense Refill  . aspirin EC 81 MG tablet Take 81 mg by mouth 2 (two) times daily after a meal.    . Coenzyme Q10 (CO Q 10) 100 MG CAPS Take 100 mg by mouth daily.     . furosemide (LASIX) 20 MG tablet Take 20 mg by mouth as needed for fluid or edema.    . metoprolol tartrate (LOPRESSOR) 50 MG tablet Take 1 tablet (50 mg total) by mouth 2 (two) times daily. Keep September Appointment 180 tablet 0  . Multiple Vitamins-Minerals (CENTRUM SILVER ULTRA MENS PO) Take 1 tablet by mouth daily.     . nitroGLYCERIN (NITROSTAT) 0.4 MG SL tablet Place 1 tablet (0.4 mg total) under the tongue every 5 (five) minutes as needed. (Patient taking differently: Place 0.4 mg under the tongue every 5 (five) minutes as needed for chest pain. ) 25 tablet 12  . pravastatin (PRAVACHOL) 20 MG tablet Take 1 tablet (20 mg total) by mouth daily. Keep September Appointment 90 tablet 0   No current facility-administered medications for this visit.      Past Medical  History:  Diagnosis Date  . AKI (acute kidney injury) ([place]) 11/12/2018  . CAD (coronary artery disease)   . CHF (congestive heart failure) ([place])   . Dyspnea   . Foley catheter in place   . HTN (hypertension)   . Hyperlipidemia   . Myocardial infarction ([place]) 2000   REPORTS HE HAS HAD 5 HEART ATTACKS BUT SOME OF THE "WERE NOT SERIOUS"   . Obesity   . Prostate cancer [facility])     Past Surgical History:  Procedure Laterality Date  . CARDIAC CATHETERIZATION N/A 03/09/2016   Procedure: Left Heart Cath and Coronary Angiography;  Surgeon: Belva Crome, MD;  Location: [facility];  Service: Cardiovascular;  Laterality: N/A;  . RADIATION TO PROSTATE    . Stenting of LAD  2000  . TONSILLECTOMY    . TRANSURETHRAL RESECTION OF PROSTATE N/A 03/01/2019   Procedure: TRANSURETHRAL RESECTION OF THE PROSTATE (TURP);  Surgeon: Ardis Hughs, MD;  Location: [facility];  Service: Urology;  Laterality: N/A;  17 MINS    Social History   Socioeconomic History  . Marital status: Married    Spouse name: Not on file  . Number of children: Not on file  . Years of education: Not on file  . Highest education level: Not on file  Occupational History  . Not on file  Social Needs  . Financial resource strain: Not on file  . Food insecurity    Worry: Not on file  Inability: Not on file  . Transportation needs    Medical: Not on file    Non-medical: Not on file  Tobacco Use  . Smoking status: Former Smoker    Quit date: 1964    Years since quitting: 56.7  . Smokeless tobacco: Never Used  Substance and Sexual Activity  . Alcohol use: Yes    Comment: OCC BEER AND WINE   . Drug use: Not Currently  . Sexual activity: Not on file  Lifestyle  . Physical activity    Days per week: Not on file    Minutes per session: Not on file  . Stress: Not on file  Relationships  . Social Herbalist on phone: Not on file    Gets together: Not on file    Attends religious service: Not on file     Active member of club or organization: Not on file    Attends meetings of clubs or organizations: Not on file    Relationship status: Not on file  . Intimate partner violence    Fear of current or ex partner: Not on file    Emotionally abused: Not on file    Physically abused: Not on file    Forced sexual activity: Not on file  Other Topics Concern  . Not on file  Social History Narrative  . Not on file    Family History  Problem Relation Age of Onset  . Hypertension Father     ROS: no fevers or chills, productive cough, hemoptysis, dysphasia, odynophagia, melena, hematochezia, dysuria, hematuria, rash, seizure activity, orthopnea, PND, pedal edema, claudication. Remaining systems are negative.  Physical Exam: Well-developed well-nourished in no acute distress.  Skin is warm and dry.  HEENT is normal.  Neck is supple.  Chest is clear to auscultation with normal expansion.  Cardiovascular exam is regular rate and rhythm.  Abdominal exam nontender or distended. No masses palpated. Extremities show no edema. neuro grossly intact  A/P  1 coronary artery disease-patient denies recurrent chest pain.  Continue medical therapy with aspirin and statin.  2 ischemic cardiomyopathy-continue beta-blocker.  His ACE inhibitor was held back in February because of renal insufficiency related to his prostate.  His last creatinine was improving.  Resume lisinopril 10 mg daily.  Check potassium and renal function in 1 week.  3 hypertension-patient's blood pressure is controlled.  Add ACEI for ICM.  4 hyperlipidemia-continue statin.  Note he did not tolerate high-dose statin therapy previously.  We will check lipids and liver as well.  5 paroxysmal atrial fibrillation-patient is in sinus rhythm on examination today.  I am concerned about the potential for asymptomatic bouts of atrial fibrillation and the risk of embolic event.  I recommended apixaban today.  However he would prefer to  continue with present medications for now as he feels well.  He understands the higher risk of CVA if he is having recurrent atrial fibrillation.  Kirk Ruths, MD

## 2019-07-01 ENCOUNTER — Encounter: Payer: Self-pay | Admitting: Cardiology

## 2019-07-01 ENCOUNTER — Ambulatory Visit (INDEPENDENT_AMBULATORY_CARE_PROVIDER_SITE_OTHER): Payer: Medicare Other | Admitting: Cardiology

## 2019-07-01 ENCOUNTER — Other Ambulatory Visit: Payer: Self-pay

## 2019-07-01 VITALS — BP 124/58 | HR 50 | Temp 95.0°F | Ht 72.0 in | Wt 202.0 lb

## 2019-07-01 DIAGNOSIS — I10 Essential (primary) hypertension: Secondary | ICD-10-CM

## 2019-07-01 DIAGNOSIS — I2510 Atherosclerotic heart disease of native coronary artery without angina pectoris: Secondary | ICD-10-CM

## 2019-07-01 DIAGNOSIS — I480 Paroxysmal atrial fibrillation: Secondary | ICD-10-CM

## 2019-07-01 DIAGNOSIS — E7800 Pure hypercholesterolemia, unspecified: Secondary | ICD-10-CM

## 2019-07-01 MED ORDER — LISINOPRIL 10 MG PO TABS: 10.0000 mg | ORAL_TABLET | Freq: Every day | ORAL | 3 refills | Status: DC

## 2019-07-01 NOTE — Patient Instructions (Signed)
Medication Instructions:  START LISINOPRIL 10 MG ONCE DAILY  If you need a refill on your cardiac medications before your next appointment, please call your pharmacy.   Lab work: Your physician recommends that you return for lab work in: [place]  If you have labs (blood work) drawn today and your tests are completely normal, you will receive your results only by: Marland Kitchen MyChart Message (if you have MyChart) OR . A paper copy in the mail If you have any lab test that is abnormal or we need to change your treatment, we will call you to review the results.  Follow-Up: At [facility], you and your health needs are our priority.  As part of our continuing mission to provide you with exceptional heart care, we have created designated Provider Care Teams.  These Care Teams include your primary Cardiologist (physician) and Advanced Practice Providers (APPs -  Physician Assistants and Nurse Practitioners) who all work together to provide you with the care you need, when you need it. You will need a follow up appointment in 6 months.  Please call our office 2 months in advance to schedule this appointment.  You may see Kirk Ruths, MD or one of the following Advanced Practice Providers on your designated Care Team:   Kerin Ransom, [name] Roby Lofts, [place] . Sande Rives, [name]

## 2019-07-02 DIAGNOSIS — Z23 Encounter for immunization: Secondary | ICD-10-CM | POA: Diagnosis not present

## 2019-07-09 DIAGNOSIS — I2510 Atherosclerotic heart disease of native coronary artery without angina pectoris: Secondary | ICD-10-CM | POA: Diagnosis not present

## 2019-07-09 LAB — BASIC METABOLIC PANEL
BUN/Creatinine Ratio: 25 — ABNORMAL HIGH (ref 10–24)
BUN: 23 mg/dL (ref 8–27)
CO2: 23 mmol/L (ref 20–29)
Calcium: 9.4 mg/dL (ref 8.6–10.2)
Chloride: 103 mmol/L (ref 96–106)
Creatinine, Ser: 0.93 mg/dL (ref 0.76–1.27)
GFR calc Af Amer: 88 mL/min/{1.73_m2} (ref >59)
GFR calc non Af Amer: 76 mL/min/{1.73_m2} (ref >59)
Glucose: 74 mg/dL (ref 65–99)
Potassium: 4.5 mmol/L (ref 3.5–5.2)
Sodium: 139 mmol/L (ref 134–144)

## 2019-07-15 ENCOUNTER — Other Ambulatory Visit: Payer: Self-pay | Admitting: Cardiology

## 2019-07-15 DIAGNOSIS — I10 Essential (primary) hypertension: Secondary | ICD-10-CM

## 2019-07-15 DIAGNOSIS — I2510 Atherosclerotic heart disease of native coronary artery without angina pectoris: Secondary | ICD-10-CM

## 2019-07-15 DIAGNOSIS — E785 Hyperlipidemia, unspecified: Secondary | ICD-10-CM

## 2019-10-11 ENCOUNTER — Other Ambulatory Visit: Payer: Self-pay | Admitting: Cardiology

## 2019-10-11 DIAGNOSIS — E785 Hyperlipidemia, unspecified: Secondary | ICD-10-CM

## 2019-10-11 DIAGNOSIS — I10 Essential (primary) hypertension: Secondary | ICD-10-CM

## 2019-10-11 DIAGNOSIS — I2510 Atherosclerotic heart disease of native coronary artery without angina pectoris: Secondary | ICD-10-CM

## 2019-12-05 DIAGNOSIS — Z23 Encounter for immunization: Secondary | ICD-10-CM | POA: Diagnosis not present

## 2019-12-10 DIAGNOSIS — Z8546 Personal history of malignant neoplasm of prostate: Secondary | ICD-10-CM | POA: Diagnosis not present

## 2019-12-17 DIAGNOSIS — C61 Malignant neoplasm of prostate: Secondary | ICD-10-CM | POA: Diagnosis not present

## 2019-12-17 DIAGNOSIS — R350 Frequency of micturition: Secondary | ICD-10-CM | POA: Diagnosis not present

## 2019-12-17 DIAGNOSIS — R351 Nocturia: Secondary | ICD-10-CM | POA: Diagnosis not present

## 2019-12-27 NOTE — Progress Notes (Signed)
HPI: FU coronary artery disease. Patient had a myocardial infarction in July of 2000. He was treated with TPA and had stenting of his LAD and PTCA of his second diagonal. Abdominal ultrasound in April of 2014 showed no aneurysm. Nuclear study May 2017 showed ejection fraction of 42%, inferoseptal ischemia and a prior anterior infarct with peri-infarct ischemia. Cardiac catheterization June 2017 showed a 50% proximal right coronary artery, 45% proximal to distal LAD and 50% circumflex. Ejection fraction 45-50%.  Most recent echocardiogram February 2020 showed ejection fraction 40 to 45%, mild left ventricular hypertrophy, grade 1 diastolic dysfunction.  Patient admitted February 2020 with volume excess.  He had an episode of atrial fibrillation as well but was not anticoagulated.  Since last seen,the patient denies any dyspnea on exertion, orthopnea, PND, pedal edema, palpitations, syncope or chest pain.   Current Outpatient Medications  Medication Sig Dispense Refill  . aspirin EC 81 MG tablet Take 81 mg by mouth 2 (two) times daily after a meal.    . Coenzyme Q10 (CO Q 10) 100 MG CAPS Take 100 mg by mouth daily.     . furosemide (LASIX) 20 MG tablet Take 20 mg by mouth as needed for fluid or edema.    . metoprolol tartrate (LOPRESSOR) 50 MG tablet Take 1 tablet (50 mg total) by mouth 2 (two) times daily. 180 tablet 3  . Multiple Vitamins-Minerals (CENTRUM SILVER ULTRA MENS PO) Take 1 tablet by mouth daily.     . nitroGLYCERIN (NITROSTAT) 0.4 MG SL tablet Place 1 tablet (0.4 mg total) under the tongue every 5 (five) minutes as needed. (Patient taking differently: Place 0.4 mg under the tongue every 5 (five) minutes as needed for chest pain. ) 25 tablet 12  . pravastatin (PRAVACHOL) 20 MG tablet Take 1 tablet (20 mg total) by mouth daily. 90 tablet 3  . lisinopril (ZESTRIL) 10 MG tablet Take 1 tablet (10 mg total) by mouth daily. 90 tablet 3   No current facility-administered medications for  this visit.     Past Medical History:  Diagnosis Date  . AKI (acute kidney injury) ([place]) 11/12/2018  . CAD (coronary artery disease)   . CHF (congestive heart failure) ([place])   . Dyspnea   . Foley catheter in place   . HTN (hypertension)   . Hyperlipidemia   . Myocardial infarction ([place]) 2000   REPORTS HE HAS HAD 5 HEART ATTACKS BUT SOME OF THE "WERE NOT SERIOUS"   . Obesity   . Prostate cancer [facility])     Past Surgical History:  Procedure Laterality Date  . CARDIAC CATHETERIZATION N/A 03/09/2016   Procedure: Left Heart Cath and Coronary Angiography;  Surgeon: Belva Crome, MD;  Location: [facility];  Service: Cardiovascular;  Laterality: N/A;  . RADIATION TO PROSTATE    . Stenting of LAD  2000  . TONSILLECTOMY    . TRANSURETHRAL RESECTION OF PROSTATE N/A 03/01/2019   Procedure: TRANSURETHRAL RESECTION OF THE PROSTATE (TURP);  Surgeon: Ardis Hughs, MD;  Location[facility];  Service: Urology;  Laterality: N/A;  90 MINS    Social History   Socioeconomic History  . Marital status: Married    Spouse name: Not on file  . Number of children: Not on file  . Years of education: Not on file  . Highest education level: Not on file  Occupational History  . Not on file  Tobacco Use  . Smoking status: Former Smoker  Quit date: 1964    Years since quitting: 57.2  . Smokeless tobacco: Never Used  Substance and Sexual Activity  . Alcohol use: Yes    Comment: OCC BEER AND WINE   . Drug use: Not Currently  . Sexual activity: Not on file  Other Topics Concern  . Not on file  Social History Narrative  . Not on file   Social Determinants of Health   Financial Resource Strain:   . Difficulty of Paying Living Expenses:   Food Insecurity:   . Worried About Charity fundraiser in the Last Year:   . Arboriculturist in the Last Year:   Transportation Needs:   . Film/video editor (Medical):   Marland Kitchen Lack of Transportation (Non-Medical):   Physical Activity:   . Days  of Exercise per Week:   . Minutes of Exercise per Session:   Stress:   . Feeling of Stress :   Social Connections:   . Frequency of Communication with Friends and Family:   . Frequency of Social Gatherings with Friends and Family:   . Attends Religious Services:   . Active Member of Clubs or Organizations:   . Attends Archivist Meetings:   Marland Kitchen Marital Status:   Intimate Partner Violence:   . Fear of Current or Ex-Partner:   . Emotionally Abused:   Marland Kitchen Physically Abused:   . Sexually Abused:     Family History  Problem Relation Age of Onset  . Hypertension Father     ROS: no fevers or chills, productive cough, hemoptysis, dysphasia, odynophagia, melena, hematochezia, dysuria, hematuria, rash, seizure activity, orthopnea, PND, pedal edema, claudication. Remaining systems are negative.  Physical Exam: Well-developed well-nourished in no acute distress.  Skin is warm and dry.  HEENT is normal.  Neck is supple.  Chest is clear to auscultation with normal expansion.  Cardiovascular exam is regular rate and rhythm.  Abdominal exam nontender or distended. No masses palpated. Extremities show no edema. neuro grossly intact  ECG-sinus rhythm with PACs, left anterior fascicular block, right bundle branch block, cannot rule out prior septal infarct.  Personally reviewed  A/P  1 coronary artery disease-patient doing well with no chest pain.  Continue aspirin and statin.  2 ischemic cardiomyopathy-continue ACE inhibitor and beta-blocker.  3 hypertension-patient's blood pressure is controlled today.  Continue present medical regimen. Check Bmet.  4 hyperlipidemia-continue statin.  Note patient did not tolerate high-dose statin therapy previously.  Check lipids and liver.  5 paroxysmal atrial fibrillation-patient remains in sinus rhythm.  I have previously recommended apixaban to reduce the risk of embolic event.  He declines and understands the higher risk of CVA.  Kirk Ruths, MD

## 2020-01-02 ENCOUNTER — Encounter: Payer: Self-pay | Admitting: Cardiology

## 2020-01-02 ENCOUNTER — Ambulatory Visit (INDEPENDENT_AMBULATORY_CARE_PROVIDER_SITE_OTHER): Payer: Medicare Other | Admitting: Cardiology

## 2020-01-02 ENCOUNTER — Other Ambulatory Visit: Payer: Self-pay

## 2020-01-02 VITALS — BP 100/62 | HR 72 | Temp 96.7°F | Ht 72.5 in | Wt 193.4 lb

## 2020-01-02 DIAGNOSIS — E7800 Pure hypercholesterolemia, unspecified: Secondary | ICD-10-CM

## 2020-01-02 DIAGNOSIS — I2510 Atherosclerotic heart disease of native coronary artery without angina pectoris: Secondary | ICD-10-CM | POA: Diagnosis not present

## 2020-01-02 DIAGNOSIS — Z23 Encounter for immunization: Secondary | ICD-10-CM | POA: Diagnosis not present

## 2020-01-02 DIAGNOSIS — I10 Essential (primary) hypertension: Secondary | ICD-10-CM

## 2020-01-02 DIAGNOSIS — I480 Paroxysmal atrial fibrillation: Secondary | ICD-10-CM | POA: Diagnosis not present

## 2020-01-02 LAB — COMPREHENSIVE METABOLIC PANEL
ALT: 15 IU/L (ref 0–44)
AST: 19 IU/L (ref 0–40)
Albumin/Globulin Ratio: 1.6 (ref 1.2–2.2)
Albumin: 4.1 g/dL (ref 3.6–4.6)
Alkaline Phosphatase: 67 IU/L (ref 39–117)
BUN/Creatinine Ratio: 25 — ABNORMAL HIGH (ref 10–24)
BUN: 23 mg/dL (ref 8–27)
Bilirubin Total: 0.4 mg/dL (ref 0.0–1.2)
CO2: 22 mmol/L (ref 20–29)
Calcium: 9.6 mg/dL (ref 8.6–10.2)
Chloride: 101 mmol/L (ref 96–106)
Creatinine, Ser: 0.93 mg/dL (ref 0.76–1.27)
GFR calc Af Amer: 87 mL/min/{1.73_m2} (ref >59)
GFR calc non Af Amer: 76 mL/min/{1.73_m2} (ref >59)
Globulin, Total: 2.5 g/dL (ref 1.5–4.5)
Glucose: 95 mg/dL (ref 65–99)
Potassium: 4.7 mmol/L (ref 3.5–5.2)
Sodium: 138 mmol/L (ref 134–144)
Total Protein: 6.6 g/dL (ref 6.0–8.5)

## 2020-01-02 LAB — LIPID PANEL
Chol/HDL Ratio: 3.9 ratio (ref 0.0–5.0)
Cholesterol, Total: 176 mg/dL (ref 100–199)
HDL: 45 mg/dL (ref >39)
LDL Chol Calc (NIH): 96 mg/dL (ref 0–99)
Triglycerides: 202 mg/dL — ABNORMAL HIGH (ref 0–149)
VLDL Cholesterol Cal: 35 mg/dL (ref 5–40)

## 2020-01-02 NOTE — Patient Instructions (Signed)
Medication Instructions:  NO CHANGE *If you need a refill on your cardiac medications before your next appointment, please call your pharmacy*   Lab Work: Your physician recommends that you HAVE LAB WORK TODAY If you have labs (blood work) drawn today and your tests are completely normal, you will receive your results only by: . MyChart Message (if you have MyChart) OR . A paper copy in the mail If you have any lab test that is abnormal or we need to change your treatment, we will call you to review the results.   Follow-Up: At [facility], you and your health needs are our priority.  As part of our continuing mission to provide you with exceptional heart care, we have created designated Provider Care Teams.  These Care Teams include your primary Cardiologist (physician) and Advanced Practice Providers (APPs -  Physician Assistants and Nurse Practitioners) who all work together to provide you with the care you need, when you need it.  We recommend signing up for the patient portal called "MyChart".  Sign up information is provided on this After Visit Summary.  MyChart is used to connect with patients for Virtual Visits (Telemedicine).  Patients are able to view lab/test results, encounter notes, upcoming appointments, etc.  Non-urgent messages can be sent to your provider as well.   To learn more about what you can do with MyChart, go to [email or web address].    Your next appointment:   12 month(s)  The format for your next appointment:   Either In Person or Virtual  Provider:   You may see Brian Crenshaw, MD or one of the following Advanced Practice Providers on your designated Care Team:    Luke Kilroy, [name]  Callie Goodrich, [name]  Jesse Cleaver, FNP     

## 2020-01-03 ENCOUNTER — Other Ambulatory Visit: Payer: Self-pay | Admitting: *Deleted

## 2020-01-03 DIAGNOSIS — E7800 Pure hypercholesterolemia, unspecified: Secondary | ICD-10-CM

## 2020-01-03 MED ORDER — EZETIMIBE 10 MG PO TABS: 10.0000 mg | ORAL_TABLET | Freq: Every day | ORAL | 3 refills | Status: DC

## 2020-01-03 NOTE — Progress Notes (Signed)
zetia 

## 2020-01-14 DIAGNOSIS — Z0000 Encounter for general adult medical examination without abnormal findings: Secondary | ICD-10-CM | POA: Diagnosis not present

## 2020-01-14 DIAGNOSIS — L570 Actinic keratosis: Secondary | ICD-10-CM | POA: Diagnosis not present

## 2020-01-14 DIAGNOSIS — I10 Essential (primary) hypertension: Secondary | ICD-10-CM | POA: Diagnosis not present

## 2020-01-14 DIAGNOSIS — E782 Mixed hyperlipidemia: Secondary | ICD-10-CM | POA: Diagnosis not present

## 2020-03-23 LAB — HEPATIC FUNCTION PANEL
ALT: 17 IU/L (ref 0–44)
AST: 21 IU/L (ref 0–40)
Albumin: 4.2 g/dL (ref 3.6–4.6)
Alkaline Phosphatase: 58 IU/L (ref 48–121)
Bilirubin Total: 0.5 mg/dL (ref 0.0–1.2)
Bilirubin, Direct: 0.14 mg/dL (ref 0.00–0.40)
Total Protein: 6.4 g/dL (ref 6.0–8.5)

## 2020-03-23 LAB — LIPID PANEL
Chol/HDL Ratio: 3 ratio (ref 0.0–5.0)
Cholesterol, Total: 149 mg/dL (ref 100–199)
HDL: 50 mg/dL (ref >39)
LDL Chol Calc (NIH): 75 mg/dL (ref 0–99)
Triglycerides: 140 mg/dL (ref 0–149)
VLDL Cholesterol Cal: 24 mg/dL (ref 5–40)

## 2020-04-07 IMAGING — US US RENAL
1 series · 14 of 25 positions shown · non-contrast
Comparison: None.

CLINICAL DATA: Acute kidney injury

EXAM:
RENAL / URINARY TRACT ULTRASOUND COMPLETE

[Series 1: us renal · 14 of 37 slices shown]
[im 1/37]
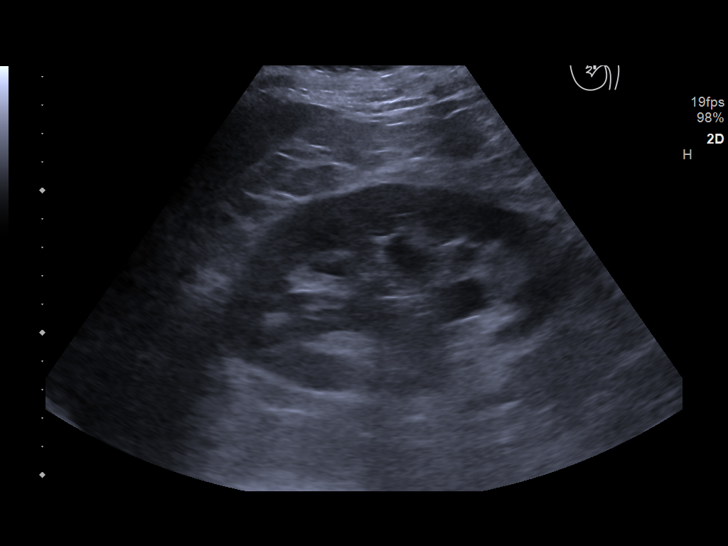
[im 4/37]
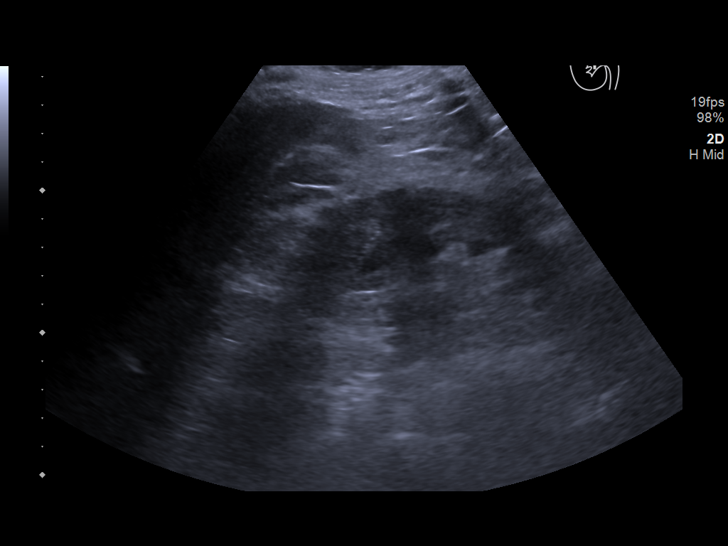
[im 7/37]
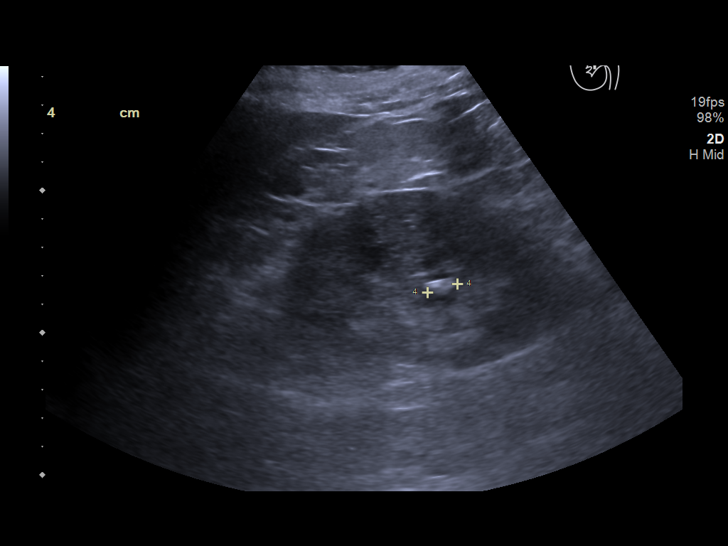
[im 10/37]
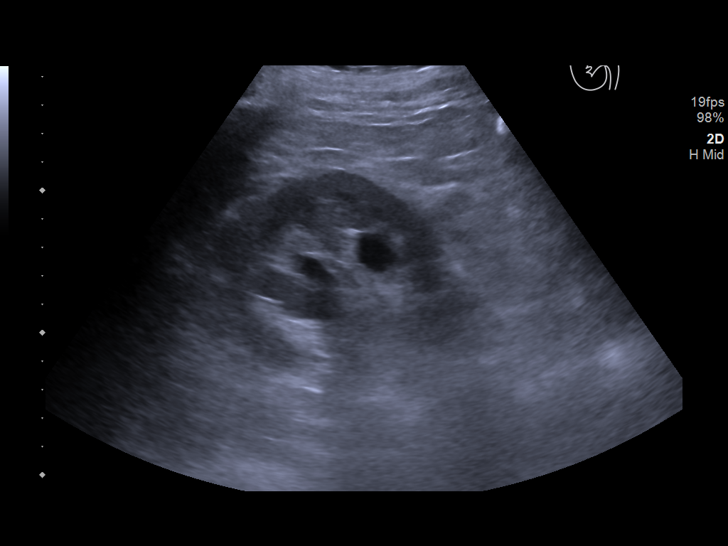
[im 13/37]
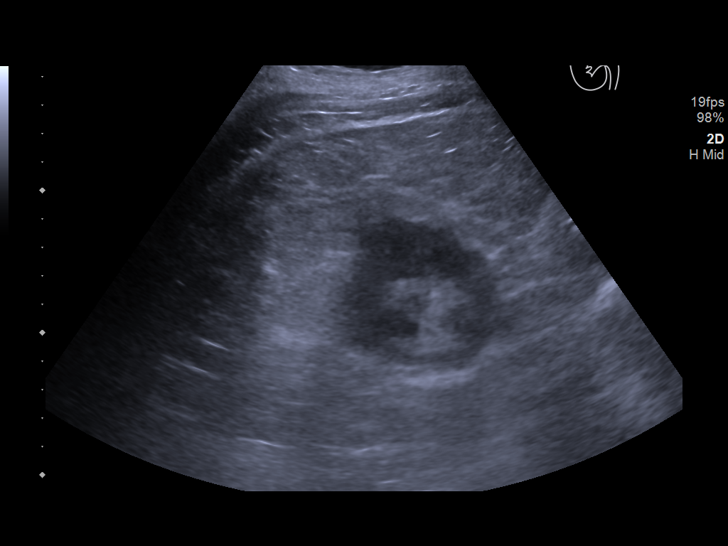
[im 14/37]
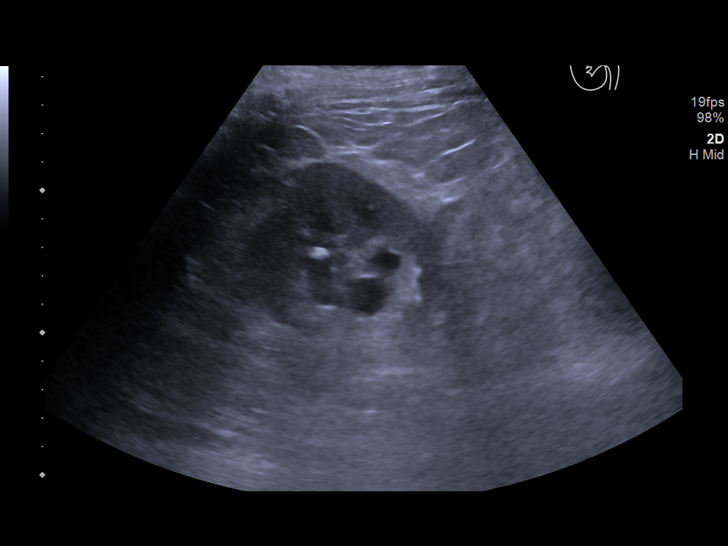
[im 17/37]
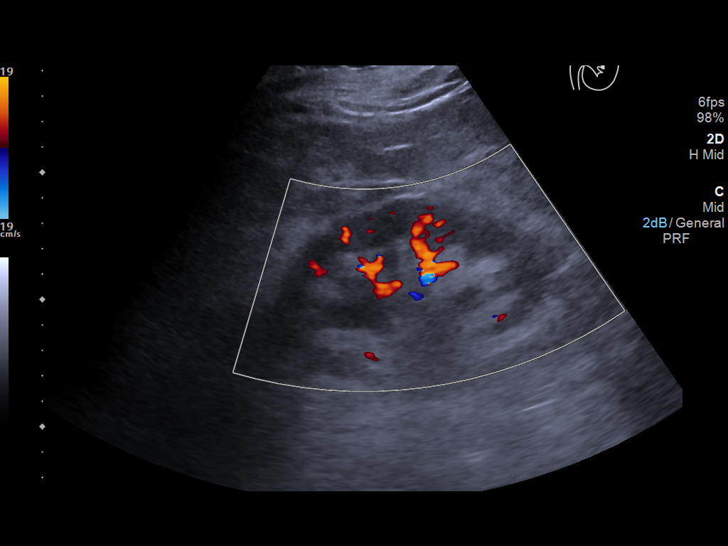
[im 20/37]
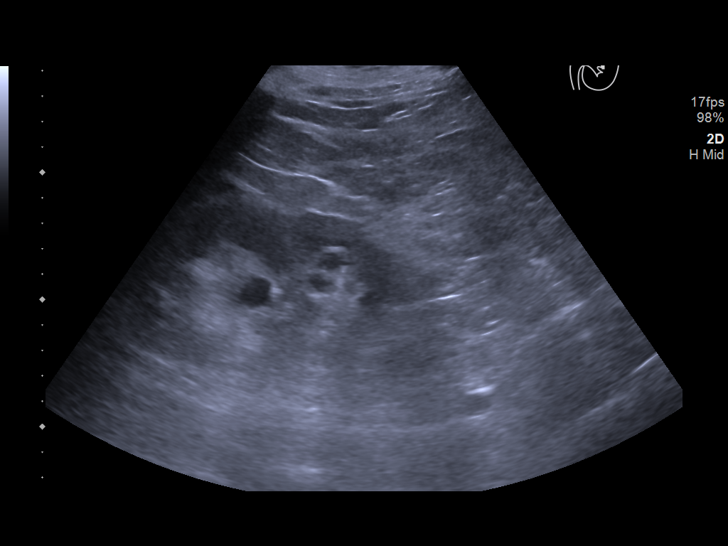
[im 23/37]
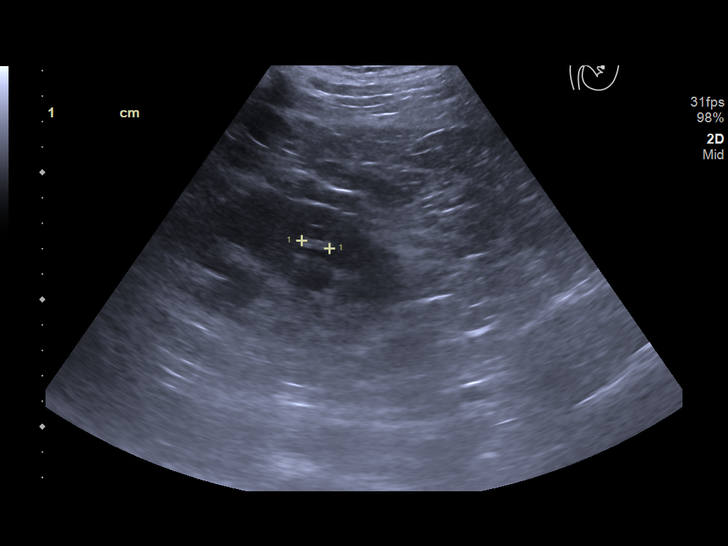
[im 25/37]
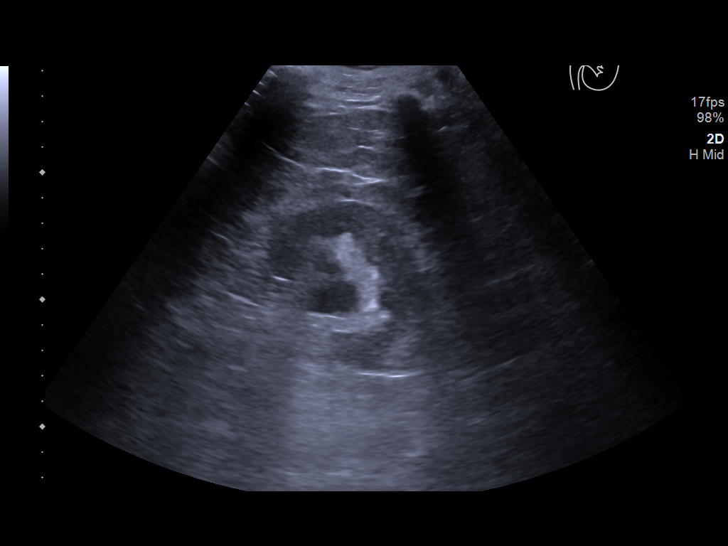
[im 28/37]
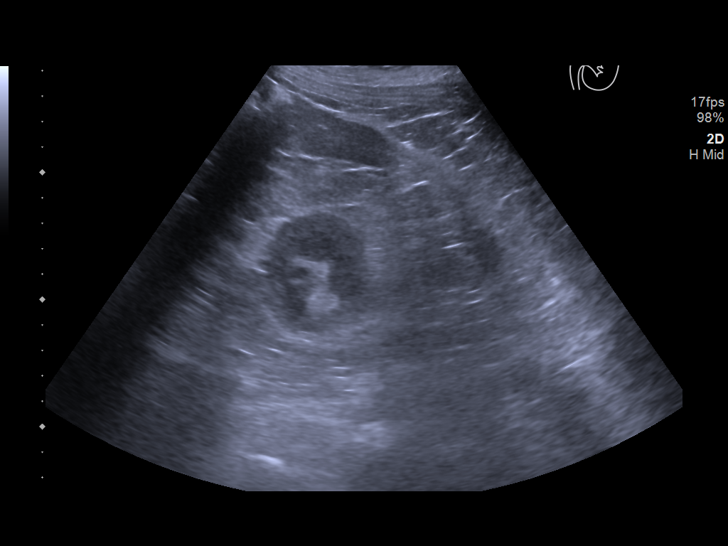
[im 31/37]
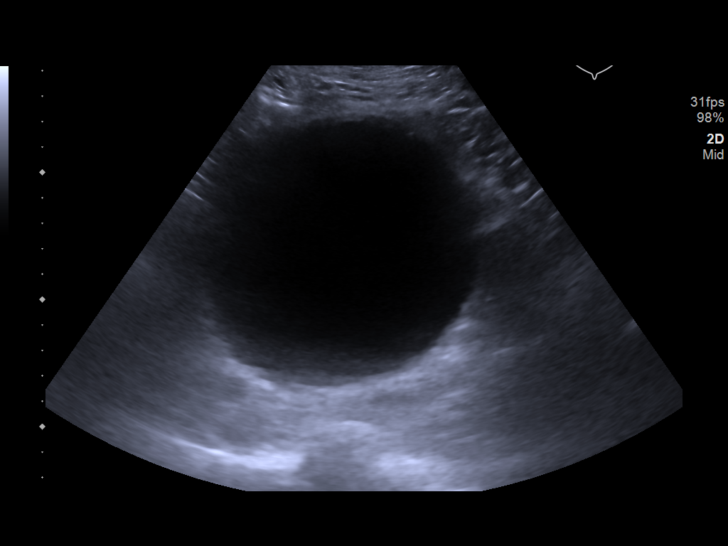
[im 34/37]
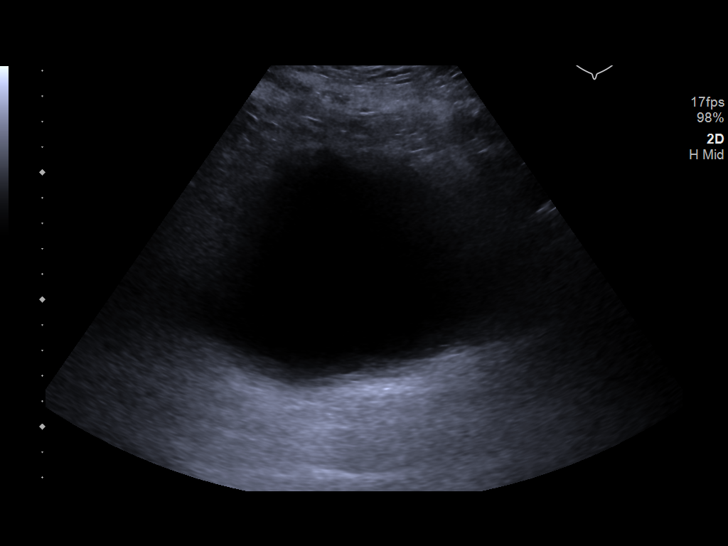
[im 37/37]
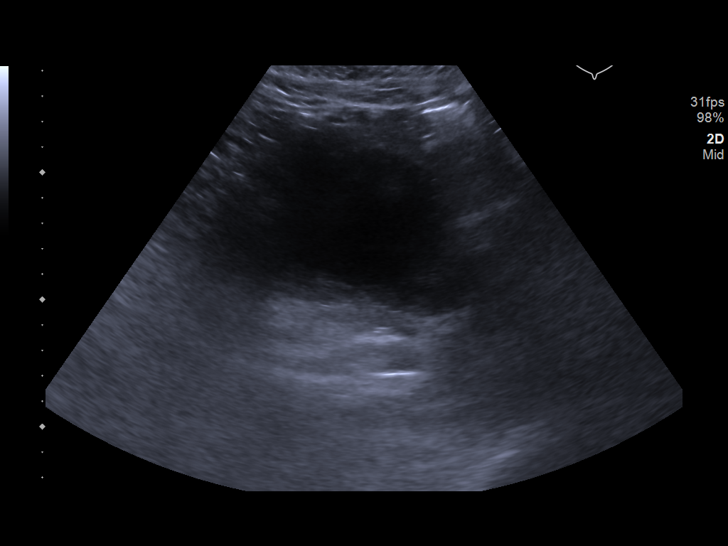

[14 of 25 positions shown; findings below may reference images not displayed]

FINDINGS: Right Kidney:

Renal measurements: 13 x 7 x 7 cm . Mild hydronephrosis. Discrete 11
mm echogenic area favoring stone.

Left Kidney:

Renal measurements: 12 x 7 x 5 cm. Mild hydronephrosis. A measured
echogenic area is not convincing for calculus.

Bladder:

Distended urinary bladder
IMPRESSION: 1. Full urinary bladder with mild bilateral hydronephrosis.
2. Non-obstructing right renal calculus.

## 2020-04-23 DIAGNOSIS — H2513 Age-related nuclear cataract, bilateral: Secondary | ICD-10-CM | POA: Diagnosis not present

## 2020-06-16 DIAGNOSIS — Z8546 Personal history of malignant neoplasm of prostate: Secondary | ICD-10-CM | POA: Diagnosis not present

## 2020-06-23 DIAGNOSIS — R351 Nocturia: Secondary | ICD-10-CM | POA: Diagnosis not present

## 2020-06-23 DIAGNOSIS — R350 Frequency of micturition: Secondary | ICD-10-CM | POA: Diagnosis not present

## 2020-06-24 ENCOUNTER — Other Ambulatory Visit: Payer: Self-pay | Admitting: Cardiology

## 2020-07-21 DIAGNOSIS — Z23 Encounter for immunization: Secondary | ICD-10-CM | POA: Diagnosis not present

## 2020-07-30 DIAGNOSIS — R339 Retention of urine, unspecified: Secondary | ICD-10-CM | POA: Diagnosis not present

## 2020-07-30 DIAGNOSIS — I700 Atherosclerosis of aorta: Secondary | ICD-10-CM | POA: Diagnosis not present

## 2020-07-30 DIAGNOSIS — R338 Other retention of urine: Secondary | ICD-10-CM | POA: Diagnosis not present

## 2020-07-30 DIAGNOSIS — N3289 Other specified disorders of bladder: Secondary | ICD-10-CM | POA: Diagnosis not present

## 2020-07-30 DIAGNOSIS — Z8546 Personal history of malignant neoplasm of prostate: Secondary | ICD-10-CM | POA: Diagnosis not present

## 2020-07-30 DIAGNOSIS — N320 Bladder-neck obstruction: Secondary | ICD-10-CM | POA: Diagnosis not present

## 2020-07-30 DIAGNOSIS — N2882 Megaloureter: Secondary | ICD-10-CM | POA: Diagnosis not present

## 2020-07-30 DIAGNOSIS — N1330 Unspecified hydronephrosis: Secondary | ICD-10-CM | POA: Diagnosis not present

## 2020-08-06 DIAGNOSIS — R9721 Rising PSA following treatment for malignant neoplasm of prostate: Secondary | ICD-10-CM | POA: Diagnosis not present

## 2020-08-06 DIAGNOSIS — C61 Malignant neoplasm of prostate: Secondary | ICD-10-CM | POA: Diagnosis not present

## 2020-08-06 DIAGNOSIS — N320 Bladder-neck obstruction: Secondary | ICD-10-CM | POA: Diagnosis not present

## 2020-08-07 ENCOUNTER — Other Ambulatory Visit: Payer: Self-pay | Admitting: Urology

## 2020-08-07 NOTE — Progress Notes (Signed)
PCP -  Cardiologist -   PPM/ICD -  Device Orders -  Rep Notified -   Chest x-ray -  EKG - 01-02-20 epic Stress Test -  ECHO - 11-13-18 epic Cardiac Cath - 2017  Sleep Study -  CPAP -   Fasting Blood Sugar -  Checks Blood Sugar _____ times a day  Blood Thinner Instructions: Aspirin Instructions:  ERAS Protcol - PRE-SURGERY Ensure or G2-   COVID TEST- 11-13   Anesthesia review: MI, CAD,with stents afib hx, HTN  Patient denies shortness of breath, fever, cough and chest pain at PAT appointment   All instructions explained to the patient, with a verbal understanding of the material. Patient agrees to go over the instructions while at home for a better understanding. Patient also instructed to self quarantine after being tested for COVID-19. The opportunity to ask questions was provided.

## 2020-08-07 NOTE — Patient Instructions (Addendum)
DUE TO COVID-19 ONLY ONE VISITOR IS ALLOWED TO COME WITH YOU AND STAY IN THE WAITING ROOM ONLY DURING PRE OP AND PROCEDURE DAY OF SURGERY. THE 1 VISITOR  MAY VISIT WITH YOU AFTER SURGERY IN YOUR PRIVATE ROOM DURING VISITING HOURS ONLY!  YOU NEED TO HAVE A COVID 19 TEST ON Saturday 08-15-2020 @ 10:25 am, THIS TEST MUST BE DONE BEFORE SURGERY,  COVID TESTING SITE [address]  [id number], IT IS ON THE RIGHT GOING OUT [address] APPROXIMATELY  2 MINUTES PAST [employer] ON THE RIGHT. ONCE YOUR COVID TEST IS COMPLETED,  PLEASE BEGIN THE QUARANTINE INSTRUCTIONS AS OUTLINED IN YOUR HANDOUT.                Ronald Kaiser  08/07/2020   Your procedure is scheduled on: Wednesday 08/19/2020   Report to [facility] Main  Entrance   Report to admitting at     0930 AM     Call this number if you have problems the morning of surgery [phone]    Remember: Do not eat food After Midnight. You may have clears until 0830 am then nothing by mouth    CLEAR LIQUID DIET   Foods Allowed                                                                      Foods Excluded  Black Coffee and tea, regular and decaf                             liquids that you cannot  Plain Jell-O any favor except red or purple                                           see through such as: Fruit ices (not with fruit pulp)                                                 milk, soups, orange juice  Iced Popsicles                                                   All solid food Carbonated beverages, regular and diet                                    Cranberry, grape and apple juices Sports drinks like Gatorade Lightly seasoned clear broth or consume(fat free) Sugar, honey syrup  _____________________________________________________________________     BRUSH YOUR TEETH MORNING OF SURGERY AND RINSE YOUR MOUTH OUT, NO CHEWING GUM CANDY OR MINTS.     Take these medicines the morning  of surgery: Metoprolol  You may not have any metal on your body including hair pins and              piercings  Do not wear jewelry, lotions, powders or colognes, deodorant                          Men may shave face and neck.   Do not bring valuables to the hospital. [place].  Contacts, dentures or bridgework may not be worn into surgery.  Leave suitcase in the car. After surgery it may be brought to your room.     Patients discharged the day of surgery will not be allowed to drive home. IF YOU ARE HAVING SURGERY AND GOING HOME THE SAME DAY, YOU MUST HAVE AN ADULT TO DRIVE YOU HOME AND BE WITH YOU FOR 24 HOURS. YOU MAY GO HOME BY TAXI OR UBER OR ORTHERWISE, BUT AN ADULT MUST ACCOMPANY YOU HOME AND STAY WITH YOU FOR 24 HOURS.  _____________________________________________________________________             [facility] - Preparing for Surgery Before surgery, you can play an important role.  Because skin is not sterile, your skin needs to be as free of germs as possible.  You can reduce the number of germs on your skin by washing with CHG (chlorahexidine gluconate) soap before surgery.  CHG is an antiseptic cleaner which kills germs and bonds with the skin to continue killing germs even after washing. Please DO NOT use if you have an allergy to CHG or antibacterial soaps.  If your skin becomes reddened/irritated stop using the CHG and inform your nurse when you arrive at Short Stay. Do not shave (including legs and underarms) for at least 48 hours prior to the first CHG shower.  You may shave your face/neck. Please follow these instructions carefully:  1.  Shower with CHG Soap the night before surgery and the  morning of Surgery.  2.  If you choose to wash your hair, wash your hair first as usual with your  normal  shampoo.  3.  After you shampoo, rinse your hair and body thoroughly to remove the  shampoo.                            4.  Use CHG as you would any other liquid soap.  You can apply chg directly  to the skin and wash                       Gently with a scrungie or clean washcloth.  5.  Apply the CHG Soap to your body ONLY FROM THE NECK DOWN.   Do not use on face/ open                           Wound or open sores. Avoid contact with eyes, ears mouth and genitals (private parts).                       Wash face,  Genitals (private parts) with your normal soap.             6.  Wash thoroughly, paying special attention to the area where your surgery  will be performed.  7.  Thoroughly rinse your body with warm water from the neck down.  8.  DO NOT shower/wash with your normal soap after using and rinsing off  the CHG Soap.                9.  Pat yourself dry with a clean towel.            10.  Wear clean pajamas.            11.  Place clean sheets on your bed the night of your first shower and do not  sleep with pets. Day of Surgery : Do not apply any lotions/deodorants the morning of surgery.  Please wear clean clothes to the hospital/surgery center.  FAILURE TO FOLLOW THESE INSTRUCTIONS MAY RESULT IN THE CANCELLATION OF YOUR SURGERY PATIENT SIGNATURE_________________________________  NURSE SIGNATURE__________________________________  ________________________________________________________________________

## 2020-08-10 ENCOUNTER — Encounter (HOSPITAL_COMMUNITY): Payer: Self-pay

## 2020-08-10 ENCOUNTER — Other Ambulatory Visit: Payer: Self-pay

## 2020-08-10 ENCOUNTER — Encounter (HOSPITAL_COMMUNITY)
Admission: RE | Admit: 2020-08-10 | Discharge: 2020-08-10 | Disposition: A | Discharge status: Home / Self Care | Payer: Medicare Other | Source: Ambulatory Visit | Attending: Urology | Admitting: Urology

## 2020-08-10 DIAGNOSIS — Z01812 Encounter for preprocedural laboratory examination: Secondary | ICD-10-CM | POA: Diagnosis not present

## 2020-08-10 LAB — BASIC METABOLIC PANEL
Anion gap: 7 (ref 5–15)
BUN: 29 mg/dL — ABNORMAL HIGH (ref 8–23)
CO2: 24 mmol/L (ref 22–32)
Calcium: 8.6 mg/dL — ABNORMAL LOW (ref 8.9–10.3)
Chloride: 103 mmol/L (ref 98–111)
Creatinine, Ser: 0.81 mg/dL (ref 0.61–1.24)
GFR, Estimated: >60 mL/min (ref >60)
Glucose, Bld: 96 mg/dL (ref 70–99)
Potassium: 4.4 mmol/L (ref 3.5–5.1)
Sodium: 134 mmol/L — ABNORMAL LOW (ref 135–145)

## 2020-08-10 LAB — CBC
HCT: 34.7 % — ABNORMAL LOW (ref 39.0–52.0)
Hemoglobin: 11.6 g/dL — ABNORMAL LOW (ref 13.0–17.0)
MCH: 33.2 pg (ref 26.0–34.0)
MCHC: 33.4 g/dL (ref 30.0–36.0)
MCV: 99.4 fL (ref 80.0–100.0)
Platelets: 238 10*3/uL (ref 150–400)
RBC: 3.49 MIL/uL — ABNORMAL LOW (ref 4.22–5.81)
RDW: 13.8 % (ref 11.5–15.5)
WBC: 5.9 10*3/uL (ref 4.0–10.5)
nRBC: 0 % (ref 0.0–0.2)

## 2020-08-12 NOTE — Progress Notes (Signed)
Anesthesia Chart Review   Case: [id number] Date/Time: 08/19/20 1115   Procedures:      TRANSURETHRAL RESECTION OF THE PROSTATE (TURP) (N/A )     BILATERAL RETROGRADE PYELOGRAM (Bilateral )   Anesthesia type: General   Pre-op diagnosis: URINARY RETENTION   Location: [facility] PROC[facility]URE [facility] / [facility]   Surgeons: Ardis Hughs, MD      DISCUSSION:86 y.o. former smoker (quit 10/03/62) with h/o HTN, CAD (myocardial infarction in July of 2000. He was treated with TPA and had stenting of his LAD and PTCA of his second diagonal), CHF, prostate cancer, urinary retention scheduled for above procedure 08/19/2020 with Dr. Louis Meckel.   Pt last seen by cardiology 01/02/2020. Per OV note pt stable with on chest pain.    Anticipate pt can proceed with planned procedure barring acute status change.   VS: BP 105/71   Pulse (!) 52   Temp 36.5 C (Oral)   Resp 16   Ht 6\' 1"  (1.854 m)   Wt 84.6 kg   SpO2 99%   BMI 24.59 kg/m   PROVIDERS: Christain Sacramento, MD  Is PCP   Kirk Ruths, MD is Cardiologist  LABS: Labs reviewed: Acceptable for surgery. (all labs ordered are listed, but only abnormal results are displayed)  Labs Reviewed  BASIC METABOLIC PANEL - Abnormal; Notable for the following components:      Result Value   Sodium 134 (*)    BUN 29 (*)    Calcium 8.6 (*)    All other components within normal limits  CBC - Abnormal; Notable for the following components:   RBC 3.49 (*)    Hemoglobin 11.6 (*)    HCT 34.7 (*)    All other components within normal limits     IMAGES:   EKG: 01/02/2020 Rate 63 bpm  Sinus rhythm with marked sinus arrhythmia RBBB LAFB Bifascicular block  Septal infarct, age undetermined  Lateral infarct, age undetermined   CV: Echo 11/13/2018 IMPRESSIONS    1. The left ventricle has mild-moderately reduced systolic function of  14-78%. The cavity size was normal. There is mild concentric left  ventricular hypertrophy. Left ventricular diastolic  Doppler parameters are  consistent with impaired relaxation.  2. There is akinesis of the apical anterior and anteroseptal left  ventricular segments, consistent with infarction in the distribution of  the distal LAD artery.  3. The right ventricle has normal systolic function. The cavity was  normal. There is no increase in right ventricular wall thickness.  4. The mitral valve is normal in structure.  5. The tricuspid valve is normal in structure.  6. The aortic valve is tricuspid, there is sclerosis without stenosis.  Aortic valve regurgitation is trivial by color flow Doppler. Past Medical History:  Diagnosis Date  . AKI (acute kidney injury) ([place]) 11/12/2018  . CAD (coronary artery disease)   . CHF (congestive heart failure) ([place])   . Dyspnea   . Foley catheter in place   . HTN (hypertension)   . Hyperlipidemia   . Myocardial infarction ([place]) 2000   REPORTS HE HAS HAD 5 HEART ATTACKS BUT SOME OF THE "WERE NOT SERIOUS"   . Obesity   . Prostate cancer [facility])     Past Surgical History:  Procedure Laterality Date  . CARDIAC CATHETERIZATION N/A 03/09/2016   Procedure: Left Heart Cath and Coronary Angiography;  Surgeon: Belva Crome, MD;  Location: [place] CV LAB;  Service: Cardiovascular;  Laterality: N/A;  . RADIATION  TO PROSTATE    . Stenting of LAD  2000  . TONSILLECTOMY    . TRANSURETHRAL RESECTION OF PROSTATE N/A 03/01/2019   Procedure: TRANSURETHRAL RESECTION OF THE PROSTATE (TURP);  Surgeon: Ardis Hughs, MD;  Location: [facility];  Service: Urology;  Laterality: N/A;  75 MINS    MEDICATIONS: . aspirin EC 81 MG tablet  . cholecalciferol (VITAMIN D3) 25 MCG (1000 UNIT) tablet  . Coenzyme Q10 (CO Q 10) 100 MG CAPS  . furosemide (LASIX) 20 MG tablet  . lisinopril (ZESTRIL) 10 MG tablet  . metoprolol tartrate (LOPRESSOR) 50 MG tablet  . Multiple Vitamins-Minerals (CENTRUM SILVER ULTRA MENS PO)  . nitroGLYCERIN (NITROSTAT) 0.4 MG SL tablet  . pravastatin  (PRAVACHOL) 20 MG tablet  . vitamin B-12 (CYANOCOBALAMIN) 1000 MCG tablet   No current facility-administered medications for this encounter.    Konrad Felix, [name] [facility]-Surgical Testing [phone]

## 2020-08-15 ENCOUNTER — Other Ambulatory Visit (HOSPITAL_COMMUNITY)
Admission: RE | Admit: 2020-08-15 | Discharge: 2020-08-15 | Disposition: A | Discharge status: Home / Self Care | Payer: Medicare Other | Source: Ambulatory Visit | Attending: Urology | Admitting: Urology

## 2020-08-15 DIAGNOSIS — Z01812 Encounter for preprocedural laboratory examination: Secondary | ICD-10-CM | POA: Insufficient documentation

## 2020-08-15 DIAGNOSIS — Z20822 Contact with and (suspected) exposure to covid-19: Secondary | ICD-10-CM | POA: Diagnosis not present

## 2020-08-15 LAB — SARS CORONAVIRUS 2 (TAT 6-24 HRS): SARS Coronavirus 2: NEGATIVE

## 2020-08-19 ENCOUNTER — Encounter (HOSPITAL_COMMUNITY): Payer: Self-pay | Admitting: Urology

## 2020-08-19 ENCOUNTER — Ambulatory Visit (HOSPITAL_COMMUNITY): Payer: Medicare Other | Admitting: Anesthesiology

## 2020-08-19 ENCOUNTER — Encounter (HOSPITAL_COMMUNITY)
Admission: RE | Disposition: A | Discharge status: Home / Self Care | Payer: Self-pay | Source: Home / Self Care | Attending: Urology

## 2020-08-19 ENCOUNTER — Ambulatory Visit (HOSPITAL_COMMUNITY): Payer: Medicare Other

## 2020-08-19 ENCOUNTER — Ambulatory Visit (HOSPITAL_COMMUNITY): Payer: Medicare Other | Admitting: Physician Assistant

## 2020-08-19 ENCOUNTER — Ambulatory Visit (HOSPITAL_COMMUNITY)
Admission: RE | Admit: 2020-08-19 | Discharge: 2020-08-19 | Disposition: A | Discharge status: Home / Self Care | Payer: Medicare Other | Attending: Urology | Admitting: Urology

## 2020-08-19 DIAGNOSIS — C61 Malignant neoplasm of prostate: Secondary | ICD-10-CM | POA: Diagnosis not present

## 2020-08-19 DIAGNOSIS — N4289 Other specified disorders of prostate: Secondary | ICD-10-CM | POA: Diagnosis not present

## 2020-08-19 DIAGNOSIS — I252 Old myocardial infarction: Secondary | ICD-10-CM | POA: Diagnosis not present

## 2020-08-19 DIAGNOSIS — N1339 Other hydronephrosis: Secondary | ICD-10-CM | POA: Diagnosis not present

## 2020-08-19 DIAGNOSIS — Z7982 Long term (current) use of aspirin: Secondary | ICD-10-CM | POA: Insufficient documentation

## 2020-08-19 DIAGNOSIS — E782 Mixed hyperlipidemia: Secondary | ICD-10-CM | POA: Diagnosis not present

## 2020-08-19 DIAGNOSIS — R338 Other retention of urine: Secondary | ICD-10-CM | POA: Insufficient documentation

## 2020-08-19 DIAGNOSIS — I452 Bifascicular block: Secondary | ICD-10-CM | POA: Insufficient documentation

## 2020-08-19 DIAGNOSIS — I4510 Unspecified right bundle-branch block: Secondary | ICD-10-CM | POA: Insufficient documentation

## 2020-08-19 DIAGNOSIS — Z841 Family history of disorders of kidney and ureter: Secondary | ICD-10-CM | POA: Insufficient documentation

## 2020-08-19 DIAGNOSIS — E876 Hypokalemia: Secondary | ICD-10-CM | POA: Diagnosis not present

## 2020-08-19 DIAGNOSIS — N138 Other obstructive and reflux uropathy: Secondary | ICD-10-CM | POA: Diagnosis not present

## 2020-08-19 DIAGNOSIS — Z87891 Personal history of nicotine dependence: Secondary | ICD-10-CM | POA: Insufficient documentation

## 2020-08-19 DIAGNOSIS — I110 Hypertensive heart disease with heart failure: Secondary | ICD-10-CM | POA: Insufficient documentation

## 2020-08-19 DIAGNOSIS — Z923 Personal history of irradiation: Secondary | ICD-10-CM | POA: Insufficient documentation

## 2020-08-19 DIAGNOSIS — N1330 Unspecified hydronephrosis: Secondary | ICD-10-CM | POA: Diagnosis not present

## 2020-08-19 DIAGNOSIS — N320 Bladder-neck obstruction: Secondary | ICD-10-CM

## 2020-08-19 DIAGNOSIS — N401 Enlarged prostate with lower urinary tract symptoms: Secondary | ICD-10-CM | POA: Diagnosis not present

## 2020-08-19 DIAGNOSIS — R339 Retention of urine, unspecified: Secondary | ICD-10-CM

## 2020-08-19 DIAGNOSIS — I509 Heart failure, unspecified: Secondary | ICD-10-CM | POA: Diagnosis not present

## 2020-08-19 DIAGNOSIS — Z79899 Other long term (current) drug therapy: Secondary | ICD-10-CM | POA: Diagnosis not present

## 2020-08-19 DIAGNOSIS — I2510 Atherosclerotic heart disease of native coronary artery without angina pectoris: Secondary | ICD-10-CM | POA: Insufficient documentation

## 2020-08-19 HISTORY — PX: TRANSURETHRAL RESECTION OF PROSTATE: SHX73

## 2020-08-19 HISTORY — PX: CYSTOSCOPY W/ RETROGRADES: SHX1426

## 2020-08-19 SURGERY — TURP (TRANSURETHRAL RESECTION OF PROSTATE)
Anesthesia: General

## 2020-08-19 MED ORDER — LIDOCAINE 2% (20 MG/ML) 5 ML SYRINGE
INTRAMUSCULAR | Status: DC | PRN
  Administered 2020-08-19: 100 mg via INTRAVENOUS

## 2020-08-19 MED ORDER — LIDOCAINE 2% (20 MG/ML) 5 ML SYRINGE
INTRAMUSCULAR | Status: AC
  Filled 2020-08-19: qty 5

## 2020-08-19 MED ORDER — ACETAMINOPHEN 500 MG PO TABS: 1000.0000 mg | ORAL_TABLET | Freq: Once | ORAL | Status: DC

## 2020-08-19 MED ORDER — ONDANSETRON HCL 4 MG/2ML IJ SOLN
INTRAMUSCULAR | Status: AC
  Filled 2020-08-19: qty 2

## 2020-08-19 MED ORDER — FLUORESCEIN SODIUM 10 % IV SOLN
INTRAVENOUS | Status: AC
  Filled 2020-08-19: qty 5

## 2020-08-19 MED ORDER — ACETAMINOPHEN 10 MG/ML IV SOLN
INTRAVENOUS | Status: AC
  Filled 2020-08-19: qty 100

## 2020-08-19 MED ORDER — SODIUM CHLORIDE 0.9 % IR SOLN
Status: DC | PRN
  Administered 2020-08-19 (×3): 3000 mL

## 2020-08-19 MED ORDER — 0.9 % SODIUM CHLORIDE (POUR BTL) OPTIME
TOPICAL | Status: DC | PRN
  Administered 2020-08-19: 1000 mL

## 2020-08-19 MED ORDER — ONDANSETRON HCL 4 MG/2ML IJ SOLN
INTRAMUSCULAR | Status: DC | PRN
  Administered 2020-08-19: 4 mg via INTRAVENOUS

## 2020-08-19 MED ORDER — EPHEDRINE SULFATE-NACL 50-0.9 MG/10ML-% IV SOSY
PREFILLED_SYRINGE | INTRAVENOUS | Status: DC | PRN
  Administered 2020-08-19: 15 mg via INTRAVENOUS

## 2020-08-19 MED ORDER — FENTANYL CITRATE (PF) 100 MCG/2ML IJ SOLN
INTRAMUSCULAR | Status: AC
  Filled 2020-08-19: qty 2

## 2020-08-19 MED ORDER — PROPOFOL 10 MG/ML IV BOLUS
INTRAVENOUS | Status: DC | PRN
  Administered 2020-08-19: 100 mg via INTRAVENOUS
  Administered 2020-08-19: 50 mg via INTRAVENOUS

## 2020-08-19 MED ORDER — LACTATED RINGERS IV SOLN: INTRAVENOUS | Status: DC

## 2020-08-19 MED ORDER — BELLADONNA ALKALOIDS-OPIUM 16.2-60 MG RE SUPP
RECTAL | Status: DC | PRN
  Administered 2020-08-19: 1 via RECTAL

## 2020-08-19 MED ORDER — FENTANYL CITRATE (PF) 100 MCG/2ML IJ SOLN
INTRAMUSCULAR | Status: DC | PRN
  Administered 2020-08-19 (×2): 50 ug via INTRAVENOUS

## 2020-08-19 MED ORDER — FENTANYL CITRATE (PF) 100 MCG/2ML IJ SOLN: 25.0000 ug | INTRAMUSCULAR | Status: DC | PRN

## 2020-08-19 MED ORDER — CIPROFLOXACIN IN D5W 400 MG/200ML IV SOLN
400.0000 mg | INTRAVENOUS | Status: AC
  Administered 2020-08-19: 400 mg via INTRAVENOUS
  Filled 2020-08-19: qty 200

## 2020-08-19 MED ORDER — ORAL CARE MOUTH RINSE: 15.0000 mL | Freq: Once | OROMUCOSAL | Status: AC

## 2020-08-19 MED ORDER — PROPOFOL 10 MG/ML IV BOLUS
INTRAVENOUS | Status: AC
  Filled 2020-08-19: qty 20

## 2020-08-19 MED ORDER — ACETAMINOPHEN 10 MG/ML IV SOLN
INTRAVENOUS | Status: DC | PRN
  Administered 2020-08-19: 1000 mg via INTRAVENOUS

## 2020-08-19 MED ORDER — DEXMEDETOMIDINE (PRECEDEX) IN NS 20 MCG/5ML (4 MCG/ML) IV SYRINGE
PREFILLED_SYRINGE | INTRAVENOUS | Status: AC
  Filled 2020-08-19: qty 5

## 2020-08-19 MED ORDER — PHENYLEPHRINE 40 MCG/ML (10ML) SYRINGE FOR IV PUSH (FOR BLOOD PRESSURE SUPPORT)
PREFILLED_SYRINGE | INTRAVENOUS | Status: DC | PRN
  Administered 2020-08-19: 120 ug via INTRAVENOUS

## 2020-08-19 MED ORDER — CHLORHEXIDINE GLUCONATE 0.12 % MT SOLN
15.0000 mL | Freq: Once | OROMUCOSAL | Status: AC
  Administered 2020-08-19: 15 mL via OROMUCOSAL

## 2020-08-19 MED ORDER — BELLADONNA ALKALOIDS-OPIUM 16.2-30 MG RE SUPP
RECTAL | Status: AC
  Filled 2020-08-19: qty 1

## 2020-08-19 MED ORDER — IOPAMIDOL (ISOVUE-300) INJECTION 61%
INTRAVENOUS | Status: DC | PRN
  Administered 2020-08-19: 20 mL via URETHRAL

## 2020-08-19 SURGICAL SUPPLY — 33 items
BAG URINE DRAIN 2000ML AR STRL (UROLOGICAL SUPPLIES) ×4 IMPLANT
BAG URO CATCHER STRL LF (MISCELLANEOUS) ×4 IMPLANT
CATH FOLEY 2WAY SLVR  5CC 20FR (CATHETERS) ×2
CATH FOLEY 2WAY SLVR 5CC 20FR (CATHETERS) ×2 IMPLANT
CATH FOLEY 3WAY 30CC 22FR (CATHETERS) IMPLANT
CATH FOLEY 3WAY 30CC 24FR (CATHETERS)
CATH URET 5FR 28IN OPEN ENDED (CATHETERS) ×4 IMPLANT
CATH URTH STD 24FR FL 3W 2 (CATHETERS) IMPLANT
CLOTH BEACON ORANGE TIMEOUT ST (SAFETY) ×4 IMPLANT
COVER WAND RF STERILE (DRAPES) IMPLANT
ELECT REM PT RETURN 15FT ADLT (MISCELLANEOUS) ×4 IMPLANT
GLOVE BIO SURGEON STRL SZ7.5 (GLOVE) ×4 IMPLANT
GOWN STRL REUS W/TWL LRG LVL3 (GOWN DISPOSABLE) ×4 IMPLANT
GOWN STRL REUS W/TWL XL LVL3 (GOWN DISPOSABLE) ×4 IMPLANT
GUIDEWIRE STR DUAL SENSOR (WIRE) IMPLANT
HOLDER FOLEY CATH W/STRAP (MISCELLANEOUS) ×4 IMPLANT
KIT TURNOVER KIT A (KITS) IMPLANT
LASER FIB FLEXIVA PULSE ID 365 (Laser) IMPLANT
LASER FIB FLEXIVA PULSE ID 550 (Laser) IMPLANT
LASER FIB FLEXIVA PULSE ID 910 (Laser) IMPLANT
LOOP CUT BIPOLAR 24F LRG (ELECTROSURGICAL) ×4 IMPLANT
MANIFOLD NEPTUNE II (INSTRUMENTS) ×4 IMPLANT
NS IRRIG 1000ML POUR BTL (IV SOLUTION) IMPLANT
PACK CYSTO (CUSTOM PROCEDURE TRAY) ×4 IMPLANT
PENCIL SMOKE EVACUATOR (MISCELLANEOUS) IMPLANT
SYR 30ML LL (SYRINGE) ×4 IMPLANT
SYR TOOMEY IRRIG 70ML (MISCELLANEOUS) ×4
SYRINGE TOOMEY IRRIG 70ML (MISCELLANEOUS) ×2 IMPLANT
TRACTIP FLEXIVA PULS ID 200XHI (Laser) IMPLANT
TRACTIP FLEXIVA PULSE ID 200 (Laser)
TUBING CONNECTING 10 (TUBING) ×3 IMPLANT
TUBING CONNECTING 10' (TUBING) ×1
TUBING UROLOGY SET (TUBING) ×4 IMPLANT

## 2020-08-19 NOTE — Op Note (Signed)
Preoperative diagnosis:  1. Urinary retention 2. Prostate cancer 3. Dystrophic prostatic calcifications 4. Bilateral hydroureteronephrosis  Postoperative diagnosis:  1. Same  Procedure: 1. ReTURP 2. Bilateral retrograde pyelograms with interpretation  Surgeon: Ardis Hughs, MD  Anesthesia: General  Complications: None  Intraoperative findings:  #1: The retrograde pyelogram on the patient's left side demonstrated hydroureteronephrosis all the way down to the bladder with no filling defects or other abnormality. #2: The patient's right retrograde pyelogram demonstrated narrowing at the UVJ but no filling defects or abnormality along the ureter or upper tract.  There was hydro down to the area of the right UVJ. #3: The above findings are consistent with chronic bladder outlet obstruction. #4:  The patient had some dystrophic calcifications along the prostatic urethra that were resected entirely.  EBL: Minimal  Specimens: Prostate chips  Indication: Ronald Kaiser is a 86 y.o. patient with history of recurrent prostate cancer after being treated with radiation.  He underwent a TURP approximate 1 year ago and then represented with urinary retention.  In clinic a catheter was placed and he was noted to have calcifications within the prostatic urethra.  After reviewing the management options for treatment, he elected to proceed with the above surgical procedure(s). We have discussed the potential benefits and risks of the procedure, side effects of the proposed treatment, the likelihood of the patient achieving the goals of the procedure, and any potential problems that might occur during the procedure or recuperation. Informed consent has been obtained.  Description of procedure:  The patient was taken to the operating room and general anesthesia was induced.  The patient was placed in the dorsal lithotomy position, prepped and draped in the usual sterile fashion, and preoperative  antibiotics were administered. A preoperative time-out was performed.   21 French 0 degree cystoscope was only passed to the patient's urethra into the bladder under visual guidance.  Cystoscopy was then performed with the above findings.  The patient did have some trabeculations within the bladder.  I subsequently performed a retrograde pyelogram with a 5 [place] open-ended ureteral catheter with the above findings.  I did this bilaterally.  I used 10 cc of Omnipaque contrast on each side.  The findings were consistent with chronic bladder outlet obstruction.  I then remove the 21 French sheath and passed a 26 French sheath through the visual obturator.  I then exchanged the obturator for the loop element and performed a reTURP removing the calcifications.  I resected tissue and a 360 degree fashion in the prostatic urethra.  I did note what appeared to be prostate cancer and the tissues on the patient's left lateral lobe.  Hemostasis was noted to be quite excellent.  I then irrigated out all the prostate chips and sent them to pathology.  I passed a 20 [place] two-way Foley catheter inserted 30 cc into the balloon.  I irrigated the patient's bladder which then quickly irrigated to pink.  The Foley was left to gravity.  A B&O suppository was placed in the patient's rectum.  He was subsequently extubated and returned the PACU in stable condition.  Disposition: The patient will be scheduled for follow-up on Monday for a voiding trial.  Ardis Hughs, M.D.

## 2020-08-19 NOTE — Anesthesia Postprocedure Evaluation (Signed)
Anesthesia Post Note  Patient: Ronald Kaiser  Procedure(s) Performed: TRANSURETHRAL RESECTION OF THE PROSTATE (TURP) (N/A ) BILATERAL RETROGRADE PYELOGRAM (Bilateral )     Patient location during evaluation: PACU Anesthesia Type: General Level of consciousness: awake and alert Pain management: pain level controlled Vital Signs Assessment: post-procedure vital signs reviewed and stable Respiratory status: spontaneous breathing, nonlabored ventilation, respiratory function stable and patient connected to nasal cannula oxygen Cardiovascular status: blood pressure returned to baseline and stable Postop Assessment: no apparent nausea or vomiting Anesthetic complications: no   No complications documented.  Last Vitals:  Vitals:   08/19/20 1400 08/19/20 1415  BP: (!) 103/54 (!) 107/53  Pulse: (!) 46 (!) 53  Resp: 14 16  Temp:    SpO2: 100%     Last Pain:  Vitals:   08/19/20 1415  TempSrc:   PainSc: 0-No pain                 Carmin Dibartolo L Kylie Simmonds

## 2020-08-19 NOTE — H&P (Signed)
38 with history of biochemical recurrent prostate cancer and urinary retention (TURP 2020) who presented 1 week ago with weak stream and incontinence. He was seen by the oncall urology team and a catheter was unsuccessfully placed after he was noted to be in retention. Ultimately he had cystoscopy to assist with catheter placement and he was noted to have dystrophic calcifications at his bladder neck contributing to his obstruction. His bladder neck was dilaterd and a catheter was successfully placed. A CT scan was showed that he had bilateral hydronephrosis.   He presents today for f/u and discussion of further management of his urinary retention. HAs tolerated the catheter well. He was placed on Cipro 250mg  BID x 7 days and tamsulosin.     ALLERGIES: Bactrim DS TABS - Hives (Moderate to Severe)    MEDICATIONS: Cipro 250 mg tablet 1 tablet PO Q 12 H  Lisinopril 10 mg tablet  Tamsulosin Hcl 0.4 mg capsule 1 capsule PO BID  Adult Low Dose Aspirin Ec 81 mg tablet, delayed release  Centrum Silver  CoQ-10 100 MG Oral Capsule Oral  Furosemide 20 mg tablet  Furosemide 20 mg tablet  Metoprolol Tartrate 50 mg tablet Oral  Nitrostat 0.4 MG Sublingual Tablet Sublingual Sublingual  Omega-3 CAPS Oral  Os-Cal 500-Vit D3  Pravastatin Sodium 40 mg tablet Oral  Vitamin D3     Notes: Nitrostat is PRN, Furosemide is PRN   GU PSH: Cysto Dilate Stricture (M or F) - 07/30/2020 Cystoscopy TURP - 03/01/2019       PSH Notes: Heart Surgery (2 heart stents), Prostate Surgery   NON-GU PSH: No Non-GU PSH    GU PMH: Bladder-neck stenosis/contracture - 07/30/2020 History of prostate cancer - 07/30/2020, - 2019, - 2018, - 2017, - 2017, History of prostate cancer, - 2014 Urinary Retention, Unspec - 07/30/2020, - 02/19/2019, - 2020, - 2020, - 2020, - 2020, - 2020, - 2020 Nocturia - 06/23/2020, - 12/17/2019 Rising PSA after prostate cancer treatment - 06/23/2020, - 2017 Urinary Frequency - 06/23/2020, -  12/17/2019 BPH w/LUTS - 2020 Urinary Tract Inf, Unspec site - 2020, - 2020 Secondary bone metastases - 2019 Prostate Cancer, Carcinoma of prostate - 2016      PMH Notes: Prostate Cancer:  2003: initial Gleason grade 8. XRTw/ neo/adjuvant testosterone blockade (Dr. Sharen [place] in [place]). ADT approximately 3 years.   Restaging:  09/20/13 CXR: no evidence of metastatic disease.  09/20/13 Bone scan: Left mid cervical spin and right lower lumbar spine. No other areas of concern.  09/24/13 C spine & Lumbar spine films: no evidence of metastatic disease.   PSA (testosterone):  16.6 on 03/19/16  15.77 on 09/14/15  18.51 on 9/12/1  15.57 on 03/10/15  14.31 12/01/14  11.07 09/02/14  10.14 05/20/14  8.6 02/11/14  8.75 (162) 10/11/13  10.66 08/16/13  4.31 04/24/12  1.58 03/01/10   doubling time:  2/16: 17.5 months   PMH: CHF, CAD (stents)    NON-GU PMH: Encounter for general adult medical examination without abnormal findings, Encounter for preventive health examination - 2016 Myocardial Infarction, History of myocardial infarction - 2014 Personal history of other diseases of the circulatory system, History of cardiac disorder - 2014, History of cardiac murmur, - 2014    FAMILY HISTORY: Alzheimer's Disease - Runs In Family Death of family member - Runs In Family renal failure - Runs In Family   SOCIAL HISTORY: Marital Status: Married Preferred Language: English; Race: Declined to Specify Current Smoking Status: Patient does not smoke  anymore.  Does not use smokeless tobacco. Social Drinker.  Does not use drugs. Drinks 1 caffeinated drink per day.     Notes: Number of children, Former smoker, Caffeine use, Alcohol use, Retired, Married   REVIEW OF SYSTEMS:    GU Review Male:   Patient denies frequent urination, hard to postpone urination, burning/ pain with urination, get up at night to urinate, leakage of urine, stream starts and stops, trouble starting your stream, have to  strain to urinate , erection problems, and penile pain.  Gastrointestinal (Upper):   Patient denies nausea, vomiting, and indigestion/ heartburn.  Gastrointestinal (Lower):   Patient denies diarrhea and constipation.  Constitutional:   Patient denies fever, night sweats, weight loss, and fatigue.  Skin:   Patient denies skin rash/ lesion and itching.  Eyes:   Patient denies blurred vision and double vision.  Ears/ Nose/ Throat:   Patient denies sore throat and sinus problems.  Hematologic/Lymphatic:   Patient denies swollen glands and easy bruising.  Cardiovascular:   Patient denies leg swelling and chest pains.  Respiratory:   Patient denies cough and shortness of breath.  Endocrine:   Patient denies excessive thirst.  Musculoskeletal:   Patient denies back pain and joint pain.  Neurological:   Patient denies headaches and dizziness.  Psychologic:   Patient denies depression and anxiety.   Notes: Patient has a foley catheter    VITAL SIGNS:      08/06/2020 12:38 PM  BP 92/54 mmHg  Pulse 51 /min  Temperature 97.3 F / 36.2 C   MULTI-SYSTEM PHYSICAL EXAMINATION:    Constitutional: Well-nourished. No physical deformities. Normally developed. Good grooming.  Respiratory: No labored breathing, no use of accessory muscles.   Cardiovascular: Normal temperature, normal extremity pulses, no swelling, no varicosities.     Complexity of Data:  Source Of History:  Patient  Records Review:   Pathology Reports, Previous Doctor Records, Previous Hospital Records, Previous Patient Records  Urine Test Review:   Urinalysis   06/16/20 12/10/19 06/12/19 06/13/18 03/27/18 12/12/17 08/29/17 03/13/17  PSA  Total PSA 11.80 ng/mL 13.60 ng/mL 6.55 ng/mL 21.10 ng/mL 20.80 ng/mL 21.60 ng/mL 20.20 ng/mL 17.10 ng/dl    11/11/13  Hormones  Testosterone, Total 162     PROCEDURES: None   ASSESSMENT:      ICD-10 Details  1 GU:   Bladder-neck stenosis/contracture - N32.0   2   Prostate Cancer - C61   3    Rising PSA after prostate cancer treatment - R97.21    PLAN:           Document Letter(s):  Created for Patient: Clinical Summary         Notes:   Patient developed dystrophic calcifications which subsequently resulted in obstruction at the bladder neck and urinary retention. I recommended that we proceed to the OR for re-TURP. At the same time we'd also plan to perform bilateral retrogrde pyelograms to assess his hydronephrosis. We will try and do this as an outpatient and get this done ASAP.

## 2020-08-19 NOTE — Anesthesia Preprocedure Evaluation (Addendum)
Anesthesia Evaluation  Patient identified by MRN, date of birth, ID band Patient awake    Reviewed: Allergy & Precautions, NPO status , Patient's Chart, lab work & pertinent test results, reviewed documented beta blocker date and time   Airway Mallampati: II  TM Distance: >3 FB Neck ROM: Full    Dental no notable dental hx. (+) Teeth Intact, Dental Advisory Given   Pulmonary neg pulmonary ROS, former smoker,    Pulmonary exam normal breath sounds clear to auscultation       Cardiovascular hypertension, Pt. on home beta blockers and Pt. on medications + CAD, + Past MI, + Cardiac Stents (2000) and +CHF  Normal cardiovascular exam Rhythm:Regular Rate:Normal  HLD  EKG: 01/02/2020 Rate 63 bpm  Sinus rhythm with marked sinus arrhythmia RBBB LAFB Bifascicular block  Septal infarct, age undetermined  Lateral infarct, age undetermined   Echo 11/13/2018 IMPRESSIONS  1. The left ventricle has mild-moderately reduced systolic function of 73-41%. The cavity size was normal. There is mild concentric left ventricular hypertrophy. Left ventricular diastolic Doppler parameters are consistent with impaired relaxation.  2. There is akinesis of the apical anterior and anteroseptal left ventricular segments, consistent with infarction in the distribution of the distal LAD artery.  3. The right ventricle has normal systolic function. The cavity was normal. There is no increase in right ventricular wall thickness.  4. The mitral valve is normal in structure.  5. The tricuspid valve is normal in structure.  6. The aortic valve is tricuspid, there is sclerosis without stenosis. Aortic valve regurgitation is trivial by color flow Doppler  LHC 2017 1.Ost RCA to Prox RCA lesion, 50% stenosed. 2.Prox LAD to Dist LAD lesion, 45% stenosed. The lesion was previously treated with a stent (unknown type). 3.Prox Cx to Mid Cx lesion, 50%  stenosed. Mild-to-moderate LAD in-stent restenosis without any evidence of high-grade obstruction. Moderate proximal RCA stenosis greater than 50%. There is mild to moderate mid circumflex stenosis. Low normal LV systolic function with an estimated EF 45-50%. Mildly elevated left ventricular end-diastolic pressures. Abnormal noninvasive studies, high risk myocardial perfusion imaging without anatomy to explain any evidence of ischemia. Decreased EF and scarring from prior infarction is the likely explanation for the interpretation    Neuro/Psych negative neurological ROS  negative psych ROS   GI/Hepatic negative GI ROS, Neg liver ROS,   Endo/Other  negative endocrine ROS  Renal/GU negative Renal ROS  negative genitourinary   Musculoskeletal  (+) Arthritis ,   Abdominal   Peds  Hematology negative hematology ROS (+)   Anesthesia Other Findings TURP for urinary retention  Reproductive/Obstetrics                            Anesthesia Physical Anesthesia Plan  ASA: III  Anesthesia Plan: General   Post-op Pain Management:    Induction: Intravenous  PONV Risk Score and Plan: 2 and Ondansetron and Dexamethasone  Airway Management Planned: LMA  Additional Equipment:   Intra-op Plan:   Post-operative Plan: Extubation in OR  Informed Consent: I have reviewed the patients History and Physical, chart, labs and discussed the procedure including the risks, benefits and alternatives for the proposed anesthesia with the patient or authorized representative who has indicated his/her understanding and acceptance.     Dental advisory given  Plan Discussed with: CRNA  Anesthesia Plan Comments:         Anesthesia Quick Evaluation

## 2020-08-19 NOTE — Interval H&P Note (Signed)
History and Physical Interval Note:  08/19/2020 11:53 AM  Ronald Kaiser  has presented today for surgery, with the diagnosis of URINARY RETENTION.  The various methods of treatment have been discussed with the patient and family. After consideration of risks, benefits and other options for treatment, the patient has consented to  Procedure(s): TRANSURETHRAL RESECTION OF THE PROSTATE (TURP) (N/A) BILATERAL RETROGRADE PYELOGRAM (Bilateral) as a surgical intervention.  The patient's history has been reviewed, patient examined, no change in status, stable for surgery.  I have reviewed the patient's chart and labs.  Questions were answered to the patient's satisfaction.     Ardis Hughs

## 2020-08-19 NOTE — Discharge Instructions (Signed)

## 2020-08-19 NOTE — Anesthesia Procedure Notes (Signed)
Procedure Name: LMA Insertion Date/Time: 08/19/2020 12:13 PM Performed by: [place] Aleecia Tapia C, CRNA Pre-anesthesia Checklist: Patient identified, Emergency Drugs available, Suction available and Patient being monitored Patient Re-evaluated:Patient Re-evaluated prior to induction Oxygen Delivery Method: Circle system utilized Preoxygenation: Pre-oxygenation with 100% oxygen Induction Type: IV induction Ventilation: Mask ventilation without difficulty LMA: LMA inserted LMA Size: 4.0 Number of attempts: 1 Airway Equipment and Method: Bite block Placement Confirmation: positive ETCO2 Tube secured with: Tape Dental Injury: Teeth and Oropharynx as per pre-operative assessment  Comments: Attempted size 5 LMA first- too large; placed size 4 LMA- great seal

## 2020-08-19 NOTE — Transfer of Care (Signed)
Immediate Anesthesia Transfer of Care Note  Patient: Ronald Kaiser  Procedure(s) Performed: TRANSURETHRAL RESECTION OF THE PROSTATE (TURP) (N/A ) BILATERAL RETROGRADE PYELOGRAM (Bilateral )  Patient Location: PACU  Anesthesia Type:General  Level of Consciousness: awake, alert  and oriented  Airway & Oxygen Therapy: Patient Spontanous Breathing and Patient connected to face mask oxygen  Post-op Assessment: Report given to RN and Post -op Vital signs reviewed and stable  Post vital signs: Reviewed and stable  Last Vitals:  Vitals Value Taken Time  BP 113/59 08/19/20 1325  Temp 36.4 C 08/19/20 1325  Pulse 55 08/19/20 1330  Resp 14 08/19/20 1330  SpO2 100 % 08/19/20 1330  Vitals shown include unvalidated device data.  Last Pain:  Vitals:   08/19/20 1007  TempSrc: Oral  PainSc:       Patients Stated Pain Goal: 3 (78/37/54 2370)  Complications: No complications documented.

## 2020-08-20 ENCOUNTER — Encounter (HOSPITAL_COMMUNITY): Payer: Self-pay | Admitting: Urology

## 2020-08-20 LAB — SURGICAL PATHOLOGY

## 2020-08-26 DIAGNOSIS — R339 Retention of urine, unspecified: Secondary | ICD-10-CM | POA: Diagnosis not present

## 2020-09-10 DIAGNOSIS — M6289 Other specified disorders of muscle: Secondary | ICD-10-CM | POA: Diagnosis not present

## 2020-09-10 DIAGNOSIS — N3946 Mixed incontinence: Secondary | ICD-10-CM | POA: Diagnosis not present

## 2020-09-10 DIAGNOSIS — C61 Malignant neoplasm of prostate: Secondary | ICD-10-CM | POA: Diagnosis not present

## 2020-09-10 DIAGNOSIS — M62838 Other muscle spasm: Secondary | ICD-10-CM | POA: Diagnosis not present

## 2020-09-10 DIAGNOSIS — N320 Bladder-neck obstruction: Secondary | ICD-10-CM | POA: Diagnosis not present

## 2020-09-10 DIAGNOSIS — M6281 Muscle weakness (generalized): Secondary | ICD-10-CM | POA: Diagnosis not present

## 2020-09-17 DIAGNOSIS — M62838 Other muscle spasm: Secondary | ICD-10-CM | POA: Diagnosis not present

## 2020-09-17 DIAGNOSIS — M6281 Muscle weakness (generalized): Secondary | ICD-10-CM | POA: Diagnosis not present

## 2020-09-17 DIAGNOSIS — R351 Nocturia: Secondary | ICD-10-CM | POA: Diagnosis not present

## 2020-09-17 DIAGNOSIS — N3946 Mixed incontinence: Secondary | ICD-10-CM | POA: Diagnosis not present

## 2020-09-21 ENCOUNTER — Other Ambulatory Visit: Payer: Self-pay | Admitting: Cardiology

## 2020-10-22 ENCOUNTER — Other Ambulatory Visit: Payer: Self-pay | Admitting: Cardiology

## 2020-10-22 DIAGNOSIS — I10 Essential (primary) hypertension: Secondary | ICD-10-CM

## 2020-10-22 DIAGNOSIS — E785 Hyperlipidemia, unspecified: Secondary | ICD-10-CM

## 2020-10-22 DIAGNOSIS — I2510 Atherosclerotic heart disease of native coronary artery without angina pectoris: Secondary | ICD-10-CM

## 2020-12-03 ENCOUNTER — Other Ambulatory Visit (HOSPITAL_COMMUNITY): Payer: Self-pay | Admitting: Urology

## 2020-12-03 DIAGNOSIS — R9721 Rising PSA following treatment for malignant neoplasm of prostate: Secondary | ICD-10-CM

## 2021-01-04 ENCOUNTER — Other Ambulatory Visit: Payer: Self-pay | Admitting: Cardiology

## 2021-01-05 ENCOUNTER — Ambulatory Visit: Payer: Medicare Other | Admitting: Cardiology

## 2021-01-07 ENCOUNTER — Ambulatory Visit: Payer: Medicare Other | Admitting: Cardiology

## 2021-01-25 NOTE — Progress Notes (Signed)
HPI: FU coronary artery disease. Patient had a myocardial infarction in July of 2000. He was treated with TPA and had stenting of his LAD and PTCA of his second diagonal. Abdominal ultrasound in April of 2014 showed no aneurysm. Nuclear study May 2017 showed ejection fraction of 42%, inferoseptal ischemia and a prior anterior infarct with peri-infarct ischemia. Cardiac catheterization June 2017 showed a 50% proximal right coronary artery, 45% proximal to distal LAD and 50% circumflex. Ejection fraction 45-50%.Most recent echocardiogram February 2020 showed ejection fraction 40 to 45%, mild left ventricular hypertrophy, grade 1 diastolic dysfunction. Patient admitted February 2020 with volume excess. He had an episode of atrial fibrillation as well but was not anticoagulated. Since last seen,the patient has dyspnea with more extreme activities but not with routine activities. It is relieved with rest. It is not associated with chest pain. There is no orthopnea, PND or pedal edema. There is no syncope or palpitations. There is no exertional chest pain.   Current Outpatient Medications  Medication Sig Dispense Refill  . aspirin EC 81 MG tablet Take 81 mg by mouth 2 (two) times daily after a meal.    . cholecalciferol (VITAMIN D3) 25 MCG (1000 UNIT) tablet Take 1,000 Units by mouth daily.    . furosemide (LASIX) 20 MG tablet Take 20 mg by mouth as needed for fluid or edema.    Marland Kitchen lisinopril (ZESTRIL) 10 MG tablet TAKE ONE TABLET BY MOUTH ONE TIME DAILY 90 tablet 2  . metoprolol tartrate (LOPRESSOR) 50 MG tablet TAKE ONE TABLET BY MOUTH TWICE DAILY 180 tablet 3  . Multiple Vitamins-Minerals (CENTRUM SILVER ULTRA MENS PO) Take 1 tablet by mouth daily.     . nitroGLYCERIN (NITROSTAT) 0.4 MG SL tablet Place 1 tablet (0.4 mg total) under the tongue every 5 (five) minutes as needed. (Patient taking differently: Place 0.4 mg under the tongue every 5 (five) minutes as needed for chest pain.) 25 tablet  12  . pravastatin (PRAVACHOL) 20 MG tablet TAKE ONE TABLET BY MOUTH ONE TIME DAILY 90 tablet 0  . vitamin B-12 (CYANOCOBALAMIN) 1000 MCG tablet Take 1,000 mcg by mouth daily.    . Coenzyme Q10 (CO Q 10) 100 MG CAPS Take 100 mg by mouth daily.  (Patient not taking: Reported on 01/28/2021)     No current facility-administered medications for this visit.     Past Medical History:  Diagnosis Date  . AKI (acute kidney injury) ([place]) 11/12/2018  . CAD (coronary artery disease)   . CHF (congestive heart failure) ([place])   . Dyspnea   . Foley catheter in place   . HTN (hypertension)   . Hyperlipidemia   . Myocardial infarction ([place]) 2000   REPORTS HE HAS HAD 5 HEART ATTACKS BUT SOME OF THE "WERE NOT SERIOUS"   . Obesity   . Prostate cancer [facility])     Past Surgical History:  Procedure Laterality Date  . CARDIAC CATHETERIZATION N/A 03/09/2016   Procedure: Left Heart Cath and Coronary Angiography;  Surgeon: Belva Crome, MD;  Location: [place] [facility];  Service: Cardiovascular;  Laterality: N/A;  . CYSTOSCOPY W/ RETROGRADES Bilateral 08/19/2020   Procedure: BILATERAL RETROGRADE PYELOGRAM;  Surgeon: Ardis Hughs, MD;  Location: [facility];  Service: Urology;  Laterality: Bilateral;  . RADIATION TO PROSTATE    . Stenting of LAD  2000  . TONSILLECTOMY    . TRANSURETHRAL RESECTION OF PROSTATE N/A 03/01/2019   Procedure: TRANSURETHRAL RESECTION OF THE PROSTATE (TURP);  Surgeon: Ardis Hughs, MD;  Location: [facility];  Service: Urology;  Laterality: N/A;  75 MINS  . TRANSURETHRAL RESECTION OF PROSTATE N/A 08/19/2020   Procedure: TRANSURETHRAL RESECTION OF THE PROSTATE (TURP);  Surgeon: Ardis Hughs, MD;  Location: [facility];  Service: Urology;  Laterality: N/A;    Social History   Socioeconomic History  . Marital status: Married    Spouse name: Not on file  . Number of children: Not on file  . Years of education: Not on file  . Highest education level: Not on file  Occupational  History  . Not on file  Tobacco Use  . Smoking status: Former Smoker    Quit date: 1964    Years since quitting: 58.3  . Smokeless tobacco: Never Used  Vaping Use  . Vaping Use: Never used  Substance and Sexual Activity  . Alcohol use: Yes    Comment: [employer]CC [employer]   . Drug use: Not Currently  . Sexual activity: Not on file  Other Topics Concern  . Not on file  Social History Narrative  . Not on file   Social Determinants of Health   Financial Resource Strain: Not on file  Food Insecurity: Not on file  Transportation Needs: Not on file  Physical Activity: Not on file  Stress: Not on file  Social Connections: Not on file  Intimate Partner Violence: Not on file    Family History  Problem Relation Age of Onset  . Hypertension Father     ROS: no fevers or chills, productive cough, hemoptysis, dysphasia, odynophagia, melena, hematochezia, dysuria, hematuria, rash, seizure activity, orthopnea, PND, pedal edema, claudication. Remaining systems are negative.  Physical Exam: Well-developed well-nourished in no acute distress.  Skin is warm and dry.  HEENT is normal.  Neck is supple.  Chest is clear to auscultation with normal expansion.  Cardiovascular exam is regular rate and rhythm.  Abdominal exam nontender or distended. No masses palpated. Extremities show no edema. neuro grossly intact  ECG-sinus bradycardia at a rate of 52, right bundle branch block, left anterior fascicular block, cannot rule out septal infarct.  Personally reviewed  A/P  1 coronary artery disease-patient denies chest pain.  Continue medical therapy with aspirin and statin.  2 history of paroxysmal atrial fibrillation-patient is in sinus rhythm today.  He previously declined anticoagulation.  He understands the higher risk of CVA off of anticoagulation.  3 ischemic cardiomyopathy-we will continue ACE inhibitor and beta-blocker.  4 hyperlipidemia-continue statin.  Note he did not tolerate  higher doses previously.  Check lipids and liver.  5 hypertension-blood pressure controlled.  Continue present medications.  Check potassium and renal function.  Kirk Ruths, MD

## 2021-01-27 ENCOUNTER — Other Ambulatory Visit: Payer: Self-pay | Admitting: Cardiology

## 2021-01-27 DIAGNOSIS — I2510 Atherosclerotic heart disease of native coronary artery without angina pectoris: Secondary | ICD-10-CM

## 2021-01-27 DIAGNOSIS — E785 Hyperlipidemia, unspecified: Secondary | ICD-10-CM

## 2021-01-27 DIAGNOSIS — I10 Essential (primary) hypertension: Secondary | ICD-10-CM

## 2021-01-28 ENCOUNTER — Ambulatory Visit (INDEPENDENT_AMBULATORY_CARE_PROVIDER_SITE_OTHER): Payer: Medicare Other | Admitting: Cardiology

## 2021-01-28 ENCOUNTER — Encounter: Payer: Self-pay | Admitting: Cardiology

## 2021-01-28 ENCOUNTER — Other Ambulatory Visit: Payer: Self-pay

## 2021-01-28 VITALS — BP 110/60 | HR 52 | Ht 72.0 in | Wt 180.0 lb

## 2021-01-28 DIAGNOSIS — I10 Essential (primary) hypertension: Secondary | ICD-10-CM | POA: Diagnosis not present

## 2021-01-28 DIAGNOSIS — E7800 Pure hypercholesterolemia, unspecified: Secondary | ICD-10-CM

## 2021-01-28 DIAGNOSIS — I2510 Atherosclerotic heart disease of native coronary artery without angina pectoris: Secondary | ICD-10-CM | POA: Diagnosis not present

## 2021-01-28 LAB — COMPREHENSIVE METABOLIC PANEL
ALT: 13 IU/L (ref 0–44)
AST: 15 IU/L (ref 0–40)
Albumin/Globulin Ratio: 1.5 (ref 1.2–2.2)
Albumin: 4 g/dL (ref 3.6–4.6)
Alkaline Phosphatase: 58 IU/L (ref 44–121)
BUN/Creatinine Ratio: 32 — ABNORMAL HIGH (ref 10–24)
BUN: 30 mg/dL — ABNORMAL HIGH (ref 8–27)
Bilirubin Total: 0.3 mg/dL (ref 0.0–1.2)
CO2: 25 mmol/L (ref 20–29)
Calcium: 9.2 mg/dL (ref 8.6–10.2)
Chloride: 102 mmol/L (ref 96–106)
Creatinine, Ser: 0.94 mg/dL (ref 0.76–1.27)
Globulin, Total: 2.6 g/dL (ref 1.5–4.5)
Glucose: 89 mg/dL (ref 65–99)
Potassium: 4.5 mmol/L (ref 3.5–5.2)
Sodium: 139 mmol/L (ref 134–144)
Total Protein: 6.6 g/dL (ref 6.0–8.5)
eGFR: 80 mL/min/{1.73_m2} (ref >59)

## 2021-01-28 LAB — LIPID PANEL
Chol/HDL Ratio: 3 ratio (ref 0.0–5.0)
Cholesterol, Total: 171 mg/dL (ref 100–199)
HDL: 57 mg/dL (ref >39)
LDL Chol Calc (NIH): 92 mg/dL (ref 0–99)
Triglycerides: 125 mg/dL (ref 0–149)
VLDL Cholesterol Cal: 22 mg/dL (ref 5–40)

## 2021-01-28 NOTE — Patient Instructions (Signed)

## 2021-01-29 ENCOUNTER — Encounter: Payer: Self-pay | Admitting: *Deleted

## 2021-02-01 ENCOUNTER — Other Ambulatory Visit: Payer: Self-pay

## 2021-02-01 ENCOUNTER — Ambulatory Visit (HOSPITAL_COMMUNITY)
Admission: RE | Admit: 2021-02-01 | Discharge: 2021-02-01 | Disposition: A | Discharge status: Home / Self Care | Payer: Medicare Other | Source: Ambulatory Visit | Attending: Urology | Admitting: Urology

## 2021-02-01 DIAGNOSIS — R9721 Rising PSA following treatment for malignant neoplasm of prostate: Secondary | ICD-10-CM | POA: Insufficient documentation

## 2021-02-01 MED ORDER — PIFLIFOLASTAT F 18 (PYLARIFY) INJECTION
9.0000 | Freq: Once | INTRAVENOUS | Status: AC
  Administered 2021-02-01: 9.13 via INTRAVENOUS

## 2021-02-25 ENCOUNTER — Encounter: Payer: Self-pay | Admitting: Radiation Oncology

## 2021-02-25 NOTE — Progress Notes (Signed)
Histology and Location of Primary Cancer: Biochemical recurrent prostatic adenocarcinoma diagnosed in 2003, gleason 8  Sites of Visceral and Bony Metastatic Disease: Bony and visceral  Location(s) of Symptomatic Metastases: T12 and left seminal vesicle  Past/Anticipated chemotherapy by medical oncology, if any: ADT x 3 years  Pain on a scale of 0-10 is: Patient denies pain.   If Spine Met(s), symptoms, if any, include:  Bowel/Bladder retention or incontinence (please describe): Yes, incontinence requiring 2-3 ppd. Also, patient has script for penile clamp to aid in the management of incontinence  Numbness or weakness in extremities (please describe): Denies numbness or tingling of lower extremities.  Current Decadron regimen, if applicable: no  Ambulatory status? Walker? Wheelchair?: Ambulatory  SAFETY ISSUES:  Prior radiation? yes, with Dr. Sharen Counter in [facility]  Pacemaker/ICD? no, has stents  Possible current pregnancy? no, male patient  Is the patient on methotrexate? no  Current Complaints / other details:  87 year old male. Married. Resides in [place]. Denies dysuria. Reports occasional hematuria when he has to strain to void. Reports longevity of family members with most living into their 49s. Denies a family hx of cancer. Patient reports he moved to [place] to be closer to his brother after his divorce. Patient reports he has since remarried.

## 2021-02-26 ENCOUNTER — Encounter: Payer: Self-pay | Admitting: Radiation Oncology

## 2021-02-26 ENCOUNTER — Ambulatory Visit
Admission: RE | Admit: 2021-02-26 | Discharge: 2021-02-26 | Disposition: A | Discharge status: Home / Self Care | Payer: Medicare Other | Source: Ambulatory Visit | Attending: Radiation Oncology | Admitting: Radiation Oncology

## 2021-02-26 ENCOUNTER — Other Ambulatory Visit: Payer: Self-pay

## 2021-02-26 ENCOUNTER — Encounter: Payer: Self-pay | Admitting: Medical Oncology

## 2021-02-26 VITALS — BP 106/56 | HR 47 | Temp 96.7°F | Resp 18 | Ht 72.0 in | Wt 181.2 lb

## 2021-02-26 DIAGNOSIS — Z923 Personal history of irradiation: Secondary | ICD-10-CM | POA: Insufficient documentation

## 2021-02-26 DIAGNOSIS — C7952 Secondary malignant neoplasm of bone marrow: Secondary | ICD-10-CM

## 2021-02-26 DIAGNOSIS — Z7982 Long term (current) use of aspirin: Secondary | ICD-10-CM | POA: Diagnosis not present

## 2021-02-26 DIAGNOSIS — I509 Heart failure, unspecified: Secondary | ICD-10-CM | POA: Diagnosis not present

## 2021-02-26 DIAGNOSIS — I10 Essential (primary) hypertension: Secondary | ICD-10-CM | POA: Insufficient documentation

## 2021-02-26 DIAGNOSIS — Z79899 Other long term (current) drug therapy: Secondary | ICD-10-CM | POA: Diagnosis not present

## 2021-02-26 DIAGNOSIS — Z87891 Personal history of nicotine dependence: Secondary | ICD-10-CM | POA: Insufficient documentation

## 2021-02-26 DIAGNOSIS — E785 Hyperlipidemia, unspecified: Secondary | ICD-10-CM | POA: Insufficient documentation

## 2021-02-26 DIAGNOSIS — E669 Obesity, unspecified: Secondary | ICD-10-CM | POA: Diagnosis not present

## 2021-02-26 DIAGNOSIS — C7951 Secondary malignant neoplasm of bone: Secondary | ICD-10-CM | POA: Insufficient documentation

## 2021-02-26 DIAGNOSIS — I252 Old myocardial infarction: Secondary | ICD-10-CM | POA: Insufficient documentation

## 2021-02-26 DIAGNOSIS — C61 Malignant neoplasm of prostate: Secondary | ICD-10-CM | POA: Insufficient documentation

## 2021-02-26 DIAGNOSIS — I2510 Atherosclerotic heart disease of native coronary artery without angina pectoris: Secondary | ICD-10-CM | POA: Insufficient documentation

## 2021-02-26 NOTE — Progress Notes (Signed)
Introdcued myself to patient as the prostate nurse navigator and discussed my role. He was diagnosed with prostate cancer in 2003 and received radiation. In 2019 had a recurrence and was treated with ADT. 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 He is here to discuss his radiation treatment options. I encouraged him to call me with questions or concerns. He voiced understanding.

## 2021-02-26 NOTE — Progress Notes (Signed)
Radiation Oncology         [phone]) [phone] ________________________________  Initial outpatient Consultation  Name: SEVAG SHEARN MRN: [id number]  Date of Service: 02/26/2021 DOB: January 01, 1934  CC:Christain Sacramento, MD  Ardis Hughs, MD   REFERRING PHYSICIAN: Ardis Hughs, MD  DIAGNOSIS: 87 y/o male with oligometastatic prostate cancer with osseous metastatic disease involving T12 as well as the left seminal vesicle, status post definitive radiotherapy concurrent with ADT in 2003 for treatment of Gleason 4+4 adenocarcinoma of the prostate.    ICD-10-CM   1. Secondary malignant neoplasm of bone and bone marrow (HCC)  C79.51    C79.52   2. Malignant neoplasm of prostate metastatic to bone [facility])  C61    C79.51     HISTORY OF PRESENT ILLNESS: ZACKRY DEINES is a 87 y.o. male seen at the request of Dr. Louis Meckel.  He was initially diagnosed with Gleason 4+4 adenocarcinoma of the prostate in 2003 and treated with definitive radiotherapy concurrent with ADT x3 years with Dr. Sharen Counter in [place].  He has had a rising PSA since at least 2011 when the PSA was noted at 1.58.  In 2019, he had a bone scan as part of his restaging which showed abnormal uptake at a right sixth rib, suspicious for oligometastatic disease but the patient reports that he had a known injury in that area as a teenager so this was likely associated with the previous trauma.  Due to urinary obstruction, he had a TURP on 03/01/2019 which confirmed recurrent Gleason 4+4 adenocarcinoma of the prostate involving 95% of the resected tissue.  A PSA in September 2020 was 6.55 and increased to 13.60 in March 2021 but had decreased to 11.8 in September 2021.  He underwent a repeat TURP procedure on 08/19/2020 which now demonstrated Gleason 4+5 adenocarcinoma of the prostate in approximately 80% of the resected tissue.  He has had significant urinary incontinence since the time of his most recent TUR procedure in 2021 requiring the  use of approximately 4 Depends per day.  His PSA was 4.58 on 10/06/2020 but increased to 7.01 when repeated on 12/30/2020.  A PSMA PET scan was performed on 02/01/2021 showing thickened tissue in the expected location of the left seminal vesicle with high radiotracer activity, concerning for recurrent prostate carcinoma as well as a solitary radiotracer avid skeletal metastasis at T12.  There was no evidence of metastatic lymphadenopathy in the pelvis or retroperitoneum.  He has been kindly referred to [place] today to discuss potential salvage radiotherapy to the oligometastatic disease noted on recent imaging.  PREVIOUS RADIATION THERAPY: Yes  2003: Definitive prostate radiotherapy concurrent with ADT with Dr. Sharen Counter in [place]:  Past Medical History:  Diagnosis Date  . AKI (acute kidney injury) ([place]) 11/12/2018  . CAD (coronary artery disease)   . CHF (congestive heart failure) ([place])   . Dyspnea   . Foley catheter in place   . HTN (hypertension)   . Hyperlipidemia   . Myocardial infarction ([place]) 2000   REPORTS HE HAS HAD 5 HEART ATTACKS BUT SOME OF THE "WERE NOT SERIOUS"   . Obesity   . Prostate cancer ([place])       PAST SURGICAL HISTORY: Past Surgical History:  Procedure Laterality Date  . CARDIAC CATHETERIZATION N/A 03/09/2016   Procedure: Left Heart Cath and Coronary Angiography;  Surgeon: Belva Crome, MD;  Location: [facility];  Service: Cardiovascular;  Laterality: N/A;  . CYSTOSCOPY  W/ RETROGRADES Bilateral 08/19/2020   Procedure: BILATERAL RETROGRADE PYELOGRAM;  Surgeon: Ardis Hughs, MD;  Location: [facility];  Service: Urology;  Laterality: Bilateral;  . RADIATION TO PROSTATE    . Stenting of LAD  2000  . TONSILLECTOMY    . TRANSURETHRAL RESECTION OF PROSTATE N/A 03/01/2019   Procedure: TRANSURETHRAL RESECTION OF THE PROSTATE (TURP);  Surgeon: Ardis Hughs, MD;  Location: [facility];  Service: Urology;  Laterality: N/A;  75 MINS  .  TRANSURETHRAL RESECTION OF PROSTATE N/A 08/19/2020   Procedure: TRANSURETHRAL RESECTION OF THE PROSTATE (TURP);  Surgeon: Ardis Hughs, MD;  Location: [facility];  Service: Urology;  Laterality: N/A;    FAMILY HISTORY:  Family History  Problem Relation Age of Onset  . Hypertension Father   . Breast cancer Neg Hx   . Prostate cancer Neg Hx   . Colon cancer Neg Hx   . Pancreatic cancer Neg Hx     SOCIAL HISTORY:  Social History   Socioeconomic History  . Marital status: Married    Spouse name: Not on file  . Number of children: Not on file  . Years of education: Not on file  . Highest education level: Not on file  Occupational History    Comment: retired  Tobacco Use  . Smoking status: Former Smoker    Packs/day: 0.50    Years: 7.00    Pack years: 3.50    Types: Cigarettes    Quit date: 10/03/1962    Years since quitting: 58.4  . Smokeless tobacco: Never Used  Vaping Use  . Vaping Use: Never used  Substance and Sexual Activity  . Alcohol use: Yes    Comment: OCC BEER AND WINE   . Drug use: Not Currently  . Sexual activity: Not Currently  Other Topics Concern  . Not on file  Social History Narrative   Moved from [facility] to [place], [place] following divorce to be closer to his brother. Patient has remarried.   Social Determinants of Health   Financial Resource Strain: Not on file  Food Insecurity: Not on file  Transportation Needs: Not on file  Physical Activity: Not on file  Stress: Not on file  Social Connections: Not on file  Intimate Partner Violence: Not on file    ALLERGIES: Bactrim [sulfamethoxazole-trimethoprim]  MEDICATIONS:  Current Outpatient Medications  Medication Sig Dispense Refill  . aspirin EC 81 MG tablet Take 81 mg by mouth 2 (two) times daily after a meal.    . cholecalciferol (VITAMIN D3) 25 MCG (1000 UNIT) tablet Take 1,000 Units by mouth daily.    Marland Kitchen docusate sodium (COLACE) 100 MG capsule Take 100 mg by mouth daily. One tablet every other  day.    . lisinopril (ZESTRIL) 10 MG tablet TAKE ONE TABLET BY MOUTH ONE TIME DAILY 90 tablet 2  . metoprolol tartrate (LOPRESSOR) 50 MG tablet TAKE ONE TABLET BY MOUTH TWICE DAILY 180 tablet 3  . Multiple Vitamins-Minerals (CENTRUM SILVER ULTRA MENS PO) Take 1 tablet by mouth daily.     . pravastatin (PRAVACHOL) 20 MG tablet TAKE ONE TABLET BY MOUTH ONE TIME DAILY 90 tablet 0  . vitamin B-12 (CYANOCOBALAMIN) 1000 MCG tablet Take 1,000 mcg by mouth daily.    . furosemide (LASIX) 20 MG tablet Take 20 mg by mouth as needed for fluid or edema. (Patient not taking: Reported on 02/26/2021)    . nitroGLYCERIN (NITROSTAT) 0.4 MG SL tablet Place 1 tablet (0.4 mg total) under the tongue every  5 (five) minutes as needed. (Patient not taking: Reported on 02/26/2021) 25 tablet 12   No current facility-administered medications for this encounter.    REVIEW OF SYSTEMS:  On review of systems, the patient reports that he is doing well overall.  He denies any chest pain, shortness of breath, cough, fevers, chills, night sweats, unintended weight changes.  He denies any bowel or bladder disturbances, and denies abdominal pain, nausea or vomiting.  He denies any new musculoskeletal or joint aches or pains.  His current IPSS score is 14, indicating moderate urinary symptoms with nocturia x3 as well as a weak flow of stream, urgency and significant urinary incontinence requiring the use of 3-4 Depends undergarments per day.  His SHIM score was 18, indicating moderate erectile dysfunction.  A complete review of systems is obtained and is otherwise negative.    PHYSICAL EXAM:  Wt Readings from Last 3 Encounters:  02/26/21 181 lb 4 oz (82.2 kg)  01/28/21 180 lb (81.6 kg)  08/19/20 186 lb 6.4 oz (84.5 kg)   Temp Readings from Last 3 Encounters:  02/26/21 (!) 96.7 F (35.9 C) (Temporal)  08/19/20 97.6 F (36.4 C)  08/10/20 97.7 F (36.5 C) (Oral)   BP Readings from Last 3 Encounters:  02/26/21 (!) 106/56   01/28/21 110/60  08/19/20 (!) 107/53   Pulse Readings from Last 3 Encounters:  02/26/21 (!) 47  01/28/21 (!) 52  08/19/20 (!) 53   Pain Assessment Pain Score: 0-No pain/10  In general this is a well appearing Caucasian male in no acute distress.  He is alert and oriented x4 and appropriate throughout the examination. HEENT reveals that the patient is normocephalic, atraumatic. EOMs are intact. PERRLA. Skin is intact without any evidence of gross lesions. Cardiopulmonary assessment is negative for acute distress and he exhibits normal effort. The abdomen is soft, non tender, non distended. Lower extremities are negative for pretibial pitting edema, deep calf tenderness, cyanosis or clubbing.   KPS = 100  100 - Normal; no complaints; no evidence of disease. 90   - Able to carry on normal activity; minor signs or symptoms of disease. 80   - Normal activity with effort; some signs or symptoms of disease. 73   - Cares for self; unable to carry on normal activity or to do active work. 60   - Requires occasional assistance, but is able to care for most of his personal needs. 50   - Requires considerable assistance and frequent medical care. 49   - Disabled; requires special care and assistance. 54   - Severely disabled; hospital admission is indicated although death not imminent. 107   - Very sick; hospital admission necessary; active supportive treatment necessary. 10   - Moribund; fatal processes progressing rapidly. 0     - Dead  Karnofsky DA, Abelmann Atlas, Craver LS and Burchenal [facility] [phone]) The use of the nitrogen mustards in the palliative treatment of carcinoma: with particular reference to bronchogenic carcinoma Cancer 1 634-56  LABORATORY DATA:  Lab Results  Component Value Date   WBC 5.9 08/10/2020   HGB 11.6 (L) 08/10/2020   HCT 34.7 (L) 08/10/2020   MCV 99.4 08/10/2020   PLT 238 08/10/2020   Lab Results  Component Value Date   NA 139 01/28/2021   K 4.5 01/28/2021   CL  102 01/28/2021   CO2 25 01/28/2021   Lab Results  Component Value Date   ALT 13 01/28/2021   AST 15 01/28/2021   ALKPHOS  58 01/28/2021   BILITOT 0.3 01/28/2021     RADIOGRAPHY: NM PET (F18-PYLARIFY) SKULL TO MID THIGH  Result Date: 02/01/2021 CLINICAL DATA:  Prostate carcinoma with biochemical recurrence. PSA equal 4.6. Trans urethral resection of prostate carcinoma 08/19/2020. Status post radiation therapy EXAM: NUCLEAR MEDICINE PET SKULL BASE TO THIGH TECHNIQUE: 9.1 mCi F18 Piflufolastat (Pylarify) was injected intravenously. Full-ring PET imaging was performed from the skull base to thigh after the radiotracer. CT data was obtained and used for attenuation correction and anatomic localization. COMPARISON:  CT  07/30/2020, bone scan 12/12/2017 FINDINGS: NECK No radiotracer activity in neck lymph nodes. Incidental CT finding: None CHEST No radiotracer accumulation within mediastinal or hilar lymph nodes. No suspicious pulmonary nodules on the CT scan. Incidental CT finding: None ABDOMEN/PELVIS Prostate: Post prostatectomy. There is intense radiotracer activity associated with the residual seminal vesicle tissue. There is asymmetry with thickening of the LEFT seminal vesicle to 14 mm (image 210). There is intense metabolic activity associated thicken tissue with SUV max equal 12.4. Lymph nodes: No abnormal radiotracer accumulation within pelvic or abdominal nodes. Liver: No evidence of liver metastasis Incidental CT finding: None SKELETON Single focal radiotracer avid lesion in the T12 vertebral body with SUV max equal 4.7. There is an accompanying small sclerotic lesion at this level measuring 5 mm (image 133/series 4) IMPRESSION: 1. Thickened tissue in the expected location of the LEFT seminal vesicle has high radiotracer activity and concerning for recurrent prostate carcinoma. 2. No radiotracer avid adenopathy in the pelvis or retroperitoneum. 3. Solitary radiotracer avid SKELETAL METASTASIS at T12.  Electronically Signed   By: Suzy Bouchard M.D.   On: 02/01/2021 17:10      IMPRESSION/PLAN: 1. 87 y.o. male with oligometastatic prostate cancer with osseous metastatic disease at T12 as well as involvement in the left seminal vesicle, status post definitive radiotherapy concurrent with ADT in 2003 for treatment of Gleason 4+4 adenocarcinoma of the prostate. Today, we talked to the patient about the findings and workup thus far. We discussed the natural history of oligometastatic adenocarcinoma of the prostate and general treatment, highlighting the role of salvage radiotherapy in the management. We discussed the available radiation techniques, and focused on the details and logistics of delivery.  The recommendation is for a 5 fraction course of stereotactic body radiotherapy (SBRT) targeting the known disease at T12 and in the left seminal vesicle.  We reviewed the anticipated acute and late sequelae associated with radiation in this setting. The patient was encouraged to ask questions that were answered to his satisfaction.  At the conclusion of our conversation, the patient is in agreement to proceed with the recommended 5 fraction course of SBRT to treat the known disease at T12 and in the left seminal vesicle.  He appears to have a good understanding of his disease and our treatment recommendations which are of curative intent.  We will share our discussion with Dr. Louis Meckel and move forward with treatment planning accordingly.  He has freely signed written consent to proceed today in the office and a copy of this document has been placed in his medical record.  He is tentatively scheduled for CT simulation at 9:30 AM on Thursday, 03/04/2021 in anticipation of proceeding with treatments beginning around 03/10/2021.  We enjoyed meeting him today and look forward to continuing to participate in his care.  We personally spent 75 minutes in this encounter including chart review, reviewing radiological  studies, meeting face-to-face with the patient, entering orders and completing documentation.  Nicholos Johns, [name]    Tyler Pita, MD  [facility] Direct Dial: [phone]  Fax: [phone] [email or web address]  Skype  LinkedIn

## 2021-03-04 ENCOUNTER — Ambulatory Visit
Admission: RE | Admit: 2021-03-04 | Discharge: 2021-03-04 | Disposition: A | Discharge status: Home / Self Care | Payer: Medicare Other | Source: Ambulatory Visit | Attending: Radiation Oncology | Admitting: Radiation Oncology

## 2021-03-04 DIAGNOSIS — Z510 Encounter for antineoplastic radiation therapy: Secondary | ICD-10-CM | POA: Insufficient documentation

## 2021-03-04 DIAGNOSIS — C7951 Secondary malignant neoplasm of bone: Secondary | ICD-10-CM | POA: Insufficient documentation

## 2021-03-04 DIAGNOSIS — C61 Malignant neoplasm of prostate: Secondary | ICD-10-CM | POA: Diagnosis present

## 2021-03-04 NOTE — Progress Notes (Signed)
  Radiation Oncology         (336) [phone] ________________________________  Name: Ronald Kaiser MRN: [id number]  Date: 03/04/2021  DOB: 1934-07-27  STEREOTACTIC BODY RADIOTHERAPY SIMULATION AND TREATMENT PLANNING NOTE    ICD-10-CM   1. Malignant neoplasm of prostate metastatic to bone [facility])  C61    C79.51     DIAGNOSIS:   87 y/o male with oligometastatic prostate cancer with osseous metastatic disease involving T12 as well as the left seminal vesicle, status post definitive radiotherapy concurrent with ADT in 2003 for treatment of Gleason 4+4 adenocarcinoma of the prostate.  NARRATIVE:  The patient was brought to the [facility].  Identity was confirmed.  All relevant records and images related to the planned course of therapy were reviewed.  The patient freely provided informed written consent to proceed with treatment after reviewing the details related to the planned course of therapy. The consent form was witnessed and verified by the simulation staff.  Then, the patient was set-up in a stable reproducible  supine position for radiation therapy.  A BodyFix immobilization pillow was fabricated for reproducible positioning.  Surface markings were placed.  The CT images were loaded into the planning software.  The gross target volumes (GTV) and planning target volumes (PTV) were delinieated, and avoidance structures were contoured.  Treatment planning then occurred.  The radiation prescription was entered and confirmed.  A total of two complex treatment devices were fabricated in the form of the BodyFix immobilization pillow and a neck accuform cushion.  I have requested : 3D Simulation  I have requested a DVH of the following structures: targets and all normal structures near the target including kidneys, bowel, spinal cord, skin, bladder, rectum and others as noted on the radiation plan to maintain doses in adherence with established limits  SPECIAL TREATMENT PROCEDURE:  The  planned course of therapy using radiation constitutes a special treatment procedure. Special care is required in the management of this patient for the following reasons. High dose per fraction requiring special monitoring for increased toxicities of treatment including daily imaging..  The special nature of the planned course of radiotherapy will require increased physician supervision and oversight to ensure patient's safety with optimal treatment outcomes.  PLAN:  The patient will receive 50 Gy in 5 fractions to T12 and the left seminal vesicle.  ________________________________  Sheral Apley. Tammi Klippel, M.D.

## 2021-03-09 DIAGNOSIS — Z510 Encounter for antineoplastic radiation therapy: Secondary | ICD-10-CM | POA: Diagnosis not present

## 2021-03-16 ENCOUNTER — Ambulatory Visit
Admission: RE | Admit: 2021-03-16 | Discharge: 2021-03-16 | Disposition: A | Discharge status: Home / Self Care | Payer: Medicare Other | Source: Ambulatory Visit | Attending: Radiation Oncology | Admitting: Radiation Oncology

## 2021-03-16 ENCOUNTER — Other Ambulatory Visit: Payer: Self-pay

## 2021-03-16 DIAGNOSIS — Z510 Encounter for antineoplastic radiation therapy: Secondary | ICD-10-CM | POA: Diagnosis not present

## 2021-03-17 ENCOUNTER — Ambulatory Visit: Payer: Medicare Other

## 2021-03-18 ENCOUNTER — Ambulatory Visit
Admission: RE | Admit: 2021-03-18 | Discharge: 2021-03-18 | Disposition: A | Discharge status: Home / Self Care | Payer: Medicare Other | Source: Ambulatory Visit | Attending: Radiation Oncology | Admitting: Radiation Oncology

## 2021-03-18 ENCOUNTER — Other Ambulatory Visit: Payer: Self-pay

## 2021-03-18 DIAGNOSIS — Z510 Encounter for antineoplastic radiation therapy: Secondary | ICD-10-CM | POA: Diagnosis not present

## 2021-03-19 ENCOUNTER — Ambulatory Visit: Payer: Medicare Other

## 2021-03-22 ENCOUNTER — Ambulatory Visit
Admission: RE | Admit: 2021-03-22 | Discharge: 2021-03-22 | Disposition: A | Discharge status: Home / Self Care | Payer: Medicare Other | Source: Ambulatory Visit | Attending: Radiation Oncology | Admitting: Radiation Oncology

## 2021-03-22 ENCOUNTER — Other Ambulatory Visit: Payer: Self-pay

## 2021-03-22 DIAGNOSIS — Z510 Encounter for antineoplastic radiation therapy: Secondary | ICD-10-CM | POA: Diagnosis not present

## 2021-03-24 ENCOUNTER — Ambulatory Visit
Admission: RE | Admit: 2021-03-24 | Discharge: 2021-03-24 | Disposition: A | Discharge status: Home / Self Care | Payer: Medicare Other | Source: Ambulatory Visit | Attending: Radiation Oncology | Admitting: Radiation Oncology

## 2021-03-24 ENCOUNTER — Other Ambulatory Visit: Payer: Self-pay

## 2021-03-24 DIAGNOSIS — Z510 Encounter for antineoplastic radiation therapy: Secondary | ICD-10-CM | POA: Diagnosis not present

## 2021-03-26 ENCOUNTER — Other Ambulatory Visit: Payer: Self-pay | Admitting: Cardiology

## 2021-03-26 ENCOUNTER — Encounter: Payer: Self-pay | Admitting: Urology

## 2021-03-26 ENCOUNTER — Other Ambulatory Visit: Payer: Self-pay

## 2021-03-26 ENCOUNTER — Ambulatory Visit
Admission: RE | Admit: 2021-03-26 | Discharge: 2021-03-26 | Disposition: A | Discharge status: Home / Self Care | Payer: Medicare Other | Source: Ambulatory Visit | Attending: Radiation Oncology | Admitting: Radiation Oncology

## 2021-03-26 DIAGNOSIS — C7951 Secondary malignant neoplasm of bone: Secondary | ICD-10-CM

## 2021-03-26 DIAGNOSIS — C61 Malignant neoplasm of prostate: Secondary | ICD-10-CM

## 2021-03-26 DIAGNOSIS — Z510 Encounter for antineoplastic radiation therapy: Secondary | ICD-10-CM | POA: Diagnosis not present

## 2021-04-03 ENCOUNTER — Other Ambulatory Visit: Payer: Self-pay | Admitting: Cardiology

## 2021-05-14 NOTE — Progress Notes (Signed)
Patient states no pain, fatigue, dysuria, hematuria, frequency, or skin problems. Patient is experiencing some urgency, a weak stream, mild diarrhea and occasional constipation. Patient does wear an adult brief and tries to keep a close eye on urination/ bowel habits for accuracy of information.  I-PSS score of 10.  Meaningful use questions complete and patient notified of his 9:00am telephone appointment on 05/19/21 and expressed an understanding of it.

## 2021-05-19 ENCOUNTER — Ambulatory Visit
Admission: RE | Admit: 2021-05-19 | Discharge: 2021-05-19 | Disposition: A | Discharge status: Home / Self Care | Payer: Medicare Other | Source: Ambulatory Visit | Attending: Urology | Admitting: Urology

## 2021-05-19 DIAGNOSIS — C7951 Secondary malignant neoplasm of bone: Secondary | ICD-10-CM

## 2021-05-19 NOTE — Progress Notes (Signed)
Radiation Oncology         (336) [phone] ________________________________  Name: Ronald Kaiser MRN: [id number]  Date: 05/19/2021  DOB: 31-Jan-1934  Post Treatment Note  CC: Christain Sacramento, MD  Christain Sacramento, MD  Diagnosis:   87 y/o male with oligometastatic prostate cancer with osseous metastatic disease involving T12 as well as the left seminal vesicle, status post definitive radiotherapy concurrent with ADT in 2003 for treatment of Gleason 4+4 adenocarcinoma of the prostate.  Interval Since Last Radiation:  7.5 weeks  03/16/21 - 03/26/21:  The targets at T12 and left seminal vesicle were treated to 50 Gy in 5 fractions of 10 Gy  2003: Definitive prostate radiotherapy concurrent with ADT with Dr. Sharen Counter in [facility]  Narrative:  I attempted to call the patient to conduct his routine scheduled 1 month follow up visit via telephone to spare the patient unnecessary potential exposure in the healthcare setting during the current COVID-19 pandemic.  The patient was notified in advance and gave permission to proceed with this visit format.  Unfortunately, when I called, there was no answer at either of the numbers listed in his chart, so I left a detailed message on his voicemail.  He tolerated radiation treatment relatively well without any ill side effects.  His chronic LUTS remained persistent but were not exacerbated.  During the course of treatment he continued with a weak flow of stream, intermittency, hesitancy and nocturia 2-3 times per night.  He denied any abdominal pain or bowel issues.                              On review of systems, the patient states that he has no pain, fatigue, dysuria, gross hematuria, excessive frequency, or skin problems.  He is experiencing some urgency, a weak stream, mild diarrhea and occasional constipation which are all chronic in nature and not exacerbated. Patient does wear an adult brief for protection.  Overall, he is quite pleased with his progress  to date.  ALLERGIES:  is allergic to bactrim [sulfamethoxazole-trimethoprim].  Meds: Current Outpatient Medications  Medication Sig Dispense Refill   aspirin EC 81 MG tablet Take 81 mg by mouth 2 (two) times daily after a meal.     docusate sodium (COLACE) 100 MG capsule Take 100 mg by mouth daily. One tablet every other day.     lisinopril (ZESTRIL) 10 MG tablet TAKE ONE TABLET BY MOUTH ONE TIME DAILY 90 tablet 0   metoprolol tartrate (LOPRESSOR) 50 MG tablet TAKE ONE TABLET BY MOUTH TWICE DAILY 180 tablet 3   Multiple Vitamins-Minerals (CENTRUM SILVER ULTRA MENS PO) Take 1 tablet by mouth daily.      pravastatin (PRAVACHOL) 20 MG tablet Take 1 tablet (20 mg total) by mouth daily. 90 tablet 3   cholecalciferol (VITAMIN D3) 25 MCG (1000 UNIT) tablet Take 1,000 Units by mouth daily. (Patient not taking: Reported on 05/14/2021)     furosemide (LASIX) 20 MG tablet Take 20 mg by mouth as needed for fluid or edema. (Patient not taking: Reported on 05/14/2021)     nitroGLYCERIN (NITROSTAT) 0.4 MG SL tablet Place 1 tablet (0.4 mg total) under the tongue every 5 (five) minutes as needed. (Patient not taking: No sig reported) 25 tablet 12   vitamin B-12 (CYANOCOBALAMIN) 1000 MCG tablet Take 1,000 mcg by mouth daily. (Patient not taking: Reported on 05/14/2021)     No current facility-administered medications for this  encounter.    Physical Findings:  vitals were not taken for this visit.  Pain Assessment Pain Score: 0-No pain/10 Unable to assess due to telephone follow-up visit format.  Lab Findings: Lab Results  Component Value Date   WBC 5.9 08/10/2020   HGB 11.6 (L) 08/10/2020   HCT 34.7 (L) 08/10/2020   MCV 99.4 08/10/2020   PLT 238 08/10/2020     Radiographic Findings: No results found.  Impression/Plan: 1. 87 y/o male with oligometastatic prostate cancer with osseous metastatic disease involving T12 as well as the left seminal vesicle, status post definitive radiotherapy  concurrent with ADT in 2003 for treatment of Gleason 4+4 adenocarcinoma of the prostate. He will continue to follow up with urology for ongoing PSA determinations with Dr. Louis Meckel.  I left him a detailed message on his voicemail requesting that he return my call if he has any questions or concerns but that otherwise, I will look forward to following his response to treatment via correspondence with urology, and would be happy to continue to participate in his care if clinically indicated. I encouraged him to call or return to the office if he has any questions regarding his previous radiation or possible radiation side effects.      Nicholos Johns, [name]

## 2021-05-19 NOTE — Progress Notes (Signed)
  Radiation Oncology         [phone]) [phone] ________________________________  Name: Ronald Kaiser MRN: [id number]  Date: 03/26/2021  DOB: 1933-11-19  End of Treatment Note  Diagnosis:   87 y/o male with oligometastatic prostate cancer with osseous metastatic disease involving T12 as well as the left seminal vesicle, status post definitive radiotherapy concurrent with ADT in 2003 for treatment of Gleason 4+4 adenocarcinoma of the prostate.     Indication for treatment:  Curative, Definitive SBRT       Radiation treatment dates:   03/16/21 - 03/26/21  Site/dose:   The targets at T12 and left seminal vesicle were treated to 50 Gy in 5 fractions of 10 Gy  Beams/energy:   The patient was treated using stereotactic body radiotherapy according to a 3D conformal radiotherapy plan.  Volumetric arc fields were employed to deliver 6 MV X-rays.  Image guidance was performed with per fraction cone beam CT prior to treatment under personal MD supervision.  Immobilization was achieved using BodyFix Pillow.  Narrative: The patient tolerated radiation treatment relatively well without any ill side effects.  His chronic LUTS remained persistent but were not exacerbated.  During the course of treatment he continued with a weak flow of stream, intermittency, hesitancy and nocturia 2-3 times per night.  He denied any abdominal pain or bowel issues.  Plan: The patient has completed radiation treatment. The patient will return to radiation oncology clinic for routine followup in one month. I advised them to call or return sooner if they have any questions or concerns related to their recovery or treatment. ________________________________  Sheral Apley. Tammi Klippel, M.D.

## 2021-06-29 ENCOUNTER — Other Ambulatory Visit: Payer: Self-pay | Admitting: Cardiology

## 2021-10-30 DIAGNOSIS — J22 Unspecified acute lower respiratory infection: Secondary | ICD-10-CM | POA: Insufficient documentation

## 2021-10-30 DIAGNOSIS — J40 Bronchitis, not specified as acute or chronic: Secondary | ICD-10-CM | POA: Insufficient documentation

## 2022-01-19 ENCOUNTER — Other Ambulatory Visit (HOSPITAL_COMMUNITY): Payer: Self-pay | Admitting: Urology

## 2022-01-19 DIAGNOSIS — C61 Malignant neoplasm of prostate: Secondary | ICD-10-CM

## 2022-01-25 ENCOUNTER — Encounter (HOSPITAL_COMMUNITY)
Admission: RE | Admit: 2022-01-25 | Discharge: 2022-01-25 | Disposition: A | Discharge status: Home / Self Care | Payer: Medicare Other | Source: Ambulatory Visit | Attending: Urology | Admitting: Urology

## 2022-01-25 DIAGNOSIS — C61 Malignant neoplasm of prostate: Secondary | ICD-10-CM | POA: Insufficient documentation

## 2022-01-25 MED ORDER — PIFLIFOLASTAT F 18 (PYLARIFY) INJECTION
9.0000 | Freq: Once | INTRAVENOUS | Status: AC
  Administered 2022-01-25: 8.1 via INTRAVENOUS

## 2022-01-29 ENCOUNTER — Other Ambulatory Visit: Payer: Self-pay | Admitting: Cardiology

## 2022-01-29 DIAGNOSIS — I10 Essential (primary) hypertension: Secondary | ICD-10-CM

## 2022-01-29 DIAGNOSIS — E785 Hyperlipidemia, unspecified: Secondary | ICD-10-CM

## 2022-01-29 DIAGNOSIS — I2510 Atherosclerotic heart disease of native coronary artery without angina pectoris: Secondary | ICD-10-CM

## 2022-02-25 NOTE — Progress Notes (Signed)
RN spoke with [facility] regarding request for additional imaging prior to appointment on 6/6.   Per Caryl Pina they are working on getting CT scheduled, and aware to get date prior to 6/6.  RN will follow up next week to ensure of date/scheduling.

## 2022-02-28 LAB — COLOGUARD: COLOGUARD: NEGATIVE

## 2022-03-01 ENCOUNTER — Other Ambulatory Visit: Payer: Self-pay | Admitting: Cardiology

## 2022-03-01 DIAGNOSIS — I10 Essential (primary) hypertension: Secondary | ICD-10-CM

## 2022-03-01 DIAGNOSIS — E785 Hyperlipidemia, unspecified: Secondary | ICD-10-CM

## 2022-03-01 DIAGNOSIS — I2510 Atherosclerotic heart disease of native coronary artery without angina pectoris: Secondary | ICD-10-CM

## 2022-03-03 NOTE — Progress Notes (Signed)
Patient is scheduled for CT scan @ [facility] on 6/2 @ 1:45pm per Caryl Pina at [place].

## 2022-03-03 NOTE — Progress Notes (Signed)
GU Location of Tumor / Histology: Recurrence Prostate Ca  If Prostate Cancer, Gleason Score is (4 + 4 as of 2003) and PSA is (4.55 as of 02/15/2022)      Past/Anticipated interventions by urology, if any:    Past/Anticipated interventions by medical oncology, if any: NA  Weight changes, if any:  Maintaining  IPSS:  14 SHIM:  5  Bowel/Bladder complaints, if any: No  Nausea/Vomiting, if any: No  Pain issues, if any:  0/10  SAFETY ISSUES: Prior radiation?  Yes, 2005-2006 and 2020-2022 Pacemaker/ICD? No Possible current pregnancy? Male Is the patient on methotrexate? No  Current Complaints / other details:

## 2022-03-07 ENCOUNTER — Ambulatory Visit
Admission: RE | Admit: 2022-03-07 | Discharge: 2022-03-07 | Disposition: A | Discharge status: Home / Self Care | Payer: Self-pay | Source: Ambulatory Visit | Attending: Radiation Oncology | Admitting: Radiation Oncology

## 2022-03-07 ENCOUNTER — Other Ambulatory Visit: Payer: Self-pay

## 2022-03-07 DIAGNOSIS — C61 Malignant neoplasm of prostate: Secondary | ICD-10-CM

## 2022-03-08 ENCOUNTER — Ambulatory Visit
Admission: RE | Admit: 2022-03-08 | Discharge: 2022-03-08 | Disposition: A | Discharge status: Home / Self Care | Payer: Medicare Other | Source: Ambulatory Visit | Attending: Radiation Oncology | Admitting: Radiation Oncology

## 2022-03-08 ENCOUNTER — Other Ambulatory Visit: Payer: Self-pay

## 2022-03-08 VITALS — BP 110/60 | HR 60 | Temp 97.8°F | Resp 18 | Ht 72.0 in | Wt 175.6 lb

## 2022-03-08 DIAGNOSIS — I2510 Atherosclerotic heart disease of native coronary artery without angina pectoris: Secondary | ICD-10-CM | POA: Insufficient documentation

## 2022-03-08 DIAGNOSIS — C7951 Secondary malignant neoplasm of bone: Secondary | ICD-10-CM | POA: Insufficient documentation

## 2022-03-08 DIAGNOSIS — I252 Old myocardial infarction: Secondary | ICD-10-CM | POA: Diagnosis not present

## 2022-03-08 DIAGNOSIS — I110 Hypertensive heart disease with heart failure: Secondary | ICD-10-CM | POA: Insufficient documentation

## 2022-03-08 DIAGNOSIS — R32 Unspecified urinary incontinence: Secondary | ICD-10-CM | POA: Diagnosis not present

## 2022-03-08 DIAGNOSIS — Z79899 Other long term (current) drug therapy: Secondary | ICD-10-CM | POA: Insufficient documentation

## 2022-03-08 DIAGNOSIS — E785 Hyperlipidemia, unspecified: Secondary | ICD-10-CM | POA: Diagnosis not present

## 2022-03-08 DIAGNOSIS — I509 Heart failure, unspecified: Secondary | ICD-10-CM | POA: Diagnosis not present

## 2022-03-08 DIAGNOSIS — Z7982 Long term (current) use of aspirin: Secondary | ICD-10-CM | POA: Insufficient documentation

## 2022-03-08 DIAGNOSIS — C61 Malignant neoplasm of prostate: Secondary | ICD-10-CM

## 2022-03-08 NOTE — Progress Notes (Signed)
Radiation Oncology         [phone] ________________________________  Outpatient Re-Consultation  Name: Ronald Kaiser MRN: [id number]  Date of Service: 03/08/2022 DOB: 1933/10/23  CC:Christain Sacramento, MD  Ardis Hughs, MD   REFERRING PHYSICIAN: Ardis Hughs, MD  DIAGNOSIS: 88 yo man with oligometastatic prostate cancer with osseous metastatic disease involving the left pubic ramus, status post definitive radiotherapy concurrent with ADT in 2003 for treatment of Gleason 4+4 adenocarcinoma of the prostate and SBRT to oligometastatic disease in T12 and the left seminal vesicle.    ICD-10-CM   1. Malignant neoplasm of prostate ([place])  C61       HISTORY OF PRESENT ILLNESS: Ronald Kaiser is a 88 y.o. male seen at the request of Dr. Louis Meckel.  He was initially diagnosed with Gleason 4+4 adenocarcinoma of the prostate in 2003 and treated with definitive radiotherapy concurrent with ADT x3 years with Dr. Sharen Counter in [place].  He has had a rising PSA since at least 2011 when the PSA was noted at 1.58.  In 2019, he had a bone scan as part of his restaging which showed abnormal uptake at a right sixth rib, suspicious for oligometastatic disease but the patient reports that he had a known injury in that area as a teenager so this was likely associated with the previous trauma.  Due to urinary obstruction, he had a TURP on 03/01/2019 which confirmed recurrent Gleason 4+4 adenocarcinoma of the prostate involving 95% of the resected tissue.  A PSA in September 2020 was 6.55 and increased to 13.60 in March 2021 but had decreased to 11.8 in September 2021.  He underwent a repeat TURP procedure on 08/19/2020 which now demonstrated Gleason 4+5 adenocarcinoma of the prostate in approximately 80% of the resected tissue.  He has had significant urinary incontinence since the time of his most recent TUR procedure in 2021 requiring the use of approximately 4 Depends per day.  His PSA was 4.58 on  10/06/2020 but increased to 7.01 when repeated on 12/30/2020.  A PSMA PET scan was performed on 02/01/2021 showing thickened tissue in the expected location of the left seminal vesicle with high radiotracer activity, concerning for recurrent prostate carcinoma as well as a solitary radiotracer avid skeletal metastasis at T12.  There was no evidence of metastatic lymphadenopathy in the pelvis or retroperitoneum.  We met the patient on 02/26/2021 to discuss potential salvage radiation therapy and he subsequently completed a 5 fraction course of SBRT to T12 and the left seminal vesicle 03/16/2021 through 03/26/2021. His PSA initially decreased to 3.57 in 07/2021 but has been rising slightly since that time. He had a restaging PSMA PET scan on 01/25/2022 that showed resolution of radiotracer activity of the treated T12 lesion and decreased activity and size of the treated lesion in the left prostatectomy bed/seminal vesicle region.  However, there was a new, intensely radiotracer-avid skeletal metastasis within the inferior left pubic ramus as well as new, indeterminate activity in the right hilum.  His most recent PSA was 4.55 on 02/15/2022.  A CT chest was performed on 03/04/2022 for further evaluation of the right hilar activity and this was without any suspicious lymphadenopathy to correspond to the increased activity in the right hilum and no suspicious pulmonary nodules.  He has been kindly referred to [place] today to discuss potential salvage radiotherapy to the oligometastatic disease in the left pubic ramus noted on recent PSMA PET imaging.  PREVIOUS RADIATION THERAPY: Yes  03/16/21 -  03/26/21: The targets at T12 and left seminal vesicle were treated to 50 Gy in 5 fractions of 10 Gy  2003: Definitive prostate radiotherapy concurrent with ADT with Dr. Sharen Counter in [place]:  Past Medical History:  Diagnosis Date   AKI (acute kidney injury) ([place]) 11/12/2018   CAD (coronary artery disease)     CHF (congestive heart failure) ([facility])    Dyspnea    Foley catheter in place    HTN (hypertension)    Hyperlipidemia    Myocardial infarction ([place]) 2000   REPORTS HE HAS HAD 5 HEART ATTACKS BUT SOME OF THE "WERE NOT SERIOUS"    Obesity    Prostate cancer ([place])       PAST SURGICAL HISTORY: Past Surgical History:  Procedure Laterality Date   CARDIAC CATHETERIZATION N/A 03/09/2016   Procedure: Left Heart Cath and Coronary Angiography;  Surgeon: Belva Crome, MD;  Location: [facility];  Service: Cardiovascular;  Laterality: N/A;   CYSTOSCOPY W/ RETROGRADES Bilateral 08/19/2020   Procedure: BILATERAL RETROGRADE PYELOGRAM;  Surgeon: Ardis Hughs, MD;  Location: [facility];  Service: Urology;  Laterality: Bilateral;   RADIATION TO PROSTATE     Stenting of LAD  2000   TONSILLECTOMY     TRANSURETHRAL RESECTION OF PROSTATE N/A 03/01/2019   Procedure: TRANSURETHRAL RESECTION OF THE PROSTATE (TURP);  Surgeon: Ardis Hughs, MD;  Location: [facility];  Service: Urology;  Laterality: N/A;  75 MINS   TRANSURETHRAL RESECTION OF PROSTATE N/A 08/19/2020   Procedure: TRANSURETHRAL RESECTION OF THE PROSTATE (TURP);  Surgeon: Ardis Hughs, MD;  Location: [facility];  Service: Urology;  Laterality: N/A;    FAMILY HISTORY:  Family History  Problem Relation Age of Onset   Hypertension Father    Breast cancer Neg Hx    Prostate cancer Neg Hx    Colon cancer Neg Hx    Pancreatic cancer Neg Hx     SOCIAL HISTORY:  Social History   Socioeconomic History   Marital status: Married    Spouse name: Not on file   Number of children: Not on file   Years of education: Not on file   Highest education level: Not on file  Occupational History    Comment: retired  Tobacco Use   Smoking status: Former    Packs/day: 0.50    Years: 7.00    Pack years: 3.50    Types: Cigarettes    Quit date: 10/03/1962    Years since quitting: 59.4   Smokeless tobacco: Never  Vaping Use   Vaping Use:  Never used  Substance and Sexual Activity   Alcohol use: Yes    Comment: [employer]    Drug use: Not Currently   Sexual activity: Not Currently  Other Topics Concern   Not on file  Social History Narrative   Moved from [facility] to [place], [place] following divorce to be closer to his brother. Patient has remarried.   Social Determinants of Health   Financial Resource Strain: Not on file  Food Insecurity: Not on file  Transportation Needs: Not on file  Physical Activity: Not on file  Stress: Not on file  Social Connections: Not on file  Intimate Partner Violence: Not on file    ALLERGIES: Bactrim [sulfamethoxazole-trimethoprim] and Amoxicillin-pot clavulanate  MEDICATIONS:  Current Outpatient Medications  Medication Sig Dispense Refill   aspirin 81 MG chewable tablet Chew 1 tablet by mouth daily.     aspirin EC  81 MG tablet Take 81 mg by mouth 2 (two) times daily after a meal.     docusate sodium (COLACE) 100 MG capsule Take 100 mg by mouth daily. One tablet every other day.     furosemide (LASIX) 20 MG tablet Take 20 mg by mouth as needed for fluid or edema. (Patient not taking: Reported on 05/14/2021)     lisinopril (ZESTRIL) 10 MG tablet TAKE ONE TABLET BY MOUTH ONE TIME DAILY 90 tablet 2   metoprolol tartrate (LOPRESSOR) 50 MG tablet TAKE ONE TABLET BY MOUTH TWICE DAILY 60 tablet 0   Multiple Vitamins-Minerals (CENTRUM SILVER ULTRA MENS PO) Take 1 tablet by mouth daily.      nitroGLYCERIN (NITROSTAT) 0.4 MG SL tablet Place 1 tablet (0.4 mg total) under the tongue every 5 (five) minutes as needed. (Patient not taking: No sig reported) 25 tablet 12   pravastatin (PRAVACHOL) 20 MG tablet Take 1 tablet (20 mg total) by mouth daily. 90 tablet 3   vitamin B-12 (CYANOCOBALAMIN) 1000 MCG tablet Take 1,000 mcg by mouth daily. (Patient not taking: Reported on 05/14/2021)     No current facility-administered medications for this encounter.    REVIEW OF SYSTEMS:  On review of systems,  the patient reports that he is doing well overall.  He denies any chest pain, shortness of breath, cough, fevers, chills, night sweats, unintended weight changes.  He denies any bowel or bladder disturbances, and denies abdominal pain, nausea or vomiting.  He denies any new musculoskeletal or joint aches or pains.  His current IPSS score is 14, indicating moderate urinary symptoms; this is stable from his prior consultation a year ago. A complete review of systems is obtained and is otherwise negative.    PHYSICAL EXAM:  Wt Readings from Last 3 Encounters:  03/08/22 175 lb 9.6 oz (79.7 kg)  02/26/21 181 lb 4 oz (82.2 kg)  01/28/21 180 lb (81.6 kg)   Temp Readings from Last 3 Encounters:  03/08/22 97.8 F (36.6 C)  02/26/21 (!) 96.7 F (35.9 C) (Temporal)  08/19/20 97.6 F (36.4 C)   BP Readings from Last 3 Encounters:  03/08/22 110/60  02/26/21 (!) 106/56  01/28/21 110/60   Pulse Readings from Last 3 Encounters:  03/08/22 60  02/26/21 (!) 47  01/28/21 (!) 52   Pain Assessment Pain Score: 0-No pain/10  In general this is a well appearing Caucasian male in no acute distress.  He is alert and oriented x4 and appropriate throughout the examination. HEENT reveals that the patient is normocephalic, atraumatic. EOMs are intact. PERRLA. Skin is intact without any evidence of gross lesions. Cardiopulmonary assessment is negative for acute distress and he exhibits normal effort. The abdomen is soft, non tender, non distended. Lower extremities are negative for pretibial pitting edema, deep calf tenderness, cyanosis or clubbing.   KPS = 100  100 - Normal; no complaints; no evidence of disease. 90   - Able to carry on normal activity; minor signs or symptoms of disease. 80   - Normal activity with effort; some signs or symptoms of disease. 40   - Cares for self; unable to carry on normal activity or to do active work. 60   - Requires occasional assistance, but is able to care for most of  his personal needs. 50   - Requires considerable assistance and frequent medical care. 44   - Disabled; requires special care and assistance. 40   - Severely disabled; hospital admission is indicated although death not  imminent. 33   - Very sick; hospital admission necessary; active supportive treatment necessary. 10   - Moribund; fatal processes progressing rapidly. 0     - Dead  Karnofsky DA, Abelmann [place], Craver LS and Burchenal [facility] [phone]) The use of the nitrogen mustards in the palliative treatment of carcinoma: with particular reference to bronchogenic carcinoma Cancer 1 [id number]  LABORATORY DATA:  Lab Results  Component Value Date   WBC 5.9 08/10/2020   HGB 11.6 (L) 08/10/2020   HCT 34.7 (L) 08/10/2020   MCV 99.4 08/10/2020   PLT 238 08/10/2020   Lab Results  Component Value Date   NA 139 01/28/2021   K 4.5 01/28/2021   CL 102 01/28/2021   CO2 25 01/28/2021   Lab Results  Component Value Date   ALT 13 01/28/2021   AST 15 01/28/2021   ALKPHOS 58 01/28/2021   BILITOT 0.3 01/28/2021     RADIOGRAPHY: No results found.     IMPRESSION/PLAN: 1. 88 y.o. male with oligometastatic prostate cancer with osseous metastatic disease involving the left pubic ramus, status post definitive radiotherapy concurrent with ADT in 2003 for treatment of Gleason 4+4 adenocarcinoma of the prostate and SBRT to oligometastatic disease in T12 and the left seminal vesicle. Today, we talked to the patient about the findings and workup thus far. We discussed the natural history of oligometastatic adenocarcinoma of the prostate and general treatment, highlighting the role of salvage radiotherapy in the management. We discussed the available radiation techniques, and focused on the details and logistics of delivery.  The recommendation is for a 5 fraction course of stereotactic body radiotherapy (SBRT) targeting the known disease in the inferior left pubic ramus.  We reviewed the anticipated acute and late  sequelae associated with radiation in this setting. The patient was encouraged to ask questions that were answered to his satisfaction.  At the conclusion of our conversation, the patient is in agreement to proceed with the recommended 5 fraction course of SBRT to treat the known disease in the inferior pubic ramus.  He appears to have a good understanding of his disease and our treatment recommendations which are of curative intent.  We will share our discussion with Dr. Louis Meckel and move forward with treatment planning accordingly.  He has freely signed written consent to proceed today in the office and a copy of this document has been placed in his medical record.  We will proceed with coordinating for CT simulation, first available, in anticipation of beginning his treatments in the very near future.  We enjoyed meeting with him again today and look forward to continuing to participate in his care.  He knows that he is welcome to call with any questions or concerns in the interim.  We personally spent 60 minutes in this encounter including chart review, reviewing radiological studies, meeting face-to-face with the patient, entering orders and completing documentation.    Nicholos Johns, [name]    Tyler Pita, MD  [facility] Direct Dial: [phone]  Fax: [phone] .com  Skype  LinkedIn   This document serves as a record of services personally performed by Tyler Pita, MD and Freeman Caldron, [name]. It was created on their behalf by Wilburn Mylar, a trained medical scribe. The creation of this record is based on the scribe's personal observations and the provider's statements to them. This document has been checked and approved by the attending provider.

## 2022-03-15 ENCOUNTER — Ambulatory Visit
Admission: RE | Admit: 2022-03-15 | Discharge: 2022-03-15 | Disposition: A | Discharge status: Home / Self Care | Payer: Medicare Other | Source: Ambulatory Visit | Attending: Radiation Oncology | Admitting: Radiation Oncology

## 2022-03-15 ENCOUNTER — Other Ambulatory Visit: Payer: Self-pay

## 2022-03-15 DIAGNOSIS — C7951 Secondary malignant neoplasm of bone: Secondary | ICD-10-CM | POA: Diagnosis present

## 2022-03-15 DIAGNOSIS — C61 Malignant neoplasm of prostate: Secondary | ICD-10-CM | POA: Diagnosis present

## 2022-03-15 DIAGNOSIS — Z510 Encounter for antineoplastic radiation therapy: Secondary | ICD-10-CM | POA: Insufficient documentation

## 2022-03-15 NOTE — Progress Notes (Signed)
  Radiation Oncology         [phone] ________________________________  Name: Ronald Kaiser MRN: [id number]  Date: 03/15/2022  DOB: [birth year]  STEREOTACTIC BODY RADIOTHERAPY SIMULATION AND TREATMENT PLANNING NOTE    ICD-10-CM   1. Malignant neoplasm of prostate metastatic to bone ([place])  C61    C79.51       DIAGNOSIS:  88 yo man with oligometastatic prostate cancer with osseous metastatic disease involving the left pubic ramus  NARRATIVE:  The patient was brought to the [facility].  Identity was confirmed.  All relevant records and images related to the planned course of therapy were reviewed.  The patient freely provided informed written consent to proceed with treatment after reviewing the details related to the planned course of therapy. The consent form was witnessed and verified by the simulation staff.  Then, the patient was set-up in a stable reproducible  supine position for radiation therapy.  A BodyFix immobilization pillow was fabricated for reproducible positioning.  Surface markings were placed.  The CT images were loaded into the planning software.  The gross target volumes (GTV) and planning target volumes (PTV) were delinieated, and avoidance structures were contoured.  Treatment planning then occurred.  The radiation prescription was entered and confirmed.  A total of two complex treatment devices were fabricated in the form of the BodyFix immobilization pillow and a neck accuform cushion.  I have requested : 3D Simulation  I have requested a DVH of the following structures: targets and all normal structures near the target including rectum, bladder, femoral heads and skin as noted on the radiation plan to maintain doses in adherence with established limits  SPECIAL TREATMENT PROCEDURE:  The planned course of therapy using radiation constitutes a special treatment procedure. Special care is required in the management of this patient for the following  reasons. High dose per fraction requiring special monitoring for increased toxicities of treatment including daily imaging..  The special nature of the planned course of radiotherapy will require increased physician supervision and oversight to ensure patient's safety with optimal treatment outcomes.    This requires extended time and effort.    PLAN:  The patient will receive 50 Gy in 5 fractions.  ________________________________  Sheral Apley Tammi Klippel, M.D.

## 2022-03-17 DIAGNOSIS — Z510 Encounter for antineoplastic radiation therapy: Secondary | ICD-10-CM | POA: Diagnosis not present

## 2022-03-28 ENCOUNTER — Other Ambulatory Visit: Payer: Self-pay

## 2022-03-28 ENCOUNTER — Ambulatory Visit
Admission: RE | Admit: 2022-03-28 | Discharge: 2022-03-28 | Disposition: A | Discharge status: Home / Self Care | Payer: Medicare Other | Source: Ambulatory Visit | Attending: Radiation Oncology | Admitting: Radiation Oncology

## 2022-03-28 DIAGNOSIS — C61 Malignant neoplasm of prostate: Secondary | ICD-10-CM

## 2022-03-28 DIAGNOSIS — Z510 Encounter for antineoplastic radiation therapy: Secondary | ICD-10-CM | POA: Diagnosis not present

## 2022-03-28 LAB — RAD ONC ARIA SESSION SUMMARY
Course Elapsed Days: 0
Plan Fractions Treated to Date: 1
Plan Prescribed Dose Per Fraction: 10 Gy
Plan Total Fractions Prescribed: 5
Plan Total Prescribed Dose: 50 Gy
Reference Point Dosage Given to Date: 10 Gy
Reference Point Session Dosage Given: 10 Gy
Session Number: 1

## 2022-03-30 ENCOUNTER — Ambulatory Visit
Admission: RE | Admit: 2022-03-30 | Discharge: 2022-03-30 | Disposition: A | Discharge status: Home / Self Care | Payer: Medicare Other | Source: Ambulatory Visit | Attending: Radiation Oncology | Admitting: Radiation Oncology

## 2022-03-30 ENCOUNTER — Other Ambulatory Visit: Payer: Self-pay

## 2022-03-30 DIAGNOSIS — Z510 Encounter for antineoplastic radiation therapy: Secondary | ICD-10-CM | POA: Diagnosis not present

## 2022-03-30 DIAGNOSIS — C61 Malignant neoplasm of prostate: Secondary | ICD-10-CM

## 2022-03-30 LAB — RAD ONC ARIA SESSION SUMMARY
Course Elapsed Days: 2
Plan Fractions Treated to Date: 2
Plan Prescribed Dose Per Fraction: 10 Gy
Plan Total Fractions Prescribed: 5
Plan Total Prescribed Dose: 50 Gy
Reference Point Dosage Given to Date: 20 Gy
Reference Point Session Dosage Given: 10 Gy
Session Number: 2

## 2022-04-01 ENCOUNTER — Other Ambulatory Visit: Payer: Self-pay

## 2022-04-01 ENCOUNTER — Ambulatory Visit
Admission: RE | Admit: 2022-04-01 | Discharge: 2022-04-01 | Disposition: A | Discharge status: Home / Self Care | Payer: Medicare Other | Source: Ambulatory Visit | Attending: Radiation Oncology | Admitting: Radiation Oncology

## 2022-04-01 ENCOUNTER — Other Ambulatory Visit: Payer: Self-pay | Admitting: Cardiology

## 2022-04-01 ENCOUNTER — Telehealth: Payer: Self-pay | Admitting: Cardiology

## 2022-04-01 DIAGNOSIS — I2510 Atherosclerotic heart disease of native coronary artery without angina pectoris: Secondary | ICD-10-CM

## 2022-04-01 DIAGNOSIS — E785 Hyperlipidemia, unspecified: Secondary | ICD-10-CM

## 2022-04-01 DIAGNOSIS — I10 Essential (primary) hypertension: Secondary | ICD-10-CM

## 2022-04-01 DIAGNOSIS — C7951 Secondary malignant neoplasm of bone: Secondary | ICD-10-CM

## 2022-04-01 DIAGNOSIS — Z510 Encounter for antineoplastic radiation therapy: Secondary | ICD-10-CM | POA: Diagnosis not present

## 2022-04-01 LAB — RAD ONC ARIA SESSION SUMMARY
Course Elapsed Days: 4
Plan Fractions Treated to Date: 3
Plan Prescribed Dose Per Fraction: 10 Gy
Plan Total Fractions Prescribed: 5
Plan Total Prescribed Dose: 50 Gy
Reference Point Dosage Given to Date: 30 Gy
Reference Point Session Dosage Given: 10 Gy
Session Number: 3

## 2022-04-01 NOTE — Telephone Encounter (Signed)
*  STAT* If patient is at the pharmacy, call can be transferred to refill team.   1. Which medications need to be refilled? (please list name of each medication and dose if known) lisinopril (ZESTRIL) 10 MG tablet; metoprolol tartrate (LOPRESSOR) 50 MG tablet; pravastatin (PRAVACHOL) 20 MG tablet  2. Which pharmacy/location (including street and city if local pharmacy) is medication to be sent to? [facility] # [place], [place]  3. Do they need a 30 day or 90 day supply? [place]

## 2022-04-04 ENCOUNTER — Ambulatory Visit: Payer: Medicare Other | Admitting: Radiation Oncology

## 2022-04-04 ENCOUNTER — Other Ambulatory Visit: Payer: Self-pay

## 2022-04-04 ENCOUNTER — Ambulatory Visit
Admission: RE | Admit: 2022-04-04 | Discharge: 2022-04-04 | Disposition: A | Discharge status: Home / Self Care | Payer: Medicare Other | Source: Ambulatory Visit | Attending: Radiation Oncology | Admitting: Radiation Oncology

## 2022-04-04 DIAGNOSIS — C7951 Secondary malignant neoplasm of bone: Secondary | ICD-10-CM | POA: Insufficient documentation

## 2022-04-04 DIAGNOSIS — C61 Malignant neoplasm of prostate: Secondary | ICD-10-CM | POA: Diagnosis not present

## 2022-04-04 LAB — RAD ONC ARIA SESSION SUMMARY
Course Elapsed Days: 7
Plan Fractions Treated to Date: 4
Plan Prescribed Dose Per Fraction: 10 Gy
Plan Total Fractions Prescribed: 5
Plan Total Prescribed Dose: 50 Gy
Reference Point Dosage Given to Date: 40 Gy
Reference Point Session Dosage Given: 10 Gy
Session Number: 4

## 2022-04-05 ENCOUNTER — Ambulatory Visit: Payer: Medicare Other | Admitting: Radiation Oncology

## 2022-04-06 ENCOUNTER — Other Ambulatory Visit: Payer: Self-pay

## 2022-04-06 ENCOUNTER — Ambulatory Visit: Payer: Medicare Other | Admitting: Radiation Oncology

## 2022-04-06 ENCOUNTER — Ambulatory Visit
Admission: RE | Admit: 2022-04-06 | Discharge: 2022-04-06 | Disposition: A | Discharge status: Home / Self Care | Payer: Medicare Other | Source: Ambulatory Visit | Attending: Radiation Oncology | Admitting: Radiation Oncology

## 2022-04-06 DIAGNOSIS — C61 Malignant neoplasm of prostate: Secondary | ICD-10-CM | POA: Diagnosis not present

## 2022-04-06 LAB — RAD ONC ARIA SESSION SUMMARY
Course Elapsed Days: 9
Plan Fractions Treated to Date: 5
Plan Prescribed Dose Per Fraction: 10 Gy
Plan Total Fractions Prescribed: 5
Plan Total Prescribed Dose: 50 Gy
Reference Point Dosage Given to Date: 50 Gy
Reference Point Session Dosage Given: 10 Gy
Session Number: 5

## 2022-04-07 ENCOUNTER — Ambulatory Visit: Payer: Medicare Other | Admitting: Radiation Oncology

## 2022-04-08 ENCOUNTER — Ambulatory Visit: Admission: RE | Admit: 2022-04-08 | Payer: Medicare Other | Source: Ambulatory Visit | Admitting: Radiation Oncology

## 2022-04-11 ENCOUNTER — Ambulatory Visit: Payer: Medicare Other | Admitting: Radiation Oncology

## 2022-04-13 ENCOUNTER — Ambulatory Visit: Admission: RE | Admit: 2022-04-13 | Payer: Medicare Other | Source: Ambulatory Visit | Admitting: Radiation Oncology

## 2022-04-15 ENCOUNTER — Ambulatory Visit: Admission: RE | Admit: 2022-04-15 | Payer: Medicare Other | Source: Ambulatory Visit | Admitting: Radiation Oncology

## 2022-04-18 ENCOUNTER — Ambulatory Visit: Payer: Medicare Other | Admitting: Radiation Oncology

## 2022-05-01 ENCOUNTER — Other Ambulatory Visit: Payer: Self-pay | Admitting: Cardiology

## 2022-05-01 DIAGNOSIS — I10 Essential (primary) hypertension: Secondary | ICD-10-CM

## 2022-05-01 DIAGNOSIS — E785 Hyperlipidemia, unspecified: Secondary | ICD-10-CM

## 2022-05-01 DIAGNOSIS — I2510 Atherosclerotic heart disease of native coronary artery without angina pectoris: Secondary | ICD-10-CM

## 2022-06-13 ENCOUNTER — Other Ambulatory Visit: Payer: Self-pay | Admitting: Cardiology

## 2022-06-13 DIAGNOSIS — I10 Essential (primary) hypertension: Secondary | ICD-10-CM

## 2022-06-13 DIAGNOSIS — E785 Hyperlipidemia, unspecified: Secondary | ICD-10-CM

## 2022-06-13 DIAGNOSIS — I2510 Atherosclerotic heart disease of native coronary artery without angina pectoris: Secondary | ICD-10-CM

## 2022-06-28 IMAGING — CT NM PET TUM IMG SKULL BASE T - THIGH
1 of 7 series · 1 of 25 positions shown · non-contrast
Comparison: CT  07/30/2020, bone scan 12/12/2017

CLINICAL DATA: Prostate carcinoma with biochemical recurrence. PSA
equal 4.6. Trans urethral resection of prostate carcinoma
08/19/2020. Status post radiation therapy

EXAM:
NUCLEAR MEDICINE PET SKULL BASE TO THIGH
TECHNIQUE: 9.1 mCi F18 Piflufolastat (Pylarify) was injected intravenously.
Full-ring PET imaging was performed from the skull base to thigh
after the radiotracer. CT data was obtained and used for attenuation
correction and anatomic localization.

[Series 3: pet sk_thigh ac · axial · 5.0mm · 4.07mm/px · 1 of 256 slices shown]
[im 154/256]
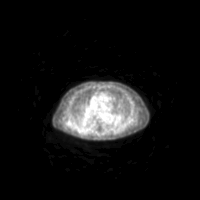

[1 of 25 positions shown; findings below may reference images not displayed]

FINDINGS: NECK

No radiotracer activity in neck lymph nodes.

Incidental CT finding: None

CHEST

No radiotracer accumulation within mediastinal or hilar lymph nodes.
No suspicious pulmonary nodules on the CT scan.

Incidental CT finding: None

ABDOMEN/PELVIS

Prostate: Post prostatectomy. There is intense radiotracer activity
associated with the residual seminal vesicle tissue. There is
asymmetry with thickening of the LEFT seminal vesicle to 14 mm
(image 210). There is intense metabolic activity associated thicken
tissue with SUV max equal 12.4.

Lymph nodes: No abnormal radiotracer accumulation within pelvic or
abdominal nodes.

Liver: No evidence of liver metastasis

Incidental CT finding: None

SKELETON

Single focal radiotracer avid lesion in the T12 vertebral body with
SUV max equal 4.7. There is an accompanying small sclerotic lesion
at this level measuring 5 mm (image 133/series 4)
IMPRESSION: 1. Thickened tissue in the expected location of the LEFT seminal
vesicle has high radiotracer activity and concerning for recurrent
prostate carcinoma.
2. No radiotracer avid adenopathy in the pelvis or retroperitoneum.
3. Solitary radiotracer avid SKELETAL METASTASIS at T12.

## 2022-06-29 ENCOUNTER — Other Ambulatory Visit: Payer: Self-pay | Admitting: Cardiology

## 2022-06-29 NOTE — Progress Notes (Signed)
HPI: FU coronary artery disease. Patient had a myocardial infarction in July of 2000. He was treated with TPA and had stenting of his LAD and PTCA of his second diagonal. Abdominal ultrasound in April of 2014 showed no aneurysm. Nuclear study May 2017 showed ejection fraction of 42%, inferoseptal ischemia and a prior anterior infarct with peri-infarct ischemia. Cardiac catheterization June 2017 showed a 50% proximal right coronary artery, 45% proximal to distal LAD and 50% circumflex. Ejection fraction 45-50%.  Most recent echocardiogram February 2020 showed ejection fraction 40 to 45%, mild left ventricular hypertrophy, grade 1 diastolic dysfunction.  Patient admitted February 2020 with volume excess.  He had an episode of atrial fibrillation as well but was not anticoagulated.  Since last seen, the patient denies any dyspnea on exertion, orthopnea, PND, pedal edema, palpitations, syncope or chest pain.   Current Outpatient Medications  Medication Sig Dispense Refill   aspirin EC 81 MG tablet Take 81 mg by mouth 2 (two) times daily after a meal.     docusate sodium (COLACE) 100 MG capsule Take 100 mg by mouth daily. One tablet every other day.     furosemide (LASIX) 20 MG tablet Take 20 mg by mouth as needed for fluid or edema.     lisinopril (ZESTRIL) 10 MG tablet TAKE ONE TABLET BY MOUTH ONE TIME DAILY 90 tablet 0   metoprolol tartrate (LOPRESSOR) 50 MG tablet TAKE ONE TABLET BY MOUTH TWICE DAILY **Keep scheduled appt w/Cardiologist for future refills** 90 tablet 0   Multiple Vitamins-Minerals (CENTRUM SILVER ULTRA MENS PO) Take 1 tablet by mouth daily.      nitroGLYCERIN (NITROSTAT) 0.4 MG SL tablet Place 1 tablet (0.4 mg total) under the tongue every 5 (five) minutes as needed. 25 tablet 12   pravastatin (PRAVACHOL) 20 MG tablet TAKE ONE TABLET BY MOUTH ONE TIME DAILY 90 tablet 0   vitamin B-12 (CYANOCOBALAMIN) 1000 MCG tablet Take 1,000 mcg by mouth daily.     No current  facility-administered medications for this visit.     Past Medical History:  Diagnosis Date   AKI (acute kidney injury) ([place]) 11/12/2018   CAD (coronary artery disease)    CHF (congestive heart failure) ([facility])    Dyspnea    Foley catheter in place    HTN (hypertension)    Hyperlipidemia    Myocardial infarction ([place]) 2000   REPORTS HE HAS HAD 5 HEART ATTACKS BUT SOME OF THE "WERE NOT SERIOUS"    Obesity    Prostate cancer [facility])     Past Surgical History:  Procedure Laterality Date   CARDIAC CATHETERIZATION N/A 03/09/2016   Procedure: Left Heart Cath and Coronary Angiography;  Surgeon: Belva Crome, MD;  Location: [facility];  Service: Cardiovascular;  Laterality: N/A;   CYSTOSCOPY W/ RETROGRADES Bilateral 08/19/2020   Procedure: BILATERAL RETROGRADE PYELOGRAM;  Surgeon: Ardis Hughs, MD;  Location: [facility];  Service: Urology;  Laterality: Bilateral;   RADIATION TO PROSTATE     Stenting of LAD  2000   TONSILLECTOMY     TRANSURETHRAL RESECTION OF PROSTATE N/A 03/01/2019   Procedure: TRANSURETHRAL RESECTION OF THE PROSTATE (TURP);  Surgeon: Ardis Hughs, MD;  Location: [facility];  Service: Urology;  Laterality: N/A;  75 MINS   TRANSURETHRAL RESECTION OF PROSTATE N/A 08/19/2020   Procedure: TRANSURETHRAL RESECTION OF THE PROSTATE (TURP);  Surgeon: Ardis Hughs, MD;  Location: [facility];  Service: Urology;  Laterality: N/A;    Social History  Socioeconomic History   Marital status: Married    Spouse name: Not on file   Number of children: Not on file   Years of education: Not on file   Highest education level: Not on file  Occupational History    Comment: retired  Tobacco Use   Smoking status: Former    Packs/day: 0.50    Years: 7.00    Total pack years: 3.50    Types: Cigarettes    Quit date: 10/03/1962    Years since quitting: 59.8   Smokeless tobacco: Never  Vaping Use   Vaping Use: Never used  Substance and Sexual Activity   Alcohol use: Yes     Comment: OCC BEER AND WINE    Drug use: Not Currently   Sexual activity: Not Currently  Other Topics Concern   Not on file  Social History Narrative   Moved from [facility] to [place], [place] following divorce to be closer to his brother. Patient has remarried.   Social Determinants of Health   Financial Resource Strain: Not on file  Food Insecurity: Not on file  Transportation Needs: Not on file  Physical Activity: Not on file  Stress: Not on file  Social Connections: Not on file  Intimate Partner Violence: Not on file    Family History  Problem Relation Age of Onset   Hypertension Father    Breast cancer Neg Hx    Prostate cancer Neg Hx    Colon cancer Neg Hx    Pancreatic cancer Neg Hx     ROS: no fevers or chills, productive cough, hemoptysis, dysphasia, odynophagia, melena, hematochezia, dysuria, hematuria, rash, seizure activity, orthopnea, PND, pedal edema, claudication. Remaining systems are negative.  Physical Exam: Well-developed well-nourished in no acute distress.  Skin is warm and dry.  HEENT is normal.  Neck is supple.  Chest is clear to auscultation with normal expansion.  Cardiovascular exam is regular rate and rhythm.  Abdominal exam nontender or distended. No masses palpated. Extremities show no edema. neuro grossly intact  ECG-sinus bradycardia at a rate of 47, right bundle branch block, cannot rule out septal infarct.  Personally reviewed  A/P  1 coronary artery disease-patient doing well from a symptomatic standpoint.  Continue aspirin and statin.  2 paroxysmal atrial fibrillation-patient remains in sinus rhythm.  He declined anticoagulation previously.  3 ischemic cardiomyopathy-continue ACE inhibitor and beta-blocker.  4 hypertension-patient's blood pressure is controlled.  Continue present medical regimen.  Check potassium and renal function.  5 hyperlipidemia-continue statin.  Check lipids and liver.  Kirk Ruths, MD

## 2022-07-12 ENCOUNTER — Ambulatory Visit: Payer: Medicare Other | Attending: Cardiology | Admitting: Cardiology

## 2022-07-12 ENCOUNTER — Encounter: Payer: Self-pay | Admitting: Cardiology

## 2022-07-12 VITALS — BP 110/62 | HR 47 | Ht 72.0 in | Wt 180.0 lb

## 2022-07-12 DIAGNOSIS — I10 Essential (primary) hypertension: Secondary | ICD-10-CM | POA: Diagnosis present

## 2022-07-12 DIAGNOSIS — I2510 Atherosclerotic heart disease of native coronary artery without angina pectoris: Secondary | ICD-10-CM | POA: Diagnosis present

## 2022-07-12 DIAGNOSIS — E7800 Pure hypercholesterolemia, unspecified: Secondary | ICD-10-CM | POA: Diagnosis present

## 2022-07-12 DIAGNOSIS — E785 Hyperlipidemia, unspecified: Secondary | ICD-10-CM | POA: Diagnosis present

## 2022-07-12 LAB — LIPID PANEL
Chol/HDL Ratio: 3 ratio (ref 0.0–5.0)
Cholesterol, Total: 155 mg/dL (ref 100–199)
HDL: 52 mg/dL (ref >39)
LDL Chol Calc (NIH): 83 mg/dL (ref 0–99)
Triglycerides: 109 mg/dL (ref 0–149)
VLDL Cholesterol Cal: 20 mg/dL (ref 5–40)

## 2022-07-12 LAB — COMPREHENSIVE METABOLIC PANEL
ALT: 15 IU/L (ref 0–44)
AST: 19 IU/L (ref 0–40)
Albumin/Globulin Ratio: 1.9 (ref 1.2–2.2)
Albumin: 4 g/dL (ref 3.7–4.7)
Alkaline Phosphatase: 58 IU/L (ref 44–121)
BUN/Creatinine Ratio: 25 — ABNORMAL HIGH (ref 10–24)
BUN: 22 mg/dL (ref 8–27)
Bilirubin Total: 0.4 mg/dL (ref 0.0–1.2)
CO2: 26 mmol/L (ref 20–29)
Calcium: 9 mg/dL (ref 8.6–10.2)
Chloride: 106 mmol/L (ref 96–106)
Creatinine, Ser: 0.88 mg/dL (ref 0.76–1.27)
Globulin, Total: 2.1 g/dL (ref 1.5–4.5)
Glucose: 99 mg/dL (ref 70–99)
Potassium: 4.7 mmol/L (ref 3.5–5.2)
Sodium: 142 mmol/L (ref 134–144)
Total Protein: 6.1 g/dL (ref 6.0–8.5)
eGFR: 84 mL/min/{1.73_m2} (ref >59)

## 2022-07-12 MED ORDER — PRAVASTATIN SODIUM 20 MG PO TABS: 20.0000 mg | ORAL_TABLET | Freq: Every day | ORAL | 3 refills | Status: DC

## 2022-07-12 MED ORDER — METOPROLOL TARTRATE 50 MG PO TABS: ORAL_TABLET | ORAL | 3 refills | Status: DC

## 2022-07-12 MED ORDER — LISINOPRIL 10 MG PO TABS: 10.0000 mg | ORAL_TABLET | Freq: Every day | ORAL | 3 refills | Status: DC

## 2022-07-12 NOTE — Patient Instructions (Signed)
  Follow-Up: At [facility], you and your health needs are our priority.  As part of our continuing mission to provide you with exceptional heart care, we have created designated Provider Care Teams.  These Care Teams include your primary Cardiologist (physician) and Advanced Practice Providers (APPs -  Physician Assistants and Nurse Practitioners) who all work together to provide you with the care you need, when you need it.  We recommend signing up for the patient portal called "MyChart".  Sign up information is provided on this After Visit Summary.  MyChart is used to connect with patients for Virtual Visits (Telemedicine).  Patients are able to view lab/test results, encounter notes, upcoming appointments, etc.  Non-urgent messages can be sent to your provider as well.   To learn more about what you can do with MyChart, go to [email or web address].    Your next appointment:   12 month(s)  The format for your next appointment:   In Person  Provider:   Brian Crenshaw, MD   

## 2022-07-13 NOTE — Addendum Note (Signed)
Addended by: Lubertha Sayres on: 07/13/2022 10:52 AM   Modules accepted: Orders

## 2022-07-15 ENCOUNTER — Telehealth: Payer: Self-pay | Admitting: *Deleted

## 2022-07-15 DIAGNOSIS — E785 Hyperlipidemia, unspecified: Secondary | ICD-10-CM

## 2022-07-15 DIAGNOSIS — E7800 Pure hypercholesterolemia, unspecified: Secondary | ICD-10-CM

## 2022-07-15 MED ORDER — ROSUVASTATIN CALCIUM 20 MG PO TABS: 20.0000 mg | ORAL_TABLET | Freq: Every day | ORAL | 3 refills | Status: DC

## 2022-07-15 NOTE — Telephone Encounter (Signed)
-----   Message from Lelon Perla, MD sent at 07/13/2022  7:49 AM EDT ----- LDL not at goal; DC pravastatin and treat with crestor 20 mg daily if pt willing; lipids and liver 8 weeks Kirk Ruths

## 2022-07-27 ENCOUNTER — Telehealth: Payer: Self-pay | Admitting: Cardiology

## 2022-07-27 NOTE — Telephone Encounter (Signed)
Pt c/o medication issue:  1. Name of Medication:  Pravastatin  rosuvastatin (CRESTOR) 20 MG tablet  2. How are you currently taking this medication (dosage and times per day)? Still taking pravastatin  3. Are you having a reaction (difficulty breathing--STAT)? no  4. What is your medication issue? Patient states e was supposed to be changed from pravastatin to rosuvastatin, but he has not received the prescription in the mail.

## 2022-07-27 NOTE — Telephone Encounter (Signed)
Pt called to report he hasn't received his new prescription in the mail for rosuvastatin.  Per chart review, prescription was mailed out on 10/13. Pt advised to contact office on Monday when MD's nurse return if he don't receive it by Friday. Pt verbalized understanding.

## 2022-08-16 ENCOUNTER — Telehealth: Payer: Self-pay | Admitting: Cardiology

## 2022-08-16 DIAGNOSIS — E785 Hyperlipidemia, unspecified: Secondary | ICD-10-CM

## 2022-08-16 DIAGNOSIS — E7800 Pure hypercholesterolemia, unspecified: Secondary | ICD-10-CM

## 2022-08-16 DIAGNOSIS — Z79899 Other long term (current) drug therapy: Secondary | ICD-10-CM

## 2022-08-16 DIAGNOSIS — I2510 Atherosclerotic heart disease of native coronary artery without angina pectoris: Secondary | ICD-10-CM

## 2022-08-16 MED ORDER — EZETIMIBE 10 MG PO TABS: 10.0000 mg | ORAL_TABLET | Freq: Every day | ORAL | 3 refills | Status: DC

## 2022-08-16 NOTE — Telephone Encounter (Signed)
Pt c/o medication issue:  1. Name of Medication: rosuvastatin (CRESTOR) 20 MG tablet   2. How are you currently taking this medication (dosage and times per day)? Quit taking this medication last Friday   3. Are you having a reaction (difficulty breathing--STAT)? Yes  4. What is your medication issue? This medication is causing back pain, joint pain, constipation and other issues he would like to speak to the nurse about. Requesting call back.

## 2022-08-16 NOTE — Telephone Encounter (Signed)
DC crestor, pravastatin 20 mg daily, add zetia 10 mg daily; lipids and liver 8 weeks Kirk Ruths   Pt informed of providers result & recommendations. Pt verbalized understanding. No further questions. (10-11-2022). He will try and let us know if any side effects.  Future lab entered as well.

## 2022-08-29 ENCOUNTER — Telehealth: Payer: Self-pay | Admitting: Cardiology

## 2022-08-29 NOTE — Telephone Encounter (Signed)
Pt c/o medication issue:  1. Name of Medication:   ezetimibe (ZETIA) 10 MG tablet  Pravastin   2. How are you currently taking this medication (dosage and times per day)?   3. Are you having a reaction (difficulty breathing--STAT)?   4. What is your medication issue? Pt wants to know if he should be taking these both or no. See previous phone note.

## 2022-08-29 NOTE — Telephone Encounter (Signed)
LMTCB

## 2022-08-30 ENCOUNTER — Encounter: Payer: Self-pay | Admitting: *Deleted

## 2022-08-30 ENCOUNTER — Telehealth: Payer: Self-pay | Admitting: Cardiology

## 2022-08-30 MED ORDER — PRAVASTATIN SODIUM 20 MG PO TABS: 20.0000 mg | ORAL_TABLET | Freq: Every evening | ORAL | 3 refills | Status: DC

## 2022-08-30 NOTE — Telephone Encounter (Signed)
Spoke with spouse. Patient at an appointment. Wife stated that after patient was off Crestor for a while, he started Zetia two days ago. Patient began to have back pain and have grogginess and dizziness. Wife stated that patient refuses to take Crestor or Zetia. She said patient would take Pravachol again. Please advise on statins.

## 2022-08-30 NOTE — Telephone Encounter (Signed)
Patient is calling in regards to medication Zetia. Patient said that he does not like it. Please call back

## 2022-08-30 NOTE — Telephone Encounter (Signed)
Spoke with spouse to inform her of the pravastatin '20mg'$  each evening order for patient. Order sent to preferred pharmacy.

## 2022-08-31 NOTE — Telephone Encounter (Signed)
See 11/28 telephone note

## 2022-09-21 LAB — HEPATIC FUNCTION PANEL
ALT: 10 IU/L (ref 0–44)
AST: 15 IU/L (ref 0–40)
Albumin: 4 g/dL (ref 3.7–4.7)
Alkaline Phosphatase: 64 IU/L (ref 44–121)
Bilirubin Total: 0.3 mg/dL (ref 0.0–1.2)
Bilirubin, Direct: <0.1 mg/dL (ref 0.00–0.40)
Total Protein: 6.4 g/dL (ref 6.0–8.5)

## 2022-09-21 LAB — LIPID PANEL
Chol/HDL Ratio: 3.7 ratio (ref 0.0–5.0)
Cholesterol, Total: 187 mg/dL (ref 100–199)
HDL: 50 mg/dL (ref >39)
LDL Chol Calc (NIH): 113 mg/dL — ABNORMAL HIGH (ref 0–99)
Triglycerides: 133 mg/dL (ref 0–149)
VLDL Cholesterol Cal: 24 mg/dL (ref 5–40)

## 2022-10-12 ENCOUNTER — Telehealth: Payer: Self-pay | Admitting: Cardiology

## 2022-10-12 DIAGNOSIS — E7800 Pure hypercholesterolemia, unspecified: Secondary | ICD-10-CM

## 2022-10-12 DIAGNOSIS — E785 Hyperlipidemia, unspecified: Secondary | ICD-10-CM

## 2022-10-12 DIAGNOSIS — Z79899 Other long term (current) drug therapy: Secondary | ICD-10-CM

## 2022-10-12 DIAGNOSIS — I2510 Atherosclerotic heart disease of native coronary artery without angina pectoris: Secondary | ICD-10-CM

## 2022-10-12 NOTE — Telephone Encounter (Signed)
Returned call to patient who states that he has been off of the pravastatin for a while now and wanted to know if he needs to go back on it. According to chart patient was to be taking '20mg'$  pravastatin. Patient stated per recent lipid panel review he was to continue current medications but patient was not taking it. Patient would like to know if he should re start this. Advised I would forward to Dr. Stanford Breed for review. Patient verbalized understanding.

## 2022-10-12 NOTE — Telephone Encounter (Signed)
Pt c/o medication issue:  1. Name of Medication:   pravastatin (PRAVACHOL) 20 MG tablet    2. How are you currently taking this medication (dosage and times per day)?   Take 1 tablet (20 mg total) by mouth every evening.    3. Are you having a reaction (difficulty breathing--STAT)? no  4. What is your medication issue? Calling to see if he needs to start taking the pravastatin again. Please advise

## 2022-10-14 NOTE — Telephone Encounter (Signed)
Spoke with pt, Aware of dr crenshaw's recommendations.  Lab orders mailed to the pt 

## 2022-11-25 ENCOUNTER — Other Ambulatory Visit (HOSPITAL_COMMUNITY): Payer: Self-pay | Admitting: Urology

## 2022-11-25 DIAGNOSIS — R9721 Rising PSA following treatment for malignant neoplasm of prostate: Secondary | ICD-10-CM

## 2022-11-25 DIAGNOSIS — C61 Malignant neoplasm of prostate: Secondary | ICD-10-CM

## 2022-12-13 ENCOUNTER — Encounter (HOSPITAL_COMMUNITY)
Admission: RE | Admit: 2022-12-13 | Discharge: 2022-12-13 | Disposition: A | Discharge status: Home / Self Care | Payer: Medicare Other | Source: Ambulatory Visit | Attending: Urology | Admitting: Urology

## 2022-12-13 DIAGNOSIS — C61 Malignant neoplasm of prostate: Secondary | ICD-10-CM

## 2022-12-13 DIAGNOSIS — R9721 Rising PSA following treatment for malignant neoplasm of prostate: Secondary | ICD-10-CM

## 2022-12-13 MED ORDER — PIFLIFOLASTAT F 18 (PYLARIFY) INJECTION
9.0000 | Freq: Once | INTRAVENOUS | Status: AC
  Administered 2022-12-13: 9.79 via INTRAVENOUS

## 2023-03-01 LAB — LIPID PANEL
Chol/HDL Ratio: 2.9 ratio (ref 0.0–5.0)
Cholesterol, Total: 163 mg/dL (ref 100–199)
HDL: 56 mg/dL (ref >39)
LDL Chol Calc (NIH): 89 mg/dL (ref 0–99)
Triglycerides: 97 mg/dL (ref 0–149)
VLDL Cholesterol Cal: 18 mg/dL (ref 5–40)

## 2023-03-01 LAB — HEPATIC FUNCTION PANEL
ALT: 9 IU/L (ref 0–44)
AST: 20 IU/L (ref 0–40)
Albumin: 3.8 g/dL (ref 3.7–4.7)
Alkaline Phosphatase: 64 IU/L (ref 44–121)
Bilirubin Total: <0.2 mg/dL (ref 0.0–1.2)
Bilirubin, Direct: <0.1 mg/dL (ref 0.00–0.40)
Total Protein: 5.7 g/dL — ABNORMAL LOW (ref 6.0–8.5)

## 2023-07-19 NOTE — Progress Notes (Signed)
HPI: FU coronary artery disease. Patient had a myocardial infarction in July of 2000. He was treated with TPA and had stenting of his LAD and PTCA of his second diagonal. Abdominal ultrasound in April of 2014 showed no aneurysm. Nuclear study May 2017 showed ejection fraction of 42%, inferoseptal ischemia and a prior anterior infarct with peri-infarct ischemia. Cardiac catheterization June 2017 showed a 50% proximal right coronary artery, 45% proximal to distal LAD and 50% circumflex. Ejection fraction 45-50%. Most recent echocardiogram February 2020 showed ejection fraction 40 to 45%, mild left ventricular hypertrophy, grade 1 diastolic dysfunction.  Patient admitted February 2020 with volume excess.  He had an episode of atrial fibrillation as well but was not anticoagulated.  Since last seen, he notes no chest pain, palpitations, increased dyspnea or pedal edema.  He does have some dizziness with standing quickly at times.  Current Outpatient Medications  Medication Sig Dispense Refill   aspirin EC 81 MG tablet Take 81 mg by mouth 2 (two) times daily after a meal.     furosemide (LASIX) 20 MG tablet Take 20 mg by mouth as needed for fluid or edema.     lisinopril (ZESTRIL) 10 MG tablet Take 1 tablet (10 mg total) by mouth daily. 90 tablet 3   metoprolol tartrate (LOPRESSOR) 50 MG tablet TAKE ONE TABLET BY MOUTH TWICE DAILY 60 tablet 0   Multiple Vitamins-Minerals (CENTRUM SILVER ULTRA MENS PO) Take 1 tablet by mouth daily.      pravastatin (PRAVACHOL) 20 MG tablet Take 1 tablet (20 mg total) by mouth every evening. 90 tablet 3   vitamin B-12 (CYANOCOBALAMIN) 1000 MCG tablet Take 1,000 mcg by mouth daily.     docusate sodium (COLACE) 100 MG capsule Take 100 mg by mouth daily. One tablet every other day. (Patient not taking: Reported on 08/01/2023)     nitroGLYCERIN (NITROSTAT) 0.4 MG SL tablet Place 1 tablet (0.4 mg total) under the tongue every 5 (five) minutes as needed. (Patient not taking:  Reported on 08/01/2023) 25 tablet 12   No current facility-administered medications for this visit.     Past Medical History:  Diagnosis Date   AKI (acute kidney injury) ([facility]) 11/12/2018   CAD (coronary artery disease)    CHF (congestive heart failure) ([facility])    Dyspnea    Foley catheter in place    HTN (hypertension)    Hyperlipidemia    Myocardial infarction ([facility]) 2000   REPORTS HE HAS HAD 5 HEART ATTACKS BUT SOME OF THE "WERE NOT SERIOUS"    Obesity    Prostate cancer [facility])     Past Surgical History:  Procedure Laterality Date   CARDIAC CATHETERIZATION N/A 03/09/2016   Procedure: Left Heart Cath and Coronary Angiography;  Surgeon: Lyn Records, MD;  Location: [facility] IN[facility];  Service: Cardiovascular;  Laterality: N/A;   CYSTOSCOPY W/ RETROGRADES Bilateral 08/19/2020   Procedure: BILATERAL RETROGRADE PYELOGRAM;  Surgeon: Crist Fat, MD;  Location: [facility];  Service: Urology;  Laterality: Bilateral;   RADIATION TO PROSTATE     Stenting of LAD  2000   TONSILLECTOMY     TRANSURETHRAL RESECTION OF PROSTATE N/A 03/01/2019   Procedure: TRANSURETHRAL RESECTION OF THE PROSTATE (TURP);  Surgeon: Crist Fat, MD;  Location: [facility];  Service: Urology;  Laterality: N/A;  75 MINS   TRANSURETHRAL RESECTION OF PROSTATE N/A 08/19/2020   Procedure: TRANSURETHRAL RESECTION OF THE PROSTATE (TURP);  Surgeon: Crist Fat, MD;  Location: [facility];  Service: Urology;  Laterality: N/A;    Social History   Socioeconomic History   Marital status: Married    Spouse name: Not on file   Number of children: Not on file   Years of education: Not on file   Highest education level: Not on file  Occupational History    Comment: retired  Tobacco Use   Smoking status: Former    Current packs/day: 0.00    Average packs/day: 0.5 packs/day for 7.0 years (3.5 ttl pk-yrs)    Types: Cigarettes    Start date: 10/04/1955    Quit date: 10/03/1962    Years since quitting: 60.8    Smokeless tobacco: Never  Vaping Use   Vaping status: Never Used  Substance and Sexual Activity   Alcohol use: Yes    Comment: OCC BEER AND WINE    Drug use: Not Currently   Sexual activity: Not Currently  Other Topics Concern   Not on file  Social History Narrative   Moved from [facility] to [place], [place] following divorce to be closer to his brother. Patient has remarried.   Social Determinants of Health   Financial Resource Strain: Not on file  Food Insecurity: Not on file  Transportation Needs: Not on file  Physical Activity: Not on file  Stress: Not on file  Social Connections: Not on file  Intimate Partner Violence: Not on file    Family History  Problem Relation Age of Onset   Hypertension Father    Breast cancer Neg Hx    Prostate cancer Neg Hx    Colon cancer Neg Hx    Pancreatic cancer Neg Hx     ROS: no fevers or chills, productive cough, hemoptysis, dysphasia, odynophagia, melena, hematochezia, dysuria, hematuria, rash, seizure activity, orthopnea, PND, pedal edema, claudication. Remaining systems are negative.  Physical Exam: Well-developed well-nourished in no acute distress.  Skin is warm and dry.  HEENT is normal.  Neck is supple.  Chest is clear to auscultation with normal expansion.  Cardiovascular exam is regular rate and rhythm.  Abdominal exam nontender or distended. No masses palpated. Extremities show no edema. neuro grossly intact  EKG Interpretation Date/Time:  Tuesday August 01 2023 14:30:08 EDT Ventricular Rate:  56 PR Interval:    QRS Duration:  150 QT Interval:  432 QTC Calculation: 416 R Axis:   -72  Text Interpretation: Sinus with pacs Right bundle branch block Left anterior fascicular block Bifascicular block Anteroseptal infarct (cited on or before 09-Mar-2016) When compared with ECG of 11-Nov-2018 21:40, Significant changes have occurred Confirmed by Olga Millers ([id number]) on 08/01/2023 3:30:01 PM    A/P  1 coronary artery  disease-patient denies chest pain.  Continue medical therapy with aspirin and statin.  2 paroxysmal atrial fibrillation-patient is in sinus rhythm today.  He declines anticoagulation as outlined previously.  3 hypertension-blood pressure borderline.  He has some orthostatic symptoms.  Decrease lisinopril from 10 to 2.5 mg daily.  Will also change metoprolol to Toprol 25 mg daily.  Follow blood pressure and adjust regimen as needed.  4 hyperlipidemia-continue pravastatin.  He did not tolerate Crestor or Lipitor.  He also did not tolerate Zetia and declines PCSK9 inhibitors.  5 history of ischemic cardiomyopathy-continue ACE inhibitor and beta-blocker with adjustments in doses as outlined.  Can consider transition to [facility] in the future if LV function deteriorates further.  Will plan repeat echocardiogram.  Olga Millers, MD

## 2023-07-24 ENCOUNTER — Other Ambulatory Visit: Payer: Self-pay | Admitting: Cardiology

## 2023-07-24 DIAGNOSIS — E785 Hyperlipidemia, unspecified: Secondary | ICD-10-CM

## 2023-07-24 DIAGNOSIS — I2510 Atherosclerotic heart disease of native coronary artery without angina pectoris: Secondary | ICD-10-CM

## 2023-07-24 DIAGNOSIS — I10 Essential (primary) hypertension: Secondary | ICD-10-CM

## 2023-08-01 ENCOUNTER — Encounter: Payer: Self-pay | Admitting: Cardiology

## 2023-08-01 ENCOUNTER — Ambulatory Visit: Payer: Medicare Other | Attending: Cardiology | Admitting: Cardiology

## 2023-08-01 VITALS — BP 80/50 | HR 56 | Ht 72.0 in | Wt 160.0 lb

## 2023-08-01 DIAGNOSIS — I509 Heart failure, unspecified: Secondary | ICD-10-CM | POA: Insufficient documentation

## 2023-08-01 DIAGNOSIS — E7800 Pure hypercholesterolemia, unspecified: Secondary | ICD-10-CM | POA: Diagnosis not present

## 2023-08-01 DIAGNOSIS — I2510 Atherosclerotic heart disease of native coronary artery without angina pectoris: Secondary | ICD-10-CM | POA: Insufficient documentation

## 2023-08-01 DIAGNOSIS — I10 Essential (primary) hypertension: Secondary | ICD-10-CM | POA: Diagnosis not present

## 2023-08-01 MED ORDER — LISINOPRIL 2.5 MG PO TABS: 2.5000 mg | ORAL_TABLET | Freq: Every day | ORAL | 3 refills | Status: DC

## 2023-08-01 MED ORDER — METOPROLOL SUCCINATE ER 25 MG PO TB24: 25.0000 mg | ORAL_TABLET | Freq: Every day | ORAL | 3 refills | Status: DC

## 2023-08-01 MED ORDER — PRAVASTATIN SODIUM 20 MG PO TABS: 20.0000 mg | ORAL_TABLET | Freq: Every evening | ORAL | 3 refills | Status: AC

## 2023-08-01 NOTE — Patient Instructions (Signed)
Medication Instructions:  Start taking Lisinopril at new dose of 2.5 mg daily. Stop metropolol and start new dose of metropolol succinate 25 mg daily.  *If you need a refill on your cardiac medications before your next appointment, please call your pharmacy*   Lab Work: FASTING Liver panel, BMET to be completed on day of echo test. If you have labs (blood work) drawn today and your tests are completely normal, you will receive your results only by: MyChart Message (if you have MyChart) OR A paper copy in the mail If you have any lab test that is abnormal or we need to change your treatment, we will call you to review the results.   Testing/Procedures: Your physician has requested that you have an echocardiogram. Echocardiography is a painless test that uses sound waves to create images of your heart. It provides your doctor with information about the size and shape of your heart and how well your heart's chambers and valves are working. This procedure takes approximately one hour. There are no restrictions for this procedure. [address] Please do NOT wear cologne, perfume, aftershave, or lotions (deodorant is allowed). Please arrive 15 minutes prior to your appointment time.    Follow-Up: At [facility], you and your health needs are our priority.  As part of our continuing mission to provide you with exceptional heart care, we have created designated Provider Care Teams.  These Care Teams include your primary Cardiologist (physician) and Advanced Practice Providers (APPs -  Physician Assistants and Nurse Practitioners) who all work together to provide you with the care you need, when you need it.  We recommend signing up for the patient portal called "MyChart".  Sign up information is provided on this After Visit Summary.  MyChart is used to connect with patients for Virtual Visits (Telemedicine).  Patients are able to view lab/test results, encounter notes, upcoming  appointments, etc.  Non-urgent messages can be sent to your provider as well.   To learn more about what you can do with MyChart, go to [email or web address].    Your next appointment:   12 month(s)  Provider:   Olga Millers, MD

## 2023-09-11 ENCOUNTER — Ambulatory Visit (HOSPITAL_COMMUNITY): Payer: Medicare Other | Attending: Cardiology

## 2023-09-11 DIAGNOSIS — E7800 Pure hypercholesterolemia, unspecified: Secondary | ICD-10-CM | POA: Diagnosis not present

## 2023-09-11 DIAGNOSIS — I509 Heart failure, unspecified: Secondary | ICD-10-CM | POA: Insufficient documentation

## 2023-09-11 DIAGNOSIS — I2510 Atherosclerotic heart disease of native coronary artery without angina pectoris: Secondary | ICD-10-CM

## 2023-09-11 LAB — ECHOCARDIOGRAM COMPLETE
Area-P 1/2: 2.76 cm2
P 1/2 time: 551 ms
S' Lateral: 4.1 cm

## 2023-09-11 MED ORDER — PERFLUTREN LIPID MICROSPHERE
1.0000 mL | INTRAVENOUS | Status: AC | PRN
  Administered 2023-09-11: 2 mL via INTRAVENOUS

## 2023-09-12 ENCOUNTER — Telehealth: Payer: Self-pay | Admitting: *Deleted

## 2023-09-12 DIAGNOSIS — Z79899 Other long term (current) drug therapy: Secondary | ICD-10-CM

## 2023-09-12 DIAGNOSIS — I509 Heart failure, unspecified: Secondary | ICD-10-CM

## 2023-09-12 LAB — BASIC METABOLIC PANEL
BUN/Creatinine Ratio: 20 (ref 10–24)
BUN: 17 mg/dL (ref 8–27)
CO2: 24 mmol/L (ref 20–29)
Calcium: 9.2 mg/dL (ref 8.6–10.2)
Chloride: 104 mmol/L (ref 96–106)
Creatinine, Ser: 0.86 mg/dL (ref 0.76–1.27)
Glucose: 99 mg/dL (ref 70–99)
Potassium: 4.7 mmol/L (ref 3.5–5.2)
Sodium: 141 mmol/L (ref 134–144)
eGFR: 84 mL/min/{1.73_m2} (ref >59)

## 2023-09-12 LAB — HEPATIC FUNCTION PANEL
ALT: 13 [IU]/L (ref 0–44)
AST: 22 [IU]/L (ref 0–40)
Albumin: 4.2 g/dL (ref 3.7–4.7)
Alkaline Phosphatase: 76 [IU]/L (ref 44–121)
Bilirubin Total: 0.5 mg/dL (ref 0.0–1.2)
Bilirubin, Direct: 0.19 mg/dL (ref 0.00–0.40)
Total Protein: 6.5 g/dL (ref 6.0–8.5)

## 2023-09-12 MED ORDER — SPIRONOLACTONE 25 MG PO TABS: 12.5000 mg | ORAL_TABLET | Freq: Every day | ORAL | 3 refills | Status: DC

## 2023-09-12 NOTE — Telephone Encounter (Signed)
-----   Message from Olga Millers sent at 09/12/2023  1:07 AM EST ----- LV function worse compared to previous; add spironolactone 12.5 mg daily bmet one week; schedule fu APPov for med titration (discuss change lisinopril to entresto if BP allows and pt willing). Olga Millers

## 2023-09-12 NOTE — Telephone Encounter (Signed)
Spoke with pt, Aware of dr Ludwig Clarks recommendations. New script sent to the pharmacy  Lab orders mailed to the pt  Follow up scheduled

## 2023-09-19 LAB — BASIC METABOLIC PANEL
BUN/Creatinine Ratio: 24 (ref 10–24)
BUN: 20 mg/dL (ref 8–27)
CO2: 26 mmol/L (ref 20–29)
Calcium: 9.5 mg/dL (ref 8.6–10.2)
Chloride: 102 mmol/L (ref 96–106)
Creatinine, Ser: 0.84 mg/dL (ref 0.76–1.27)
Glucose: 96 mg/dL (ref 70–99)
Potassium: 4.9 mmol/L (ref 3.5–5.2)
Sodium: 139 mmol/L (ref 134–144)
eGFR: 84 mL/min/{1.73_m2} (ref >59)

## 2023-10-10 ENCOUNTER — Ambulatory Visit: Payer: Medicare Other | Admitting: Cardiology

## 2023-10-20 NOTE — Progress Notes (Unsigned)
Cardiology Office Note:  .   Date:  10/23/2023  ID:  Ronald Kaiser, DOB 04/07/1934, MRN [id number] PCP: Barbie Banner, MD  [facility] Providers Cardiologist:  Olga Millers, MD  }   History of Present Illness: .   Ronald Kaiser is a 89 y.o. male with hx of CAD, STEMI in 2000 with stent and PTCA of the second diagonal, Cardiac catheterization June 2017 showed a 50% proximal right coronary artery, 45% proximal to distal LAD and 50% circumflex; HTN, PAF HL, ICM on ACE and BB.  Most recent echocardiogram 09/11/2023 EF of 30 to 35%.  When last seen by Dr. Jens Som he was placed on metoprolol succinate 25 mg daily and spironolactone 12.5 mg daily.  The patient states she does not like the new medication changes.  He feels badly on it.  His wife states he is more grumpy.  He is having stiffness, left hip pain and muscle pain on the spironolactone.  He was also unaware that his heart function was reduced.  He denies dizziness, near-syncope, shortness of breath with exertion, or chest pain.  He denies any palpitations.  ROS: As above otherwise negative  Studies Reviewed: .      Echocardiogram 09/11/2023   1. Left ventricular ejection fraction, by estimation, is 30 to 35%. The  left ventricle has moderately decreased function. The left ventricle  demonstrates regional wall motion abnormalities with mid to apical septal  and anterior akinesis, akinesis of the   true apex. No LV thrombus noted (prominent apical trabeculations). Left  ventricular diastolic parameters are consistent with Grade I diastolic  dysfunction (impaired relaxation).   2. Right ventricular systolic function is normal. The right ventricular  size is normal. There is normal pulmonary artery systolic pressure. The  estimated right ventricular systolic pressure is 29.0 mmHg.   3. Left atrial size was mildly dilated.   4. The mitral valve is normal in structure. Mild mitral valve  regurgitation. No evidence of mitral  stenosis.   5. The aortic valve is tricuspid. There is mild calcification of the  aortic valve. Aortic valve regurgitation is trivial. No aortic stenosis is  present.   6. The inferior vena cava is normal in size with greater than 50%  respiratory variability, suggesting right atrial pressure of 3 mmHg.    Physical Exam:   VS:  BP (!) 116/52 (BP Location: Left Arm, Patient Position: Sitting, Cuff Size: Normal)   Pulse 76   Ht 5\' 11"  (1.803 m)   Wt 158 lb 12.8 oz (72 kg)   SpO2 97%   BMI 22.15 kg/m    Wt Readings from Last 3 Encounters:  10/23/23 158 lb 12.8 oz (72 kg)  08/01/23 160 lb (72.6 kg)  07/12/22 180 lb (81.6 kg)    GEN: Well nourished, well developed in no acute distress NECK: No JVD; No carotid bruits CARDIAC: IRRR, no murmurs, rubs, gallops RESPIRATORY:  Clear to auscultation without rales, wheezing or rhonchi  ABDOMEN: Soft, non-tender, non-distended EXTREMITIES:  No edema; No deformity   ASSESSMENT AND PLAN: .    Ischemic cardiomyopathy: LVEF is reduced compared to prior echocardiogram with an EF of 30 to 35% now.  On last office visit he was placed on metoprolol succinate 25 mg daily and spironolactone 12.5 mg daily.  He is not felt well on this medication regimen having muscle aches and pains.  Doubt it was from beta-blocker as he had been on metoprolol for years prior to this time.  I will discontinue spironolactone.  I have explained to him and his heart ejection fraction and its reduction compared to prior echoes.    I will start him on low-dose Entresto 24-26 mg stop the lisinopril, and have given him samples of the Entresto to see if this works for him and does not make him feel badly.  I will see him back in about 3 weeks to see how he is feeling on the medication.  Also, he prefers to take the metoprolol twice a day instead of once a day.  Therefore a returning back to metoprolol tartrate at 25 mg twice daily.  2.  Hypertension: Blood pressure is low normal.   Switching to Entresto and stopping lisinopril and spironolactone will have close follow-up to evaluate his response to medication, and blood pressure.  I have checked his last labs, creatinine 0.84.  Potassium 4.9.  Will likely repeat BMET on next office visit to evaluate his kidney function.  3.  Coronary artery disease: History of prior anterior infarct.  Most recent cardiac cath in June 2017 revealed 2% proximal right coronary artery 40% proximal to distal LAD and 50% circumflex.  Continue secondary prevention with blood pressure control, statin therapy, and purposeful exercise.  4.  Paroxysmal atrial fibrillation: Heart rate is irregular today.  EKG was not completed.  He refuses anticoagulation therapy at this time.  Continue metoprolol tartrate 25 mg twice daily for heart rate control.  5.  Hyperlipidemia: Patient will continue pravastatin.  On next office visit we will follow-up with fasting lipids to evaluate his current status on the pravastatin.  Last office draw was in 03/01/2023 with a total cholesterol 163, LDL 89, HDL 55.           Signed, Bettey Mare. Liborio Nixon, ANP, AACC

## 2023-10-23 ENCOUNTER — Encounter: Payer: Self-pay | Admitting: Adult Health

## 2023-10-23 ENCOUNTER — Ambulatory Visit: Payer: Medicare Other | Attending: Adult Health | Admitting: Adult Health

## 2023-10-23 VITALS — BP 116/52 | HR 76 | Ht 71.0 in | Wt 158.8 lb

## 2023-10-23 DIAGNOSIS — I480 Paroxysmal atrial fibrillation: Secondary | ICD-10-CM | POA: Diagnosis present

## 2023-10-23 DIAGNOSIS — I2510 Atherosclerotic heart disease of native coronary artery without angina pectoris: Secondary | ICD-10-CM | POA: Diagnosis present

## 2023-10-23 DIAGNOSIS — I255 Ischemic cardiomyopathy: Secondary | ICD-10-CM | POA: Diagnosis present

## 2023-10-23 DIAGNOSIS — E7800 Pure hypercholesterolemia, unspecified: Secondary | ICD-10-CM | POA: Diagnosis present

## 2023-10-23 DIAGNOSIS — I10 Essential (primary) hypertension: Secondary | ICD-10-CM | POA: Diagnosis present

## 2023-10-23 MED ORDER — SACUBITRIL-VALSARTAN 24-26 MG PO TABS: 1.0000 | ORAL_TABLET | Freq: Two times a day (BID) | ORAL | Status: DC

## 2023-10-23 MED ORDER — SACUBITRIL-VALSARTAN 24-26 MG PO TABS: 1.0000 | ORAL_TABLET | Freq: Two times a day (BID) | ORAL | 1 refills | Status: DC

## 2023-10-23 MED ORDER — METOPROLOL TARTRATE 25 MG PO TABS: 25.0000 mg | ORAL_TABLET | Freq: Two times a day (BID) | ORAL | 3 refills | Status: DC

## 2023-10-23 NOTE — Patient Instructions (Signed)
Medication Instructions:  Start Entresto 24/26 mg ( Take 1 Tablet Twice daily) Start Metoprolol Tartrate 25 mg ( Take 1 Tablet Twice Daily). Stop Lisinopril. Stop Spironolactone. *If you need a refill on your cardiac medications before your next appointment, please call your pharmacy*   Lab Work: No Labs If you have labs (blood work) drawn today and your tests are completely normal, you will receive your results only by: MyChart Message (if you have MyChart) OR A paper copy in the mail If you have any lab test that is abnormal or we need to change your treatment, we will call you to review the results.   Testing/Procedures: No Testing   Follow-Up: At [facility], you and your health needs are our priority.  As part of our continuing mission to provide you with exceptional heart care, we have created designated Provider Care Teams.  These Care Teams include your primary Cardiologist (physician) and Advanced Practice Providers (APPs -  Physician Assistants and Nurse Practitioners) who all work together to provide you with the care you need, when you need it.  We recommend signing up for the patient portal called "MyChart".  Sign up information is provided on this After Visit Summary.  MyChart is used to connect with patients for Virtual Visits (Telemedicine).  Patients are able to view lab/test results, encounter notes, upcoming appointments, etc.  Non-urgent messages can be sent to your provider as well.   To learn more about what you can do with MyChart, go to [email or web address].    Your next appointment:   3 week(s)  Provider:   Joni Reining, DNP, ANP

## 2023-11-12 NOTE — Progress Notes (Deleted)
  Cardiology Office Note:  .   Date:  11/12/2023  ID:  Ronald Kaiser, DOB 1934/07/28, MRN [id number] PCP: Barbie Banner, MD  [facility] Providers Cardiologist:  Olga Millers, MD  }   History of Present Illness: .   Ronald Kaiser is a 89 y.o. male with hx of CAD, STEMI in 2000 with stent and PTCA of the second diagonal, Cardiac catheterization June 2017 showed a 50% proximal right coronary artery, 45% proximal to distal LAD and 50% circumflex; HTN, PAF HL, ICM on ACE and BB.  Most recent echocardiogram 09/11/2023 EF of 30 to 35%.  I saw him last on 10/23/2023 he was having side effects from his new medications feeling more grumpy having stiffness in his left hip and muscle pain on spironolactone.  He was educated on his low EF as he was unaware.  Spironolactone was discontinued he was started on Entresto 24/26 mg and lisinopril was discontinued.  He was given samples to see how well this works for him.  He continued on metoprolol twice a day but it was changed to metoprolol to tartrate from succinate 25 mg twice daily he is here for quick follow-up to evaluate his response to medications along with BMET evaluation  ROS: ***  Studies Reviewed: .       Echocardiogram 09/11/2023    1. Left ventricular ejection fraction, by estimation, is 30 to 35%. The  left ventricle has moderately decreased function. The left ventricle  demonstrates regional wall motion abnormalities with mid to apical septal  and anterior akinesis, akinesis of the   true apex. No LV thrombus noted (prominent apical trabeculations). Left  ventricular diastolic parameters are consistent with Grade I diastolic  dysfunction (impaired relaxation).   2. Right ventricular systolic function is normal. The right ventricular  size is normal. There is normal pulmonary artery systolic pressure. The  estimated right ventricular systolic pressure is 29.0 mmHg.   3. Left atrial size was mildly dilated.   4. The mitral valve is  normal in structure. Mild mitral valve  regurgitation. No evidence of mitral stenosis.   5. The aortic valve is tricuspid. There is mild calcification of the  aortic valve. Aortic valve regurgitation is trivial. No aortic stenosis is  present.   6. The inferior vena cava is normal in size with greater than 50%  respiratory variability, suggesting right atrial pressure of 3 mmHg.   *** EKG Interpretation Date/Time:    Ventricular Rate:    PR Interval:    QRS Duration:    QT Interval:    QTC Calculation:   R Axis:      Text Interpretation:      Physical Exam:   VS:  There were no vitals taken for this visit.   Wt Readings from Last 3 Encounters:  10/23/23 158 lb 12.8 oz (72 kg)  08/01/23 160 lb (72.6 kg)  07/12/22 180 lb (81.6 kg)    GEN: Well nourished, well developed in no acute distress NECK: No JVD; No carotid bruits CARDIAC: ***RRR, no murmurs, rubs, gallops RESPIRATORY:  Clear to auscultation without rales, wheezing or rhonchi  ABDOMEN: Soft, non-tender, non-distended EXTREMITIES:  No edema; No deformity   ASSESSMENT AND PLAN: .   ***    {Are you ordering a CV Procedure (e.g. stress test, cath, DCCV, TEE, etc)?   Press F2        :[id number]}    Signed, Bettey Mare. Liborio Nixon, ANP, AACC

## 2023-11-14 ENCOUNTER — Ambulatory Visit: Payer: Medicare Other | Admitting: Adult Health

## 2023-11-24 NOTE — Progress Notes (Unsigned)
 Cardiology Clinic Note   Patient Name: Ronald Kaiser Date of Encounter: 11/28/2023  Primary Care Provider:  Barbie Banner, MD Primary Cardiologist:  Olga Millers, MD  Patient Profile    Ronald Kaiser 89 year old male presents to the clinic today for follow-up evaluation of his coronary artery disease, hypertension, and CHF.  Past Medical History    Past Medical History:  Diagnosis Date   AKI (acute kidney injury) (HCC) 11/12/2018   CAD (coronary artery disease)    CHF (congestive heart failure) (HCC)    Dyspnea    Foley catheter in place    HTN (hypertension)    Hyperlipidemia    Myocardial infarction (HCC) 2000   REPORTS HE HAS HAD 5 HEART ATTACKS BUT SOME OF THE "WERE NOT SERIOUS"    Obesity    Prostate cancer [facility])    Past Surgical History:  Procedure Laterality Date   CARDIAC CATHETERIZATION N/A 03/09/2016   Procedure: Left Heart Cath and Coronary Angiography;  Surgeon: Lyn Records, MD;  Location: [facility] INVASIVE CV LAB;  Service: Cardiovascular;  Laterality: N/A;   CYSTOSCOPY W/ RETROGRADES Bilateral 08/19/2020   Procedure: BILATERAL RETROGRADE PYELOGRAM;  Surgeon: Crist Fat, MD;  Location: [facility] ORS;  Service: Urology;  Laterality: Bilateral;   RADIATION TO PROSTATE     Stenting of LAD  2000   TONSILLECTOMY     TRANSURETHRAL RESECTION OF PROSTATE N/A 03/01/2019   Procedure: TRANSURETHRAL RESECTION OF THE PROSTATE (TURP);  Surgeon: Crist Fat, MD;  Location: [facility];  Service: Urology;  Laterality: N/A;  75 MINS   TRANSURETHRAL RESECTION OF PROSTATE N/A 08/19/2020   Procedure: TRANSURETHRAL RESECTION OF THE PROSTATE (TURP);  Surgeon: Crist Fat, MD;  Location: [facility];  Service: Urology;  Laterality: N/A;    Allergies  Allergies  Allergen Reactions   Bactrim [Sulfamethoxazole-Trimethoprim] Hives, Shortness Of Breath and Swelling   Amoxicillin-Pot Clavulanate Diarrhea   Crestor [Rosuvastatin Calcium] Other (See Comments)    Back  pain,grogginess and dizziness   Zetia [Ezetimibe] Other (See Comments)    Back pain, grogginess and dizziness    History of Present Illness    Ronald Kaiser has a PMH of coronary artery disease with STEMI in 2000.  He received PTCA and stenting of his second diagonal.  He underwent repeat cardiac catheterization 6/17 which showed 50% proximal right coronary artery, 45% proximal-distal LAD and 50% circumflex.  His PMH also includes paroxysmal atrial fibrillation, HLD, HTN, and ischemic cardiomyopathy.  His echocardiogram 09/11/2023 showed an EF of 30-35%.  He was seen by Dr. Jens Som in follow-up and was placed on metoprolol 25 mg daily and spironolactone 12.5 mg daily.  He presented for follow-up 10/23/2023 with Joni Reining, DNP.  He reported that he was having trouble with his new medications.  He was feeling bad.  His wife reported that he was more grumpy.  He noted stiffness and left hip pain.  He complained of muscle pain on spironolactone.  He reported that he was unaware that his heart function was reduced.  He denied dizziness, near-syncope, shortness of breath on exertion and chest pain.  He denied palpitations.  His spironolactone was discontinued.  His EF was reviewed.  He was started on Entresto 24-26 and his lisinopril was stopped.  3-week follow-up was planned.  His metoprolol succinate was transition to Toprol all tartrate 25 mg twice daily.  He presents to the clinic today for follow-up evaluation and states he did not tolerate Entresto or  metoprolol succinate.  He is frustrated with the medication regimen.  He shower and attributes it to being on too much blood pressure medication.  He denies chest pain and shortness of breath.  He is euvolemic today.  I will discontinue his metoprolol succinate and Entresto.  We will put him on metoprolol to tartrate 12.5 mg twice daily and lisinopril 2.5 mg daily.  I will order a BMP and 1 week and plan follow-up in 1 to 2 months.  Today he  denies chest pain, shortness of breath, lower extremity edema, fatigue, palpitations, melena, hematuria, hemoptysis, diaphoresis, weakness, presyncope, syncope, orthopnea, and PND.   Home Medications    Prior to Admission medications   Medication Sig Start Date End Date Taking? Authorizing Provider  aspirin EC 81 MG tablet Take 81 mg by mouth 2 (two) times daily after a meal.    [provider]  furosemide (LASIX) 20 MG tablet Take 20 mg by mouth as needed for fluid or edema.    [provider]  metoprolol succinate (TOPROL XL) 25 MG 24 hr tablet Take 1 tablet (25 mg total) by mouth daily. 08/01/23   Lewayne Bunting, MD  metoprolol tartrate (LOPRESSOR) 25 MG tablet Take 1 tablet (25 mg total) by mouth 2 (two) times daily. 10/23/23 01/21/24  Jodelle Gross, NP  Multiple Vitamins-Minerals (CENTRUM SILVER ULTRA MENS PO) Take 1 tablet by mouth daily.     [provider]  nitroGLYCERIN (NITROSTAT) 0.4 MG SL tablet Place 1 tablet (0.4 mg total) under the tongue every 5 (five) minutes as needed. 04/17/17   Lewayne Bunting, MD  pravastatin (PRAVACHOL) 20 MG tablet Take 1 tablet (20 mg total) by mouth every evening. 08/01/23   Lewayne Bunting, MD  sacubitril-valsartan (ENTRESTO) 24-26 MG Take 1 tablet by mouth 2 (two) times daily. 10/23/23   Jodelle Gross, NP  sacubitril-valsartan (ENTRESTO) 24-26 MG Take 1 tablet by mouth 2 (two) times daily. 10/23/23   Jodelle Gross, NP  vitamin B-12 (CYANOCOBALAMIN) 1000 MCG tablet Take 1,000 mcg by mouth daily.    [provider]    Family History    Family History  Problem Relation Age of Onset   Hypertension Father    Breast cancer Neg Hx    Prostate cancer Neg Hx    Colon cancer Neg Hx    Pancreatic cancer Neg Hx    He indicated that his mother is deceased. He indicated that his father is deceased. He indicated that his maternal grandmother is deceased. He indicated that his maternal grandfather is  deceased. He indicated that his paternal grandmother is deceased. He indicated that his paternal grandfather is deceased. He indicated that the status of his neg hx is unknown.  Social History    Social History   Socioeconomic History   Marital status: Married    Spouse name: Not on file   Number of children: Not on file   Years of education: Not on file   Highest education level: Not on file  Occupational History    Comment: retired  Tobacco Use   Smoking status: Former    Current packs/day: 0.00    Average packs/day: 0.5 packs/day for 7.0 years (3.5 ttl pk-yrs)    Types: Cigarettes    Start date: 10/04/1955    Quit date: 10/03/1962    Years since quitting: 61.1   Smokeless tobacco: Never  Vaping Use   Vaping status: Never Used  Substance and Sexual Activity  Alcohol use: Yes    Comment: [employer]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 Patient has remarried.   Social Drivers of Corporate investment banker Strain: Not on file  Food Insecurity: Low Risk  (09/14/2023)   Received from [facility]   Hunger Vital Sign    Worried About Running Out of Food in the Last Year: Never true    Ran Out of Food in the Last Year: Never true  Transportation Needs: No Transportation Needs (09/14/2023)   Received from [facility]    In the past 12 months, has lack of reliable transportation kept you from medical appointments, meetings, work or from getting things needed for daily living? : No  Physical Activity: Not on file  Stress: Not on file  Social Connections: Not on file  Intimate Partner Violence: Not on file     Review of Systems    General:  No chills, fever, night sweats or weight changes.  Cardiovascular:  No chest pain, dyspnea on exertion, edema, orthopnea, palpitations,  paroxysmal nocturnal dyspnea. Dermatological: No rash, lesions/masses Respiratory: No cough, dyspnea Urologic: No hematuria, dysuria Abdominal:   No nausea, vomiting, diarrhea, bright red blood per rectum, melena, or hematemesis Neurologic:  No visual changes, wkns, changes in mental status. All other systems reviewed and are otherwise negative except as noted above.  Physical Exam    VS:  BP (!) 114/54 (BP Location: Left Arm, Patient Position: Sitting, Cuff Size: Normal)   Pulse (!) 44   Ht 6' (1.829 m)   Wt 157 lb 3.2 oz (71.3 kg)   SpO2 93%   BMI 21.32 kg/m  , BMI Body mass index is 21.32 kg/m. GEN: Well nourished, well developed, in no acute distress. HEENT: normal. Neck: Supple, no JVD, carotid bruits, or masses. Cardiac: RRR, no murmurs, rubs, or gallops. No clubbing, cyanosis, edema.  Radials/DP/PT 2+ and equal bilaterally.  Respiratory:  Respirations regular and unlabored, clear to auscultation bilaterally. GI: Soft, nontender, nondistended, BS + x 4. MS: no deformity or atrophy. Skin: warm and dry, no rash. Neuro:  Strength and sensation are intact. Psych: Normal affect.  Accessory Clinical Findings    Recent Labs: 09/11/2023: ALT 13 09/19/2023: BUN 20; Creatinine, Ser 0.84; Potassium 4.9; Sodium 139   Recent Lipid Panel    Component Value Date/Time   CHOL 163 02/28/2023 0838   TRIG 97 02/28/2023 0838   HDL 56 02/28/2023 0838   CHOLHDL 2.9 02/28/2023 0838   CHOLHDL 3.9 01/21/2016 1027   VLDL 31 (H) 01/21/2016 1027   LDLCALC 89 02/28/2023 0838         ECG personally reviewed by me today-none today.    Echocardiogram 09/11/2023  1. Left ventricular ejection fraction, by estimation, is 30 to 35%. The  left ventricle has moderately decreased function. The left ventricle  demonstrates regional wall motion abnormalities with mid to apical septal  and anterior akinesis, akinesis of the   true apex. No LV thrombus noted (prominent apical trabeculations). Left   ventricular diastolic parameters are consistent with Grade I diastolic  dysfunction (impaired relaxation).   2. Right ventricular systolic function is normal. The right ventricular  size is normal. There is normal pulmonary artery systolic pressure. The  estimated right ventricular systolic pressure  is 29.0 mmHg.   3. Left atrial size was mildly dilated.   4. The mitral valve is normal in structure. Mild mitral valve  regurgitation. No evidence of mitral stenosis.   5. The aortic valve is tricuspid. There is mild calcification of the  aortic valve. Aortic valve regurgitation is trivial. No aortic stenosis is  present.   6. The inferior vena cava is normal in size with greater than 50%  respiratory variability, suggesting right atrial pressure of 3 mmHg.       Assessment & Plan   1.  Ischemic cardiomyopathy-weight today 157.2.  No increased DOE or activity intolerance.  Reports tolerating medications better at this time.  EF noted to be 30-35% on echocardiogram 09/11/2023. Start lisinopril 2.5 mg, metoprolol tartrate 12.5 mg twice daily Repeat BMP today and in 1 week Plan for repeat echocardiogram once GDMT has been optimized x 1 month.  Essential hypertension-BP today 114/54. Continue metoprolol, uptitrate Entresto Heart healthy low-sodium diet  Coronary artery disease-denies anginal symptoms.  Has had multiple cardiac catheterizations with most recent 6/17 showing 50% proximal RCA, 45% proximal-distal LAD and 50% circumflex Continue aspirin, metoprolol, pravastatin  Hyperlipidemia-LDL 89 on 02/20/2023. High-fiber diet Continue aspirin, pravastatin  Disposition: Follow-up with Dr. Jens Som or me in 1-2 months.   Thomasene Ripple. Elinor Kleine NP-C     11/28/2023, 2:49 PM [facility] [address] Office [phone] Fax [phone]    I spent 14 minutes examining this patient, reviewing medications, and using patient centered shared decision  making involving their cardiac care.   I spent  20 minutes reviewing past medical history,  medications, and prior cardiac tests.

## 2023-11-28 ENCOUNTER — Ambulatory Visit: Payer: Medicare Other | Attending: General Practice | Admitting: General Practice

## 2023-11-28 ENCOUNTER — Encounter: Payer: Self-pay | Admitting: General Practice

## 2023-11-28 VITALS — BP 114/54 | HR 44 | Ht 72.0 in | Wt 157.2 lb

## 2023-11-28 DIAGNOSIS — I10 Essential (primary) hypertension: Secondary | ICD-10-CM | POA: Diagnosis not present

## 2023-11-28 DIAGNOSIS — I2510 Atherosclerotic heart disease of native coronary artery without angina pectoris: Secondary | ICD-10-CM | POA: Diagnosis present

## 2023-11-28 DIAGNOSIS — I255 Ischemic cardiomyopathy: Secondary | ICD-10-CM | POA: Insufficient documentation

## 2023-11-28 DIAGNOSIS — E785 Hyperlipidemia, unspecified: Secondary | ICD-10-CM | POA: Diagnosis not present

## 2023-11-28 MED ORDER — METOPROLOL TARTRATE 25 MG PO TABS
12.5000 mg | ORAL_TABLET | Freq: Two times a day (BID) | ORAL | 3 refills | Status: DC
Start: 2023-11-28 — End: 2024-05-30

## 2023-11-28 MED ORDER — LISINOPRIL 5 MG PO TABS: 2.5000 mg | ORAL_TABLET | Freq: Every day | ORAL | 3 refills | Status: DC

## 2023-11-28 NOTE — Patient Instructions (Signed)
 Medication Instructions:  STOP METOPROLOL SUCCINATE TAKE METOPROLOL TARTRATE 12.5MG  TWICE DAILY START LISINOPRIL 2.5MG  *If you need a refill on your cardiac medications before your next appointment, please call your pharmacy*  Lab Work: BMET IN 1-2 WEEKS If you have labs (blood work) drawn today and your tests are completely normal, you will receive your results only by:  MyChart Message (if you have MyChart) OR  A paper copy in the mail If you have any lab test that is abnormal or we need to change your treatment, we will call you to review the results.  Follow-Up: At [facility], you and your health needs are our priority.  As part of our continuing mission to provide you with exceptional heart care, we have created designated Provider Care Teams.  These Care Teams include your primary Cardiologist (physician) and Advanced Practice Providers (APPs -  Physician Assistants and Nurse Practitioners) who all work together to provide you with the care you need, when you need it.  Your next appointment:   1-2 month(s)  Provider:   Olga Millers, MD  or Edd Fabian, FNP        Other Instructions PLEASE READ AND FOLLOW ATTACHED  SALTY 6 SEE ATTACHED NEW LOCATION MAINTAIN PHYSICAL ACTIVITY

## 2023-12-13 LAB — BASIC METABOLIC PANEL
BUN/Creatinine Ratio: 33 — ABNORMAL HIGH (ref 10–24)
BUN: 25 mg/dL (ref 8–27)
CO2: 23 mmol/L (ref 20–29)
Calcium: 9 mg/dL (ref 8.6–10.2)
Chloride: 101 mmol/L (ref 96–106)
Creatinine, Ser: 0.76 mg/dL (ref 0.76–1.27)
Glucose: 94 mg/dL (ref 70–99)
Potassium: 4.8 mmol/L (ref 3.5–5.2)
Sodium: 137 mmol/L (ref 134–144)
eGFR: 87 mL/min/{1.73_m2} (ref >59)

## 2024-01-21 NOTE — Progress Notes (Signed)
 Cardiology Clinic Note   Patient Name: Ronald Kaiser Date of Encounter: 01/23/2024  Primary Care Provider:  Tura Gaines, MD Primary Cardiologist:  Alexandria Angel, MD  Patient Profile    Ronald Kaiser 89 year old male presents to the clinic today for follow-up evaluation of his coronary artery disease, hypertension, and CHF.  Past Medical History    Past Medical History:  Diagnosis Date   AKI (acute kidney injury) (HCC) 11/12/2018   CAD (coronary artery disease)    CHF (congestive heart failure) (HCC)    Dyspnea    Foley catheter in place    HTN (hypertension)    Hyperlipidemia    Myocardial infarction (HCC) 2000   REPORTS HE HAS HAD 5 HEART ATTACKS BUT SOME OF THE "WERE NOT SERIOUS"    Obesity    Prostate cancer [facility])    Past Surgical History:  Procedure Laterality Date   CARDIAC CATHETERIZATION N/A 03/09/2016   Procedure: Left Heart Cath and Coronary Angiography;  Surgeon: Arty Binning, MD;  Location: [facility] INVASIVE CV LAB;  Service: Cardiovascular;  Laterality: N/A;   CYSTOSCOPY W/ RETROGRADES Bilateral 08/19/2020   Procedure: BILATERAL RETROGRADE PYELOGRAM;  Surgeon: Andrez Banker, MD;  Location: [facility] ORS;  Service: Urology;  Laterality: Bilateral;   RADIATION TO PROSTATE     Stenting of LAD  2000   TONSILLECTOMY     TRANSURETHRAL RESECTION OF PROSTATE N/A 03/01/2019   Procedure: TRANSURETHRAL RESECTION OF THE PROSTATE (TURP);  Surgeon: Andrez Banker, MD;  Location: [facility];  Service: Urology;  Laterality: N/A;  75 MINS   TRANSURETHRAL RESECTION OF PROSTATE N/A 08/19/2020   Procedure: TRANSURETHRAL RESECTION OF THE PROSTATE (TURP);  Surgeon: Andrez Banker, MD;  Location: [facility];  Service: Urology;  Laterality: N/A;    Allergies  Allergies  Allergen Reactions   Bactrim [Sulfamethoxazole-Trimethoprim] Hives, Shortness Of Breath and Swelling   Amoxicillin-Pot Clavulanate Diarrhea   Crestor  [Rosuvastatin  Calcium ] Other (See Comments)    Back  pain,grogginess and dizziness   Zetia  [Ezetimibe ] Other (See Comments)    Back pain, grogginess and dizziness    History of Present Illness    Ronald Kaiser has a PMH of coronary artery disease with STEMI in 2000.  He received PTCA and stenting of his second diagonal.  He underwent repeat cardiac catheterization 6/17 which showed 50% proximal right coronary artery, 45% proximal-distal LAD and 50% circumflex.  His PMH also includes paroxysmal atrial fibrillation, HLD, HTN, and ischemic cardiomyopathy.  His echocardiogram 09/11/2023 showed an EF of 30-35%.  He was seen by Dr. Audery Blazing in follow-up and was placed on metoprolol  25 mg daily and spironolactone  12.5 mg daily.  He presented for follow-up 10/23/2023 with Friddie Jetty, DNP.  He reported that he was having trouble with his new medications.  He was feeling bad.  His wife reported that he was more grumpy.  He noted stiffness and left hip pain.  He complained of muscle pain on spironolactone .  He reported that he was unaware that his heart function was reduced.  He denied dizziness, near-syncope, shortness of breath on exertion and chest pain.  He denied palpitations.  His spironolactone  was discontinued.  His EF was reviewed.  He was started on Entresto  24-26 and his lisinopril  was stopped.  3-week follow-up was planned.  His metoprolol  succinate was transition to Toprol  all tartrate 25 mg twice daily.  He presented to the clinic 11/28/23 for follow-up evaluation and stated he did not tolerate Entresto  or  metoprolol  succinate.  He was frustrated with the medication regimen.  He felt he was on too much blood pressure medication.  He denied chest pain and shortness of breath.  He was euvolemic .  I  discontinued his metoprolol  succinate and Entresto .  I prescribed metoprolol  to tartrate 12.5 mg twice daily and lisinopril  2.5 mg daily.  I ordered a BMP in 1 week and planned follow-up in 1 to 2 months.  His lab work was reassuring.  He presents to  the clinic today for follow-up evaluation and states he is doing much better with lisinopril  and 12.5 mg of metoprolol  tartrate twice daily.  We reviewed GDMT.  I discussed pathophysiology of ischemic cardiomyopathy.  He expressed understanding.  He reports that he has been walking his dog greater than 1 mile a day.  He denies chest pain.  He does note some left hip pain.  I will repeat his echocardiogram, have him maintain his physical activity and plan follow-up after his echocardiogram.  Today he denies chest pain, shortness of breath, lower extremity edema, fatigue, palpitations, melena, hematuria, hemoptysis, diaphoresis, weakness, presyncope, syncope, orthopnea, and PND.   Home Medications    Prior to Admission medications   Medication Sig Start Date End Date Taking? Authorizing Provider  aspirin  EC 81 MG tablet Take 81 mg by mouth 2 (two) times daily after a meal.    [provider]  furosemide  (LASIX ) 20 MG tablet Take 20 mg by mouth as needed for fluid or edema.    [provider]  metoprolol  succinate (TOPROL  XL) 25 MG 24 hr tablet Take 1 tablet (25 mg total) by mouth daily. 08/01/23   Lenise Quince, MD  metoprolol  tartrate (LOPRESSOR ) 25 MG tablet Take 1 tablet (25 mg total) by mouth 2 (two) times daily. 10/23/23 01/21/24  Tania Familia, NP  Multiple Vitamins-Minerals (CENTRUM SILVER ULTRA MENS PO) Take 1 tablet by mouth daily.     [provider]  nitroGLYCERIN  (NITROSTAT ) 0.4 MG SL tablet Place 1 tablet (0.4 mg total) under the tongue every 5 (five) minutes as needed. 04/17/17   Lenise Quince, MD  pravastatin  (PRAVACHOL ) 20 MG tablet Take 1 tablet (20 mg total) by mouth every evening. 08/01/23   Lenise Quince, MD  sacubitril -valsartan  (ENTRESTO ) 24-26 MG Take 1 tablet by mouth 2 (two) times daily. 10/23/23   Tania Familia, NP  sacubitril -valsartan  (ENTRESTO ) 24-26 MG Take 1 tablet by mouth 2 (two) times daily. 10/23/23   Tania Familia,  NP  vitamin B-12 (CYANOCOBALAMIN) 1000 MCG tablet Take 1,000 mcg by mouth daily.    [provider]    Family History    Family History  Problem Relation Age of Onset   Hypertension Father    Breast cancer Neg Hx    Prostate cancer Neg Hx    Colon cancer Neg Hx    Pancreatic cancer Neg Hx    He indicated that his mother is deceased. He indicated that his father is deceased. He indicated that his maternal grandmother is deceased. He indicated that his maternal grandfather is deceased. He indicated that his paternal grandmother is deceased. He indicated that his paternal grandfather is deceased. He indicated that the status of his neg hx is unknown.  Social History    Social History   Socioeconomic History   Marital status: Married    Spouse name: Not on file   Number of children: Not on file   Years of education: Not on file  Highest education level: Not on file  Occupational History    Comment: retired  Tobacco Use   Smoking status: Former    Current packs/day: 0.00    Average packs/day: 0.5 packs/day for 7.0 years (3.5 ttl pk-yrs)    Types: Cigarettes    Start date: 10/04/1955    Quit date: 10/03/1962    Years since quitting: 61.3   Smokeless tobacco: Never  Vaping Use   Vaping status: Never Used  Substance and Sexual Activity   Alcohol use: Yes    Comment: [employer] [employer]    Drug use: Not Currently   Sexual activity: Not Currently  Other Topics Concern   Not on file  Social History Narrative   Moved from [facility] to [place], [place] following divorce to be closer to his brother. Patient has remarried.   Social Drivers of Corporate investment banker Strain: Not on file  Food Insecurity: Low Risk  (01/19/2024)   Received from [facility]   Hunger Vital Sign    Worried About Running Out of Food in the Last Year: Never true    Ran Out of Food in the Last Year: Never true  Transportation Needs: No Transportation Needs (01/19/2024)   Received from [employer]    In the past 12 months, has lack of reliable transportation kept you from medical appointments, meetings, work or from getting things needed for daily living? : No  Physical Activity: Not on file  Stress: Not on file  Social Connections: Not on file  Intimate Partner Violence: Not on file     Review of Systems    General:  No chills, fever, night sweats or weight changes.  Cardiovascular:  No chest pain, dyspnea on exertion, edema, orthopnea, palpitations, paroxysmal nocturnal dyspnea. Dermatological: No rash, lesions/masses Respiratory: No cough, dyspnea Urologic: No hematuria, dysuria Abdominal:   No nausea, vomiting, diarrhea, bright red blood per rectum, melena, or hematemesis Neurologic:  No visual changes, wkns, changes in mental status. All other systems reviewed and are otherwise negative except as noted above.  Physical Exam    VS:  BP (!) 94/50 (BP Location: Right Arm, Patient Position: Sitting, Cuff Size: Normal)   Pulse 76   Ht 6' (1.829 m)   Wt 152 lb 6.4 oz (69.1 kg)   SpO2 94%   BMI 20.67 kg/m  , BMI Body mass index is 20.67 kg/m. GEN: Well nourished, well developed, in no acute distress. HEENT: normal. Neck: Supple, no JVD, carotid bruits, or masses. Cardiac: RRR, no murmurs, rubs, or gallops. No clubbing, cyanosis, edema.  Radials/DP/PT 2+ and equal bilaterally.  Respiratory:  Respirations regular and unlabored, clear to auscultation bilaterally. GI: Soft, nontender, nondistended, BS + x 4. MS: no deformity or atrophy. Skin: warm and dry, no rash. Neuro:  Strength and sensation are intact. Psych: Normal affect.  Accessory Clinical Findings    Recent Labs: 09/11/2023: ALT 13 12/13/2023: BUN 25; Creatinine, Ser 0.76; Potassium 4.8; Sodium 137   Recent Lipid Panel    Component Value Date/Time   CHOL 163 02/28/2023 0838   TRIG 97 02/28/2023 0838   HDL 56 02/28/2023 0838   CHOLHDL 2.9 02/28/2023 0838   CHOLHDL 3.9 01/21/2016 1027    VLDL 31 (H) 01/21/2016 1027   LDLCALC 89 02/28/2023 0838         ECG personally reviewed by me today-none today.    Echocardiogram 09/11/2023  1. Left ventricular ejection fraction, by estimation, is 30 to 35%.  The  left ventricle has moderately decreased function. The left ventricle  demonstrates regional wall motion abnormalities with mid to apical septal  and anterior akinesis, akinesis of the   true apex. No LV thrombus noted (prominent apical trabeculations). Left  ventricular diastolic parameters are consistent with Grade I diastolic  dysfunction (impaired relaxation).   2. Right ventricular systolic function is normal. The right ventricular  size is normal. There is normal pulmonary artery systolic pressure. The  estimated right ventricular systolic pressure is 29.0 mmHg.   3. Left atrial size was mildly dilated.   4. The mitral valve is normal in structure. Mild mitral valve  regurgitation. No evidence of mitral stenosis.   5. The aortic valve is tricuspid. There is mild calcification of the  aortic valve. Aortic valve regurgitation is trivial. No aortic stenosis is  present.   6. The inferior vena cava is normal in size with greater than 50%  respiratory variability, suggesting right atrial pressure of 3 mmHg.       Assessment & Plan   1.  Ischemic cardiomyopathy-weight today 152.4  No increased DOE or activity intolerance.   EF noted to be 30-35% on echocardiogram 09/11/2023.  Tolerating lisinopril  and metoprolol  much better than metoprolol  succinate and Entresto .  Unable to uptitrate GDMT due to blood pressure Continue lisinopril  2.5 mg, metoprolol  tartrate 12.5 mg twice daily Repeat echocardiogram  Essential hypertension-BP today 94/50 Continue metoprolol , lisinopril  Heart healthy low-sodium diet  Coronary artery disease-denies chest pain.  Has had multiple cardiac catheterizations with most recent 6/17 showing 50% proximal RCA, 45% proximal-distal LAD and 50%  circumflex Continue aspirin , metoprolol , pravastatin    Disposition: Follow-up with Dr. Audery Blazing or me after echocardiogram.  Ronald Cota. Mende Biswell NP-C     01/23/2024, 2:43 PM [facility] [address] Office [phone] Fax [phone]    I spent 13 minutes examining this patient, reviewing medications, and using patient centered shared decision making involving their cardiac care.   I spent  20 minutes reviewing past medical history,  medications, and prior cardiac tests.

## 2024-01-23 ENCOUNTER — Ambulatory Visit: Payer: Medicare Other | Attending: General Practice | Admitting: General Practice

## 2024-01-23 ENCOUNTER — Encounter: Payer: Self-pay | Admitting: General Practice

## 2024-01-23 VITALS — BP 94/50 | HR 76 | Ht 72.0 in | Wt 152.4 lb

## 2024-01-23 DIAGNOSIS — I2510 Atherosclerotic heart disease of native coronary artery without angina pectoris: Secondary | ICD-10-CM | POA: Diagnosis present

## 2024-01-23 DIAGNOSIS — I255 Ischemic cardiomyopathy: Secondary | ICD-10-CM | POA: Diagnosis present

## 2024-01-23 DIAGNOSIS — I10 Essential (primary) hypertension: Secondary | ICD-10-CM | POA: Diagnosis present

## 2024-01-23 NOTE — Patient Instructions (Signed)
 Medication Instructions:  The current medical regimen is effective;  continue present plan and medications as directed. Please refer to the Current Medication list given to you today.  *If you need a refill on your cardiac medications before your next appointment, please call your pharmacy*  Lab Work: NONE  Testing/Procedures: Your physician has requested that you have an echocardiogram. Echocardiography is a painless test that uses sound waves to create images of your heart. It provides your doctor with information about the size and shape of your heart and how well your heart's chambers and valves are working. This procedure takes approximately one hour. There are no restrictions for this procedure. Please do NOT wear cologne, perfume, aftershave, or lotions (deodorant is allowed). Please arrive 15 minutes prior to your appointment time.  Please note: We ask at that you not bring children with you during ultrasound (echo/ vascular) testing. Due to room size and safety concerns, children are not allowed in the ultrasound rooms during exams. Our front office staff cannot provide observation of children in our lobby area while testing is being conducted. An adult accompanying a patient to their appointment will only be allowed in the ultrasound room at the discretion of the ultrasound technician under special circumstances. We apologize for any inconvenience.   Follow-Up: At [facility], you and your health needs are our priority.  As part of our continuing mission to provide you with exceptional heart care, our providers are all part of one team.  This team includes your primary Cardiologist (physician) and Advanced Practice Providers or APPs (Physician Assistants and Nurse Practitioners) who all work together to provide you with the care you need, when you need it.  Your next appointment:   AFTER TESTING   Provider:   Alexandria Angel, MD -OR- Lawana Pray, FNP-C    Other  Instructions CONTINUE CURRENT PHYSICAL ACTIVITY      1st Floor: - Lobby - Registration  - Pharmacy  - Lab - Cafe  2nd Floor: - PV Lab - Diagnostic Testing (echo, CT, nuclear med)  3rd Floor: - Vacant  4th Floor: - TCTS (cardiothoracic surgery) - AFib Clinic - Structural Heart Clinic - Vascular Surgery  - Vascular Ultrasound  5th Floor: - HeartCare Cardiology (general and EP) - Clinical Pharmacy for coumadin, hypertension, lipid, weight-loss medications, and med management appointments    Valet parking services will be available as well.

## 2024-02-22 ENCOUNTER — Ambulatory Visit (HOSPITAL_COMMUNITY)
Admission: RE | Admit: 2024-02-22 | Discharge: 2024-02-22 | Disposition: A | Discharge status: Home / Self Care | Source: Ambulatory Visit | Attending: Cardiovascular Disease | Admitting: Cardiovascular Disease

## 2024-02-22 DIAGNOSIS — I255 Ischemic cardiomyopathy: Secondary | ICD-10-CM | POA: Diagnosis present

## 2024-02-22 MED ORDER — PERFLUTREN LIPID MICROSPHERE
3.0000 mL | INTRAVENOUS | Status: AC | PRN
  Administered 2024-02-22: 3 mL via INTRAVENOUS

## 2024-02-23 ENCOUNTER — Ambulatory Visit: Payer: Self-pay | Admitting: General Practice

## 2024-02-23 LAB — ECHOCARDIOGRAM COMPLETE
AR max vel: 2.88 cm2
AV Area VTI: 2.96 cm2
AV Area mean vel: 2.84 cm2
AV Mean grad: 4 mmHg
AV Peak grad: 7.1 mmHg
Ao pk vel: 1.33 m/s
Area-P 1/2: 1.71 cm2
P 1/2 time: 641 ms
S' Lateral: 4.3 cm

## 2024-03-10 NOTE — Progress Notes (Deleted)
 Cardiology Clinic Note   Patient Name: Ronald Kaiser Date of Encounter: 03/10/2024  Primary Care Provider:  Tura Gaines, MD Primary Cardiologist:  Alexandria Angel, MD  Patient Profile    Ronald Kaiser 89 year old male presents to the clinic today for follow-up evaluation of his coronary artery disease, hypertension, and CHF.  Past Medical History    Past Medical History:  Diagnosis Date   AKI (acute kidney injury) (HCC) 11/12/2018   CAD (coronary artery disease)    CHF (congestive heart failure) (HCC)    Dyspnea    Foley catheter in place    HTN (hypertension)    Hyperlipidemia    Myocardial infarction (HCC) 2000   REPORTS HE HAS HAD 5 HEART ATTACKS BUT SOME OF THE "WERE NOT SERIOUS"    Obesity    Prostate cancer [facility]    Past Surgical History:  Procedure Laterality Date   CARDIAC CATHETERIZATION N/A 03/09/2016   Procedure: Left Heart Cath and Coronary Angiography;  Surgeon: Arty Binning, MD;  Location: [facility] INVASIVE CV LAB;  Service: Cardiovascular;  Laterality: N/A;   CYSTOSCOPY W/ RETROGRADES Bilateral 08/19/2020   Procedure: BILATERAL RETROGRADE PYELOGRAM;  Surgeon: Andrez Banker, MD;  Location: [facility] ORS;  Service: Urology;  Laterality: Bilateral;   RADIATION TO PROSTATE     Stenting of LAD  2000   TONSILLECTOMY     TRANSURETHRAL RESECTION OF PROSTATE N/A 03/01/2019   Procedure: TRANSURETHRAL RESECTION OF THE PROSTATE (TURP);  Surgeon: Andrez Banker, MD;  Location: [facility];  Service: Urology;  Laterality: N/A;  75 MINS   TRANSURETHRAL RESECTION OF PROSTATE N/A 08/19/2020   Procedure: TRANSURETHRAL RESECTION OF THE PROSTATE (TURP);  Surgeon: Andrez Banker, MD;  Location: [facility];  Service: Urology;  Laterality: N/A;    Allergies  Allergies  Allergen Reactions   Bactrim [Sulfamethoxazole-Trimethoprim] Hives, Shortness Of Breath and Swelling   Amoxicillin-Pot Clavulanate Diarrhea   Crestor  [Rosuvastatin  Calcium ] Other (See Comments)    Back  pain,grogginess and dizziness   Zetia  [Ezetimibe ] Other (See Comments)    Back pain, grogginess and dizziness    History of Present Illness    Ronald Kaiser has a PMH of coronary artery disease with STEMI in 2000.  He received PTCA and stenting of his second diagonal.  He underwent repeat cardiac catheterization 6/17 which showed 50% proximal right coronary artery, 45% proximal-distal LAD and 50% circumflex.  His PMH also includes paroxysmal atrial fibrillation, HLD, HTN, and ischemic cardiomyopathy.  His echocardiogram 09/11/2023 showed an EF of 30-35%.  He was seen by Dr. Audery Blazing in follow-up and was placed on metoprolol  25 mg daily and spironolactone  12.5 mg daily.  He presented for follow-up 10/23/2023 with Friddie Jetty, DNP.  He reported that he was having trouble with his new medications.  He was feeling bad.  His wife reported that he was more grumpy.  He noted stiffness and left hip pain.  He complained of muscle pain on spironolactone .  He reported that he was unaware that his heart function was reduced.  He denied dizziness, near-syncope, shortness of breath on exertion and chest pain.  He denied palpitations.  His spironolactone  was discontinued.  His EF was reviewed.  He was started on Entresto  24-26 and his lisinopril  was stopped.  3-week follow-up was planned.  His metoprolol  succinate was transition to Toprol  all tartrate 25 mg twice daily.  He presented to the clinic 11/28/23 for follow-up evaluation and stated he did not tolerate Entresto  or  metoprolol  succinate.  He was frustrated with the medication regimen.  He felt he was on too much blood pressure medication.  He denied chest pain and shortness of breath.  He was euvolemic .  I  discontinued his metoprolol  succinate and Entresto .  I prescribed metoprolol  to tartrate 12.5 mg twice daily and lisinopril  2.5 mg daily.  I ordered a BMP in 1 week and planned follow-up in 1 to 2 months.  His lab work was reassuring.  He presents to  the clinic today for follow-up evaluation and states he is doing much better with lisinopril  and 12.5 mg of metoprolol  tartrate twice daily.  We reviewed GDMT.  I discussed pathophysiology of ischemic cardiomyopathy.  He expressed understanding.  He reports that he has been walking his dog greater than 1 mile a day.  He denies chest pain.  He does note some left hip pain.  I will repeat his echocardiogram, have him maintain his physical activity and plan follow-up after his echocardiogram.  Today he denies chest pain, shortness of breath, lower extremity edema, fatigue, palpitations, melena, hematuria, hemoptysis, diaphoresis, weakness, presyncope, syncope, orthopnea, and PND.   Home Medications    Prior to Admission medications   Medication Sig Start Date End Date Taking? Authorizing Provider  aspirin  EC 81 MG tablet Take 81 mg by mouth 2 (two) times daily after a meal.    [provider]  furosemide  (LASIX ) 20 MG tablet Take 20 mg by mouth as needed for fluid or edema.    [provider]  metoprolol  succinate (TOPROL  XL) 25 MG 24 hr tablet Take 1 tablet (25 mg total) by mouth daily. 08/01/23   Lenise Quince, MD  metoprolol  tartrate (LOPRESSOR ) 25 MG tablet Take 1 tablet (25 mg total) by mouth 2 (two) times daily. 10/23/23 01/21/24  Tania Familia, NP  Multiple Vitamins-Minerals (CENTRUM SILVER ULTRA MENS PO) Take 1 tablet by mouth daily.     [provider]  nitroGLYCERIN  (NITROSTAT ) 0.4 MG SL tablet Place 1 tablet (0.4 mg total) under the tongue every 5 (five) minutes as needed. 04/17/17   Lenise Quince, MD  pravastatin  (PRAVACHOL ) 20 MG tablet Take 1 tablet (20 mg total) by mouth every evening. 08/01/23   Lenise Quince, MD  sacubitril -valsartan  (ENTRESTO ) 24-26 MG Take 1 tablet by mouth 2 (two) times daily. 10/23/23   Tania Familia, NP  sacubitril -valsartan  (ENTRESTO ) 24-26 MG Take 1 tablet by mouth 2 (two) times daily. 10/23/23   Tania Familia,  NP  vitamin B-12 (CYANOCOBALAMIN) 1000 MCG tablet Take 1,000 mcg by mouth daily.    [provider]    Family History    Family History  Problem Relation Age of Onset   Hypertension Father    Breast cancer Neg Hx    Prostate cancer Neg Hx    Colon cancer Neg Hx    Pancreatic cancer Neg Hx    He indicated that his mother is deceased. He indicated that his father is deceased. He indicated that his maternal grandmother is deceased. He indicated that his maternal grandfather is deceased. He indicated that his paternal grandmother is deceased. He indicated that his paternal grandfather is deceased. He indicated that the status of his neg hx is unknown.  Social History    Social History   Socioeconomic History   Marital status: Married    Spouse name: Not on file   Number of children: Not on file   Years of education: Not on file  Highest education level: Not on file  Occupational History    Comment: retired  Tobacco Use   Smoking status: Former    Current packs/day: 0.00    Average packs/day: 0.5 packs/day for 7.0 years (3.5 ttl pk-yrs)    Types: Cigarettes    Start date: 10/04/1955    Quit date: 10/03/1962    Years since quitting: 61.4   Smokeless tobacco: Never  Vaping Use   Vaping status: Never Used  Substance and Sexual Activity   Alcohol use: Yes    Comment: [facility] [employer]    Drug use: Not Currently   Sexual activity: Not Currently  Other Topics Concern   Not on file  Social History Narrative   Moved from [facility] to [place], [place] following divorce to be closer to his brother. Patient has remarried.   Social Drivers of Corporate investment banker Strain: Not on file  Food Insecurity: Low Risk  (01/25/2024)   Received from [facility]   Hunger Vital Sign    Worried About Running Out of Food in the Last Year: Never true    Ran Out of Food in the Last Year: Never true  Transportation Needs: No Transportation Needs (01/25/2024)   Received from [employer] [facility]    In the past 12 months, has lack of reliable transportation kept you from medical appointments, meetings, work or from getting things needed for daily living? : No  Physical Activity: Not on file  Stress: Not on file  Social Connections: Not on file  Intimate Partner Violence: Not on file     Review of Systems    General:  No chills, fever, night sweats or weight changes.  Cardiovascular:  No chest pain, dyspnea on exertion, edema, orthopnea, palpitations, paroxysmal nocturnal dyspnea. Dermatological: No rash, lesions/masses Respiratory: No cough, dyspnea Urologic: No hematuria, dysuria Abdominal:   No nausea, vomiting, diarrhea, bright red blood per rectum, melena, or hematemesis Neurologic:  No visual changes, wkns, changes in mental status. All other systems reviewed and are otherwise negative except as noted above.  Physical Exam    VS:  There were no vitals taken for this visit. , BMI There is no height or weight on file to calculate BMI. GEN: Well nourished, well developed, in no acute distress. HEENT: normal. Neck: Supple, no JVD, carotid bruits, or masses. Cardiac: RRR, no murmurs, rubs, or gallops. No clubbing, cyanosis, edema.  Radials/DP/PT 2+ and equal bilaterally.  Respiratory:  Respirations regular and unlabored, clear to auscultation bilaterally. GI: Soft, nontender, nondistended, BS + x 4. MS: no deformity or atrophy. Skin: warm and dry, no rash. Neuro:  Strength and sensation are intact. Psych: Normal affect.  Accessory Clinical Findings    Recent Labs: 09/11/2023: ALT 13 12/13/2023: BUN 25; Creatinine, Ser 0.76; Potassium 4.8; Sodium 137   Recent Lipid Panel    Component Value Date/Time   CHOL 163 02/28/2023 0838   TRIG 97 02/28/2023 0838   HDL 56 02/28/2023 0838   CHOLHDL 2.9 02/28/2023 0838   CHOLHDL 3.9 01/21/2016 1027   VLDL 31 (H) 01/21/2016 1027   LDLCALC 89 02/28/2023 0838    No BP recorded.  {Refresh Note OR Click here to  enter BP  :1}***    ECG personally reviewed by me today-none today.    Echocardiogram 09/11/2023  1. Left ventricular ejection fraction, by estimation, is 30 to 35%. The  left ventricle has moderately decreased function. The left ventricle  demonstrates regional wall motion abnormalities  with mid to apical septal  and anterior akinesis, akinesis of the   true apex. No LV thrombus noted (prominent apical trabeculations). Left  ventricular diastolic parameters are consistent with Grade I diastolic  dysfunction (impaired relaxation).   2. Right ventricular systolic function is normal. The right ventricular  size is normal. There is normal pulmonary artery systolic pressure. The  estimated right ventricular systolic pressure is 29.0 mmHg.   3. Left atrial size was mildly dilated.   4. The mitral valve is normal in structure. Mild mitral valve  regurgitation. No evidence of mitral stenosis.   5. The aortic valve is tricuspid. There is mild calcification of the  aortic valve. Aortic valve regurgitation is trivial. No aortic stenosis is  present.   6. The inferior vena cava is normal in size with greater than 50%  respiratory variability, suggesting right atrial pressure of 3 mmHg.       Assessment & Plan   1.  Ischemic cardiomyopathy-weight today 152.4  No increased DOE or activity intolerance.   EF noted to be 30-35% on echocardiogram 09/11/2023.  Tolerating lisinopril  and metoprolol  much better than metoprolol  succinate and Entresto .  Unable to uptitrate GDMT due to blood pressure Continue lisinopril  2.5 mg, metoprolol  tartrate 12.5 mg twice daily Repeat echocardiogram  Essential hypertension-BP today 94/50 Continue metoprolol , lisinopril  Heart healthy low-sodium diet  Coronary artery disease-denies chest pain.  Has had multiple cardiac catheterizations with most recent 6/17 showing 50% proximal RCA, 45% proximal-distal LAD and 50% circumflex Continue aspirin , metoprolol ,  pravastatin    Disposition: Follow-up with Dr. Audery Blazing or me after echocardiogram.  Chet Cota. Toini Failla NP-C     03/10/2024, 7:13 PM [facility] [address] Office [phone] Fax [phone]    I spent 13*** minutes examining this patient, reviewing medications, and using patient centered shared decision making involving their cardiac care.   I spent  20 minutes reviewing past medical history,  medications, and prior cardiac tests.

## 2024-03-12 ENCOUNTER — Emergency Department (HOSPITAL_COMMUNITY)
Admission: EM | Admit: 2024-03-12 | Discharge: 2024-03-12 | Disposition: A | Discharge status: Home / Self Care | Attending: Emergency Medicine | Admitting: Emergency Medicine

## 2024-03-12 ENCOUNTER — Other Ambulatory Visit: Payer: Self-pay

## 2024-03-12 ENCOUNTER — Encounter (HOSPITAL_COMMUNITY): Payer: Self-pay

## 2024-03-12 DIAGNOSIS — Z7982 Long term (current) use of aspirin: Secondary | ICD-10-CM | POA: Insufficient documentation

## 2024-03-12 DIAGNOSIS — R339 Retention of urine, unspecified: Secondary | ICD-10-CM | POA: Insufficient documentation

## 2024-03-12 DIAGNOSIS — N390 Urinary tract infection, site not specified: Secondary | ICD-10-CM | POA: Insufficient documentation

## 2024-03-12 DIAGNOSIS — R338 Other retention of urine: Secondary | ICD-10-CM

## 2024-03-12 LAB — URINALYSIS, W/ REFLEX TO CULTURE (INFECTION SUSPECTED)
Bacteria, UA: NONE SEEN
Bilirubin Urine: NEGATIVE
Glucose, UA: NEGATIVE mg/dL
Ketones, ur: 5 mg/dL — AB
Nitrite: NEGATIVE
Protein, ur: 100 mg/dL — AB
Specific Gravity, Urine: 1.016 (ref 1.005–1.030)
WBC, UA: >50 WBC/hpf (ref 0–5)
pH: 5 (ref 5.0–8.0)

## 2024-03-12 LAB — CBC WITH DIFFERENTIAL/PLATELET
Abs Immature Granulocytes: 0.02 10*3/uL (ref 0.00–0.07)
Basophils Absolute: 0 10*3/uL (ref 0.0–0.1)
Basophils Relative: 0 %
Eosinophils Absolute: 0 10*3/uL (ref 0.0–0.5)
Eosinophils Relative: 0 %
HCT: 32.3 % — ABNORMAL LOW (ref 39.0–52.0)
Hemoglobin: 10.4 g/dL — ABNORMAL LOW (ref 13.0–17.0)
Immature Granulocytes: 0 %
Lymphocytes Relative: 6 %
Lymphs Abs: 0.6 10*3/uL — ABNORMAL LOW (ref 0.7–4.0)
MCH: 30.5 pg (ref 26.0–34.0)
MCHC: 32.2 g/dL (ref 30.0–36.0)
MCV: 94.7 fL (ref 80.0–100.0)
Monocytes Absolute: 0.7 10*3/uL (ref 0.1–1.0)
Monocytes Relative: 7 %
Neutro Abs: 8.8 10*3/uL — ABNORMAL HIGH (ref 1.7–7.7)
Neutrophils Relative %: 87 %
Platelets: 286 10*3/uL (ref 150–400)
RBC: 3.41 MIL/uL — ABNORMAL LOW (ref 4.22–5.81)
RDW: 16.1 % — ABNORMAL HIGH (ref 11.5–15.5)
WBC: 10 10*3/uL (ref 4.0–10.5)
nRBC: 0 % (ref 0.0–0.2)

## 2024-03-12 LAB — BASIC METABOLIC PANEL WITH GFR
Anion gap: 11 (ref 5–15)
BUN: 36 mg/dL — ABNORMAL HIGH (ref 8–23)
CO2: 21 mmol/L — ABNORMAL LOW (ref 22–32)
Calcium: 9.3 mg/dL (ref 8.9–10.3)
Chloride: 100 mmol/L (ref 98–111)
Creatinine, Ser: 1.23 mg/dL (ref 0.61–1.24)
GFR, Estimated: 57 mL/min — ABNORMAL LOW (ref >60)
Glucose, Bld: 156 mg/dL — ABNORMAL HIGH (ref 70–99)
Potassium: 4.3 mmol/L (ref 3.5–5.1)
Sodium: 132 mmol/L — ABNORMAL LOW (ref 135–145)

## 2024-03-12 MED ORDER — CEPHALEXIN 500 MG PO CAPS: 500.0000 mg | ORAL_CAPSULE | Freq: Two times a day (BID) | ORAL | 0 refills | Status: AC

## 2024-03-12 MED ORDER — CEPHALEXIN 500 MG PO CAPS
500.0000 mg | ORAL_CAPSULE | Freq: Once | ORAL | Status: AC
  Administered 2024-03-12: 500 mg via ORAL
  Filled 2024-03-12: qty 1

## 2024-03-12 NOTE — ED Notes (Signed)
 Patient O2 dropped to 77% RA RN readjusted oxygen probed on finger O2 increased to 80% RN placed patient on 2L O2 O2 now 97%

## 2024-03-12 NOTE — Discharge Instructions (Signed)
 Follow-up with your primary care physician and/or urology for your urinary retention and bladder infection.  We are putting you on 5 days of antibiotics.  If you develop recurrent trouble urinating, abdominal pain, fever, vomiting, or any other new/concerning symptoms then return to the ER.

## 2024-03-12 NOTE — ED Provider Notes (Signed)
 [place] EMERGENCY DEPARTMENT AT [facility] Provider Note   CSN: [id number] Arrival date & time: 03/12/24  4098     History  Chief Complaint  Patient presents with   Urinary Retention    Ronald Kaiser is a 89 y.o. male.  HPI 89 year old male with a history of chronic urinary incontinence and occasionally needing straight caths presents with urinary retention.  He has not had any significant urine output since 5 PM last night.  He states sometimes this happens and he will be able to make it to the urology clinic but due to the degree of discomfort in his abdomen he called 911.  He has otherwise been well, no recent fevers, vomiting, or new back pain.  He has not had any recent dysuria or hematuria.  Home Medications Prior to Admission medications   Medication Sig Start Date End Date Taking? Authorizing Provider  cephALEXin  (KEFLEX ) 500 MG capsule Take 1 capsule (500 mg total) by mouth 2 (two) times daily for 5 days. 03/12/24 03/17/24 Yes Jerilynn Montenegro, MD  aspirin  EC 81 MG tablet Take 81 mg by mouth 2 (two) times daily after a meal.    [provider]  CALCIUM  PO Take by mouth daily.    [provider]  furosemide  (LASIX ) 20 MG tablet Take 20 mg by mouth as needed for fluid or edema.    [provider]  lisinopril  (ZESTRIL ) 5 MG tablet Take 0.5 tablets (2.5 mg total) by mouth daily. 11/28/23 02/26/24  Carie Charity, NP  metoprolol  tartrate (LOPRESSOR ) 25 MG tablet Take 0.5 tablets (12.5 mg total) by mouth 2 (two) times daily. 11/28/23 02/26/24  Carie Charity, NP  Multiple Vitamins-Minerals (CENTRUM SILVER ULTRA MENS PO) Take 1 tablet by mouth daily.     [provider]  nitrofurantoin, macrocrystal-monohydrate, (MACROBID) 100 MG capsule Take 100 mg by mouth daily. 10/23/23   [provider]  nitroGLYCERIN  (NITROSTAT ) 0.4 MG SL tablet Place 1 tablet (0.4 mg total) under the tongue every 5 (five) minutes as needed. 04/17/17    Lenise Quince, MD  pravastatin  (PRAVACHOL ) 20 MG tablet Take 1 tablet (20 mg total) by mouth every evening. 08/01/23   Lenise Quince, MD  vitamin B-12 (CYANOCOBALAMIN) 1000 MCG tablet Take 1,000 mcg by mouth daily.    [provider]      Allergies    Bactrim [sulfamethoxazole-trimethoprim], Amoxicillin-pot clavulanate, Crestor  [rosuvastatin  calcium ], and Zetia  [ezetimibe ]    Review of Systems   Review of Systems  Constitutional:  Negative for fever.  Gastrointestinal:  Positive for abdominal pain. Negative for vomiting.  Genitourinary:  Positive for difficulty urinating. Negative for hematuria.    Physical Exam Updated Vital Signs BP 106/63   Pulse 85   Temp (!) 97.5 F (36.4 C) (Oral)   Resp 18   Ht 6' (1.829 m)   Wt 69 kg   SpO2 98%   BMI 20.63 kg/m  Physical Exam Vitals and nursing note reviewed.  Constitutional:      Appearance: He is well-developed. He is not diaphoretic.  HENT:     Head: Normocephalic and atraumatic.  Pulmonary:     Effort: Pulmonary effort is normal.  Abdominal:     Palpations: Abdomen is soft.     Tenderness: There is abdominal tenderness.  Skin:    General: Skin is warm and dry.  Neurological:     Mental Status: He is alert.     ED Results / Procedures / Treatments  Labs (all labs ordered are listed, but only abnormal results are displayed) Labs Reviewed  URINALYSIS, W/ REFLEX TO CULTURE (INFECTION SUSPECTED) - Abnormal; Notable for the following components:      Result Value   APPearance CLOUDY (*)    Hgb urine dipstick MODERATE (*)    Ketones, ur 5 (*)    Protein, ur 100 (*)    Leukocytes,Ua SMALL (*)    All other components within normal limits  CBC WITH DIFFERENTIAL/PLATELET - Abnormal; Notable for the following components:   RBC 3.41 (*)    Hemoglobin 10.4 (*)    HCT 32.3 (*)    RDW 16.1 (*)    Neutro Abs 8.8 (*)    Lymphs Abs 0.6 (*)    All other components within normal limits  BASIC METABOLIC PANEL  WITH GFR - Abnormal; Notable for the following components:   Sodium 132 (*)    CO2 21 (*)    Glucose, Bld 156 (*)    BUN 36 (*)    GFR, Estimated 57 (*)    All other components within normal limits  URINE CULTURE    EKG None  Radiology No results found.  Procedures Procedures    Medications Ordered in ED Medications  cephALEXin  (KEFLEX ) capsule 500 mg (500 mg Oral Given 03/12/24 0900)    ED Course/ Medical Decision Making/ A&P                                 Medical Decision Making Amount and/or Complexity of Data Reviewed External Data Reviewed: notes. Labs: ordered.    Details: Normal WBC UA consistent with UTI.  Risk Prescription drug management.   Patient presents with acute urinary retention.  Foley catheter placed with relief of symptoms.  He states this happens to him from time to time and he usually just needs a straight cath.  Thus, after workup he is requesting discontinuation of the Foley which was performed.  Will put on antibiotics given the leukocytes seen in the urine and the urine was sent for culture.  Creatinine is slightly worse than baseline but now that he has voided I suspect this will go back down to baseline.  No signs of sepsis and has a normal WBC.  Will put on Keflex  based on prior culture results and have him follow-up with urology.        Final Clinical Impression(s) / ED Diagnoses Final diagnoses:  Acute urinary retention  Acute urinary tract infection    Rx / DC Orders ED Discharge Orders          Ordered    cephALEXin  (KEFLEX ) 500 MG capsule  2 times daily        03/12/24 1610              Jerilynn Montenegro, MD 03/12/24 [id number]

## 2024-03-12 NOTE — ED Triage Notes (Signed)
 Pt BIB [facility] from home with cc of inability to void. Per medics Pt states that he last voided on 6/9 @1700 . Incontinent @baseline . Endorses bil-lower abdominal pain and occasionally having to be straight cath @ his urologist office. Unknown date as to when he last had to be cath.

## 2024-03-13 ENCOUNTER — Ambulatory Visit: Admitting: General Practice

## 2024-03-13 LAB — URINE CULTURE: Culture: 60000 — AB

## 2024-03-14 ENCOUNTER — Telehealth (HOSPITAL_BASED_OUTPATIENT_CLINIC_OR_DEPARTMENT_OTHER): Payer: Self-pay | Admitting: *Deleted

## 2024-03-14 NOTE — Telephone Encounter (Signed)
 Post ED Visit - Positive Culture Follow-up  Culture report reviewed by antimicrobial stewardship pharmacist: [facility] []  Court Distance, Pharm.D. []  Skeet Duke, [address].D., BCPS AQ-ID []  Leslee Rase, Pharm.D., BCPS []  Garland Junk, Pharm.D., BCPS []  [place], [address].D., BCPS, AAHIVP []  Alcide Aly, Pharm.D., BCPS, AAHIVP []  Jerri Morale, PharmD, BCPS []  Graham Laws, PharmD, BCPS []  Cleda Curly, PharmD, BCPS []  Tamar Fairly, PharmD []  Ballard Levels, PharmD, BCPS []  Ollen Beverage, PharmD  [facility] [x]  Denson Flake, PharmD []  Sherryle Don, PharmD []  Van Gelinas, PharmD []  Delila Felty, Rph []  [place]una [place]) Cleora Daft, PharmD []  Augustina Block, PharmD []  Arie Kurtz, PharmD []  Sharlyn Deaner, PharmD []  Agnes Hose, PharmD []  Kendall Pauls, PharmD []  Gladstone Lamer, PharmD []  Armanda Bern, PharmD []  Tera Fellows, PharmD   Positive urine culture 03/12/24 Treated with cephalexin , organism sensitive to the same and no further patient follow-up is required at this time.  [name] 03/14/2024, 8:40 AM

## 2024-03-31 NOTE — Progress Notes (Unsigned)
 Cardiology Clinic Note   Patient Name: Ronald Kaiser Date of Encounter: 04/02/2024  Primary Care Provider:  Tanda Prentice DEL, MD Primary Cardiologist:  Redell Shallow, MD  Patient Profile    Ronald Kaiser 89 year old male presents to the clinic today for follow-up evaluation of his coronary artery disease, hypertension, and CHF.  Past Medical History    Past Medical History:  Diagnosis Date   AKI (acute kidney injury) (HCC) 11/12/2018   CAD (coronary artery disease)    CHF (congestive heart failure) (HCC)    Dyspnea    Foley catheter in place    HTN (hypertension)    Hyperlipidemia    Myocardial infarction (HCC) 2000   REPORTS HE HAS HAD 5 HEART ATTACKS BUT SOME OF THE WERE NOT SERIOUS    Obesity    Prostate cancer [facility])    Past Surgical History:  Procedure Laterality Date   CARDIAC CATHETERIZATION N/A 03/09/2016   Procedure: Left Heart Cath and Coronary Angiography;  Surgeon: Victory LELON Sharps, MD;  Location: [facility] INVASIVE CV LAB;  Service: Cardiovascular;  Laterality: N/A;   CYSTOSCOPY W/ RETROGRADES Bilateral 08/19/2020   Procedure: BILATERAL RETROGRADE PYELOGRAM;  Surgeon: Cam Morene LELON, MD;  Location: [facility] ORS;  Service: Urology;  Laterality: Bilateral;   RADIATION TO PROSTATE     Stenting of LAD  2000   TONSILLECTOMY     TRANSURETHRAL RESECTION OF PROSTATE N/A 03/01/2019   Procedure: TRANSURETHRAL RESECTION OF THE PROSTATE (TURP);  Surgeon: Cam Morene LELON, MD;  Location: [facility];  Service: Urology;  Laterality: N/A;  75 MINS   TRANSURETHRAL RESECTION OF PROSTATE N/A 08/19/2020   Procedure: TRANSURETHRAL RESECTION OF THE PROSTATE (TURP);  Surgeon: Cam Morene LELON, MD;  Location: [facility];  Service: Urology;  Laterality: N/A;    Allergies  Allergies  Allergen Reactions   Bactrim [Sulfamethoxazole-Trimethoprim] Hives, Shortness Of Breath and Swelling   Amoxicillin-Pot Clavulanate Diarrhea   Crestor  [Rosuvastatin  Calcium ] Other (See Comments)    Back  pain,grogginess and dizziness   Zetia  [Ezetimibe ] Other (See Comments)    Back pain, grogginess and dizziness    History of Present Illness    Ronald Kaiser has a PMH of coronary artery disease with STEMI in 2000.  He received PTCA and stenting of his second diagonal.  He underwent repeat cardiac catheterization 6/17 which showed 50% proximal right coronary artery, 45% proximal-distal LAD and 50% circumflex.  His PMH also includes paroxysmal atrial fibrillation, HLD, HTN, and ischemic cardiomyopathy.  His echocardiogram 09/11/2023 showed an EF of 30-35%.  He was seen by Dr. Shallow in follow-up and was placed on metoprolol  25 mg daily and spironolactone  12.5 mg daily.  He presented for follow-up 10/23/2023 with [name], DNP.  He reported that he was having trouble with his new medications.  He was feeling bad.  His wife reported that he was more grumpy.  He noted stiffness and left hip pain.  He complained of muscle pain on spironolactone .  He reported that he was unaware that his heart function was reduced.  He denied dizziness, near-syncope, shortness of breath on exertion and chest pain.  He denied palpitations.  His spironolactone  was discontinued.  His EF was reviewed.  He was started on Entresto  24-26 and his lisinopril  was stopped.  3-week follow-up was planned.  His metoprolol  succinate was transition to Toprol  all tartrate 25 mg twice daily.  He presented to the clinic 11/28/23 for follow-up evaluation and stated he did not tolerate Entresto  or  metoprolol  succinate.  He was frustrated with the medication regimen.  He felt he was on too much blood pressure medication.  He denied chest pain and shortness of breath.  He was euvolemic .  I  discontinued his metoprolol  succinate and Entresto .  I prescribed metoprolol  to tartrate 12.5 mg twice daily and lisinopril  2.5 mg daily.  I ordered a BMP in 1 week and planned follow-up in 1 to 2 months.  His lab work was reassuring.  He presentED  to the clinic today for follow-up evaluation and stated he was doing much better with lisinopril  and 12.5 mg of metoprolol  tartrate twice daily.  We reviewed GDMT.  I discussed pathophysiology of ischemic cardiomyopathy.  He expressed understanding.  He reported that he has been walking his dog greater than 1 mile a day.  He denied chest pain.  He  reported some left hip pain.  I planned to repeat his echocardiogram, have him maintain his physical activity and plan follow-up after testing.  Follow-up echocardiogram 02/22/2024 showed LVEF of 30-35%, G1 DD, mild mitral valve regurgitation with no evidence of mitral stenosis and mildly calcified aortic valve.  He was noted to have mild aortic valve regurgitation.  He was admitted with acute urinary retention on 03/12/2024.  Foley catheter was placed and his symptoms resolved.  He reported that this was not a new occurrence and he occasionally needs to straight cath.  He was placed on antibiotics.  Creatinine was slightly worse.  He was placed on p.o. Keflex  and instructed to follow-up with urology.  He presents to the clinic today for follow-up evaluation and states he has been doing well.  He notes that he has been walking his dog 1-1 and half miles per day.  He has been eating well.  He does note that he has been losing some weight.  He has been supplementing his diet with Ensure.  We reviewed his most recent echocardiogram.  He expressed understanding.  We reviewed his most recent emergency department visit.  He denies further episodes of urinary issues.  He reports that he will be following up with GI/oncology for episodes of blood in his stool.  He attributes blood in the stool to previous radiation that he had around his abdomen/pelvis region.  His blood pressure today is 114/52.  He denies episodes of lightheadedness and dizziness.  I will refer to EP for consideration of ICD implant and plan follow-up with general cardiology in 4 to 6 months.  Today he  denies chest pain, shortness of breath, lower extremity edema, fatigue, palpitations, melena, hematuria, hemoptysis, diaphoresis, weakness, presyncope, syncope, orthopnea, and PND.   Home Medications    Prior to Admission medications   Medication Sig Start Date End Date Taking? Authorizing Provider  aspirin  EC 81 MG tablet Take 81 mg by mouth 2 (two) times daily after a meal.    [provider]  furosemide  (LASIX ) 20 MG tablet Take 20 mg by mouth as needed for fluid or edema.    [provider]  metoprolol  succinate (TOPROL  XL) 25 MG 24 hr tablet Take 1 tablet (25 mg total) by mouth daily. 08/01/23   Pietro Redell RAMAN, MD  metoprolol  tartrate (LOPRESSOR ) 25 MG tablet Take 1 tablet (25 mg total) by mouth 2 (two) times daily. 10/23/23 01/21/24  Jerilynn Lamarr HERO, NP  Multiple Vitamins-Minerals (CENTRUM SILVER ULTRA MENS PO) Take 1 tablet by mouth daily.     [provider]  nitroGLYCERIN  (NITROSTAT ) 0.4 MG SL tablet Place  1 tablet (0.4 mg total) under the tongue every 5 (five) minutes as needed. 04/17/17   Pietro Redell RAMAN, MD  pravastatin  (PRAVACHOL ) 20 MG tablet Take 1 tablet (20 mg total) by mouth every evening. 08/01/23   Pietro Redell RAMAN, MD  sacubitril -valsartan  (ENTRESTO ) 24-26 MG Take 1 tablet by mouth 2 (two) times daily. 10/23/23   Jerilynn Lamarr HERO, NP  sacubitril -valsartan  (ENTRESTO ) 24-26 MG Take 1 tablet by mouth 2 (two) times daily. 10/23/23   Jerilynn Lamarr HERO, NP  vitamin B-12 (CYANOCOBALAMIN) 1000 MCG tablet Take 1,000 mcg by mouth daily.    [provider]    Family History    Family History  Problem Relation Age of Onset   Hypertension Father    Breast cancer Neg Hx    Prostate cancer Neg Hx    Colon cancer Neg Hx    Pancreatic cancer Neg Hx    He indicated that his mother is deceased. He indicated that his father is deceased. He indicated that his maternal grandmother is deceased. He indicated that his maternal grandfather is  deceased. He indicated that his paternal grandmother is deceased. He indicated that his paternal grandfather is deceased. He indicated that the status of his neg hx is unknown.  Social History    Social History   Socioeconomic History   Marital status: Married    Spouse name: Not on file   Number of children: Not on file   Years of education: Not on file   Highest education level: Not on file  Occupational History    Comment: retired  Tobacco Use   Smoking status: Former    Current packs/day: 0.00    Average packs/day: 0.5 packs/day for 7.0 years (3.5 ttl pk-yrs)    Types: Cigarettes    Start date: 10/04/1955    Quit date: 10/03/1962    Years since quitting: 61.5   Smokeless tobacco: Never  Vaping Use   Vaping status: Never Used  Substance and Sexual Activity   Alcohol use: Yes    Comment: [employer] [employer]    Drug use: Not Currently   Sexual activity: Not Currently  Other Topics Concern   Not on file  Social History Narrative   Moved from [place] [place] to [place], [place] following divorce to be closer to his brother. Patient has remarried.   Social Drivers of Corporate investment banker Strain: Not on file  Food Insecurity: Low Risk  (03/19/2024)   Received from [facility]   Hunger Vital Sign    Within the past 12 months, you worried that your food would run out before you got money to buy more: Never true    Within the past 12 months, the food you bought just didn't last and you didn't have money to get more. : Never true  Transportation Needs: No Transportation Needs (03/19/2024)   Received from [facility]    In the past 12 months, has lack of reliable transportation kept you from medical appointments, meetings, work or from getting things needed for daily living? : No  Physical Activity: Not on file  Stress: Not on file  Social Connections: Not on file  Intimate Partner Violence: Not on file     Review of Systems    General:  No chills, fever, night  sweats or weight changes.  Cardiovascular:  No chest pain, dyspnea on exertion, edema, orthopnea, palpitations, paroxysmal nocturnal dyspnea. Dermatological: No rash, lesions/masses Respiratory: No cough, dyspnea Urologic: No hematuria, dysuria  Abdominal:   No nausea, vomiting, diarrhea, bright red blood per rectum, melena, or hematemesis Neurologic:  No visual changes, wkns, changes in mental status. All other systems reviewed and are otherwise negative except as noted above.  Physical Exam    VS:  BP (!) 114/52   Pulse 72   Ht 5' 11 (1.803 m)   Wt 143 lb 9.6 oz (65.1 kg)   SpO2 98%   BMI 20.03 kg/m  , BMI Body mass index is 20.03 kg/m. GEN: Well nourished, well developed, in no acute distress. HEENT: normal. Neck: Supple, no JVD, carotid bruits, or masses. Cardiac: RRR, no murmurs, rubs, or gallops. No clubbing, cyanosis, edema.  Radials/DP/PT 2+ and equal bilaterally.  Respiratory:  Respirations regular and unlabored, clear to auscultation bilaterally. GI: Soft, nontender, nondistended, BS + x 4. MS: no deformity or atrophy. Skin: warm and dry, no rash. Neuro:  Strength and sensation are intact. Psych: Normal affect.  Accessory Clinical Findings    Recent Labs: 09/11/2023: ALT 13 03/12/2024: BUN 36; Creatinine, Ser 1.23; Hemoglobin 10.4; Platelets 286; Potassium 4.3; Sodium 132   Recent Lipid Panel    Component Value Date/Time   CHOL 163 02/28/2023 0838   TRIG 97 02/28/2023 0838   HDL 56 02/28/2023 0838   CHOLHDL 2.9 02/28/2023 0838   CHOLHDL 3.9 01/21/2016 1027   VLDL 31 (H) 01/21/2016 1027   LDLCALC 89 02/28/2023 0838         ECG personally reviewed by me today-none today.EKG Interpretation Date/Time:  Tuesday April 02 2024 14:23:36 EDT Ventricular Rate:  72 PR Interval:  156 QRS Duration:  130 QT Interval:  428 QTC Calculation: 468 R Axis:   -83  Text Interpretation: Normal sinus rhythm with sinus arrhythmia Right bundle branch block Left anterior  fascicular block Bifascicular block Septal infarct (cited on or before 09-Mar-2016) When compared with ECG of 01-Aug-2023 14:30, Significant changes have occurred Confirmed by Emelia Hazy [phone]) on 04/02/2024 2:26:11 PM   Echocardiogram 09/11/2023  1. Left ventricular ejection fraction, by estimation, is 30 to 35%. The  left ventricle has moderately decreased function. The left ventricle  demonstrates regional wall motion abnormalities with mid to apical septal  and anterior akinesis, akinesis of the   true apex. No LV thrombus noted (prominent apical trabeculations). Left  ventricular diastolic parameters are consistent with Grade I diastolic  dysfunction (impaired relaxation).   2. Right ventricular systolic function is normal. The right ventricular  size is normal. There is normal pulmonary artery systolic pressure. The  estimated right ventricular systolic pressure is 29.0 mmHg.   3. Left atrial size was mildly dilated.   4. The mitral valve is normal in structure. Mild mitral valve  regurgitation. No evidence of mitral stenosis.   5. The aortic valve is tricuspid. There is mild calcification of the  aortic valve. Aortic valve regurgitation is trivial. No aortic stenosis is  present.   6. The inferior vena cava is normal in size with greater than 50%  respiratory variability, suggesting right atrial pressure of 3 mmHg.    Echocardiogram 02/22/2024  IMPRESSIONS     1. Left ventricular ejection fraction, by estimation, is 30 to 35%. The  left ventricle has moderately decreased function. The left ventricle  demonstrates regional wall motion abnormalities (see scoring  diagram/findings for description). Left ventricular   diastolic parameters are consistent with Grade I diastolic dysfunction  (impaired relaxation).   2. Right ventricular systolic function is normal. The right ventricular  size is normal.  There is normal pulmonary artery systolic pressure. The  estimated right  ventricular systolic pressure is 30.2 mmHg.   3. The mitral valve is degenerative. Mild mitral valve regurgitation. No  evidence of mitral stenosis.   4. The aortic valve is grossly normal. There is mild calcification of the  aortic valve. There is mild thickening of the aortic valve. Aortic valve  regurgitation is mild. Aortic valve sclerosis/calcification is present,  without any evidence of aortic  stenosis.   5. The inferior vena cava is normal in size with greater than 50%  respiratory variability, suggesting right atrial pressure of 3 mmHg.   Conclusion(s)/Recommendation(s): No left ventricular mural or apical  thrombus/thrombi.   FINDINGS   Left Ventricle: Left ventricular ejection fraction, by estimation, is 30  to 35%. The left ventricle has moderately decreased function. The left  ventricle demonstrates regional wall motion abnormalities. Definity   contrast agent was given IV to delineate  the left ventricular endocardial borders. The left ventricular internal  cavity size was normal in size. There is borderline left ventricular  hypertrophy. Left ventricular diastolic parameters are consistent with  Grade I diastolic dysfunction (impaired  relaxation).     LV Wall Scoring:  The mid and distal anterior septum, mid inferoseptal segment, and apical  anterior segment are akinetic. The mid and distal lateral wall, mid and  distal inferior wall, mid anterolateral segment, and mid anterior segment  are  hypokinetic.   Right Ventricle: The right ventricular size is normal. No increase in  right ventricular wall thickness. Right ventricular systolic function is  normal. There is normal pulmonary artery systolic pressure. The tricuspid  regurgitant velocity is 2.61 m/s, and   with an assumed right atrial pressure of 3 mmHg, the estimated right  ventricular systolic pressure is 30.2 mmHg.   Left Atrium: Left atrial size was normal in size.   Right Atrium: Right atrial size  was normal in size.   Pericardium: There is no evidence of pericardial effusion.   Mitral Valve: The mitral valve is degenerative in appearance. Mild mitral  valve regurgitation. No evidence of mitral valve stenosis.   Tricuspid Valve: The tricuspid valve is normal in structure. Tricuspid  valve regurgitation is mild . No evidence of tricuspid stenosis.   Aortic Valve: The aortic valve is grossly normal. There is mild  calcification of the aortic valve. There is mild thickening of the aortic  valve. Aortic valve regurgitation is mild. Aortic regurgitation PHT  measures 641 msec. Aortic valve  sclerosis/calcification is present, without any evidence of aortic  stenosis. Aortic valve mean gradient measures 4.0 mmHg. Aortic valve peak  gradient measures 7.1 mmHg. Aortic valve area, by VTI measures 2.96 cm.   Pulmonic Valve: The pulmonic valve was normal in structure. Pulmonic valve  regurgitation is trivial. No evidence of pulmonic stenosis.   Aorta: The aortic root and ascending aorta are structurally normal, with  no evidence of dilitation.   Venous: The inferior vena cava is normal in size with greater than 50%  respiratory variability, suggesting right atrial pressure of 3 mmHg.   IAS/Shunts: No atrial level shunt detected by color flow Doppler.     Assessment & Plan   1.  Ischemic cardiomyopathy-weight today 143.6  Breathing stable.  EF noted to be 30-35% on echocardiogram 09/11/2023.  Did not tolerate Entresto  or spironolactone .  Metoprolol  succinate was also not tolerated.   Repeat echocardiogram showed LVEF of 30-35% and G1 DD.  Details above.  Unable to uptitrate GDMT due  to blood pressure. Continue lisinopril  2.5 mg, metoprolol  tartrate 12.5 mg twice daily Refer to EP for consideration of ICD implant  Essential hypertension-BP today 114/52 Continue metoprolol , lisinopril  Heart healthy low-sodium diet Maintain blood pressure log  Coronary artery disease-denies anginal  symptoms and exertional type chest discomfort.SABRA  Has had multiple cardiac catheterizations with most recent 6/17 showing 50% proximal RCA, 45% proximal-distal LAD and 50% circumflex Continue aspirin , metoprolol , pravastatin  Heart healthy low-sodium diet Maintain physical activity  Disposition: Follow-up with Dr. Pietro or me in 4 to 6 months.  Josefa HERO. Tomio Kirk NP-C     04/02/2024, 2:49 PM [facility] [address] Office [phone] Fax [phone]    I spent 13 minutes examining this patient, reviewing medications, and using patient centered shared decision making involving their cardiac care.   I spent  20 minutes reviewing past medical history,  medications, and prior cardiac tests.

## 2024-04-02 ENCOUNTER — Encounter: Payer: Self-pay | Admitting: General Practice

## 2024-04-02 ENCOUNTER — Ambulatory Visit: Attending: Cardiology | Admitting: General Practice

## 2024-04-02 VITALS — BP 114/52 | HR 72 | Ht 71.0 in | Wt 143.6 lb

## 2024-04-02 DIAGNOSIS — I255 Ischemic cardiomyopathy: Secondary | ICD-10-CM | POA: Diagnosis not present

## 2024-04-02 DIAGNOSIS — I2510 Atherosclerotic heart disease of native coronary artery without angina pectoris: Secondary | ICD-10-CM | POA: Diagnosis not present

## 2024-04-02 DIAGNOSIS — I10 Essential (primary) hypertension: Secondary | ICD-10-CM | POA: Diagnosis present

## 2024-04-02 NOTE — Patient Instructions (Signed)
 Medication Instructions:  Your physician recommends that you continue on your current medications as directed. Please refer to the Current Medication list given to you today.  *If you need a refill on your cardiac medications before your next appointment, please call your pharmacy*  Lab Work: NONE If you have labs (blood work) drawn today and your tests are completely normal, you will receive your results only by: MyChart Message (if you have MyChart) OR A paper copy in the mail If you have any lab test that is abnormal or we need to change your treatment, we will call you to review the results.  Testing/Procedures: NONE  Follow-Up: At [facility], you and your health needs are our priority.  As part of our continuing mission to provide you with exceptional heart care, our providers are all part of one team.  This team includes your primary Cardiologist (physician) and Advanced Practice Providers or APPs (Physician Assistants and Nurse Practitioners) who all work together to provide you with the care you need, when you need it.  Your next appointment:   6 month(s)  Provider:   Redell Shallow, MD   We recommend signing up for the patient portal called MyChart.  Sign up information is provided on this After Visit Summary.  MyChart is used to connect with patients for Virtual Visits (Telemedicine).  Patients are able to view lab/test results, encounter notes, upcoming appointments, etc.  Non-urgent messages can be sent to your provider as well.   To learn more about what you can do with MyChart, go to [email or web address].

## 2024-04-24 ENCOUNTER — Ambulatory Visit: Attending: Internal Medicine | Admitting: Internal Medicine

## 2024-04-24 VITALS — BP 96/42 | HR 60 | Ht 68.0 in | Wt 144.6 lb

## 2024-04-24 DIAGNOSIS — I5022 Chronic systolic (congestive) heart failure: Secondary | ICD-10-CM | POA: Insufficient documentation

## 2024-04-24 NOTE — Patient Instructions (Signed)

## 2024-04-24 NOTE — Progress Notes (Signed)
 HPI Mr. Ronald Kaiser is referred by Josefa Beauvais, NP to consider ICD insertion for primary prevention of ventricular arrhythmias. The patient is a pleasant 89 yo man with CAD, s/p multiple MI's, EF of 30%, HTN, and chronic systolic heart failure, class 2. He has been treated on maximal GDMT. He feels well and has not had syncope. No chest pain or sob.  Allergies  Allergen Reactions   Bactrim [Sulfamethoxazole-Trimethoprim] Hives, Shortness Of Breath and Swelling   Amoxicillin-Pot Clavulanate Diarrhea   Crestor  [Rosuvastatin  Calcium ] Other (See Comments)    Back pain,grogginess and dizziness   Zetia  [Ezetimibe ] Other (See Comments)    Back pain, grogginess and dizziness     Current Outpatient Medications  Medication Sig Dispense Refill   aspirin  EC 81 MG tablet Take 81 mg by mouth daily. Swallow whole.     CALCIUM  PO Take by mouth daily.     enzalutamide (XTANDI) 40 MG capsule Take 4 capsules every day by oral route.     ferrous sulfate 325 (65 FE) MG EC tablet Take 325 mg by mouth.     furosemide  (LASIX ) 20 MG tablet Take 20 mg by mouth as needed for fluid or edema.     lisinopril  (ZESTRIL ) 5 MG tablet Take 0.5 tablets (2.5 mg total) by mouth daily. 90 tablet 3   metoprolol  tartrate (LOPRESSOR ) 25 MG tablet Take 0.5 tablets (12.5 mg total) by mouth 2 (two) times daily. 90 tablet 3   Multiple Vitamins-Minerals (CENTRUM SILVER ULTRA MENS PO) Take 1 tablet by mouth daily.      nitrofurantoin, macrocrystal-monohydrate, (MACROBID) 100 MG capsule Take 100 mg by mouth daily.     nitroGLYCERIN  (NITROSTAT ) 0.4 MG SL tablet Place 1 tablet (0.4 mg total) under the tongue every 5 (five) minutes as needed. 25 tablet 12   pravastatin  (PRAVACHOL ) 20 MG tablet Take 1 tablet (20 mg total) by mouth every evening. 90 tablet 3   vitamin B-12 (CYANOCOBALAMIN) 1000 MCG tablet Take 1,000 mcg by mouth daily.     No current facility-administered medications for this visit.     Past Medical History:   Diagnosis Date   AKI (acute kidney injury) (HCC) 11/12/2018   CAD (coronary artery disease)    CHF (congestive heart failure) (HCC)    Dyspnea    Foley catheter in place    HTN (hypertension)    Hyperlipidemia    Myocardial infarction (HCC) 2000   REPORTS HE HAS HAD 5 HEART ATTACKS BUT SOME OF THE WERE NOT SERIOUS    Obesity    Prostate cancer (HCC)     ROS:   All systems reviewed and negative except as noted in the HPI.   Past Surgical History:  Procedure Laterality Date   CARDIAC CATHETERIZATION N/A 03/09/2016   Procedure: Left Heart Cath and Coronary Angiography;  Surgeon: Victory LELON Sharps, MD;  Location: [facility] INVASIVE CV LAB;  Service: Cardiovascular;  Laterality: N/A;   CYSTOSCOPY W/ RETROGRADES Bilateral 08/19/2020   Procedure: BILATERAL RETROGRADE PYELOGRAM;  Surgeon: Cam Morene LELON, MD;  Location: [facility];  Service: Urology;  Laterality: Bilateral;   RADIATION TO PROSTATE     Stenting of LAD  2000   TONSILLECTOMY     TRANSURETHRAL RESECTION OF PROSTATE N/A 03/01/2019   Procedure: TRANSURETHRAL RESECTION OF THE PROSTATE (TURP);  Surgeon: Cam Morene LELON, MD;  Location: [facility];  Service: Urology;  Laterality: N/A;  75 MINS   TRANSURETHRAL RESECTION OF PROSTATE N/A 08/19/2020   Procedure: TRANSURETHRAL RESECTION  OF THE PROSTATE (TURP);  Surgeon: Cam Morene ORN, MD;  Location: [facility];  Service: Urology;  Laterality: N/A;     Family History  Problem Relation Age of Onset   Hypertension Father    Breast cancer Neg Hx    Prostate cancer Neg Hx    Colon cancer Neg Hx    Pancreatic cancer Neg Hx      Social History   Socioeconomic History   Marital status: Married    Spouse name: Not on file   Number of children: Not on file   Years of education: Not on file   Highest education level: Not on file  Occupational History    Comment: retired  Tobacco Use   Smoking status: Former    Current packs/day: 0.00    Average packs/day: 0.5 packs/day for 7.0 years  (3.5 ttl pk-yrs)    Types: Cigarettes    Start date: 10/04/1955    Quit date: 10/03/1962    Years since quitting: 61.6   Smokeless tobacco: Never  Vaping Use   Vaping status: Never Used  Substance and Sexual Activity   Alcohol use: Yes    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 Patient has remarried.   Social Drivers of Corporate investment banker Strain: Not on file  Food Insecurity: Low Risk  (03/19/2024)   Received from [facility]   Hunger Vital Sign    Within the past 12 months, you worried that your food would run out before you got money to buy more: Never true    Within the past 12 months, the food you bought just didn't last and you didn't have money to get more. : Never true  Transportation Needs: No Transportation Needs (03/19/2024)   Received from [facility]    In the past 12 months, has lack of reliable transportation kept you from medical appointments, meetings, work or from getting things needed for daily living? : No  Physical Activity: Not on file  Stress: Not on file  Social Connections: Not on file  Intimate Partner Violence: Not on file     BP (!) 96/42   Pulse 60   Ht 5' 8 (1.727 m)   Wt 144 lb 9.6 oz (65.6 kg)   SpO2 95%   BMI 21.99 kg/m   Physical Exam:  Well appearing elderly man, NAD HEENT: Unremarkable Neck:  No JVD, no thyromegally Lymphatics:  No adenopathy Back:  No CVA tenderness Lungs:  Clear with no wheezes HEART:  Regular rate rhythm, no murmurs, no rubs, no clicks Abd:  soft, positive bowel sounds, no organomegally, no rebound, no guarding Ext:  2 plus pulses, no edema, no cyanosis, no clubbing Skin:  No rashes no nodules Neuro:  CN II through XII intact, motor grossly intact  EKG - NSR  Assess/Plan: Chronic systolic  heart failure - his symptoms are class 2. We will not plan ICD insertion as we have no data on primary prevention patients at his advanced age.  CAD - he denies resting angina. We will continue his current meds.   Danelle Eugenio Dollins,MD

## 2024-04-30 NOTE — Progress Notes (Signed)
 GI Clinic Note  Dear Dr. Laneta Tanda Agent, [name]   Thank you for allowing us  to see Ronald Kaiser in the [facility] on 04/30/24.  Please review our records below:  History of Present Illness The patient is an 89 year old male who presents for evaluation of rectal bleeding.  He has been experiencing intermittent rectal bleeding since 2024, which he first noticed after undergoing radiation therapy for prostate cancer. The blood in his stool is bright red and can be found both mixed with the stool and on the tissue. Bowel movements vary, sometimes occurring three times a day, other times once or twice, and occasionally not at all. The consistency of his stool also varies, ranging from small pellets to long, flat pieces that break off. He does not experience any straining during bowel movements and generally feels completely evacuated afterward.  He has been dealing with abdominal pain for the past year, which is particularly noticeable when walking his dog or sitting at his computer. He also reports hip pain. He has lost about 40 pounds over the past two years, dropping from 185 to 145 pounds, and struggles to gain weight despite a good appetite. He reports no heartburn, indigestion, nausea, vomiting, bloating, or distention. He occasionally experiences difficulty swallowing when eating too quickly or not chewing thoroughly.  He was advised to take iron  supplements due to anemia but stopped after realizing his multivitamin already contained 100% of the recommended daily iron  intake. He was told that his rectal bleeding could be due to hemorrhoids. His last colonoscopy in 2005 was negative, although a polyp was removed.  He has had two TURPs and is currently taking Xtandi  for advanced prostate cancer, which sometimes causes dizziness. He takes baby aspirin  daily for heart health and does not use ibuprofen, Advil, or Aleve. He has been using a rollator since a  fall where he hit his head on the floor, which has helped reduce his back and hip pain. He occasionally experiences a sensation like an electric shock down his leg from his hip.  PAST SURGICAL HISTORY: Two TURPs  SOCIAL HISTORY Exercise: Walks the dog in the morning and at night. Diet: Eats a lot of fruit. Coffee/Tea/Caffeine-containing Drinks: Drinks coffee.     Wt Readings from Last 3 Encounters:  04/30/24 66 kg (145 lb 9.6 oz)  03/19/24 66.2 kg (146 lb)  01/25/24 68.3 kg (150 lb 9.6 oz)    ROS:  A complete review of systems was otherwise negative, except as noted in the HPI.  Past Medical History: Medical History[1]  Social History: Social History[2]  Family History: family history includes Alzheimer's disease in his mother; Heart disease in his brother and father; Hypertension in his father and mother.  Medications: Current Medications[3]  Allergies: Allergies[4]  Physical Exam: Blood pressure (!) 96/58, pulse 65, weight 66 kg (145 lb 9.6 oz), SpO2 100%. General:  No acute distress, alert and oriented x 3, well-groomed HEENT:  PERRLA, EOMI, no scleral icterus, no lymphadenopathy, MMM Cardiovascular:  Regular rate and rhythm, no murmurs, rubs or gallops Respiratory:  Clear to auscultation bilaterally, no wheezes, rales, or rhonchi Abdomen:  Soft, nondistended, nontender, normoactive bowel sounds, no hepatomegaly, no splenomegaly Extremities:  Warm and well-perfused, no clubbing, cyanosis, or edema  Labs: Lab Results  Component Value Date   ALT 6 (L) 01/25/2024   AST 13 01/25/2024   BILITOT 0.4 01/25/2024   No results found for: INR, PROTIME Lab Results  Component Value Date  CREATININE 0.84 03/19/2024   BUN 24 03/19/2024   NA 134 (L) 03/19/2024   K 4.5 03/19/2024   CL 99 03/19/2024   CO2 25 03/19/2024   Lab Results  Component Value Date   WBC 6.00 03/19/2024   HGB 9.3 (L) 03/19/2024   HCT 27.8 (L) 03/19/2024   MCV 92.4 03/19/2024   PLT 285  03/19/2024    Assessment & Plan 1. Rectal bleeding: Concern for possible radiation proctitis but differential also includes benign outlet source, diverticular bleeding, neoplasia, AVMs. Iron  stores are significantly depleted, with hemoglobin level at 9.3. It is important to rule out colon cancer. A colonoscopy and endoscopy should be scheduled within the next 4 to 6 weeks. Start taking Metamucil, 1 to 2 tablespoons daily. Resume iron  supplements and continue current medications. Contact the office if fiber alone does not regulate bowel habits or if experiencing issues with iron  supplements.  Follow-up: after procedures    I have personally spent 35 minutes involved in face-to-face and non-face-to-face activities for this patient on the day of the visit.  We thoroughly discussed the patient's diagnosis, pertinent exam, radiographic findings, natural history of the diagnosis, and plan moving forward.   Professional time spent includes the following activities, in addition to those noted in the documentation: CBC, iron  profile.   All questions from the patient were answered.  I instructed the patient to call my office should problems arise.  I agree that the documentation is accurate and complete.   Charmaine FALCON. Ollis [name]       [1] Past Medical History: Diagnosis Date  . Acute on chronic systolic CHF (congestive heart failure)    (CMD) 11/11/2018  . Acute renal failure 11/27/2018  . Allergic   . Anxiety   . Arthritis   . Asthma (CMD)   . Basal cell carcinoma (BCC) of skin of nose 01/24/2018  . Bladder outlet obstruction 11/27/2018  . Bronchitis 10/30/2021  . Cancer    (CMD)    Prostate Cancer  . Cataract   . CHF (congestive heart failure)    (CMD)   . Clotting disorder (CMD)   . GI (gastrointestinal bleed)   . Heart attack    (CMD)   . History of oral surgery   . History of prostate cancer 2003   radiation treatment.  metastatic recurrence in 2022 trteated with radiation  . HL  (hearing loss)   . Hypertension   . Irritable bowel syndrome   . JDMS (juvenile dermatomyositis)    (CMD) 11/25/2011  . Lower respiratory infection 10/30/2021  . Scoliosis   . Statin intolerance 10/05/2022   Myalgias       . Urinary tract infection   . Visual impairment   [2] Social History Socioeconomic History  . Marital status: Married  Tobacco Use  . Smoking status: Former    Current packs/day: 1.00    Average packs/day: 1 pack/day for 5.0 years (5.0 ttl pk-yrs)    Types: Cigarettes  . Smokeless tobacco: Never  Substance and Sexual Activity  . Alcohol use: Not Currently  . Drug use: Never  . Sexual activity: Not Currently    Partners: Female   Social Drivers of Health   Food Insecurity: Low Risk  (04/30/2024)   Food vital sign   . Within the past 12 months, you worried that your food would run out before you got money to buy more: Never true   . Within the past 12 months, the food you bought just didn't last  and you didn't have money to get more: Never true  Transportation Needs: No Transportation Needs (04/30/2024)   Transportation   . In the past 12 months, has lack of reliable transportation kept you from medical appointments, meetings, work or from getting things needed for daily living? : No  Safety: Low Risk  (04/30/2024)   Safety   . How often does anyone, including family and friends, physically hurt you?: Never   . How often does anyone, including family and friends, insult or talk down to you?: Never   . How often does anyone, including family and friends, threaten you with harm?: Never   . How often does anyone, including family and friends, scream or curse at you?: Never  Living Situation: Low Risk  (04/30/2024)   Living Situation   . What is your living situation today?: I have a steady place to live   . Think about the place you live. Do you have problems with any of the following? Choose all that apply:: None/None on this list  [3] Current Outpatient  Medications  Medication Sig Dispense Refill  . aspirin  81 mg EC tablet Take  by mouth Once Daily.    . ferrous sulfate  325 mg (65 mg iron ) EC tablet Take 325 mg by mouth daily with breakfast.    . lisinopriL  (PRINIVIL ) 2.5 mg tablet Take 1 tablet (2.5 mg total) by mouth daily. Written by cardiology.    . metoprolol  tartrate (LOPRESSOR ) 25 mg tablet 25 mg.    . multivitamine, geriatric, (Vitrum Senior) tab Take  by mouth.    . nitroglycerin  (NITROSTAT ) 0.4 mg SL tablet Place  under the tongue.    . pravastatin  (PRAVACHOL ) 20 mg tablet Take 20 mg by mouth Once Daily.    . Xtandi  40 mg tablet      No current facility-administered medications for this visit.  [4] Allergies Allergen Reactions  . Sulfamethoxazole-Trimethoprim Hives, Rash and Swelling  . Amoxicillin-Pot Clavulanate Diarrhea  . Ezetimibe  Myalgias    Constipation.  . Lupron Depot [Leuprolide] Other (See Comments)    depression  . Rosuvastatin  Other (See Comments) and Myalgias    And constipation

## 2024-05-26 ENCOUNTER — Other Ambulatory Visit: Payer: Self-pay

## 2024-05-26 ENCOUNTER — Emergency Department (HOSPITAL_COMMUNITY)

## 2024-05-26 ENCOUNTER — Encounter (HOSPITAL_COMMUNITY): Payer: Self-pay

## 2024-05-26 ENCOUNTER — Inpatient Hospital Stay (HOSPITAL_COMMUNITY): Admission: EM | Admit: 2024-05-26 | Discharge: 2024-05-30 | DRG: 393 | Disposition: A | Discharge status: Home Health

## 2024-05-26 DIAGNOSIS — D62 Acute posthemorrhagic anemia: Secondary | ICD-10-CM | POA: Diagnosis present

## 2024-05-26 DIAGNOSIS — K56699 Other intestinal obstruction unspecified as to partial versus complete obstruction: Secondary | ICD-10-CM | POA: Diagnosis present

## 2024-05-26 DIAGNOSIS — K922 Gastrointestinal hemorrhage, unspecified: Secondary | ICD-10-CM | POA: Diagnosis not present

## 2024-05-26 DIAGNOSIS — D630 Anemia in neoplastic disease: Secondary | ICD-10-CM | POA: Diagnosis present

## 2024-05-26 DIAGNOSIS — E782 Mixed hyperlipidemia: Secondary | ICD-10-CM | POA: Diagnosis present

## 2024-05-26 DIAGNOSIS — I7143 Infrarenal abdominal aortic aneurysm, without rupture: Secondary | ICD-10-CM | POA: Diagnosis present

## 2024-05-26 DIAGNOSIS — K5521 Angiodysplasia of colon with hemorrhage: Secondary | ICD-10-CM | POA: Diagnosis present

## 2024-05-26 DIAGNOSIS — K648 Other hemorrhoids: Secondary | ICD-10-CM | POA: Diagnosis not present

## 2024-05-26 DIAGNOSIS — I509 Heart failure, unspecified: Principal | ICD-10-CM

## 2024-05-26 DIAGNOSIS — W19XXXA Unspecified fall, initial encounter: Secondary | ICD-10-CM | POA: Diagnosis present

## 2024-05-26 DIAGNOSIS — I252 Old myocardial infarction: Secondary | ICD-10-CM

## 2024-05-26 DIAGNOSIS — S0001XA Abrasion of scalp, initial encounter: Secondary | ICD-10-CM | POA: Diagnosis present

## 2024-05-26 DIAGNOSIS — I951 Orthostatic hypotension: Secondary | ICD-10-CM | POA: Diagnosis present

## 2024-05-26 DIAGNOSIS — Z7982 Long term (current) use of aspirin: Secondary | ICD-10-CM

## 2024-05-26 DIAGNOSIS — I2510 Atherosclerotic heart disease of native coronary artery without angina pectoris: Secondary | ICD-10-CM | POA: Diagnosis present

## 2024-05-26 DIAGNOSIS — I340 Nonrheumatic mitral (valve) insufficiency: Secondary | ICD-10-CM | POA: Diagnosis present

## 2024-05-26 DIAGNOSIS — K625 Hemorrhage of anus and rectum: Secondary | ICD-10-CM | POA: Diagnosis present

## 2024-05-26 DIAGNOSIS — I480 Paroxysmal atrial fibrillation: Secondary | ICD-10-CM | POA: Diagnosis present

## 2024-05-26 DIAGNOSIS — E8809 Other disorders of plasma-protein metabolism, not elsewhere classified: Secondary | ICD-10-CM | POA: Diagnosis present

## 2024-05-26 DIAGNOSIS — I255 Ischemic cardiomyopathy: Secondary | ICD-10-CM | POA: Diagnosis present

## 2024-05-26 DIAGNOSIS — Z8546 Personal history of malignant neoplasm of prostate: Secondary | ICD-10-CM

## 2024-05-26 DIAGNOSIS — Z9181 History of falling: Secondary | ICD-10-CM

## 2024-05-26 DIAGNOSIS — R109 Unspecified abdominal pain: Secondary | ICD-10-CM | POA: Diagnosis present

## 2024-05-26 DIAGNOSIS — I5022 Chronic systolic (congestive) heart failure: Secondary | ICD-10-CM | POA: Diagnosis present

## 2024-05-26 DIAGNOSIS — Z79899 Other long term (current) drug therapy: Secondary | ICD-10-CM

## 2024-05-26 DIAGNOSIS — Z9079 Acquired absence of other genital organ(s): Secondary | ICD-10-CM

## 2024-05-26 DIAGNOSIS — I10 Essential (primary) hypertension: Secondary | ICD-10-CM | POA: Diagnosis present

## 2024-05-26 DIAGNOSIS — E861 Hypovolemia: Secondary | ICD-10-CM | POA: Diagnosis present

## 2024-05-26 DIAGNOSIS — E860 Dehydration: Secondary | ICD-10-CM | POA: Diagnosis present

## 2024-05-26 DIAGNOSIS — K627 Radiation proctitis: Secondary | ICD-10-CM | POA: Diagnosis present

## 2024-05-26 DIAGNOSIS — K5750 Diverticulosis of both small and large intestine without perforation or abscess without bleeding: Secondary | ICD-10-CM | POA: Diagnosis present

## 2024-05-26 DIAGNOSIS — R32 Unspecified urinary incontinence: Secondary | ICD-10-CM | POA: Diagnosis present

## 2024-05-26 DIAGNOSIS — I4510 Unspecified right bundle-branch block: Secondary | ICD-10-CM | POA: Diagnosis present

## 2024-05-26 DIAGNOSIS — R54 Age-related physical debility: Secondary | ICD-10-CM | POA: Diagnosis present

## 2024-05-26 DIAGNOSIS — K5730 Diverticulosis of large intestine without perforation or abscess without bleeding: Secondary | ICD-10-CM | POA: Diagnosis not present

## 2024-05-26 DIAGNOSIS — D509 Iron deficiency anemia, unspecified: Secondary | ICD-10-CM | POA: Diagnosis not present

## 2024-05-26 DIAGNOSIS — E872 Acidosis, unspecified: Secondary | ICD-10-CM | POA: Diagnosis not present

## 2024-05-26 DIAGNOSIS — I110 Hypertensive heart disease with heart failure: Secondary | ICD-10-CM | POA: Diagnosis present

## 2024-05-26 DIAGNOSIS — Y842 Radiological procedure and radiotherapy as the cause of abnormal reaction of the patient, or of later complication, without mention of misadventure at the time of the procedure: Secondary | ICD-10-CM | POA: Diagnosis present

## 2024-05-26 DIAGNOSIS — Z8249 Family history of ischemic heart disease and other diseases of the circulatory system: Secondary | ICD-10-CM

## 2024-05-26 DIAGNOSIS — Z955 Presence of coronary angioplasty implant and graft: Secondary | ICD-10-CM

## 2024-05-26 DIAGNOSIS — R531 Weakness: Secondary | ICD-10-CM | POA: Diagnosis present

## 2024-05-26 DIAGNOSIS — K5710 Diverticulosis of small intestine without perforation or abscess without bleeding: Secondary | ICD-10-CM | POA: Diagnosis not present

## 2024-05-26 DIAGNOSIS — Z888 Allergy status to other drugs, medicaments and biological substances status: Secondary | ICD-10-CM

## 2024-05-26 DIAGNOSIS — Z87891 Personal history of nicotine dependence: Secondary | ICD-10-CM

## 2024-05-26 DIAGNOSIS — E871 Hypo-osmolality and hyponatremia: Secondary | ICD-10-CM | POA: Diagnosis present

## 2024-05-26 DIAGNOSIS — Z923 Personal history of irradiation: Secondary | ICD-10-CM

## 2024-05-26 DIAGNOSIS — R195 Other fecal abnormalities: Secondary | ICD-10-CM | POA: Diagnosis not present

## 2024-05-26 DIAGNOSIS — Z87440 Personal history of urinary (tract) infections: Secondary | ICD-10-CM

## 2024-05-26 DIAGNOSIS — C7951 Secondary malignant neoplasm of bone: Secondary | ICD-10-CM | POA: Diagnosis present

## 2024-05-26 DIAGNOSIS — K3189 Other diseases of stomach and duodenum: Secondary | ICD-10-CM | POA: Diagnosis not present

## 2024-05-26 DIAGNOSIS — R9389 Abnormal findings on diagnostic imaging of other specified body structures: Secondary | ICD-10-CM | POA: Diagnosis present

## 2024-05-26 DIAGNOSIS — G8929 Other chronic pain: Secondary | ICD-10-CM | POA: Diagnosis present

## 2024-05-26 DIAGNOSIS — D649 Anemia, unspecified: Principal | ICD-10-CM | POA: Diagnosis present

## 2024-05-26 DIAGNOSIS — Z881 Allergy status to other antibiotic agents status: Secondary | ICD-10-CM

## 2024-05-26 DIAGNOSIS — Z880 Allergy status to penicillin: Secondary | ICD-10-CM

## 2024-05-26 DIAGNOSIS — C61 Malignant neoplasm of prostate: Secondary | ICD-10-CM | POA: Diagnosis present

## 2024-05-26 DIAGNOSIS — K529 Noninfective gastroenteritis and colitis, unspecified: Secondary | ICD-10-CM | POA: Diagnosis present

## 2024-05-26 DIAGNOSIS — I5020 Unspecified systolic (congestive) heart failure: Secondary | ICD-10-CM | POA: Diagnosis not present

## 2024-05-26 DIAGNOSIS — K319 Disease of stomach and duodenum, unspecified: Secondary | ICD-10-CM

## 2024-05-26 LAB — COMPREHENSIVE METABOLIC PANEL WITH GFR
ALT: 12 U/L (ref 0–44)
AST: 20 U/L (ref 15–41)
Albumin: 2.2 g/dL — ABNORMAL LOW (ref 3.5–5.0)
Alkaline Phosphatase: 67 U/L (ref 38–126)
Anion gap: 9 (ref 5–15)
BUN: 41 mg/dL — ABNORMAL HIGH (ref 8–23)
CO2: 24 mmol/L (ref 22–32)
Calcium: 8.7 mg/dL — ABNORMAL LOW (ref 8.9–10.3)
Chloride: 98 mmol/L (ref 98–111)
Creatinine, Ser: 1 mg/dL (ref 0.61–1.24)
GFR, Estimated: >60 mL/min (ref >60)
Glucose, Bld: 99 mg/dL (ref 70–99)
Potassium: 3.9 mmol/L (ref 3.5–5.1)
Sodium: 131 mmol/L — ABNORMAL LOW (ref 135–145)
Total Bilirubin: 0.6 mg/dL (ref 0.0–1.2)
Total Protein: 5.7 g/dL — ABNORMAL LOW (ref 6.5–8.1)

## 2024-05-26 LAB — LIPASE, BLOOD: Lipase: 27 U/L (ref 11–51)

## 2024-05-26 LAB — CBC
HCT: 26.6 % — ABNORMAL LOW (ref 39.0–52.0)
Hemoglobin: 8.8 g/dL — ABNORMAL LOW (ref 13.0–17.0)
MCH: 32 pg (ref 26.0–34.0)
MCHC: 33.1 g/dL (ref 30.0–36.0)
MCV: 96.7 fL (ref 80.0–100.0)
Platelets: 261 K/uL (ref 150–400)
RBC: 2.75 MIL/uL — ABNORMAL LOW (ref 4.22–5.81)
RDW: 15.8 % — ABNORMAL HIGH (ref 11.5–15.5)
WBC: 7.5 K/uL (ref 4.0–10.5)
nRBC: 0 % (ref 0.0–0.2)

## 2024-05-26 LAB — POC OCCULT BLOOD, ED: Fecal Occult Bld: POSITIVE — AB

## 2024-05-26 LAB — RESP PANEL BY RT-PCR (RSV, FLU A&B, COVID)  RVPGX2
Influenza A by PCR: NEGATIVE
Influenza B by PCR: NEGATIVE
Resp Syncytial Virus by PCR: NEGATIVE
SARS Coronavirus 2 by RT PCR: NEGATIVE

## 2024-05-26 LAB — BRAIN NATRIURETIC PEPTIDE: B Natriuretic Peptide: 345.7 pg/mL — ABNORMAL HIGH (ref 0.0–100.0)

## 2024-05-26 MED ORDER — ENZALUTAMIDE 40 MG PO CAPS: 160.0000 mg | ORAL_CAPSULE | Freq: Every day | ORAL | Status: DC

## 2024-05-26 MED ORDER — ACETAMINOPHEN 650 MG RE SUPP: 650.0000 mg | Freq: Four times a day (QID) | RECTAL | Status: DC | PRN

## 2024-05-26 MED ORDER — LACTATED RINGERS IV SOLN: INTRAVENOUS | Status: DC

## 2024-05-26 MED ORDER — LACTATED RINGERS IV BOLUS
500.0000 mL | Freq: Once | INTRAVENOUS | Status: AC
  Administered 2024-05-26: 500 mL via INTRAVENOUS

## 2024-05-26 MED ORDER — PANTOPRAZOLE SODIUM 40 MG IV SOLR
40.0000 mg | Freq: Once | INTRAVENOUS | Status: AC
  Administered 2024-05-26: 40 mg via INTRAVENOUS
  Filled 2024-05-26: qty 10

## 2024-05-26 MED ORDER — POLYETHYLENE GLYCOL 3350 17 G PO PACK
17.0000 g | PACK | Freq: Every day | ORAL | Status: DC | PRN
  Filled 2024-05-26: qty 1

## 2024-05-26 MED ORDER — PRAVASTATIN SODIUM 10 MG PO TABS
20.0000 mg | ORAL_TABLET | Freq: Every evening | ORAL | Status: DC
  Administered 2024-05-27 – 2024-05-29 (×3): 20 mg via ORAL
  Filled 2024-05-26 (×3): qty 2

## 2024-05-26 MED ORDER — ONDANSETRON HCL 4 MG/2ML IJ SOLN
4.0000 mg | Freq: Once | INTRAMUSCULAR | Status: AC
  Administered 2024-05-26: 4 mg via INTRAVENOUS
  Filled 2024-05-26: qty 2

## 2024-05-26 MED ORDER — PANTOPRAZOLE SODIUM 40 MG IV SOLR
40.0000 mg | Freq: Two times a day (BID) | INTRAVENOUS | Status: DC
  Administered 2024-05-26 – 2024-05-27 (×3): 40 mg via INTRAVENOUS
  Filled 2024-05-26 (×3): qty 10

## 2024-05-26 MED ORDER — ACETAMINOPHEN 325 MG PO TABS
650.0000 mg | ORAL_TABLET | Freq: Four times a day (QID) | ORAL | Status: DC | PRN
Start: 2024-05-26 — End: 2024-05-30

## 2024-05-26 MED ORDER — IOHEXOL 350 MG/ML SOLN
75.0000 mL | Freq: Once | INTRAVENOUS | Status: AC | PRN
  Administered 2024-05-26: 75 mL via INTRAVENOUS

## 2024-05-26 NOTE — ED Notes (Signed)
 Patient transported to CT

## 2024-05-26 NOTE — Hospital Course (Addendum)
 Aspirin  81 mg tablet daily-taking  Calcium  p.o. 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 Prentice Blush  Level of function: Independent in all ADLs and iADLs  Support: Great support in his brother and wife  Substance: No substance use, used to drink with wife from time to time  He states that he is not sure what is going on with his stomach. He states that he woke up this morning and he was feeling terrible. His wife has been bugging him to go to the hospital for the last 3 days. He has been dizzy and having some issues with diarrhea for the past few days. He did not want to go. Yesterday he had a nurse friend who come over yesterday who said that he looked dehydrated. She went to go to the store to get him some pedialyte. He drank some of yesterday. He states that it cut down the diarrhea. He felt like he was getting better and then he was getting out of bed this morning and he felt very dizzy. His wife then told him you are going to the hospital. He reports that he had a 2 syncopal episodes in the past 2 weeks. One time he had head impact. He went out for a little bit and came back to each time. He states that he got up and managed to get to the other side of the bathroom. He asked if his breath smells funny. He brushed his teeth this morning, ate some blueberries and yogurt, and then had nothing since then this morning.   he has been  having some loose stools over the last 10 days and they are dark black. now they are dark brown. Sometimes he does have little stools like little pellets. He does report having off and on bright red blood in his stool. He had lasgna and ziti one night and had to throw up but otherwise he notes that he has not ben nauseated. He states that all the time these epsidoes are on and off. he had radiation for prostate cancer   He does report having some abdominal pain. He does not report any epigastric pain. He does not feel pain when he is not hungry. No pain with urge to go to defecate. He has seen a GI doctor on 04/30/2024 who states that he is to get a scope in the next month. His wife states that he is confused all the time but he does not feel that. He denies chest pain. He did state that his wife state that he has been short of breath yesterday. He does not that he has fatigue. He denies any urinary symptoms. He notes that when he wake  up in the morning  and he has pain, he goes to the recliner and feels fine. No recent NSAID use. He states that he does not have a great appetite.   Iron  Studies 03/19/2024  Iron  74 Transferrin 276 Ferritin 18 TIBC 395

## 2024-05-26 NOTE — ED Provider Notes (Signed)
 [place] [facility]ERGENCY DEPARTMENT AT [facility] Provider Note   CSN: [id number] Arrival date & time: 05/26/24  1122     Patient presents with: Hypotension, Nausea, Weakness, Emesis, and Near Syncope   [facility] Kaiser is a 89 y.o. male with past medical history significant for hypertension, hyperlipidemia, CHF, prostate cancer status post TURP who presents with multiple complaints today.  Patient brought in from home, he has had some nausea, and at least 12 episodes of high-volume diarrhea for the last few days.  He reports feeling some dizziness, lightheadedness, having intermittent near syncope/syncope, and falls.  He reports most recent fall on Tuesday of this week.  He did report hitting his head.  He does not take a blood thinner.  He endorses some abdominal pain, denies vomiting.  He reports that he has had some sinus congestion for a couple of weeks and reports that he has not had any taste until a day or 2 ago when his taste returned.  Denies any cough, recent sick exposures. denies any fever, chills.  Denies any chest pain, shortness of breath.  He tells me that his metoprolol  medication was recently decreased to 12-1/2 mg in the morning and 12/2 mg at night, but for the last few days he has just been taking the whole thing in the morning because he had been doing that for years prior.    Weakness Associated symptoms: near-syncope and vomiting   Emesis Near Syncope       Prior to Admission medications   Medication Sig Start Date End Date Taking? Authorizing Provider  aspirin  EC 81 MG tablet Take 81 mg by mouth daily. Swallow whole.    [provider]  CALCIUM  PO Take by mouth daily.    [provider]  enzalutamide  (XTANDI ) 40 MG capsule Take 4 capsules every day by oral route.    [provider]  ferrous sulfate  325 (65 FE) MG EC tablet Take 325 mg by mouth.    [provider]  furosemide  (LASIX ) 20 MG tablet Take 20 mg by mouth  as needed for fluid or edema.    [provider]  lisinopril  (ZESTRIL ) 5 MG tablet Take 0.5 tablets (2.5 mg total) by mouth daily. 11/28/23   Emelia Josefa HERO, NP  metoprolol  tartrate (LOPRESSOR ) 25 MG tablet Take 0.5 tablets (12.5 mg total) by mouth 2 (two) times daily. 11/28/23   Emelia Josefa HERO, NP  Multiple Vitamins-Minerals (CENTRUM SILVER ULTRA MENS PO) Take 1 tablet by mouth daily.     [provider]  nitrofurantoin, macrocrystal-monohydrate, (MACROBID) 100 MG capsule Take 100 mg by mouth daily. 10/23/23   [provider]  nitroGLYCERIN  (NITROSTAT ) 0.4 MG SL tablet Place 1 tablet (0.4 mg total) under the tongue every 5 (five) minutes as needed. 04/17/17   Pietro Redell RAMAN, MD  pravastatin  (PRAVACHOL ) 20 MG tablet Take 1 tablet (20 mg total) by mouth every evening. 08/01/23   Pietro Redell RAMAN, MD  vitamin B-12 (CYANOCOBALAMIN) 1000 MCG tablet Take 1,000 mcg by mouth daily.    [provider]    Allergies: Bactrim [sulfamethoxazole-trimethoprim], Amoxicillin-pot clavulanate, Crestor  [rosuvastatin  calcium ], and Zetia  [ezetimibe ]    Review of Systems  Cardiovascular:  Positive for near-syncope.  Gastrointestinal:  Positive for vomiting.  Neurological:  Positive for weakness.  All other systems reviewed and are negative.   Updated Vital Signs BP (!) 104/58 (BP Location: Right Arm)   Pulse (!) 55   Temp 98 F (36.7 C) (  Oral)   Resp (!) 22   SpO2 100%   Physical Exam Vitals and nursing note reviewed.  Constitutional:      General: He is not in acute distress.    Appearance: Normal appearance.  HENT:     Head: Normocephalic and atraumatic.  Eyes:     General:        Right eye: No discharge.        Left eye: No discharge.  Cardiovascular:     Rate and Rhythm: Normal rate and regular rhythm.     Heart sounds: No murmur heard.    No friction rub. No gallop.  Pulmonary:     Effort: Pulmonary effort is normal.     Breath sounds: Normal breath  sounds.     Comments: No wheezing, rhonchi, stridor, rales Abdominal:     General: Bowel sounds are normal.     Palpations: Abdomen is soft.     Comments: Diffuse tenderness to palpation of the abdomen, no rebound, rigidity, guarding, no distention noted.  Genitourinary:    Comments: Some dark stool with blood streaking in the rectal vault Musculoskeletal:     Comments: Intact strength 5/5 of bilateral upper and lower extremities.  Skin:    General: Skin is warm and dry.     Capillary Refill: Capillary refill takes less than 2 seconds.     Comments: Some remote appearing scrapes on the scalp, no large hematoma  Neurological:     Mental Status: He is alert and oriented to person, place, and time.  Psychiatric:        Mood and Affect: Mood normal.        Behavior: Behavior normal.     (all labs ordered are listed, but only abnormal results are displayed) Labs Reviewed  COMPREHENSIVE METABOLIC PANEL WITH GFR - Abnormal; Notable for the following components:      Result Value   Sodium 131 (*)    BUN 41 (*)    Calcium  8.7 (*)    Total Protein 5.7 (*)    Albumin 2.2 (*)    All other components within normal limits  CBC - Abnormal; Notable for the following components:   RBC 2.75 (*)    Hemoglobin 8.8 (*)    HCT 26.6 (*)    RDW 15.8 (*)    All other components within normal limits  BRAIN NATRIURETIC PEPTIDE - Abnormal; Notable for the following components:   B Natriuretic Peptide 345.7 (*)    All other components within normal limits  POC OCCULT BLOOD, [facility] - Abnormal; Notable for the following components:   Fecal Occult Bld POSITIVE (*)    All other components within normal limits  RESP PANEL BY RT-PCR (RSV, FLU A&B, COVID)  RVPGX2  LIPASE, BLOOD  URINALYSIS, ROUTINE W REFLEX MICROSCOPIC    EKG: EKG Interpretation Date/Time:  Sunday May 26 2024 12:08:08 EDT Ventricular Rate:  64 PR Interval:  167 QRS Duration:  134 QT Interval:  394 QTC Calculation: 407 R  Axis:   -38  Text Interpretation: Sinus rhythm Atrial premature complex Right bundle branch block Confirmed by Cottie Cough [phone]) on 05/26/2024 12:15:57 PM  Radiology: CT Head Wo Contrast Result Date: 05/26/2024 CLINICAL DATA:  Blunt trauma to head and neck.  Pain. EXAM: CT HEAD WITHOUT CONTRAST CT CERVICAL SPINE WITHOUT CONTRAST TECHNIQUE: Multidetector CT imaging of the head and cervical spine was performed following the standard protocol without intravenous contrast. Multiplanar CT image reconstructions of the cervical spine were also  generated. RADIATION DOSE REDUCTION: This exam was performed according to the departmental dose-optimization program which includes automated exposure control, adjustment of the mA and/or kV according to patient size and/or use of iterative reconstruction technique. COMPARISON:  None Available. FINDINGS: CT HEAD FINDINGS Brain: No evidence of intracranial hemorrhage, acute infarction, hydrocephalus, extra-axial collection, or mass lesion/mass effect. Mild-to-moderate diffuse cerebral and cerebellar atrophy noted. Vascular:  No hyperdense vessel or other acute findings. Skull: No evidence of fracture or other significant bone abnormality. Sinuses/Orbits:  No acute findings. Other: None. CT CERVICAL SPINE FINDINGS Alignment: Normal. Skull base and vertebrae: No acute fracture. No primary bone lesion or focal pathologic process. Soft tissues and spinal canal: No prevertebral fluid or swelling. No visible canal hematoma. Disc levels: Moderate to severe degenerative disc disease is seen at all cervical levels, greatest at C6-7. Mild multilevel facet DJD is seen on the left. Upper chest: No acute findings. Other: Sclerotic lesions are seen involving the right lateral mass of C1, right T1 lamina, and left T2 lamina,, consistent with sclerotic bone metastases given patient's history of prostate carcinoma. IMPRESSION: No acute intracranial abnormality.  Cerebral and cerebellar  atrophy. No evidence of cervical spine fracture or subluxation. Sclerotic bone metastases involving the right lateral mass of C1, right T1 lamina, and left T2 lamina. Electronically Signed   By: Norleen DELENA Kil M.D.   On: 05/26/2024 16:40   CT Cervical Spine Wo Contrast Result Date: 05/26/2024 CLINICAL DATA:  Blunt trauma to head and neck.  Pain. EXAM: CT HEAD WITHOUT CONTRAST CT CERVICAL SPINE WITHOUT CONTRAST TECHNIQUE: Multidetector CT imaging of the head and cervical spine was performed following the standard protocol without intravenous contrast. Multiplanar CT image reconstructions of the cervical spine were also generated. RADIATION DOSE REDUCTION: This exam was performed according to the departmental dose-optimization program which includes automated exposure control, adjustment of the mA and/or kV according to patient size and/or use of iterative reconstruction technique. COMPARISON:  None Available. FINDINGS: CT HEAD FINDINGS Brain: No evidence of intracranial hemorrhage, acute infarction, hydrocephalus, extra-axial collection, or mass lesion/mass effect. Mild-to-moderate diffuse cerebral and cerebellar atrophy noted. Vascular:  No hyperdense vessel or other acute findings. Skull: No evidence of fracture or other significant bone abnormality. Sinuses/Orbits:  No acute findings. Other: None. CT CERVICAL SPINE FINDINGS Alignment: Normal. Skull base and vertebrae: No acute fracture. No primary bone lesion or focal pathologic process. Soft tissues and spinal canal: No prevertebral fluid or swelling. No visible canal hematoma. Disc levels: Moderate to severe degenerative disc disease is seen at all cervical levels, greatest at C6-7. Mild multilevel facet DJD is seen on the left. Upper chest: No acute findings. Other: Sclerotic lesions are seen involving the right lateral mass of C1, right T1 lamina, and left T2 lamina,, consistent with sclerotic bone metastases given patient's history of prostate carcinoma.  IMPRESSION: No acute intracranial abnormality.  Cerebral and cerebellar atrophy. No evidence of cervical spine fracture or subluxation. Sclerotic bone metastases involving the right lateral mass of C1, right T1 lamina, and left T2 lamina. Electronically Signed   By: Norleen DELENA Kil M.D.   On: 05/26/2024 16:40   CT ABDOMEN PELVIS W CONTRAST Result Date: 05/26/2024 EXAM: CT ABDOMEN AND PELVIS WITH CONTRAST 05/26/2024 02:56:21 PM TECHNIQUE: CT of the abdomen and pelvis was performed with the administration of intravenous contrast (75mL iohexol  (OMNIPAQUE ) 350 MG/ML injection). Multiplanar reformatted images are provided for review. Automated exposure control, iterative reconstruction, and/or weight-based adjustment of the mA/kV was utilized to reduce the radiation dose  to as low as reasonably achievable. COMPARISON: PET/CT 05/27/2023 CLINICAL HISTORY: Abdominal pain, acute, nonlocalized. FINDINGS: LOWER CHEST: Dependent atelectasis in the visualized lung bases. LIVER: Normal size and contour. GALLBLADDER AND BILE DUCTS: No wall thickening. No cholelithiasis. No biliary ductal dilatation. SPLEEN: Normal size. No focal lesion. PANCREAS: No mass. No ductal dilatation. ADRENAL GLANDS: Normal appearance. No mass. KIDNEYS, URETERS AND BLADDER: No stones in the kidneys or ureters. No hydronephrosis. No perinephric or periureteral stranding. Urinary bladder is partially distended with diffuse wall thickening, containing gas bubbles and coarse calcifications in dependent aspect of the lumen . GI AND BOWEL: Stomach demonstrates no acute abnormality. There is no bowel obstruction. No bowel wall thickening. PERITONEUM AND RETROPERITONEUM: No ascites. No free air. VASCULATURE: 3.1 cm fusiform infrarenal abdominal aortic aneurysm without evidence of leak or impending rupture, terminating above the bifurcation. Coronary and aortic calcified plaque. LYMPH NODES: No lymphadenopathy. REPRODUCTIVE ORGANS: No significant abnormality.  BONES AND SOFT TISSUES: Chronic severe T12 vertebral compression deformity. Multilevel spondylotic changes in the lumbar spine with mild levoscoliosis. Sclerotic lesion in the L1 vertebral body as seen previously. IMPRESSION: 1. 3.1 cm fusiform infrarenal abdominal aortic aneurysm. According to the ACR 2013 and SVS 2018 guidelines, the finding is consistent with a small abdominal aortic aneurysm (3.03.4 cm), and the recommendation is surveillance with imaging every 3 years. 2. Diffuse urinary bladder wall thickening with gas bubbles and coarse calcifications in its dependent aspect. Electronically signed by: Katheleen Faes MD 05/26/2024 03:56 PM EDT RP Workstation: [id number]   DG Chest Portable 1 View Result Date: 05/26/2024 CLINICAL DATA:  Shortness of breath. EXAM: PORTABLE CHEST 1 VIEW COMPARISON:  Chest radiograph dated 11/13/2018. FINDINGS: The heart size and mediastinal contours are unchanged. Aortic atherosclerosis. No focal consolidation, pleural effusion, or pneumothorax. Redemonstrated sclerotic lesion in the left posterior seventh rib, which demonstrated radiotracer uptake on the prior PET-CT dated 12/13/2022. IMPRESSION: 1. No acute cardiopulmonary findings. 2. Redemonstrated sclerotic lesion in the left posterior seventh rib, which demonstrated radiotracer uptake on the prior PET-CT dated 12/13/2022, compatible with history of osseous metastasis. Electronically Signed   By: Harrietta Sherry M.D.   On: 05/26/2024 12:44     Procedures   Medications Ordered in the [facility]  ondansetron  (ZOFRAN ) injection 4 mg (4 mg Intravenous Given 05/26/24 1158)  lactated ringers  bolus 500 mL (0 mLs Intravenous Stopped 05/26/24 1720)  iohexol  (OMNIPAQUE ) 350 MG/ML injection 75 mL (75 mLs Intravenous Contrast Given 05/26/24 1456)  pantoprazole  (PROTONIX ) injection 40 mg (40 mg Intravenous Given 05/26/24 1710)    Clinical Course as of 05/26/24 1734  Sun May 26, 2024  1154 89 yo male here with several complaints.  Reports fatigue and loss of taste for 2 weeks. Weakness and fall in the bathroom a few days ago.  3 days of watery diarrhea.  Poor appetite.  Lightheadedness.  He has had chronic abdominal pain worse with walking for months but really worse the past few days.  On exam he has diffuse abdominal tenderness and guarding. Afebrile.  BP okay here.  Will need CT imaging including abdomen - labs, Covid testing, UA (hx of recurring UTI's).   [MT]    Clinical Course User Index [MT] Trifan, Donnice PARAS, MD                                 Medical Decision Making Amount and/or Complexity of Data Reviewed Labs: ordered. Radiology: ordered.  Risk Prescription drug  management.   This patient is a 89 y.o. male  who presents to the [facility] for concern of dizziness, hypotensions, abdominal pain.   Differential diagnoses prior to evaluation: The emergent differential diagnosis includes, but is not limited to,  BPPV, vestibular migraine, head trauma, AVM, intracranial tumor, multiple sclerosis, drug-related, CVA, orthostatic hypotension, sepsis, hypoglycemia, electrolyte disturbance, anemia, anxiety, The causes of generalized abdominal pain include but are not limited to AAA, mesenteric ischemia, appendicitis, diverticulitis, DKA, gastritis, gastroenteritis, AMI, nephrolithiasis, pancreatitis, peritonitis, adrenal insufficiency,lead poisoning, iron  toxicity, intestinal ischemia, constipation, UTI,SBO/LBO, splenic rupture, biliary disease, IBD, IBS, PUD, or hepatitis . This is not an exhaustive differential.   Past Medical History / Co-morbidities / Social History:  hypertension, hyperlipidemia, CHF, prostate cancer status post TURP  Additional history: Chart reviewed. Pertinent results include: Reviewed outpatient GI, cardiology visits, family medicine visits, emergency department visits including lab work, imaging.  Physical Exam: Physical exam performed. The pertinent findings include:  Diffuse tenderness to  palpation of the abdomen, no rebound, rigidity, guarding, no distention noted.   No wheezing, rhonchi, stridor, rales  Intact strength 5/5 of bilateral upper and lower extremities.  Some remote appearing scrapes on the scalp, no large hematoma   Lab Tests/Imaging studies: I personally interpreted labs/imaging and the pertinent results include: CBC notable for worsening anemia, hemoglobin 8.8 from baseline around 10.4 just 2 months ago.  CMP notable for mild hyponatremia, sodium 131.  His BUN is elevated at 41.  Normal creatinine.  Normal lipase.  BNP is mildly elevated at 345.  His Hemoccult is positive.  I am fairly interpreted CT head, C-spine, CT abdomen pelvis, and plain, chest x-ray which shows no evidence of acute intracranial abnormality or intra-abdominal abnormality other than some bony metastasis, diffuse bladder wall thickening. I agree with the radiologist interpretation.  Cardiac monitoring: EKG obtained and interpreted by myself and attending physician which shows: Normal sinus rhythm with right bundle branch block   Medications: I ordered medication including Zofran  for nausea, fluid bolus due to hypotension, Protonix  for GI bleed.  I have reviewed the patients home medicines and have made adjustments as needed.  Consults: Spoke with Dr. Stacia with GI who agrees to consult on patient, recommends PPI, clear liquid diet, will plan for EGD versus colonoscopy in the morning.  Spoke with Dr. Tobie who agrees to admission for syncope, near syncope, GI bleed, hypotension.   Disposition: After consideration of the diagnostic results and the patients response to treatment, I feel that patient would benefit from hospital admission due to hypotension, syncope, near syncope, acute GI bleed, will consult GI to see the patient in the morning.  Final diagnoses:  None    [facility] Discharge Orders     None          Rosan Sherlean VEAR DEVONNA 05/26/24 1734    Cottie Donnice PARAS,  MD 05/26/24 [phone]

## 2024-05-26 NOTE — ED Triage Notes (Signed)
 Patient BIB EMS from home C/O GI symptoms for the past 3 days along with dizziness, weakness, and near syncope that started today. Patient's wife states he has had 12 episodes of diarrhea in the past 3 days. Patient is also hypotensive upon EMS arrival at 82/40. Wife reports new onset dementia, patient just appears forgetful at this time.

## 2024-05-26 NOTE — H&P (Signed)
 Date: 05/26/2024               Patient Name:  Ronald Kaiser MRN: [id number]  DOB: 02-19-1934 Age / Sex: 89 y.o., male   PCP: Tanda Prentice DEL, MD         Medical Service: Internal Medicine Teaching Service         Attending Physician: Dr. Jone Dauphin      First Contact: Armando Rossetti, MD}    Second Contact: Dr. Libby Blanch, DO          Pager Information: First Contact Pager: [phone]   Second Contact Pager: [phone]   SUBJECTIVE   Chief Complaint: Presyncope, diarrhea  History of Present Illness: Ronald Kaiser is a 89 y.o. male with PMH of hypertension, hyperlipidemia, CHF, prostate cancer s/p TURP with radiation, presenting with dizziness, diarrhea, near-syncope, and falls. He reports ~10 days of loose dark stools, initially black, now dark brown with intermittent bright red blood. Also describes occasional pellet-like stools and abdominal discomfort, but no epigastric pain or pain with urge to defecate. One episode of vomiting after pasta but otherwise no nausea. Notes fatigue, poor appetite, and worsening dizziness. Reports two syncopal episodes in the past two weeks, one with head strike and brief loss of consciousness. Wife reports confusion and shortness of breath yesterday, though patient denies chest pain or urinary symptoms. Diarrhea improved somewhat after Pedialyte yesterday. Seen by GI on 04/30/24 with planned endoscopy in one month. No recent NSAID use. Endorses: diarrhea (dark stools, occasional BRBPR), dizziness, syncope 2, abdominal pain, fatigue, poor appetite, possible confusion (per wife), prior hematemesis episode, shortness of breath (yesterday). Denies: chest pain, fever, chills, urinary symptoms, epigastric pain, NSAID use, nausea (except once with lasagna), vomiting, cough.  ED Course: The patient presented with dizziness, near-syncope, abdominal pain, and multiple episodes of diarrhea. On exam, he had diffuse abdominal tenderness without peritoneal signs, stable  strength, and scalp abrasions from recent falls.   Labs: revealed worsening anemia (Hgb 8.8, down from 10.4), mild hyponatremia (Na 131), elevated BUN (41), positive hemoccult, and BNP mildly elevated at 345. Sodium: 131 Potassium: 2.9 BUN: 41 BNP: 245 Hemoglobin: 8.8 FOBT: Positive  Imaging: including CT head, C-spine, abdomen/pelvis, and chest X-ray was personally reviewed and showed no acute intracranial or intra-abdominal abnormality, but did demonstrate bony metastases and diffuse bladder wall thickening. EKG showed NSR with right bundle branch block. Received: IV fluids for hypotension, Zofran  for nausea, and Protonix  for suspected GI bleed.  Consulted:GI  Meds:  Patient reported:  Aspirin  81 mg tablet daily-taking  Calcium  p.o. daily Enzalutamide  40 mg 4 capsules daily  Ferrous Sulfate  325 daily Furosemide  20 mg daily as needed Lisinopril  5 mg daily- taking  Metoprolol  tartrate (Lopressor ) 12.5 mg BID   Multiple vitamin mineral 1 tablet daily Macrobid 100 mg 1 tab daily Pravastatin  20 mg 1 tablet every night Vitamin b12 1000 mcg daily   Current Meds  Medication Sig   aspirin  EC 81 MG tablet Take 81 mg by mouth daily. Swallow whole.   CALCIUM  PO Take 1 tablet by mouth daily.   enzalutamide  (XTANDI ) 40 MG capsule Take 160 mg by mouth daily.   ferrous sulfate  325 (65 FE) MG EC tablet Take 325 mg by mouth daily with breakfast.   furosemide  (LASIX ) 20 MG tablet Take 20 mg by mouth as needed for fluid or edema.   lisinopril  (ZESTRIL ) 5 MG tablet Take 0.5 tablets (2.5 mg total) by mouth daily.   metoprolol  tartrate (LOPRESSOR ) 25  MG tablet Take 0.5 tablets (12.5 mg total) by mouth 2 (two) times daily.   Multiple Vitamins-Minerals (CENTRUM SILVER ULTRA MENS PO) Take 1 tablet by mouth daily.    nitroGLYCERIN  (NITROSTAT ) 0.4 MG SL tablet Place 1 tablet (0.4 mg total) under the tongue every 5 (five) minutes as needed.   pravastatin  (PRAVACHOL ) 20 MG tablet Take 1 tablet (20 mg  total) by mouth every evening.    Past Medical History -Hypertension  -Hyperlipidemia -CHF -CAD history of MI in 2000  -HFrEF echo 02/22/24 (30-35%) -Prostate cancer s/p radiation on Enzalutamide   Past Surgical History Past Surgical History:  Procedure Laterality Date   CARDIAC CATHETERIZATION N/A 03/09/2016   Procedure: Left Heart Cath and Coronary Angiography;  Surgeon: Victory LELON Sharps, MD;  Location: [facility] INVASIVE CV LAB;  Service: Cardiovascular;  Laterality: N/A;   CYSTOSCOPY W/ RETROGRADES Bilateral 08/19/2020   Procedure: BILATERAL RETROGRADE PYELOGRAM;  Surgeon: Cam Morene LELON, MD;  Location: [facility] OR[facility];  Service: Urology;  Laterality: Bilateral;   RADIATION TO PROSTATE     Stenting of LAD  2000   TONSILLECTOMY     TRANSURETHRAL RESECTION OF PROSTATE N/A 03/01/2019   Procedure: TRANSURETHRAL RESECTION OF THE PROSTATE (TURP);  Surgeon: Cam Morene LELON, MD;  Location: [facility];  Service: Urology;  Laterality: N/A;  75 MINS   TRANSURETHRAL RESECTION OF PROSTATE N/A 08/19/2020   Procedure: TRANSURETHRAL RESECTION OF THE PROSTATE (TURP);  Surgeon: Cam Morene LELON, MD;  Location: [facility];  Service: Urology;  Laterality: N/A;     Social:  Lives with: Wife at home Occupation: Retired Probation officer and worked in [employer]   PCP: Dr. Prentice Blush  Level of function: Independent in all ADLs and iADLs  Support: Great support in his brother and wife  Substance: No substance use, used to drink with wife from time to time  Family History:  Family History  Problem Relation Age of Onset   Hypertension Father    Breast cancer Neg Hx    Prostate cancer Neg Hx    Colon cancer Neg Hx    Pancreatic cancer Neg Hx      Allergies: Allergies as of 05/26/2024 - Review Complete 05/26/2024  Allergen Reaction Noted   Bactrim [sulfamethoxazole-trimethoprim] Hives, Shortness Of Breath, and Swelling 01/21/2019   Amoxicillin-pot clavulanate Diarrhea 01/20/2022   Crestor  [rosuvastatin  calcium ]  Other (See Comments) 08/30/2022   Zetia  [ezetimibe ] Other (See Comments) 08/30/2022    Review of Systems: A complete ROS was negative except as per HPI.   OBJECTIVE:   Physical Exam: Blood pressure (!) 104/58, pulse (!) 55, temperature 98 F (36.7 C), temperature source Oral, resp. rate (!) 22, SpO2 100%.  Constitutional: well-appearing, sleeping in the bed in no acute distress HENT: Pale, sunken eye, Some remote appearing scrapes on the scalp, no large hematoma  Eyes: pale conjunctiva, non-erythematous Neck: supple Cardiovascular: regular rate and rhythm Pulmonary/Chest: lungs clear to auscultation bilaterally without crackle Abdominal: soft, mild tenderness in LLQ and umbilical area MSK: normal bulk and tone Neurological: alert & oriented x 3, 5/5 strength in bilateral upper and lower extremities, normal gait Skin: dehydrated, warm and dry  Labs: CBC    Component Value Date/Time   WBC 7.5 05/26/2024 1218   RBC 2.75 (L) 05/26/2024 1218   HGB 8.8 (L) 05/26/2024 1218   HCT 26.6 (L) 05/26/2024 1218   PLT 261 05/26/2024 1218   MCV 96.7 05/26/2024 1218   MCH 32.0 05/26/2024 1218   MCHC 33.1 05/26/2024 1218   RDW  15.8 (H) 05/26/2024 1218   LYMPHSABS 0.6 (L) 03/12/2024 0753   MONOABS 0.7 03/12/2024 0753   EOSABS 0.0 03/12/2024 0753   BASOSABS 0.0 03/12/2024 0753     CMP     Component Value Date/Time   NA 131 (L) 05/26/2024 1218   NA 137 12/13/2023 0944   K 3.9 05/26/2024 1218   CL 98 05/26/2024 1218   CO2 24 05/26/2024 1218   GLUCOSE 99 05/26/2024 1218   BUN 41 (H) 05/26/2024 1218   BUN 25 12/13/2023 0944   CREATININE 1.00 05/26/2024 1218   CREATININE 0.86 03/07/2016 1136   CALCIUM  8.7 (L) 05/26/2024 1218   PROT 5.7 (L) 05/26/2024 1218   PROT 6.5 09/11/2023 1221   ALBUMIN 2.2 (L) 05/26/2024 1218   ALBUMIN 4.2 09/11/2023 1221   AST 20 05/26/2024 1218   ALT 12 05/26/2024 1218   ALKPHOS 67 05/26/2024 1218   BILITOT 0.6 05/26/2024 1218   BILITOT 0.5 09/11/2023  1221   GFRNONAA >60 05/26/2024 1218   GFRNONAA 82 12/25/2014 0810   GFRAA 87 01/02/2020 1122   GFRAA >89 12/25/2014 0810    Imaging:  CT Head Wo Contrast Result Date: 05/26/2024 CLINICAL DATA:  Blunt trauma to head and neck.  Pain. EXAM: CT HEAD WITHOUT CONTRAST CT CERVICAL SPINE WITHOUT CONTRAST TECHNIQUE: Multidetector CT imaging of the head and cervical spine was performed following the standard protocol without intravenous contrast. Multiplanar CT image reconstructions of the cervical spine were also generated. RADIATION DOSE REDUCTION: This exam was performed according to the departmental dose-optimization program which includes automated exposure control, adjustment of the mA and/or kV according to patient size and/or use of iterative reconstruction technique. COMPARISON:  None Available. FINDINGS: CT HEAD FINDINGS Brain: No evidence of intracranial hemorrhage, acute infarction, hydrocephalus, extra-axial collection, or mass lesion/mass effect. Mild-to-moderate diffuse cerebral and cerebellar atrophy noted. Vascular:  No hyperdense vessel or other acute findings. Skull: No evidence of fracture or other significant bone abnormality. Sinuses/Orbits:  No acute findings. Other: None. CT CERVICAL SPINE FINDINGS Alignment: Normal. Skull base and vertebrae: No acute fracture. No primary bone lesion or focal pathologic process. Soft tissues and spinal canal: No prevertebral fluid or swelling. No visible canal hematoma. Disc levels: Moderate to severe degenerative disc disease is seen at all cervical levels, greatest at C6-7. Mild multilevel facet DJD is seen on the left. Upper chest: No acute findings. Other: Sclerotic lesions are seen involving the right lateral mass of C1, right T1 lamina, and left T2 lamina,, consistent with sclerotic bone metastases given patient's history of prostate carcinoma. IMPRESSION: No acute intracranial abnormality.  Cerebral and cerebellar atrophy. No evidence of cervical spine  fracture or subluxation. Sclerotic bone metastases involving the right lateral mass of C1, right T1 lamina, and left T2 lamina. Electronically Signed   By: Norleen DELENA Kil M.D.   On: 05/26/2024 16:40   CT Cervical Spine Wo Contrast Result Date: 05/26/2024 CLINICAL DATA:  Blunt trauma to head and neck.  Pain. EXAM: CT HEAD WITHOUT CONTRAST CT CERVICAL SPINE WITHOUT CONTRAST TECHNIQUE: Multidetector CT imaging of the head and cervical spine was performed following the standard protocol without intravenous contrast. Multiplanar CT image reconstructions of the cervical spine were also generated. RADIATION DOSE REDUCTION: This exam was performed according to the departmental dose-optimization program which includes automated exposure control, adjustment of the mA and/or kV according to patient size and/or use of iterative reconstruction technique. COMPARISON:  None Available. FINDINGS: CT HEAD FINDINGS Brain: No evidence of intracranial hemorrhage, acute  infarction, hydrocephalus, extra-axial collection, or mass lesion/mass effect. Mild-to-moderate diffuse cerebral and cerebellar atrophy noted. Vascular:  No hyperdense vessel or other acute findings. Skull: No evidence of fracture or other significant bone abnormality. Sinuses/Orbits:  No acute findings. Other: None. CT CERVICAL SPINE FINDINGS Alignment: Normal. Skull base and vertebrae: No acute fracture. No primary bone lesion or focal pathologic process. Soft tissues and spinal canal: No prevertebral fluid or swelling. No visible canal hematoma. Disc levels: Moderate to severe degenerative disc disease is seen at all cervical levels, greatest at C6-7. Mild multilevel facet DJD is seen on the left. Upper chest: No acute findings. Other: Sclerotic lesions are seen involving the right lateral mass of C1, right T1 lamina, and left T2 lamina,, consistent with sclerotic bone metastases given patient's history of prostate carcinoma. IMPRESSION: No acute intracranial  abnormality.  Cerebral and cerebellar atrophy. No evidence of cervical spine fracture or subluxation. Sclerotic bone metastases involving the right lateral mass of C1, right T1 lamina, and left T2 lamina. Electronically Signed   By: Norleen DELENA Kil M.D.   On: 05/26/2024 16:40   CT ABDOMEN PELVIS W CONTRAST Result Date: 05/26/2024 EXAM: CT ABDOMEN AND PELVIS WITH CONTRAST 05/26/2024 02:56:21 PM TECHNIQUE: CT of the abdomen and pelvis was performed with the administration of intravenous contrast (75mL iohexol  (OMNIPAQUE ) 350 MG/ML injection). Multiplanar reformatted images are provided for review. Automated exposure control, iterative reconstruction, and/or weight-based adjustment of the mA/kV was utilized to reduce the radiation dose to as low as reasonably achievable. COMPARISON: PET/CT 05/27/2023 CLINICAL HISTORY: Abdominal pain, acute, nonlocalized. FINDINGS: LOWER CHEST: Dependent atelectasis in the visualized lung bases. LIVER: Normal size and contour. GALLBLADDER AND BILE DUCTS: No wall thickening. No cholelithiasis. No biliary ductal dilatation. SPLEEN: Normal size. No focal lesion. PANCREAS: No mass. No ductal dilatation. ADRENAL GLANDS: Normal appearance. No mass. KIDNEYS, URETERS AND BLADDER: No stones in the kidneys or ureters. No hydronephrosis. No perinephric or periureteral stranding. Urinary bladder is partially distended with diffuse wall thickening, containing gas bubbles and coarse calcifications in dependent aspect of the lumen . GI AND BOWEL: Stomach demonstrates no acute abnormality. There is no bowel obstruction. No bowel wall thickening. PERITONEUM AND RETROPERITONEUM: No ascites. No free air. VASCULATURE: 3.1 cm fusiform infrarenal abdominal aortic aneurysm without evidence of leak or impending rupture, terminating above the bifurcation. Coronary and aortic calcified plaque. LYMPH NODES: No lymphadenopathy. REPRODUCTIVE ORGANS: No significant abnormality. BONES AND SOFT TISSUES: Chronic severe  T12 vertebral compression deformity. Multilevel spondylotic changes in the lumbar spine with mild levoscoliosis. Sclerotic lesion in the L1 vertebral body as seen previously. IMPRESSION: 1. 3.1 cm fusiform infrarenal abdominal aortic aneurysm. According to the ACR 2013 and SVS 2018 guidelines, the finding is consistent with a small abdominal aortic aneurysm (3.03.4 cm), and the recommendation is surveillance with imaging every 3 years. 2. Diffuse urinary bladder wall thickening with gas bubbles and coarse calcifications in its dependent aspect. Electronically signed by: Katheleen Faes MD 05/26/2024 03:56 PM EDT RP Workstation: [id number]   DG Chest Portable 1 View Result Date: 05/26/2024 CLINICAL DATA:  Shortness of breath. EXAM: PORTABLE CHEST 1 VIEW COMPARISON:  Chest radiograph dated 11/13/2018. FINDINGS: The heart size and mediastinal contours are unchanged. Aortic atherosclerosis. No focal consolidation, pleural effusion, or pneumothorax. Redemonstrated sclerotic lesion in the left posterior seventh rib, which demonstrated radiotracer uptake on the prior PET-CT dated 12/13/2022. IMPRESSION: 1. No acute cardiopulmonary findings. 2. Redemonstrated sclerotic lesion in the left posterior seventh rib, which demonstrated radiotracer uptake on the prior PET-CT  dated 12/13/2022, compatible with history of osseous metastasis. Electronically Signed   By: Harrietta Sherry M.D.   On: 05/26/2024 12:44     EKG: personally reviewed my interpretation is. Prior EKG  ASSESSMENT & PLAN:   Assessment & Plan by Problem: Principal Problem:   Symptomatic anemia Active Problems:   Mixed hyperlipidemia   Hypertension   CHF (congestive heart failure) (HCC)   Malignant neoplasm of prostate metastatic to bone [facility])  Ronald Kaiser is a 89 y.o. male with PMH of hypertension, hyperlipidemia, CHF, prostate cancer s/p TURP with radiation, presenting with dizziness, diarrhea, near-syncope, and falls. He reports ~10 days of  loose dark stools, initially black, now dark brown with intermittent bright red blood.  #Acute GI Bleed with Anemia: Patient with melena and blood-streaked stool, Hgb 8.8 from baseline 10.4, positive hemoccult, diffuse abdominal tenderness. Concern for possible radiation proctitis but differential also includes benign outlet source, diverticular bleeding, neoplasia, AVMs. Iron  stores are significantly depleted. Iron  studies show depletion (ferritin 18). Likely lower GI source but cannot exclude upper GI bleed. Plan: -GI consulted, potential EGD in the morning -start IV PPI (Protonix ) -NPO after midnight -transfuse PRBC if Hgb <7 or symptomatic, -IV fluids for hemodynamic support(50 cc/ hr for 10 hours)  #Hypotension, Near-Syncope, and Falls Likely secondary to volume depletion and acute blood loss anemia, worsened by diarrhea and metoprolol  overuse. No intracranial injury on CT. Plan: -IV fluids for hemodynamic support(50 cc/ hr for 10 hours) -othostatic vitals -telemetry monitoring  -fall precautions  -monitor BP   #Diarrhea Profuse diarrhea in several days contributing to volume loss and electrolyte derangements. Patient presents with profuse, high-volume diarrhea for several days, associated with dark stools and intermittent bright red blood per rectum. Likely contributing to volume depletion, hypotension, and electrolyte disturbances (hyponatremia, mild BUN elevation). History of prostate cancer radiation raises concern for radiation proctitis, medication-related diarrhea, neoplasia, or other structural lesions. Labs show worsening anemia (Hgb 8.8) and positive hemoccult, suggesting ongoing gastrointestinal blood loss. Plan:  -replete electrolytes  -IV fluids  #CHF/ Hypertension/ Hyperlipidemia BNP mildly elevated at 345, but on exam patient was hypovolemic and also no crackle . BP soft in ED. Plan:  -Hold antihypertensives -cautious IV fluids  #ProstateCancer s/p TURP with Bony  Metastases, Bladder Wall Thickening Findings concerning for progression of disease. Plan:  -Urology/oncology follow-up  -pain control as needed.  #Hyponatremia Likely multifactorial from diarrhea and volume depletion. Plan:  -Gentle correction with IV fluids -monitor BMP.  #Head Trauma / Fall Risk Recent fall with head strike, CT head negative for acute bleed. Plan: Neuro checks, fall precautions, PT/OT evaluation, safe discharge planning.  Best practice: Diet: Clear Liquid, Npo after Midnight VTE: None IVF: LR,50cc/hr for 10 hour Code: Full  Disposition planning: Prior to Admission Living Arrangement: Home, living  Anticipated Discharge Location: Home  Dispo: Admit patient to Observation with expected length of stay less than 2 midnights.  Signed: Bernadine Manos, MD Internal Medicine Resident  05/26/2024, 6:44 PM  On Call pager: [phone]

## 2024-05-27 DIAGNOSIS — C61 Malignant neoplasm of prostate: Secondary | ICD-10-CM

## 2024-05-27 DIAGNOSIS — D509 Iron deficiency anemia, unspecified: Secondary | ICD-10-CM

## 2024-05-27 DIAGNOSIS — K625 Hemorrhage of anus and rectum: Secondary | ICD-10-CM

## 2024-05-27 DIAGNOSIS — R195 Other fecal abnormalities: Secondary | ICD-10-CM

## 2024-05-27 LAB — CBC
HCT: 24.9 % — ABNORMAL LOW (ref 39.0–52.0)
Hemoglobin: 8.3 g/dL — ABNORMAL LOW (ref 13.0–17.0)
MCH: 32.3 pg (ref 26.0–34.0)
MCHC: 33.3 g/dL (ref 30.0–36.0)
MCV: 96.9 fL (ref 80.0–100.0)
Platelets: 230 K/uL (ref 150–400)
RBC: 2.57 MIL/uL — ABNORMAL LOW (ref 4.22–5.81)
RDW: 15.6 % — ABNORMAL HIGH (ref 11.5–15.5)
WBC: 6.4 K/uL (ref 4.0–10.5)
nRBC: 0 % (ref 0.0–0.2)

## 2024-05-27 LAB — IRON AND TIBC
Iron: 49 ug/dL (ref 45–182)
Saturation Ratios: 15 % — ABNORMAL LOW (ref 17.9–39.5)
TIBC: 336 ug/dL (ref 250–450)
UIBC: 287 ug/dL

## 2024-05-27 LAB — BASIC METABOLIC PANEL WITH GFR
Anion gap: 5 (ref 5–15)
BUN: 26 mg/dL — ABNORMAL HIGH (ref 8–23)
CO2: 25 mmol/L (ref 22–32)
Calcium: 8.5 mg/dL — ABNORMAL LOW (ref 8.9–10.3)
Chloride: 102 mmol/L (ref 98–111)
Creatinine, Ser: 0.9 mg/dL (ref 0.61–1.24)
GFR, Estimated: >60 mL/min (ref >60)
Glucose, Bld: 92 mg/dL (ref 70–99)
Potassium: 3.7 mmol/L (ref 3.5–5.1)
Sodium: 132 mmol/L — ABNORMAL LOW (ref 135–145)

## 2024-05-27 LAB — RETICULOCYTES
Immature Retic Fract: 18.6 % — ABNORMAL HIGH (ref 2.3–15.9)
RBC.: 2.58 MIL/uL — ABNORMAL LOW (ref 4.22–5.81)
Retic Count, Absolute: 37.9 K/uL (ref 19.0–186.0)
Retic Ct Pct: 1.5 % (ref 0.4–3.1)

## 2024-05-27 LAB — LACTATE DEHYDROGENASE: LDH: 104 U/L (ref 98–192)

## 2024-05-27 LAB — URINALYSIS, MICROSCOPIC (REFLEX)

## 2024-05-27 LAB — URINALYSIS, ROUTINE W REFLEX MICROSCOPIC
Bilirubin Urine: NEGATIVE
Glucose, UA: NEGATIVE mg/dL
Ketones, ur: NEGATIVE mg/dL
Nitrite: NEGATIVE
Protein, ur: 100 mg/dL — AB
Specific Gravity, Urine: 1.01 (ref 1.005–1.030)
pH: 8.5 — ABNORMAL HIGH (ref 5.0–8.0)

## 2024-05-27 LAB — FERRITIN: Ferritin: 54 ng/mL (ref 24–336)

## 2024-05-27 MED ORDER — SIMETHICONE 80 MG PO CHEW
240.0000 mg | CHEWABLE_TABLET | Freq: Once | ORAL | Status: AC
  Administered 2024-05-27: 240 mg via ORAL
  Filled 2024-05-27: qty 3

## 2024-05-27 MED ORDER — SODIUM CHLORIDE 0.45 % IV SOLN: INTRAVENOUS | Status: AC

## 2024-05-27 MED ORDER — LACTATED RINGERS IV BOLUS: 1000.0000 mL | Freq: Once | INTRAVENOUS | Status: DC

## 2024-05-27 MED ORDER — SODIUM CHLORIDE 0.9 % IV SOLN: INTRAVENOUS | Status: AC

## 2024-05-27 MED ORDER — POLYETHYLENE GLYCOL 3350 17 G PO PACK
119.0000 g | PACK | Freq: Once | ORAL | Status: AC
  Administered 2024-05-27: 119 g via ORAL
  Filled 2024-05-27: qty 7

## 2024-05-27 MED ORDER — LACTATED RINGERS IV BOLUS
500.0000 mL | Freq: Once | INTRAVENOUS | Status: AC
  Administered 2024-05-27: 500 mL via INTRAVENOUS

## 2024-05-27 MED ORDER — BISACODYL 5 MG PO TBEC
10.0000 mg | DELAYED_RELEASE_TABLET | Freq: Once | ORAL | Status: AC
  Administered 2024-05-27: 10 mg via ORAL
  Filled 2024-05-27: qty 2

## 2024-05-27 NOTE — Progress Notes (Signed)
 Consent sign and place in chart. Ongoing nurse witness consent signed by patient.

## 2024-05-27 NOTE — Plan of Care (Signed)

## 2024-05-27 NOTE — H&P (View-Only) (Signed)
 Consultation  Referring Provider: Medicine service/[facility] Primary Care Physician:  Tanda Prentice DEL, MD Primary Gastroenterologist: [facility]  Reason for Consultation: Anemia, iron  deficiency, intermittent rectal bleeding, dark stool, multiple comorbidities  HPI: Ronald Kaiser is a 89 y.o. male with multiple serious comorbidities including coronary artery disease status post several previous MIs, stents, and congestive heart failure with most recent EF 30 to 35% earlier this year.  Also with metastatic prostate cancer, on Xtandi , hypertension, hyperlipidemia and chronic urinary incontinence. Patient presented to the ER yesterday with multiple complaints, he has had progressive fatigue and generalized weakness over the past couple of months.  Apparently at some point last week he had a syncopal or presyncopal episode while he was standing in his bathroom, and hit the back of his head.  He was not aware of any dizziness prior to that episode, no chest pain or shortness of breath recently.  Appetite overall has been poor over the past several months and he has lost weight but is uncertain of the amount.  He says his taste has been off and everything tastes bad for the most part.  He has noticed a pulling type of pain in his abdomen over the past few months which seems to be worse with ambulation.  He has no dysphagia or odynophagia, no nausea or vomiting.  His bowel movements sounds as if they have been alternating over the past many months.  He says he will go from not having any bowel movements to having good bowel movements to having loose or diarrheal stools and this seems to go in spells.  He thinks the Xtandi  contributes to his diarrhea.  He has not had any persistent daily diarrhea over the past couple of weeks.  He has had history of recurrent UTIs and did have a UTI a little over a month ago and took an antibiotic for that.  He says that he had radiation for prostate cancer and ever  since that time has noted blood intermittently with his bowel movements over the past year and a half. CT of the head on admission negative CT cervical spine shows sclerotic lesions in C1, right T1 and left T2 consistent with bone metastases CT of the abdomen and pelvis showed a distended bladder with diffuse wall thickening and gas bubbles as well as coarse calcifications in the dependent portions.  There is a 3.1 cm infrarenal abdominal aortic aneurysm. Review of his labs shows that he had a hemoglobin of 9.3 on 03/19/2024 Yesterday WBC 7.5/hemoglobin 8.8/hematocrit 26.6/MCV 96.2 Outpatient iron  studies 03/19/2024 with serum iron  of 74 ferritin 18  Yesterday BNP 345 Sodium 131/BUN 41/creatinine 1.0/albumin 2.2 LFTs within normal limits  He had been seen by cardiology very recently for consideration of ICD and was not felt to be a candidate due to his advanced age.  He has had a change in his metoprolol  dosing which was decreased, currently taking a half a tablet twice daily.  Patient says he has been having difficulty in the early mornings with weakness and lightheadedness when he first gets up.  He says he feels a bit woozy and will feel that way for an hour or so, once he is able to eat breakfast, sit down for a while then he seems to feel better for the rest of the day.  He has been using his wife's rolling walker at home due to the sensation of wooziness.  Stool was documented to be dark and with streaks of blood per ER  provider yesterday  View of care everywhere shows that he was seen by [facility] on 04/30/2024 in referral for rectal bleeding.  That note states that he had lost about 40 pounds over the past year.  He had also been noted to be iron  deficient.  He was instructed to resume oral iron  supplement on a daily basis, he had Metamucil added for his irregular bowel habits and was to be scheduled for EGD and colonoscopy.  Patient says he has not scheduled that as he was not  sure about transportation to [place].   Past Medical History:  Diagnosis Date   AKI (acute kidney injury) (HCC) 11/12/2018   CAD (coronary artery disease)    CHF (congestive heart failure) (HCC)    Dyspnea    Foley catheter in place    HTN (hypertension)    Hyperlipidemia    Myocardial infarction (HCC) 2000   REPORTS HE HAS HAD 5 HEART ATTACKS BUT SOME OF THE WERE NOT SERIOUS    Obesity    Prostate cancer [facility])     Past Surgical History:  Procedure Laterality Date   CARDIAC CATHETERIZATION N/A 03/09/2016   Procedure: Left Heart Cath and Coronary Angiography;  Surgeon: Victory LELON Sharps, MD;  Location: [facility] INVASIVE CV LAB;  Service: Cardiovascular;  Laterality: N/A;   CYSTOSCOPY W/ RETROGRADES Bilateral 08/19/2020   Procedure: BILATERAL RETROGRADE PYELOGRAM;  Surgeon: Cam Morene LELON, MD;  Location: [facility] OR[facility];  Service: Urology;  Laterality: Bilateral;   RADIATION TO PROSTATE     Stenting of LAD  2000   TONSILLECTOMY     TRANSURETHRAL RESECTION OF PROSTATE N/A 03/01/2019   Procedure: TRANSURETHRAL RESECTION OF THE PROSTATE (TURP);  Surgeon: Cam Morene LELON, MD;  Location: [facility];  Service: Urology;  Laterality: N/A;  75 MINS   TRANSURETHRAL RESECTION OF PROSTATE N/A 08/19/2020   Procedure: TRANSURETHRAL RESECTION OF THE PROSTATE (TURP);  Surgeon: Cam Morene LELON, MD;  Location: [facility];  Service: Urology;  Laterality: N/A;    Prior to Admission medications   Medication Sig Start Date End Date Taking? Authorizing Provider  aspirin  EC 81 MG tablet Take 81 mg by mouth daily. Swallow whole.   Yes [provider]  CALCIUM  PO Take 1 tablet by mouth daily.   Yes [provider]  enzalutamide  (XTANDI ) 40 MG capsule Take 160 mg by mouth daily.   Yes [provider]  ferrous sulfate  325 (65 FE) MG EC tablet Take 325 mg by mouth daily with breakfast.   Yes [provider]  furosemide  (LASIX ) 20 MG tablet Take 20 mg by mouth as needed for fluid or  edema.   Yes [provider]  lisinopril  (ZESTRIL ) 5 MG tablet Take 0.5 tablets (2.5 mg total) by mouth daily. 11/28/23  Yes Emelia Josefa HERO, NP  metoprolol  tartrate (LOPRESSOR ) 25 MG tablet Take 0.5 tablets (12.5 mg total) by mouth 2 (two) times daily. 11/28/23  Yes Cleaver, Josefa HERO, NP  Multiple Vitamins-Minerals (CENTRUM SILVER ULTRA MENS PO) Take 1 tablet by mouth daily.    Yes [provider]  nitroGLYCERIN  (NITROSTAT ) 0.4 MG SL tablet Place 1 tablet (0.4 mg total) under the tongue every 5 (five) minutes as needed. 04/17/17  Yes Pietro Redell RAMAN, MD  pravastatin  (PRAVACHOL ) 20 MG tablet Take 1 tablet (20 mg total) by mouth every evening. 08/01/23  Yes Pietro Redell RAMAN, MD    Current Facility-Administered Medications  Medication Dose Route Frequency Provider Last Rate Last Admin   0.45 % sodium  chloride infusion   Intravenous Continuous Esterwood, Amy S, [name]       0.9 %  sodium chloride  infusion   Intravenous Continuous Esterwood, Amy S, [name]       acetaminophen  (TYLENOL ) tablet 650 mg  650 mg Oral Q6H PRN Tobie Gaines, DO       Or   acetaminophen  (TYLENOL ) suppository 650 mg  650 mg Rectal Q6H PRN Tobie Gaines, DO       bisacodyl  (DULCOLAX) EC tablet 10 mg  10 mg Oral Once Esterwood, Amy S, [name]       enzalutamide  (XTANDI ) capsule 160 mg  160 mg Oral Daily Tobie Gaines, DO       pantoprazole  (PROTONIX ) injection 40 mg  40 mg Intravenous Q12H Patel, Amar, DO   40 mg at 05/26/24 2132   polyethylene glycol (MIRALAX  / GLYCOLAX ) packet 17 g  17 g Oral Daily PRN Tobie Gaines, DO       polyethylene glycol powder (GLYCOLAX /MIRALAX ) container 119 g  119 g Oral Once Esterwood, Amy S, [name]       Followed by   polyethylene glycol powder (GLYCOLAX /MIRALAX ) container 119 g  119 g Oral Once Esterwood, Amy S, [name]       pravastatin  (PRAVACHOL ) tablet 20 mg  20 mg Oral QPM Tobie Gaines, DO       simethicone  (MYLICON) chewable tablet 240 mg  240 mg Oral Once Esterwood, Amy S, [name]        Followed by   simethicone  ARLINA) chewable tablet 240 mg  240 mg Oral Once Esterwood, Amy S, [name]        Allergies as of 05/26/2024 - Review Complete 05/26/2024  Allergen Reaction Noted   Bactrim [sulfamethoxazole-trimethoprim] Hives, Shortness Of Breath, and Swelling 01/21/2019   Amoxicillin-pot clavulanate Diarrhea 01/20/2022   Crestor  [rosuvastatin  calcium ] Other (See Comments) 08/30/2022   Zetia  [ezetimibe ] Other (See Comments) 08/30/2022    Family History  Problem Relation Age of Onset   Hypertension Father    Breast cancer Neg Hx    Prostate cancer Neg Hx    Colon cancer Neg Hx    Pancreatic cancer Neg Hx     Social History   Socioeconomic History   Marital status: Married    Spouse name: Not on file   Number of children: Not on file   Years of education: Not on file   Highest education level: Not on file  Occupational History    Comment: retired  Tobacco Use   Smoking status: Former    Current packs/day: 0.00    Average packs/day: 0.5 packs/day for 7.0 years (3.5 ttl pk-yrs)    Types: Cigarettes    Start date: 10/04/1955    Quit date: 10/03/1962    Years since quitting: 61.6   Smokeless tobacco: Never  Vaping Use   Vaping status: Never Used  Substance and Sexual Activity   Alcohol use: Yes    Comment: OCC BEER AND WINE    Drug use: Not Currently   Sexual activity: Not Currently  Other Topics Concern   Not on file  Social History Narrative   Moved from [facility] to [place], [place] following divorce to be closer to his brother. Patient has remarried.   Social Drivers of Corporate investment banker Strain: Not on file  Food Insecurity: No Food Insecurity (05/26/2024)   Hunger Vital Sign    Worried About Running Out of Food in the Last Year: Never true    Ran Out of Food in  the Last Year: Never true  Transportation Needs: No Transportation Needs (05/26/2024)   PRAPARE - Administrator, Civil Service (Medical): No    Lack of Transportation (Non-Medical):  No  Physical Activity: Not on file  Stress: Not on file  Social Connections: Moderately Isolated (05/26/2024)   Social Connection and Isolation Panel    Frequency of Communication with Friends and Family: More than three times a week    Frequency of Social Gatherings with Friends and Family: More than three times a week    Attends Religious Services: Never    Database administrator or Organizations: No    Attends Banker Meetings: Never    Marital Status: Married  Catering manager Violence: Not At Risk (05/26/2024)   Humiliation, Afraid, Rape, and Kick questionnaire    Fear of Current or Ex-Partner: No    Emotionally Abused: No    Physically Abused: No    Sexually Abused: No    Review of Systems: Pertinent positive and negative review of systems were noted in the above HPI section.  All other review of systems was otherwise negative.   Physical Exam: Vital signs in last 24 hours: Temp:  [97.4 F (36.3 C)-98.3 F (36.8 C)] 98.1 F (36.7 C) (08/25 0728) Pulse Rate:  [52-100] 52 (08/25 0728) Resp:  [15-22] 16 (08/25 0728) BP: (65-105)/(43-79) 93/51 (08/25 0728) SpO2:  [95 %-100 %] 97 % (08/25 0728) Last BM Date : 05/27/24 General:   Alert,  Well-developed, thin frail appearing elderly white male pleasant and cooperative in NAD Head:  Normocephalic and atraumatic. Eyes:  Sclera clear, no icterus.   Conjunctiva pink. Ears:  Normal auditory acuity. Nose:  No deformity, discharge,  or lesions. Mouth:  No deformity or lesions.   Neck:  Supple; no masses or thyromegaly. Lungs:  Clear throughout to auscultation.   No wheezes, crackles, or rhonchi.  Heart:  Regular rate and rhythm; no murmurs, clicks, rubs,  or gallops. Abdomen:  Soft, no focal abdominal tenderness, BS active,nonpalp mass or hsm.  No audible bruit Rectal: Not done, noted to have dark stool with streaks of heme per ER yesterday Msk:  Symmetrical without gross deformities. . Pulses:  Normal pulses  noted. Extremities:  Without clubbing or edema. Neurologic:  Alert and  oriented x4;  grossly normal neurologically. Skin:  Intact without significant lesions or rashes.. Psych:  Alert and cooperative. Normal mood and affect.  Intake/Output from previous day: 08/24 0701 - 08/25 0700 In: 462.2 [I.V.:462.2] Out: -  Intake/Output this shift: No intake/output data recorded.  Lab Results: Recent Labs    05/26/24 1218 05/27/24 0733  WBC 7.5 6.4  HGB 8.8* 8.3*  HCT 26.6* 24.9*  PLT 261 230   BMET Recent Labs    05/26/24 1218 05/27/24 0701  NA 131* 132*  K 3.9 3.7  CL 98 102  CO2 24 25  GLUCOSE 99 92  BUN 41* 26*  CREATININE 1.00 0.90  CALCIUM  8.7* 8.5*   LFT Recent Labs    05/26/24 1218  PROT 5.7*  ALBUMIN 2.2*  AST 20  ALT 12  ALKPHOS 67  BILITOT 0.6   PT/INR No results for input(s): LABPROT, INR in the last 72 hours. Hepatitis Panel No results for input(s): HEPBSAG, HCVAB, HEPAIGM, HEPBIGM in the last 72 hours.     IMPRESSION:  #11 89 year old white male admitted with progressive fatigue and weakness, this is in the setting of poor appetite over the past several months, gradual weight  loss, and a couple of falls.  He did have an episode of syncope or presyncope last week while standing in the bathroom.  I do not think that his presyncopal episode secondary to GI bleeding as he has had a slow drift in his hemoglobin.  Patient has multiple other comorbidities which need to be considered regarding episode of syncope.  Also has progressive metastatic prostate cancer likely contributing to his overall decline in status over the past several months.  Query if Xtandi  may be causing dizziness.  #2 anemia, iron  deficiency, heme positive stool and patient report of intermittent rectal bleeding over the past year or so.  This is in the setting of known prostate cancer status post previous radiation. Rule out radiation proctitis as source for chronic GI  blood loss, will also need to rule out occult neoplasm (not seen on CT), AVMs, chronic gastropathy.  #3 metastatic prostate cancer-now has evidence of spinal mets on cervical spine CT-on Xtandi   #4 coronary artery disease status post several prior MIs, stents, no current anticoagulation.  #5 congestive heart failure with EF 30 to 35%  #6 alternating bowel habits with episodes of diarrhea, normal bowel movements and no bowel movements. Doubt infectious, spec medications contributing i.e. Xtandi  which has side effect of diarrhea  #7 hypertension #8 hypoalbuminemia  PLAN: Full liquid breakfast, then clear liquid diet Discussion with the patient today, he would like to have any GI workup needed to be done while he is here and I think that is appropriate given his advanced age, frailty etc. Will schedule for EGD and colonoscopy with Dr. Leigh for tomorrow 05/28/2024.  Both procedures were discussed in detail with the patient including indications risks and benefits and he is agreeable to proceed.  Trend hemoglobin, transfuse if indicated Okay for oral iron  supplementation for now 325 twice daily  Check UA/culture today rule out recurrent UTI   Amy Esterwood [name] 05/27/2024, 9:40 AM    ATTENDING ADDENDUM: Patient seen and examined. I reviewed his history with Amy Esterwood.   89 year old male with metastatic prostate cancer, on Xtandi , presenting with symptomatic iron  deficiency anemia with rectal bleeding and heme positive stool.  He states he had a colonoscopy back in 2005 and thought it was normal.  He has a history of radiation to the prostate for treatment, radiation proctitis certainly high on the differential although we discussed full differential diagnosis.  I offered him an EGD and colonoscopy to further evaluate this.  We discussed risks and benefits of the procedure and anesthesia.  He does have CHF with an EF of 30 to 35%, higher than average risk for anesthesia.  That being  said, understanding the risks, he agrees to proceed to sort out what is causing his anemia and rectal bleeding and hopefully we can find an etiology and treated endoscopically if possible.  We discussed if he does have radiation proctitis we will treat with APC.  Clear liquid diet today, bowel prep this evening for procedure tomorrow.  Further recommendations pending the results.  All questions answered.  Marcey Leigh, MD [facility]

## 2024-05-27 NOTE — Progress Notes (Signed)
 HD#1 SUBJECTIVE:  Patient Summary: Ronald Kaiser is a 89 y.o. with a pertinent PMH of HTN, HLD, HFrEF, and prostate cancer, who presented with dizziness and diarrhea and admitted for concern for UGIB.   Overnight Events: n/a  Interim History: The patient was seen and examined at the bedside this morning. He was in good spirits stating that he was feeling much better today. He is still experiencing dizziness when transferring from sitting to standing, but this has been going on for 6-8 months per the patient. All questions and concerns were addressed at this time.   OBJECTIVE:  Vital Signs: Vitals:   05/27/24 1144 05/27/24 1145 05/27/24 1147 05/27/24 1150  BP:      Pulse: 88 92 95 98  Resp:      Temp:      TempSrc:      SpO2:       Supplemental O2: Room Air SpO2: 97 %  There were no vitals filed for this visit.   Intake/Output Summary (Last 24 hours) at 05/27/2024 1257 Last data filed at 05/27/2024 0700 Gross per 24 hour  Intake 462.2 ml  Output --  Net 462.2 ml   Net IO Since Admission: 462.2 mL [05/27/24 1257]  Physical Exam: Const: Awake, alert in NAD HENT: Normocephalic, atraumatic, mucus membranes moist  Eyes: Conjunctiva pale Card: RRR, No MRG, No pitting edema on LE's bilaterally  Resp: LCTAB, no increased work of breathing Abd: Soft, mildly tender in the suprapubic area Extremities: Warm, pink   Patient Lines/Drains/Airways Status     Active Line/Drains/Airways     Name Placement date Placement time Site Days   Peripheral IV 05/26/24 20 G Anterior;Distal;Right;Upper Arm 05/26/24  1410  Arm  1   Wound 05/26/24 2137 Other (Comment) Head Upper 05/26/24  2137  Head  1            Pertinent labs and imaging:      Latest Ref Rng & Units 05/27/2024    7:33 AM 05/26/2024   12:18 PM 03/12/2024    7:53 AM  CBC  WBC 4.0 - 10.5 K/uL 6.4  7.5  10.0   Hemoglobin 13.0 - 17.0 g/dL 8.3  8.8  89.5   Hematocrit 39.0 - 52.0 % 24.9  26.6  32.3   Platelets 150 -  400 K/uL 230  261  286        Latest Ref Rng & Units 05/27/2024    7:01 AM 05/26/2024   12:18 PM 03/12/2024    7:53 AM  CMP  Glucose 70 - 99 mg/dL 92  99  843   BUN 8 - 23 mg/dL 26  41  36   Creatinine 0.61 - 1.24 mg/dL 9.09  8.99  8.76   Sodium 135 - 145 mmol/L 132  131  132   Potassium 3.5 - 5.1 mmol/L 3.7  3.9  4.3   Chloride 98 - 111 mmol/L 102  98  100   CO2 22 - 32 mmol/L 25  24  21    Calcium  8.9 - 10.3 mg/dL 8.5  8.7  9.3   Total Protein 6.5 - 8.1 g/dL  5.7    Total Bilirubin 0.0 - 1.2 mg/dL  0.6    Alkaline Phos 38 - 126 U/L  67    AST 15 - 41 U/L  20    ALT 0 - 44 U/L  12      CT Head Wo Contrast Result Date: 05/26/2024 CLINICAL DATA:  Blunt trauma  to head and neck.  Pain. EXAM: CT HEAD WITHOUT CONTRAST CT CERVICAL SPINE WITHOUT CONTRAST TECHNIQUE: Multidetector CT imaging of the head and cervical spine was performed following the standard protocol without intravenous contrast. Multiplanar CT image reconstructions of the cervical spine were also generated. RADIATION DOSE REDUCTION: This exam was performed according to the departmental dose-optimization program which includes automated exposure control, adjustment of the mA and/or kV according to patient size and/or use of iterative reconstruction technique. COMPARISON:  None Available. FINDINGS: CT HEAD FINDINGS Brain: No evidence of intracranial hemorrhage, acute infarction, hydrocephalus, extra-axial collection, or mass lesion/mass effect. Mild-to-moderate diffuse cerebral and cerebellar atrophy noted. Vascular:  No hyperdense vessel or other acute findings. Skull: No evidence of fracture or other significant bone abnormality. Sinuses/Orbits:  No acute findings. Other: None. CT CERVICAL SPINE FINDINGS Alignment: Normal. Skull base and vertebrae: No acute fracture. No primary bone lesion or focal pathologic process. Soft tissues and spinal canal: No prevertebral fluid or swelling. No visible canal hematoma. Disc levels: Moderate to  severe degenerative disc disease is seen at all cervical levels, greatest at C6-7. Mild multilevel facet DJD is seen on the left. Upper chest: No acute findings. Other: Sclerotic lesions are seen involving the right lateral mass of C1, right T1 lamina, and left T2 lamina,, consistent with sclerotic bone metastases given patient's history of prostate carcinoma. IMPRESSION: No acute intracranial abnormality.  Cerebral and cerebellar atrophy. No evidence of cervical spine fracture or subluxation. Sclerotic bone metastases involving the right lateral mass of C1, right T1 lamina, and left T2 lamina. Electronically Signed   By: Norleen DELENA Kil M.D.   On: 05/26/2024 16:40   CT Cervical Spine Wo Contrast Result Date: 05/26/2024 CLINICAL DATA:  Blunt trauma to head and neck.  Pain. EXAM: CT HEAD WITHOUT CONTRAST CT CERVICAL SPINE WITHOUT CONTRAST TECHNIQUE: Multidetector CT imaging of the head and cervical spine was performed following the standard protocol without intravenous contrast. Multiplanar CT image reconstructions of the cervical spine were also generated. RADIATION DOSE REDUCTION: This exam was performed according to the departmental dose-optimization program which includes automated exposure control, adjustment of the mA and/or kV according to patient size and/or use of iterative reconstruction technique. COMPARISON:  None Available. FINDINGS: CT HEAD FINDINGS Brain: No evidence of intracranial hemorrhage, acute infarction, hydrocephalus, extra-axial collection, or mass lesion/mass effect. Mild-to-moderate diffuse cerebral and cerebellar atrophy noted. Vascular:  No hyperdense vessel or other acute findings. Skull: No evidence of fracture or other significant bone abnormality. Sinuses/Orbits:  No acute findings. Other: None. CT CERVICAL SPINE FINDINGS Alignment: Normal. Skull base and vertebrae: No acute fracture. No primary bone lesion or focal pathologic process. Soft tissues and spinal canal: No prevertebral  fluid or swelling. No visible canal hematoma. Disc levels: Moderate to severe degenerative disc disease is seen at all cervical levels, greatest at C6-7. Mild multilevel facet DJD is seen on the left. Upper chest: No acute findings. Other: Sclerotic lesions are seen involving the right lateral mass of C1, right T1 lamina, and left T2 lamina,, consistent with sclerotic bone metastases given patient's history of prostate carcinoma. IMPRESSION: No acute intracranial abnormality.  Cerebral and cerebellar atrophy. No evidence of cervical spine fracture or subluxation. Sclerotic bone metastases involving the right lateral mass of C1, right T1 lamina, and left T2 lamina. Electronically Signed   By: Norleen DELENA Kil M.D.   On: 05/26/2024 16:40   CT ABDOMEN PELVIS W CONTRAST Result Date: 05/26/2024 EXAM: CT ABDOMEN AND PELVIS WITH CONTRAST 05/26/2024 02:56:21 PM TECHNIQUE:  CT of the abdomen and pelvis was performed with the administration of intravenous contrast (75mL iohexol  (OMNIPAQUE ) 350 MG/ML injection). Multiplanar reformatted images are provided for review. Automated exposure control, iterative reconstruction, and/or weight-based adjustment of the mA/kV was utilized to reduce the radiation dose to as low as reasonably achievable. COMPARISON: PET/CT 05/27/2023 CLINICAL HISTORY: Abdominal pain, acute, nonlocalized. FINDINGS: LOWER CHEST: Dependent atelectasis in the visualized lung bases. LIVER: Normal size and contour. GALLBLADDER AND BILE DUCTS: No wall thickening. No cholelithiasis. No biliary ductal dilatation. SPLEEN: Normal size. No focal lesion. PANCREAS: No mass. No ductal dilatation. ADRENAL GLANDS: Normal appearance. No mass. KIDNEYS, URETERS AND BLADDER: No stones in the kidneys or ureters. No hydronephrosis. No perinephric or periureteral stranding. Urinary bladder is partially distended with diffuse wall thickening, containing gas bubbles and coarse calcifications in dependent aspect of the lumen . GI AND  BOWEL: Stomach demonstrates no acute abnormality. There is no bowel obstruction. No bowel wall thickening. PERITONEUM AND RETROPERITONEUM: No ascites. No free air. VASCULATURE: 3.1 cm fusiform infrarenal abdominal aortic aneurysm without evidence of leak or impending rupture, terminating above the bifurcation. Coronary and aortic calcified plaque. LYMPH NODES: No lymphadenopathy. REPRODUCTIVE ORGANS: No significant abnormality. BONES AND SOFT TISSUES: Chronic severe T12 vertebral compression deformity. Multilevel spondylotic changes in the lumbar spine with mild levoscoliosis. Sclerotic lesion in the L1 vertebral body as seen previously. IMPRESSION: 1. 3.1 cm fusiform infrarenal abdominal aortic aneurysm. According to the ACR 2013 and SVS 2018 guidelines, the finding is consistent with a small abdominal aortic aneurysm (3.03.4 cm), and the recommendation is surveillance with imaging every 3 years. 2. Diffuse urinary bladder wall thickening with gas bubbles and coarse calcifications in its dependent aspect. Electronically signed by: Katheleen Faes MD 05/26/2024 03:56 PM EDT RP Workstation: [id number]    ASSESSMENT/PLAN:  Assessment: Principal Problem:   Symptomatic anemia Active Problems:   Mixed hyperlipidemia   Hypertension   CHF (congestive heart failure) (HCC)   Malignant neoplasm of prostate metastatic to bone [facility])   Plan: #Concern for GI Bleed #Chronic Diarrhea #Anemia -Pt with anemia on admission (Hgb 8.8) and hx of melena with FOBT positive, however has been on iron  supplementation outpatient which could explain dark stools and positive fobt.  -GI consulted and following with planned EGD and colonoscopy tomorrow morning.  -Will monitor hydration status closely due to bowel prep and hx of diarrhea -NPO at midnight -Will obtain hemolysis labs and iron  studies for further anemia work up  #Hypotension #Orthostatic hypotension -Pt was orthostatic overnight, suspect this was due to  dehydration 2.2 diarrhea.  -Pt maintained good PO intake this morning, however repeat orthostatics were positive with drop from 112/57-->86/61 while sitting.  -Will administer 1L bolus and re-evaluate.  -If persistent, consider repeat Echo  #HFrEF -Last echo on 5/22 with EF 30-35% with mitral regurgitation -Currently without exacerbation -With persistent orthostatic hypotension and syncopal episodes at home, will consider repeating echo.   #Hyponatremia -Patient remains mildly hyponatremia at 132 -Suspect this is due to chronic diarrhea and volume loss -1L LR bolus -Continue to monitor with daily BMPs  Best Practice: Diet: Cardiac diet IVF: Fluids: LR, Rate: 500 cc/hr x 2 hrs VTE: SCDs Start: 05/26/24 1805 Code: Full  Disposition planning: Therapy Recs: Pending, DME: walker Family Contact: Della Side, to be notified. DISPO: Anticipated discharge pending to Home pending clinical improvement.  Signature:  Schuyler Novak, DO [facility]  12:57 PM, 05/27/2024  On Call pager [phone]

## 2024-05-27 NOTE — Consult Note (Addendum)
 Consultation  Referring Provider: Medicine service/[facility] Primary Care Physician:  Tanda Prentice DEL, MD Primary Gastroenterologist: [facility]  Reason for Consultation: Anemia, iron  deficiency, intermittent rectal bleeding, dark stool, multiple comorbidities  HPI: Ronald Kaiser is a 89 y.o. male with multiple serious comorbidities including coronary artery disease status post several previous MIs, stents, and congestive heart failure with most recent EF 30 to 35% earlier this year.  Also with metastatic prostate cancer, on Xtandi , hypertension, hyperlipidemia and chronic urinary incontinence. Patient presented to the ER yesterday with multiple complaints, he has had progressive fatigue and generalized weakness over the past couple of months.  Apparently at some point last week he had a syncopal or presyncopal episode while he was standing in his bathroom, and hit the back of his head.  He was not aware of any dizziness prior to that episode, no chest pain or shortness of breath recently.  Appetite overall has been poor over the past several months and he has lost weight but is uncertain of the amount.  He says his taste has been off and everything tastes bad for the most part.  He has noticed a pulling type of pain in his abdomen over the past few months which seems to be worse with ambulation.  He has no dysphagia or odynophagia, no nausea or vomiting.  His bowel movements sounds as if they have been alternating over the past many months.  He says he will go from not having any bowel movements to having good bowel movements to having loose or diarrheal stools and this seems to go in spells.  He thinks the Xtandi  contributes to his diarrhea.  He has not had any persistent daily diarrhea over the past couple of weeks.  He has had history of recurrent UTIs and did have a UTI a little over a month ago and took an antibiotic for that.  He says that he had radiation for prostate cancer and ever  since that time has noted blood intermittently with his bowel movements over the past year and a half. CT of the head on admission negative CT cervical spine shows sclerotic lesions in C1, right T1 and left T2 consistent with bone metastases CT of the abdomen and pelvis showed a distended bladder with diffuse wall thickening and gas bubbles as well as coarse calcifications in the dependent portions.  There is a 3.1 cm infrarenal abdominal aortic aneurysm. Review of his labs shows that he had a hemoglobin of 9.3 on 03/19/2024 Yesterday WBC 7.5/hemoglobin 8.8/hematocrit 26.6/MCV 96.2 Outpatient iron  studies 03/19/2024 with serum iron  of 74 ferritin 18  Yesterday BNP 345 Sodium 131/BUN 41/creatinine 1.0/albumin 2.2 LFTs within normal limits  He had been seen by cardiology very recently for consideration of ICD and was not felt to be a candidate due to his advanced age.  He has had a change in his metoprolol  dosing which was decreased, currently taking a half a tablet twice daily.  Patient says he has been having difficulty in the early mornings with weakness and lightheadedness when he first gets up.  He says he feels a bit woozy and will feel that way for an hour or so, once he is able to eat breakfast, sit down for a while then he seems to feel better for the rest of the day.  He has been using his wife's rolling walker at home due to the sensation of wooziness.  Stool was documented to be dark and with streaks of blood per ER  provider yesterday  View of care everywhere shows that he was seen by [facility] on 04/30/2024 in referral for rectal bleeding.  That note states that he had lost about 40 pounds over the past year.  He had also been noted to be iron  deficient.  He was instructed to resume oral iron  supplement on a daily basis, he had Metamucil added for his irregular bowel habits and was to be scheduled for EGD and colonoscopy.  Patient says he has not scheduled that as he was not  sure about transportation to [place].   Past Medical History:  Diagnosis Date   AKI (acute kidney injury) (HCC) 11/12/2018   CAD (coronary artery disease)    CHF (congestive heart failure) (HCC)    Dyspnea    Foley catheter in place    HTN (hypertension)    Hyperlipidemia    Myocardial infarction (HCC) 2000   REPORTS HE HAS HAD 5 HEART ATTACKS BUT SOME OF THE WERE NOT SERIOUS    Obesity    Prostate cancer [facility])     Past Surgical History:  Procedure Laterality Date   CARDIAC CATHETERIZATION N/A 03/09/2016   Procedure: Left Heart Cath and Coronary Angiography;  Surgeon: Victory LELON Sharps, MD;  Location: [facility] INVASIVE CV LAB;  Service: Cardiovascular;  Laterality: N/A;   CYSTOSCOPY W/ RETROGRADES Bilateral 08/19/2020   Procedure: BILATERAL RETROGRADE PYELOGRAM;  Surgeon: Cam Morene LELON, MD;  Location: [facility] OR[facility];  Service: Urology;  Laterality: Bilateral;   RADIATION TO PROSTATE     Stenting of LAD  2000   TONSILLECTOMY     TRANSURETHRAL RESECTION OF PROSTATE N/A 03/01/2019   Procedure: TRANSURETHRAL RESECTION OF THE PROSTATE (TURP);  Surgeon: Cam Morene LELON, MD;  Location: [facility];  Service: Urology;  Laterality: N/A;  75 MINS   TRANSURETHRAL RESECTION OF PROSTATE N/A 08/19/2020   Procedure: TRANSURETHRAL RESECTION OF THE PROSTATE (TURP);  Surgeon: Cam Morene LELON, MD;  Location: [facility];  Service: Urology;  Laterality: N/A;    Prior to Admission medications   Medication Sig Start Date End Date Taking? Authorizing Provider  aspirin  EC 81 MG tablet Take 81 mg by mouth daily. Swallow whole.   Yes [provider]  CALCIUM  PO Take 1 tablet by mouth daily.   Yes [provider]  enzalutamide  (XTANDI ) 40 MG capsule Take 160 mg by mouth daily.   Yes [provider]  ferrous sulfate  325 (65 FE) MG EC tablet Take 325 mg by mouth daily with breakfast.   Yes [provider]  furosemide  (LASIX ) 20 MG tablet Take 20 mg by mouth as needed for fluid or  edema.   Yes [provider]  lisinopril  (ZESTRIL ) 5 MG tablet Take 0.5 tablets (2.5 mg total) by mouth daily. 11/28/23  Yes Emelia Josefa HERO, NP  metoprolol  tartrate (LOPRESSOR ) 25 MG tablet Take 0.5 tablets (12.5 mg total) by mouth 2 (two) times daily. 11/28/23  Yes Cleaver, Josefa HERO, NP  Multiple Vitamins-Minerals (CENTRUM SILVER ULTRA MENS PO) Take 1 tablet by mouth daily.    Yes [provider]  nitroGLYCERIN  (NITROSTAT ) 0.4 MG SL tablet Place 1 tablet (0.4 mg total) under the tongue every 5 (five) minutes as needed. 04/17/17  Yes Pietro Redell RAMAN, MD  pravastatin  (PRAVACHOL ) 20 MG tablet Take 1 tablet (20 mg total) by mouth every evening. 08/01/23  Yes Pietro Redell RAMAN, MD    Current Facility-Administered Medications  Medication Dose Route Frequency Provider Last Rate Last Admin   0.45 % sodium  chloride infusion   Intravenous Continuous Esterwood, Amy S, [name]       0.9 %  sodium chloride  infusion   Intravenous Continuous Esterwood, Amy S, [name]       acetaminophen  (TYLENOL ) tablet 650 mg  650 mg Oral Q6H PRN Tobie Gaines, DO       Or   acetaminophen  (TYLENOL ) suppository 650 mg  650 mg Rectal Q6H PRN Tobie Gaines, DO       bisacodyl  (DULCOLAX) EC tablet 10 mg  10 mg Oral Once Esterwood, Amy S, [name]       enzalutamide  (XTANDI ) capsule 160 mg  160 mg Oral Daily Tobie Gaines, DO       pantoprazole  (PROTONIX ) injection 40 mg  40 mg Intravenous Q12H Patel, Amar, DO   40 mg at 05/26/24 2132   polyethylene glycol (MIRALAX  / GLYCOLAX ) packet 17 g  17 g Oral Daily PRN Tobie Gaines, DO       polyethylene glycol powder (GLYCOLAX /MIRALAX ) container 119 g  119 g Oral Once Esterwood, Amy S, [name]       Followed by   polyethylene glycol powder (GLYCOLAX /MIRALAX ) container 119 g  119 g Oral Once Esterwood, Amy S, [name]       pravastatin  (PRAVACHOL ) tablet 20 mg  20 mg Oral QPM Tobie Gaines, DO       simethicone  (MYLICON) chewable tablet 240 mg  240 mg Oral Once Esterwood, Amy S, [name]        Followed by   simethicone  ARLINA) chewable tablet 240 mg  240 mg Oral Once Esterwood, Amy S, [name]        Allergies as of 05/26/2024 - Review Complete 05/26/2024  Allergen Reaction Noted   Bactrim [sulfamethoxazole-trimethoprim] Hives, Shortness Of Breath, and Swelling 01/21/2019   Amoxicillin-pot clavulanate Diarrhea 01/20/2022   Crestor  [rosuvastatin  calcium ] Other (See Comments) 08/30/2022   Zetia  [ezetimibe ] Other (See Comments) 08/30/2022    Family History  Problem Relation Age of Onset   Hypertension Father    Breast cancer Neg Hx    Prostate cancer Neg Hx    Colon cancer Neg Hx    Pancreatic cancer Neg Hx     Social History   Socioeconomic History   Marital status: Married    Spouse name: Not on file   Number of children: Not on file   Years of education: Not on file   Highest education level: Not on file  Occupational History    Comment: retired  Tobacco Use   Smoking status: Former    Current packs/day: 0.00    Average packs/day: 0.5 packs/day for 7.0 years (3.5 ttl pk-yrs)    Types: Cigarettes    Start date: 10/04/1955    Quit date: 10/03/1962    Years since quitting: 61.6   Smokeless tobacco: Never  Vaping Use   Vaping status: Never Used  Substance and Sexual Activity   Alcohol use: Yes    Comment: OCC BEER AND WINE    Drug use: Not Currently   Sexual activity: Not Currently  Other Topics Concern   Not on file  Social History Narrative   Moved from [facility] to [place], [place] following divorce to be closer to his brother. Patient has remarried.   Social Drivers of Corporate investment banker Strain: Not on file  Food Insecurity: No Food Insecurity (05/26/2024)   Hunger Vital Sign    Worried About Running Out of Food in the Last Year: Never true    Ran Out of Food in  the Last Year: Never true  Transportation Needs: No Transportation Needs (05/26/2024)   PRAPARE - Administrator, Civil Service (Medical): No    Lack of Transportation (Non-Medical):  No  Physical Activity: Not on file  Stress: Not on file  Social Connections: Moderately Isolated (05/26/2024)   Social Connection and Isolation Panel    Frequency of Communication with Friends and Family: More than three times a week    Frequency of Social Gatherings with Friends and Family: More than three times a week    Attends Religious Services: Never    Database administrator or Organizations: No    Attends Banker Meetings: Never    Marital Status: Married  Catering manager Violence: Not At Risk (05/26/2024)   Humiliation, Afraid, Rape, and Kick questionnaire    Fear of Current or Ex-Partner: No    Emotionally Abused: No    Physically Abused: No    Sexually Abused: No    Review of Systems: Pertinent positive and negative review of systems were noted in the above HPI section.  All other review of systems was otherwise negative.   Physical Exam: Vital signs in last 24 hours: Temp:  [97.4 F (36.3 C)-98.3 F (36.8 C)] 98.1 F (36.7 C) (08/25 0728) Pulse Rate:  [52-100] 52 (08/25 0728) Resp:  [15-22] 16 (08/25 0728) BP: (65-105)/(43-79) 93/51 (08/25 0728) SpO2:  [95 %-100 %] 97 % (08/25 0728) Last BM Date : 05/27/24 General:   Alert,  Well-developed, thin frail appearing elderly white male pleasant and cooperative in NAD Head:  Normocephalic and atraumatic. Eyes:  Sclera clear, no icterus.   Conjunctiva pink. Ears:  Normal auditory acuity. Nose:  No deformity, discharge,  or lesions. Mouth:  No deformity or lesions.   Neck:  Supple; no masses or thyromegaly. Lungs:  Clear throughout to auscultation.   No wheezes, crackles, or rhonchi.  Heart:  Regular rate and rhythm; no murmurs, clicks, rubs,  or gallops. Abdomen:  Soft, no focal abdominal tenderness, BS active,nonpalp mass or hsm.  No audible bruit Rectal: Not done, noted to have dark stool with streaks of heme per ER yesterday Msk:  Symmetrical without gross deformities. . Pulses:  Normal pulses  noted. Extremities:  Without clubbing or edema. Neurologic:  Alert and  oriented x4;  grossly normal neurologically. Skin:  Intact without significant lesions or rashes.. Psych:  Alert and cooperative. Normal mood and affect.  Intake/Output from previous day: 08/24 0701 - 08/25 0700 In: 462.2 [I.V.:462.2] Out: -  Intake/Output this shift: No intake/output data recorded.  Lab Results: Recent Labs    05/26/24 1218 05/27/24 0733  WBC 7.5 6.4  HGB 8.8* 8.3*  HCT 26.6* 24.9*  PLT 261 230   BMET Recent Labs    05/26/24 1218 05/27/24 0701  NA 131* 132*  K 3.9 3.7  CL 98 102  CO2 24 25  GLUCOSE 99 92  BUN 41* 26*  CREATININE 1.00 0.90  CALCIUM  8.7* 8.5*   LFT Recent Labs    05/26/24 1218  PROT 5.7*  ALBUMIN 2.2*  AST 20  ALT 12  ALKPHOS 67  BILITOT 0.6   PT/INR No results for input(s): LABPROT, INR in the last 72 hours. Hepatitis Panel No results for input(s): HEPBSAG, HCVAB, HEPAIGM, HEPBIGM in the last 72 hours.     IMPRESSION:  #11 89 year old white male admitted with progressive fatigue and weakness, this is in the setting of poor appetite over the past several months, gradual weight  loss, and a couple of falls.  He did have an episode of syncope or presyncope last week while standing in the bathroom.  I do not think that his presyncopal episode secondary to GI bleeding as he has had a slow drift in his hemoglobin.  Patient has multiple other comorbidities which need to be considered regarding episode of syncope.  Also has progressive metastatic prostate cancer likely contributing to his overall decline in status over the past several months.  Query if Xtandi  may be causing dizziness.  #2 anemia, iron  deficiency, heme positive stool and patient report of intermittent rectal bleeding over the past year or so.  This is in the setting of known prostate cancer status post previous radiation. Rule out radiation proctitis as source for chronic GI  blood loss, will also need to rule out occult neoplasm (not seen on CT), AVMs, chronic gastropathy.  #3 metastatic prostate cancer-now has evidence of spinal mets on cervical spine CT-on Xtandi   #4 coronary artery disease status post several prior MIs, stents, no current anticoagulation.  #5 congestive heart failure with EF 30 to 35%  #6 alternating bowel habits with episodes of diarrhea, normal bowel movements and no bowel movements. Doubt infectious, spec medications contributing i.e. Xtandi  which has side effect of diarrhea  #7 hypertension #8 hypoalbuminemia  PLAN: Full liquid breakfast, then clear liquid diet Discussion with the patient today, he would like to have any GI workup needed to be done while he is here and I think that is appropriate given his advanced age, frailty etc. Will schedule for EGD and colonoscopy with Dr. Leigh for tomorrow 05/28/2024.  Both procedures were discussed in detail with the patient including indications risks and benefits and he is agreeable to proceed.  Trend hemoglobin, transfuse if indicated Okay for oral iron  supplementation for now 325 twice daily  Check UA/culture today rule out recurrent UTI   Amy Esterwood [name] 05/27/2024, 9:40 AM    ATTENDING ADDENDUM: Patient seen and examined. I reviewed his history with Amy Esterwood.   89 year old male with metastatic prostate cancer, on Xtandi , presenting with symptomatic iron  deficiency anemia with rectal bleeding and heme positive stool.  He states he had a colonoscopy back in 2005 and thought it was normal.  He has a history of radiation to the prostate for treatment, radiation proctitis certainly high on the differential although we discussed full differential diagnosis.  I offered him an EGD and colonoscopy to further evaluate this.  We discussed risks and benefits of the procedure and anesthesia.  He does have CHF with an EF of 30 to 35%, higher than average risk for anesthesia.  That being  said, understanding the risks, he agrees to proceed to sort out what is causing his anemia and rectal bleeding and hopefully we can find an etiology and treated endoscopically if possible.  We discussed if he does have radiation proctitis we will treat with APC.  Clear liquid diet today, bowel prep this evening for procedure tomorrow.  Further recommendations pending the results.  All questions answered.  Marcey Leigh, MD [facility]

## 2024-05-28 ENCOUNTER — Inpatient Hospital Stay (HOSPITAL_COMMUNITY): Admitting: Anesthesiology

## 2024-05-28 ENCOUNTER — Other Ambulatory Visit: Payer: Self-pay | Admitting: *Deleted

## 2024-05-28 ENCOUNTER — Encounter (HOSPITAL_COMMUNITY): Payer: Self-pay

## 2024-05-28 ENCOUNTER — Encounter (HOSPITAL_COMMUNITY): Admission: EM | Disposition: A | Discharge status: Home Health | Payer: Self-pay | Source: Home / Self Care

## 2024-05-28 DIAGNOSIS — K3189 Other diseases of stomach and duodenum: Secondary | ICD-10-CM

## 2024-05-28 DIAGNOSIS — K5521 Angiodysplasia of colon with hemorrhage: Secondary | ICD-10-CM | POA: Diagnosis not present

## 2024-05-28 DIAGNOSIS — K5710 Diverticulosis of small intestine without perforation or abscess without bleeding: Secondary | ICD-10-CM | POA: Diagnosis not present

## 2024-05-28 DIAGNOSIS — K56699 Other intestinal obstruction unspecified as to partial versus complete obstruction: Secondary | ICD-10-CM

## 2024-05-28 DIAGNOSIS — I2510 Atherosclerotic heart disease of native coronary artery without angina pectoris: Secondary | ICD-10-CM | POA: Diagnosis not present

## 2024-05-28 DIAGNOSIS — K648 Other hemorrhoids: Secondary | ICD-10-CM

## 2024-05-28 DIAGNOSIS — K5730 Diverticulosis of large intestine without perforation or abscess without bleeding: Secondary | ICD-10-CM

## 2024-05-28 DIAGNOSIS — Y842 Radiological procedure and radiotherapy as the cause of abnormal reaction of the patient, or of later complication, without mention of misadventure at the time of the procedure: Secondary | ICD-10-CM

## 2024-05-28 DIAGNOSIS — K627 Radiation proctitis: Principal | ICD-10-CM

## 2024-05-28 DIAGNOSIS — K319 Disease of stomach and duodenum, unspecified: Secondary | ICD-10-CM

## 2024-05-28 HISTORY — PX: COLONOSCOPY: SHX5424

## 2024-05-28 HISTORY — PX: ESOPHAGOGASTRODUODENOSCOPY: SHX5428

## 2024-05-28 LAB — CBC
HCT: 27.6 % — ABNORMAL LOW (ref 39.0–52.0)
Hemoglobin: 9.1 g/dL — ABNORMAL LOW (ref 13.0–17.0)
MCH: 31.5 pg (ref 26.0–34.0)
MCHC: 33 g/dL (ref 30.0–36.0)
MCV: 95.5 fL (ref 80.0–100.0)
Platelets: 264 K/uL (ref 150–400)
RBC: 2.89 MIL/uL — ABNORMAL LOW (ref 4.22–5.81)
RDW: 15.3 % (ref 11.5–15.5)
WBC: 7.5 K/uL (ref 4.0–10.5)
nRBC: 0 % (ref 0.0–0.2)

## 2024-05-28 LAB — BASIC METABOLIC PANEL WITH GFR
Anion gap: 13 (ref 5–15)
BUN: 17 mg/dL (ref 8–23)
CO2: 19 mmol/L — ABNORMAL LOW (ref 22–32)
Calcium: 8.3 mg/dL — ABNORMAL LOW (ref 8.9–10.3)
Chloride: 98 mmol/L (ref 98–111)
Creatinine, Ser: 0.91 mg/dL (ref 0.61–1.24)
GFR, Estimated: >60 mL/min (ref >60)
Glucose, Bld: 108 mg/dL — ABNORMAL HIGH (ref 70–99)
Potassium: 4.7 mmol/L (ref 3.5–5.1)
Sodium: 130 mmol/L — ABNORMAL LOW (ref 135–145)

## 2024-05-28 LAB — HAPTOGLOBIN: Haptoglobin: 276 mg/dL (ref 38–329)

## 2024-05-28 LAB — TSH: TSH: 4.635 u[IU]/mL — ABNORMAL HIGH (ref 0.350–4.500)

## 2024-05-28 SURGERY — COLONOSCOPY
Anesthesia: Monitor Anesthesia Care

## 2024-05-28 MED ORDER — IRON SUCROSE 500 MG IVPB - SIMPLE MED
500.0000 mg | Freq: Once | INTRAVENOUS | Status: DC
  Filled 2024-05-28: qty 275

## 2024-05-28 MED ORDER — AMBULATORY NON FORMULARY MEDICATION: 0 refills | Status: DC

## 2024-05-28 MED ORDER — PHENYLEPHRINE HCL (PRESSORS) 10 MG/ML IV SOLN
INTRAVENOUS | Status: DC | PRN
  Administered 2024-05-28 (×3): 80 ug via INTRAVENOUS

## 2024-05-28 MED ORDER — PHENYLEPHRINE HCL-NACL 20-0.9 MG/250ML-% IV SOLN
INTRAVENOUS | Status: DC | PRN
  Administered 2024-05-28: 50 ug/min via INTRAVENOUS

## 2024-05-28 MED ORDER — PROPOFOL 500 MG/50ML IV EMUL
INTRAVENOUS | Status: DC | PRN
  Administered 2024-05-28: 75 ug/kg/min via INTRAVENOUS

## 2024-05-28 MED ORDER — LIDOCAINE HCL (PF) 2 % IJ SOLN
INTRAMUSCULAR | Status: DC | PRN
  Administered 2024-05-28: 60 mg via INTRADERMAL

## 2024-05-28 MED ORDER — SODIUM CHLORIDE 0.9 % IV SOLN
500.0000 mg | Freq: Once | INTRAVENOUS | Status: AC
  Administered 2024-05-28: 500 mg via INTRAVENOUS
  Filled 2024-05-28: qty 25

## 2024-05-28 MED ORDER — EPHEDRINE SULFATE (PRESSORS) 50 MG/ML IJ SOLN
INTRAMUSCULAR | Status: DC | PRN
  Administered 2024-05-28: 5 mg via INTRAVENOUS

## 2024-05-28 MED ORDER — SODIUM CHLORIDE 0.9 % IV SOLN: INTRAVENOUS | Status: DC | PRN

## 2024-05-28 NOTE — Transfer of Care (Signed)
 Immediate Anesthesia Transfer of Care Note  Patient: Ronald Kaiser  Procedure(s) Performed: COLONOSCOPY EGD (ESOPHAGOGASTRODUODENOSCOPY)  Patient Location: Endoscopy Unit  Anesthesia Type:MAC  Level of Consciousness: drowsy and patient cooperative  Airway & Oxygen Therapy: Patient Spontanous Breathing and Patient connected to face mask oxygen  Post-op Assessment: Report given to RN, Post -op Vital signs reviewed and stable, Patient moving all extremities, and Patient moving all extremities X 4  Post vital signs: Reviewed and stable  Last Vitals:  Vitals Value Taken Time  BP 101/41 05/28/24 09:06  Temp 36.1 C 05/28/24 09:06  Pulse 86 05/28/24 09:09  Resp 17 05/28/24 09:09  SpO2 93 % 05/28/24 09:09  Vitals shown include unfiled device data.  Last Pain:  Vitals:   05/28/24 0906  TempSrc: Temporal  PainSc: 0-No pain         Complications: No notable events documented.

## 2024-05-28 NOTE — Plan of Care (Signed)
 Miralax  bowel prep completed by [id number]. Consent document signed.  Report given to [facility] in Endo.  Problem: Education: Goal: Knowledge of General Education information will improve Description: Including pain rating scale, medication(s)/side effects and non-pharmacologic comfort measures Outcome: Progressing   Problem: Health Behavior/Discharge Planning: Goal: Ability to manage health-related needs will improve Outcome: Progressing   Problem: Clinical Measurements: Goal: Ability to maintain clinical measurements within normal limits will improve Outcome: Progressing Goal: Will remain free from infection Outcome: Progressing Goal: Diagnostic test results will improve Outcome: Progressing Goal: Respiratory complications will improve Outcome: Progressing Goal: Cardiovascular complication will be avoided Outcome: Progressing   Problem: Activity: Goal: Risk for activity intolerance will decrease Outcome: Progressing   Problem: Nutrition: Goal: Adequate nutrition will be maintained Outcome: Progressing   Problem: Coping: Goal: Level of anxiety will decrease Outcome: Progressing   Problem: Elimination: Goal: Will not experience complications related to bowel motility Outcome: Progressing Goal: Will not experience complications related to urinary retention Outcome: Progressing   Problem: Pain Managment: Goal: General experience of comfort will improve and/or be controlled Outcome: Progressing   Problem: Safety: Goal: Ability to remain free from injury will improve Outcome: Progressing   Problem: Skin Integrity: Goal: Risk for impaired skin integrity will decrease Outcome: Progressing

## 2024-05-28 NOTE — Op Note (Addendum)
 [facility] Patient Name: Ronald Kaiser Procedure Date : 05/28/2024 MRN: [id number] Attending MD: Elspeth SQUIBB. Leigh , MD, [id number] Date of Birth: [birth year] CSN: [id number] Age: 89 Admit Type: Inpatient Procedure:                Colonoscopy Indications:              Rectal bleeding, Iron  deficiency anemia - history                            of metastatic prostate cancer, s/p radiation to the                            prostate Providers:                Elspeth P. Leigh, MD, Fairy Marina,                            Technician, Jacquelyn Jaci Pierce, RN Referring MD:              Medicines:                Monitored Anesthesia Care Complications:            No immediate complications. Estimated blood loss:                            Minimal. Estimated Blood Loss:     Estimated blood loss was minimal. Procedure:                Pre-Anesthesia Assessment:                           - Prior to the procedure, a History and Physical                            was performed, and patient medications and                            allergies were reviewed. The patient's tolerance of                            previous anesthesia was also reviewed. The risks                            and benefits of the procedure and the sedation                            options and risks were discussed with the patient.                            All questions were answered, and informed consent                            was obtained. Prior Anticoagulants: The patient has                            taken  no anticoagulant or antiplatelet agents. ASA                            Grade Assessment: III - A patient with severe                            systemic disease. After reviewing the risks and                            benefits, the patient was deemed in satisfactory                            condition to undergo the procedure.                           After obtaining informed  consent, the colonoscope                            was passed under direct vision. Throughout the                            procedure, the patient's blood pressure, pulse, and                            oxygen saturations were monitored continuously. The                            [id number]-HQ190L ([id number]) Olympus colonoscope was                            introduced through the anus with the intention of                            advancing to the cecum. The scope was advanced to                            the sigmoid colon before the procedure was aborted.                            Medications were given. The colonoscopy was                            technically difficult and complex due to restricted                            mobility of the colon. The patient tolerated the                            procedure well. The quality of the bowel                            preparation was good. The rectum was photographed. Scope In: 8:34:22 AM Scope Out: 8:59:06 AM Total Procedure Duration: 0 hours 24 minutes 44 seconds  Findings:      The perianal and digital rectal examinations were normal.      Multiple small-mouthed diverticula were found in the sigmoid colon.      The colon was severely restricted in the distal sigmoid colon with a       benign stenosis appreciated. Regular pediatric colonoscope could not       traverse the area. This was withdrawn and an ultraslim pediatric       colonoscope was used but it also could not traverse the area (despite       water  immersion, etc), mostly due to significant restrictive changes       throughout the entire distal colon likely from radiation. The scope was       then withdrawn and APC applied to radiation proctitis.      Multiple angiodysplastic lesions with bleeding were found in the rectum,       consistent with severe radiation proctitis. Fulguration to ablate the       lesions to prevent bleeding by argon plasma was successful.       Internal hemorrhoids were found.      The exam was otherwise without abnormality. Retroflexed views of the       rectum not obtained due to small size of the rectum and radiation       changes. Impression:               - Diverticulosis in the sigmoid colon with                            significant restriction of the distal sigmoid colon                            - both pediatric and ultraslim pediatric                            colonoscope unable to traverse the distal sigmoid                            colon.                           - Severe radiation proctitis. Treated with argon                            plasma coagulation (APC).                           - Internal hemorrhoids.                           - The examination was otherwise normal of what was                            visualized.                           Of note total procedure time includes several  minutes of [place] while switching colonoscopes                            due to restriction. Total procedure time does not                            reflect the true amount of time of the colonoscope                            in the patient.                           Radiation proctitis is the likely cause of his                            bleeding symptoms / anemia, procedure not complete                            however due to restriction in the left colon. Recommendation:           - Return patient to hospital ward for ongoing care.                           - Advance diet as tolerated.                           - Continue present medications.                           - Recommend topical sucralfate suppositories twice                            daily upon discharge (we [facility] ([facility]), not available                            at regular pharmacies                           - Follow up with primary GI MD upon  discharge                            ([facility] - Dr. Tanda)                           - GI service will sign off for now, call with                            questions in the interim Procedure Code(s):        --- Professional ---                           [phone], 52, Colonoscopy, flexible; with control  of                            bleeding, any method Diagnosis Code(s):        --- Professional ---                           K64.8, Other hemorrhoids                           K56.699, Other intestinal obstruction unspecified                            as to partial versus complete obstruction                           K55.21, Angiodysplasia of colon with hemorrhage                           K62.5, Hemorrhage of anus and rectum                           D50.9, Iron  deficiency anemia, unspecified                           K57.30, Diverticulosis of large intestine without                            perforation or abscess without bleeding CPT copyright 2022 American [employer] Association. All rights reserved. The codes documented in this report are preliminary and upon coder review may  be revised to meet current compliance requirements. Elspeth P. Blessing Ozga, MD 05/28/2024 9:13:41 AM This report has been signed electronically. Number of Addenda: 0

## 2024-05-28 NOTE — Anesthesia Preprocedure Evaluation (Addendum)
 Anesthesia Evaluation  Patient identified by MRN, date of birth, ID band Patient awake    Reviewed: Allergy & Precautions, NPO status , Patient's Chart, lab work & pertinent test results  Airway Mallampati: III       Dental no notable dental hx.    Pulmonary former smoker   Pulmonary exam normal        Cardiovascular hypertension, Pt. on medications and Pt. on home beta blockers + CAD, + Past MI, + Cardiac Stents and +CHF  Normal cardiovascular exam     Neuro/Psych negative neurological ROS  negative psych ROS   GI/Hepatic negative GI ROS, Neg liver ROS,,,  Endo/Other  negative endocrine ROS    Renal/GU Renal disease     Musculoskeletal  (+) Arthritis ,    Abdominal   Peds  Hematology  (+) Blood dyscrasia, anemia   Anesthesia Other Findings Anemia, iron  deficiency, rectal bleeding, possible radiation proctitis, heme positive stool  Reproductive/Obstetrics                              Anesthesia Physical Anesthesia Plan  ASA: 3  Anesthesia Plan: MAC   Post-op Pain Management:    Induction:   PONV Risk Score and Plan: 1 and Treatment may vary due to age or medical condition  Airway Management Planned: Nasal Cannula  Additional Equipment:   Intra-op Plan:   Post-operative Plan:   Informed Consent: I have reviewed the patients History and Physical, chart, labs and discussed the procedure including the risks, benefits and alternatives for the proposed anesthesia with the patient or authorized representative who has indicated his/her understanding and acceptance.       Plan Discussed with: CRNA  Anesthesia Plan Comments:          Anesthesia Quick Evaluation

## 2024-05-28 NOTE — Progress Notes (Signed)
 HD#2 SUBJECTIVE:  Patient Summary: Ronald Kaiser is a 89 y.o. with a pertinent PMH of HTN, HLD, HFrEF, and prostate cancer, who presented with dizziness and diarrhea and admitted for concern for UGIB.   Overnight Events: n/a  Interim History: The patient was seen and examined at the bedside this morning after his procedures. He was in no acute distress  OBJECTIVE:  Vital Signs: Vitals:   05/28/24 0920 05/28/24 0930 05/28/24 0935 05/28/24 0959  BP: (!) 84/48 (!) 78/49 (!) 94/57 (!) 97/53  Pulse: 82 84  61  Resp: 20 20 20 20   Temp:    (!) 97.5 F (36.4 C)  TempSrc:    Oral  SpO2: 95% 94% 94% 93%  Weight:       Supplemental O2: Room Air SpO2: 93 %  Filed Weights   05/28/24 0809  Weight: 65.3 kg     Intake/Output Summary (Last 24 hours) at 05/28/2024 1429 Last data filed at 05/28/2024 1100 Gross per 24 hour  Intake 2240 ml  Output --  Net 2240 ml   Net IO Since Admission: 2,702.2 mL [05/28/24 1429]  Physical Exam: Const: Awake, alert in NAD HENT: Normocephalic, atraumatic, mucus membranes moist  Eyes: Conjunctiva pale Card: RRR, No MRG, No pitting edema on LE's bilaterally  Resp: LCTAB, no increased work of breathing Abd: Soft, mildly tender in the suprapubic area Extremities: Warm, pink   Patient Lines/Drains/Airways Status     Active Line/Drains/Airways     Name Placement date Placement time Site Days   Peripheral IV 05/26/24 20 G Anterior;Distal;Right;Upper Arm 05/26/24  1410  Arm  1   Wound 05/26/24 2137 Other (Comment) Head Upper 05/26/24  2137  Head  1            Pertinent labs and imaging:      Latest Ref Rng & Units 05/28/2024    3:18 AM 05/27/2024    7:33 AM 05/26/2024   12:18 PM  CBC  WBC 4.0 - 10.5 K/uL 7.5  6.4  7.5   Hemoglobin 13.0 - 17.0 g/dL 9.1  8.3  8.8   Hematocrit 39.0 - 52.0 % 27.6  24.9  26.6   Platelets 150 - 400 K/uL 264  230  261        Latest Ref Rng & Units 05/28/2024    3:18 AM 05/27/2024    7:01 AM 05/26/2024    12:18 PM  CMP  Glucose 70 - 99 mg/dL 891  92  99   BUN 8 - 23 mg/dL 17  26  41   Creatinine 0.61 - 1.24 mg/dL 9.08  9.09  8.99   Sodium 135 - 145 mmol/L 130  132  131   Potassium 3.5 - 5.1 mmol/L 4.7  3.7  3.9   Chloride 98 - 111 mmol/L 98  102  98   CO2 22 - 32 mmol/L 19  25  24    Calcium  8.9 - 10.3 mg/dL 8.3  8.5  8.7   Total Protein 6.5 - 8.1 g/dL   5.7   Total Bilirubin 0.0 - 1.2 mg/dL   0.6   Alkaline Phos 38 - 126 U/L   67   AST 15 - 41 U/L   20   ALT 0 - 44 U/L   12     No results found.   ASSESSMENT/PLAN:  Assessment: Principal Problem:   Symptomatic anemia Active Problems:   Mixed hyperlipidemia   Hypertension   CHF (congestive heart failure) (HCC)  Malignant neoplasm of prostate metastatic to bone [facility])   Rectal bleeding   Iron  deficiency anemia   Radiation proctitis   Gastric erythema   Plan: #Concern for GI Bleed #Chronic Diarrhea #Anemia -Pt with anemia on admission (Hgb 8.8) and hx of melena with FOBT positive, however has been on iron  supplementation outpatient which could explain dark stools and positive fobt.  -GI consulted and performed EGD and Colonoscopy today with findings of severe radiation proctitis treated with APC and significant radiation changes in the lower colon which prohibited full colonoscopy. EGD negative.  -Per GI recommendations, sucralfate suppositories twice daily for 2 weeks.  -Iron  studies showed inefficient iron  saturation ratio--will replete with 500mg  IV venofer   #Hypotension #Orthostatic hypotension -Pt was orthostatic yesterday, suspect this was due to dehydration 2/2 diarrhea.  -Will recheck orthostatics today after his procedures.  -With pt describing syncopal episode at home, suspect he is having episodes of hypotension at home.  -Continue to hold lisinopril  and metoprolol  due to soft pressures and will plan to hold on discharge as well.   #HFrEF -Last echo on 5/22 with EF 30-35% with mitral regurgitation -Currently  without exacerbation -With persistent orthostatic hypotension and syncopal episodes at home, will consider repeating echo.   #Hyponatremia -Patient remains mildly hyponatremia at 130 -Suspect this is due to frequent diarrhea and bowel prep yesterday. Will encourage PO intake today -Continue to monitor with daily BMPs  Best Practice: Diet: Cardiac diet IVF: Fluids: LR, Rate: 500 cc/hr x 2 hrs VTE: SCDs Start: 05/26/24 1805 Code: Full  Disposition planning: Therapy Recs: Pending, DME: walker Family Contact: Della Side, to be notified. DISPO: Anticipated discharge pending to Home pending clinical improvement.  Signature:  Schuyler Novak, DO [facility]  2:29 PM, 05/28/2024  On Call pager [phone]

## 2024-05-28 NOTE — Progress Notes (Signed)
 Foam dressing placed on sacrum and mid back for skin protection.  Patient adamant on wearing depends, buttocks with mild redness, barrier cream applied.

## 2024-05-28 NOTE — Progress Notes (Signed)
 Per Dr Leigh, he would like patient to have sucralfate suppositories for radiation proctitis.   sucralfate 50 mg suppositories BID x 14 days.  Rx has been called in to [facility] (spoke with Diane).

## 2024-05-28 NOTE — Op Note (Signed)
 [facility] Patient Name: Ronald Kaiser Procedure Date : 05/28/2024 MRN: [id number] Attending MD: Elspeth SQUIBB. Leigh , MD, [id number] Date of Birth: Oct 12, 1933 CSN: [id number] Age: 89 Admit Type: Inpatient Procedure:                Upper GI endoscopy Indications:              Iron  deficiency anemia, rectal bleeding Providers:                Elspeth P. Leigh, MD, Fairy Marina,                            Technician, Jacquelyn Jaci Pierce, RN Referring MD:              Medicines:                Monitored Anesthesia Care Complications:            No immediate complications. Estimated blood loss:                            Minimal. Estimated Blood Loss:     Estimated blood loss was minimal. Procedure:                Pre-Anesthesia Assessment:                           - Prior to the procedure, a History and Physical                            was performed, and patient medications and                            allergies were reviewed. The patient's tolerance of                            previous anesthesia was also reviewed. The risks                            and benefits of the procedure and the sedation                            options and risks were discussed with the patient.                            All questions were answered, and informed consent                            was obtained. Prior Anticoagulants: The patient has                            taken no anticoagulant or antiplatelet agents. ASA                            Grade Assessment: III - A patient with severe  systemic disease. After reviewing the risks and                            benefits, the patient was deemed in satisfactory                            condition to undergo the procedure.                           After obtaining informed consent, the endoscope was                            passed under direct vision. Throughout the                             procedure, the patient's blood pressure, pulse, and                            oxygen saturations were monitored continuously. The                            [id number]-[id number] ([id number]) Olympus endoscope was introduced                            through the mouth, and advanced to the second part                            of duodenum. The upper GI endoscopy was                            accomplished without difficulty. The patient                            tolerated the procedure well. Scope In: Scope Out: Findings:      Esophagogastric landmarks were identified: the Z-line was found at 40       cm, the gastroesophageal junction was found at 40 cm and the upper       extent of the gastric folds was found at 40 cm from the incisors.      The exam of the esophagus was otherwise normal.      Patchy mildly erythematous mucosa was found in the gastric body.      The exam of the stomach was otherwise normal.      Biopsies were taken with a cold forceps for Helicobacter pylori testing.      A large diverticulum was found in the second portion of the duodenum.      The exam of the duodenum was otherwise normal. Impression:               - Esophagogastric landmarks identified.                           - Normal esophagus otherwise.                           - Erythematous mucosa in the gastric body. Biopsies  taken to rule out H pylori                           - Normal stomach otherwise.                           - Duodenal diverticulum.                           - Normal duodenum otherwise.                           No overt cause for IDA on EGD, biopsies taken to                            rule out H pylori. Recommendation:           - Proceed with colonoscopy - full recommendations                            in colonoscopy report Procedure Code(s):        --- Professional ---                           [phone], Esophagogastroduodenoscopy, flexible,                             transoral; with biopsy, single or multiple Diagnosis Code(s):        --- Professional ---                           K31.89, Other diseases of stomach and duodenum                           D50.9, Iron  deficiency anemia, unspecified                           K57.10, Diverticulosis of small intestine without                            perforation or abscess without bleeding CPT copyright 2022 American Medical Association. All rights reserved. The codes documented in this report are preliminary and upon coder review may  be revised to meet current compliance requirements. Elspeth P. Jayleigh Notarianni, MD 05/28/2024 9:15:01 AM This report has been signed electronically. Number of Addenda: 0

## 2024-05-28 NOTE — Plan of Care (Signed)

## 2024-05-28 NOTE — Anesthesia Postprocedure Evaluation (Signed)
 Anesthesia Post Note  Patient: Ronald Kaiser  Procedure(s) Performed: COLONOSCOPY EGD (ESOPHAGOGASTRODUODENOSCOPY)     Patient location during evaluation: Endoscopy Anesthesia Type: MAC Level of consciousness: awake Pain management: pain level controlled Vital Signs Assessment: post-procedure vital signs reviewed and stable Respiratory status: spontaneous breathing, nonlabored ventilation and respiratory function stable Cardiovascular status: blood pressure returned to baseline and stable Postop Assessment: no apparent nausea or vomiting Anesthetic complications: no   No notable events documented.  Last Vitals:  Vitals:   05/28/24 0935 05/28/24 0959  BP: (!) 94/57 (!) 97/53  Pulse:  61  Resp: 20 20  Temp:  (!) 36.4 C  SpO2: 94% 93%    Last Pain:  Vitals:   05/28/24 0959  TempSrc: Oral  PainSc:                  Bernardino SQUIBB Ame Heagle

## 2024-05-28 NOTE — Interval H&P Note (Signed)
 History and Physical Interval Note: PAtient here for EGD and colonoscopy today. No interval changes overnight. States prep went well. I have discussed risks / benefits of the exam and anesthesia with him and he wishes to proceed. Further recommendations pending the results. He agrees. All questions answered.   05/28/2024 8:11 AM  Ronald Kaiser  has presented today for surgery, with the diagnosis of Anemia, iron  deficiency, rectal bleeding, possible radiation proctitis, heme positive stool.  The various methods of treatment have been discussed with the patient and family. After consideration of risks, benefits and other options for treatment, the patient has consented to  Procedure(s): COLONOSCOPY (N/A) EGD (ESOPHAGOGASTRODUODENOSCOPY) (N/A) as a surgical intervention.  The patient's history has been reviewed, patient examined, no change in status, stable for surgery.  I have reviewed the patient's chart and labs.  Questions were answered to the patient's satisfaction.     Elspeth P Melo Stauber

## 2024-05-28 NOTE — Anesthesia Procedure Notes (Signed)
 Procedure Name: MAC Date/Time: 05/28/2024 8:26 AM  Performed by: Arvell Edsel HERO, CRNAPre-anesthesia Checklist: Emergency Drugs available, Patient identified, Suction available, Patient being monitored and Timeout performed Patient Re-evaluated:Patient Re-evaluated prior to induction Oxygen Delivery Method: Simple face mask

## 2024-05-29 ENCOUNTER — Telehealth (HOSPITAL_COMMUNITY): Payer: Self-pay | Admitting: General Surgery

## 2024-05-29 ENCOUNTER — Encounter (HOSPITAL_COMMUNITY): Payer: Self-pay | Admitting: Gastroenterology

## 2024-05-29 ENCOUNTER — Inpatient Hospital Stay (HOSPITAL_COMMUNITY)

## 2024-05-29 ENCOUNTER — Telehealth (HOSPITAL_COMMUNITY): Payer: Self-pay

## 2024-05-29 ENCOUNTER — Other Ambulatory Visit (HOSPITAL_COMMUNITY): Payer: Self-pay | Admitting: General Surgery

## 2024-05-29 DIAGNOSIS — I509 Heart failure, unspecified: Secondary | ICD-10-CM

## 2024-05-29 DIAGNOSIS — E860 Dehydration: Secondary | ICD-10-CM

## 2024-05-29 DIAGNOSIS — E872 Acidosis, unspecified: Secondary | ICD-10-CM

## 2024-05-29 DIAGNOSIS — E871 Hypo-osmolality and hyponatremia: Secondary | ICD-10-CM

## 2024-05-29 LAB — ECHOCARDIOGRAM COMPLETE
Area-P 1/2: 5.97 cm2
Height: 71 in
S' Lateral: 3.6 cm
Weight: 2275.15 [oz_av]

## 2024-05-29 LAB — SURGICAL PATHOLOGY

## 2024-05-29 LAB — BASIC METABOLIC PANEL WITH GFR
Anion gap: 10 (ref 5–15)
BUN: 16 mg/dL (ref 8–23)
CO2: 21 mmol/L — ABNORMAL LOW (ref 22–32)
Calcium: 7.8 mg/dL — ABNORMAL LOW (ref 8.9–10.3)
Chloride: 101 mmol/L (ref 98–111)
Creatinine, Ser: 0.9 mg/dL (ref 0.61–1.24)
GFR, Estimated: >60 mL/min (ref >60)
Glucose, Bld: 133 mg/dL — ABNORMAL HIGH (ref 70–99)
Potassium: 3.4 mmol/L — ABNORMAL LOW (ref 3.5–5.1)
Sodium: 132 mmol/L — ABNORMAL LOW (ref 135–145)

## 2024-05-29 LAB — CBC
HCT: 25.9 % — ABNORMAL LOW (ref 39.0–52.0)
Hemoglobin: 8.5 g/dL — ABNORMAL LOW (ref 13.0–17.0)
MCH: 31.5 pg (ref 26.0–34.0)
MCHC: 32.8 g/dL (ref 30.0–36.0)
MCV: 95.9 fL (ref 80.0–100.0)
Platelets: 261 K/uL (ref 150–400)
RBC: 2.7 MIL/uL — ABNORMAL LOW (ref 4.22–5.81)
RDW: 15.5 % (ref 11.5–15.5)
WBC: 9.2 K/uL (ref 4.0–10.5)
nRBC: 0 % (ref 0.0–0.2)

## 2024-05-29 MED ORDER — PERFLUTREN LIPID MICROSPHERE
1.0000 mL | INTRAVENOUS | Status: AC | PRN
  Administered 2024-05-29: 4 mL via INTRAVENOUS

## 2024-05-29 MED ORDER — FERROUS SULFATE 325 (65 FE) MG PO TABS
325.0000 mg | ORAL_TABLET | Freq: Every day | ORAL | Status: DC
  Administered 2024-05-29 – 2024-05-30 (×2): 325 mg via ORAL
  Filled 2024-05-29 (×2): qty 1

## 2024-05-29 MED ORDER — POTASSIUM CHLORIDE CRYS ER 20 MEQ PO TBCR
40.0000 meq | EXTENDED_RELEASE_TABLET | Freq: Once | ORAL | Status: AC
  Administered 2024-05-29: 40 meq via ORAL
  Filled 2024-05-29: qty 2

## 2024-05-29 NOTE — TOC Initial Note (Addendum)
 Transition of Care (TOC) - Initial/Assessment Note   Spoke to patient at bedside. Patient from home with wife. Has Rollator, bedside commode , cane and grab bars at home already.   PT/OT recommending home health, patient in agreement. NCM offered choice. Patient has no preference.   Amy with [facility] accepted referral for HHRN,PT,OT. Secure chatted Attending team for orders and face to face   Attending team only wants HHPT, not HHRN or HHOT. Order discontinued . Order for HHPT entered by team.   Amy with [facility] updated   Received consult Pt would like a new PCP within [facility]. Message sent to CMA . Once appointment scheduled CMA will place information on AVS  Patient Details  Name: Ronald Kaiser MRN: [id number] Date of Birth: Feb 04, 1934  Transition of Care [facility]) CM/SW Contact:    Stephane Powell Jansky, RN Phone Number: 05/29/2024, 10:48 AM  Clinical Narrative:                   Expected Discharge Plan: Home w Home Health Services Barriers to Discharge: Continued Medical Work up   Patient Goals and CMS Choice Patient states their goals for this hospitalization and ongoing recovery are:: to return to home CMS Medicare.gov Compare Post Acute Care list provided to:: Patient Choice offered to / list presented to : Patient      Expected Discharge Plan and Services   Discharge Planning Services: CM Consult Post Acute Care Choice: Home Health Living arrangements for the past 2 months: Single Family Home                 DME Arranged: N/A DME Agency: NA       HH Arranged: RN, PT, OT (asked for orders from attending team) HH Agency: [facility] Date [facility] Agency Contacted: 05/29/24 Time HH Agency Contacted: 1046 Representative spoke with at [facility] Agency: Amy  Prior Living Arrangements/Services Living arrangements for the past 2 months: Single Family Home Lives with:: Spouse Patient language and need for interpreter reviewed:: Yes Do you feel safe going back to  the place where you live?: Yes      Need for Family Participation in Patient Care: Yes (Comment) Care giver support system in place?: Yes (comment) Current home services: DME Criminal Activity/Legal Involvement Pertinent to Current Situation/Hospitalization: No - Comment as needed  Activities of Daily Living   ADL Screening (condition at time of admission) Independently performs ADLs?: Yes (appropriate for developmental age) Is the patient deaf or have difficulty hearing?: No Does the patient have difficulty seeing, even when wearing glasses/contacts?: No Does the patient have difficulty concentrating, remembering, or making decisions?: No  Permission Sought/Granted   Permission granted to share information with : Yes, Verbal Permission Granted     Permission granted to share info w AGENCY: [facility]        Emotional Assessment Appearance:: Appears stated age Attitude/Demeanor/Rapport: Engaged Affect (typically observed): Appropriate Orientation: : Oriented to Self, Oriented to Place, Oriented to  Time, Oriented to Situation Alcohol / Substance Use: Not Applicable Psych Involvement: No (comment)  Admission diagnosis:  Symptomatic anemia [D64.9] Patient Active Problem List   Diagnosis Date Noted   Radiation proctitis 05/28/2024   Gastric erythema 05/28/2024   Rectal bleeding 05/27/2024   Iron  deficiency anemia 05/27/2024   Symptomatic anemia 05/26/2024   Malignant neoplasm of prostate metastatic to bone (HCC) 02/26/2021   Bladder outlet obstruction 11/27/2018   Acute kidney injury (HCC)    Hypokalemia    Other hydronephrosis  Acute on chronic systolic CHF (congestive heart failure) (HCC) 11/11/2018   AKI (acute kidney injury) (HCC) 11/11/2018   Basal cell carcinoma (BCC) of skin of nose 01/24/2018   Abnormal nuclear cardiac imaging test    Actinic keratoses 01/13/2016   CHF (congestive heart failure) (HCC) 01/13/2016   Male erectile disorder 01/13/2016    Osteoarthritis 01/13/2016   Seborrheic keratoses 01/13/2016   Bruit 11/23/2012   JDMS (juvenile dermatomyositis) (HCC) 11/25/2011   Mixed hyperlipidemia 11/25/2011   Coronary arteriosclerosis 11/25/2011   Hypertension 11/25/2011   History of prostate cancer 10/03/2001   PCP:  Tanda Prentice DEL, MD Pharmacy:   [facility] # [address], [place] - [address] [place] [address] Phone: [phone] Fax: [phone]  [facility] - [place], [place] - [address] [place] [address] Phone: [phone] Fax: [phone]     Social Drivers of Health (SDOH) Social History: SDOH Screenings   Food Insecurity: No Food Insecurity (05/26/2024)  Housing: Low Risk  (05/26/2024)  Transportation Needs: No Transportation Needs (05/26/2024)  Utilities: Not At Risk (05/26/2024)  Social Connections: Moderately Isolated (05/26/2024)  Tobacco Use: Medium Risk (05/28/2024)   SDOH Interventions:     Readmission Risk Interventions     No data to display

## 2024-05-29 NOTE — Progress Notes (Addendum)
 HD#3 SUBJECTIVE:  Patient Summary: Ronald Kaiser is a 89 y.o. with a pertinent PMH of HTN, HLD, HFrEF, and prostate cancer, who presented with dizziness and diarrhea and admitted for concern for UGIB.   Overnight Events: n/a  Interim History: The patient was seen and examined at the bedside this morning. He reported that he was comfortable, but continuing to feel very weak. He reports that he has been noticing increasing weakness over the last little bit at home. He continues to deny melena, hematochezia, and hematuria.   OBJECTIVE:  Vital Signs: Vitals:   05/29/24 0819 05/29/24 0821 05/29/24 0823 05/29/24 0826  BP:      Pulse: 63 88 (!) 117 (!) 115  Resp:      Temp:      TempSrc:      SpO2:      Weight:      Height:       Supplemental O2: Room Air SpO2: 94 %  Filed Weights   05/28/24 0809 05/29/24 0736  Weight: 65.3 kg 64.5 kg     Intake/Output Summary (Last 24 hours) at 05/29/2024 0939 Last data filed at 05/28/2024 2032 Gross per 24 hour  Intake 600 ml  Output --  Net 600 ml   Net IO Since Admission: 3,182.2 mL [05/29/24 0939]  Physical Exam: Const: Awake, alert in NAD HENT: Normocephalic, atraumatic, mucus membranes moist  Eyes: Conjunctiva pale Card: RRR, No MRG, No pitting edema on LE's bilaterally  Resp: LCTAB, no increased work of breathing Abd: Soft, mildly tender in the suprapubic area Extremities: Warm, pink   Patient Lines/Drains/Airways Status     Active Line/Drains/Airways     Name Placement date Placement time Site Days   Peripheral IV 05/26/24 20 G Anterior;Distal;Right;Upper Arm 05/26/24  1410  Arm  1   Wound 05/26/24 2137 Other (Comment) Head Upper 05/26/24  2137  Head  1            Pertinent labs and imaging:      Latest Ref Rng & Units 05/29/2024    2:50 AM 05/28/2024    3:18 AM 05/27/2024    7:33 AM  CBC  WBC 4.0 - 10.5 K/uL 9.2  7.5  6.4   Hemoglobin 13.0 - 17.0 g/dL 8.5  9.1  8.3   Hematocrit 39.0 - 52.0 % 25.9  27.6  24.9    Platelets 150 - 400 K/uL 261  264  230        Latest Ref Rng & Units 05/29/2024    2:50 AM 05/28/2024    3:18 AM 05/27/2024    7:01 AM  CMP  Glucose 70 - 99 mg/dL 866  891  92   BUN 8 - 23 mg/dL 16  17  26    Creatinine 0.61 - 1.24 mg/dL 9.09  9.08  9.09   Sodium 135 - 145 mmol/L 132  130  132   Potassium 3.5 - 5.1 mmol/L 3.4  4.7  3.7   Chloride 98 - 111 mmol/L 101  98  102   CO2 22 - 32 mmol/L 21  19  25    Calcium  8.9 - 10.3 mg/dL 7.8  8.3  8.5     No results found.   ASSESSMENT/PLAN:  Assessment: Principal Problem:   Symptomatic anemia Active Problems:   Mixed hyperlipidemia   Hypertension   CHF (congestive heart failure) (HCC)   Malignant neoplasm of prostate metastatic to bone [facility])   Rectal bleeding   Iron  deficiency anemia  Radiation proctitis   Gastric erythema   Plan: #Hypotension #Orthostatic hypotension #Weakness -Pt remains orthostatic with pressures dipping to 68/50 when standing. There was minimal improvement with fluid bolus earlier this hospitalization. On exam, he did not appear to be hypovolemic.  -Initiate compression stockings and monitor for improvement -Continue to hold lisinopril  and metoprolol  due to soft pressures and will plan to hold on discharge as well.  -The pt complains of weakness and decreasing mobility at home--will have PT/OT eval today.   #Concern for GI Bleed #Chronic Diarrhea #Anemia -Pt with anemia on admission (Hgb 8.8) and hx of melena with FOBT positive, however has been on iron  supplementation outpatient which could explain dark stools and positive fobt.  -s/p EGD and Colonoscopy 8/26 with findings of GI severe radiation proctitis treated with APC and significant radiation changes in the lower colon which prohibited full colonoscopy. EGD negative.  -Per GI recommendations, sucralfate suppositories twice daily for 2 weeks.  -s/p 500mg  venofer .  -Oral iron  supplementation  #HFrEF -Last echo on 5/22 with EF 30-35% with  mitral regurgitation -Currently without exacerbation -With persistent orthostatic hypotension and syncopal episodes at home, will consider repeating echo.   #Hyponatremia -Patient remains mildly hyponatremic -Suspect this is due to frequent diarrhea and bowel prep. -Continue to encourage PO intake.  -Continue to monitor with daily BMPs  Best Practice: Diet: Cardiac diet IVF: Fluids: LR, Rate: 500 cc/hr x 2 hrs VTE: SCDs Start: 05/26/24 1805 Code: Full  Disposition planning: Therapy Recs: Pending, DME: walker Family Contact: Della Side, to be notified. DISPO: Anticipated discharge pending to Home pending clinical improvement.  Signature:  Schuyler Novak, DO [facility]  9:39 AM, 05/29/2024  On Call pager [phone]

## 2024-05-29 NOTE — Care Management Important Message (Signed)
 Important Message  Patient Details  Name: Ronald Kaiser MRN: [id number] Date of Birth: 1933/12/22   Important Message Given:  Yes - Medicare IM     Jon Cruel 05/29/2024, 4:47 PM

## 2024-05-29 NOTE — Evaluation (Signed)
 Physical Therapy Evaluation Patient Details Name: Ronald Kaiser MRN: [id number] DOB: 1934-06-21 Today's Date: 05/29/2024  History of Present Illness  Pt is an 89 y.o. male presenting 8/24 with diarrhea and dizziness/near syncope. Admitted for concern for GIB, anemia, HFrEF, hyponatremia. S/p colonoscopy and EGD 8/26. PMH: prostate CA stave IV, MI, HFrEF, HTN, HLD.  Clinical Impression  Pt presents to PT with deficits in activity tolerance, strength, power. PT notes orthostatic BP from earlier in the morning with occupational therapy and nurse tech. BP assessed pre-mobility in supine and post-mobility in standing, pt asymptomatic throughout session. Pt is able to transfer and ambulate with support of a RW at this time. Pt reports 2 recent falls and expresses recent concerns over his balance. PT recommends HHPT follow-up at the time of discharge for further balance training. If orthostatic BP persists use of compression stockings may be beneficial.        If plan is discharge home, recommend the following: Help with stairs or ramp for entrance   Can travel by private vehicle        Equipment Recommendations None recommended by PT  Recommendations for Other Services       Functional Status Assessment Patient has had a recent decline in their functional status and demonstrates the ability to make significant improvements in function in a reasonable and predictable amount of time.     Precautions / Restrictions Precautions Precautions: Fall;Other (comment) (orthostatic BP this AM) Restrictions Weight Bearing Restrictions Per Provider Order: No      Mobility  Bed Mobility Overal bed mobility: Modified Independent                  Transfers Overall transfer level: Modified independent                      Ambulation/Gait Ambulation/Gait assistance: Modified independent (Device/Increase time) Gait Distance (Feet): 150 Feet Assistive device: Rolling walker (2  wheels) Gait Pattern/deviations: Step-through pattern Gait velocity: reduced Gait velocity interpretation: 1.31 - 2.62 ft/sec, indicative of limited community ambulator   General Gait Details: slowed step-through gait, increased trunk flexion over RW  Stairs            Wheelchair Mobility     Tilt Bed    Modified Rankin (Stroke Patients Only)       Balance Overall balance assessment: Needs assistance Sitting-balance support: Feet supported, No upper extremity supported Sitting balance-Leahy Scale: Good     Standing balance support: Single extremity supported, Reliant on assistive device for balance Standing balance-Leahy Scale: Poor                               Pertinent Vitals/Pain Pain Assessment Pain Assessment: No/denies pain    Home Living Family/patient expects to be discharged to:: Private residence Living Arrangements: Spouse/significant other Available Help at Discharge: Family;Available 24 hours/day Type of Home: House (townhome) Home Access: Other (comment)       Home Layout: One level Home Equipment: Rollator (4 wheels);Shower seat;Grab bars - tub/shower;Hand held shower head      Prior Function Prior Level of Function : Independent/Modified Independent;Driving             Mobility Comments: recent use of rollator since fall with bruising to face hitting toilet in the restroom one month ago. Leans onto shopping cart when grocery shopping or taking wife to look for clothing. ADLs Comments: independent in ADL, IADL, medication  management; asssists wife as needed at home; did not provide specifics other than assists with reading due to wife having AMD     Extremity/Trunk Assessment   Upper Extremity Assessment Upper Extremity Assessment: Generalized weakness    Lower Extremity Assessment Lower Extremity Assessment: Generalized weakness    Cervical / Trunk Assessment Cervical / Trunk Assessment: Kyphotic  Communication    Communication Communication: Impaired Factors Affecting Communication: Hearing impaired    Cognition Arousal: Alert Behavior During Therapy: WFL for tasks assessed/performed   PT - Cognitive impairments: No apparent impairments                         Following commands: Intact       Cueing Cueing Techniques: Verbal cues     General Comments General comments (skin integrity, edema, etc.): BP in supine pre-mobility is 109/55. BP in standing after ambulation is 97/52. Pt is asymptomatic throughout session    Exercises     Assessment/Plan    PT Assessment Patient needs continued PT services  PT Problem List Decreased strength;Decreased activity tolerance;Decreased balance;Decreased mobility;Decreased knowledge of use of DME;Decreased knowledge of precautions       PT Treatment Interventions DME instruction;Gait training;Stair training;Functional mobility training;Therapeutic activities;Therapeutic exercise;Balance training;Neuromuscular re-education;Patient/family education    PT Goals (Current goals can be found in the Care Plan section)  Acute Rehab PT Goals Patient Stated Goal: to return home PT Goal Formulation: With patient Time For Goal Achievement: 06/12/24 Potential to Achieve Goals: Good Additional Goals Additional Goal #1: Pt will independently verbalize 3 methods to reduce potential risk of orthostatic hypotension and syncope    Frequency Min 2X/week     Co-evaluation               AM-PAC PT 6 Clicks Mobility  Outcome Measure Help needed turning from your back to your side while in a flat bed without using bedrails?: None Help needed moving from lying on your back to sitting on the side of a flat bed without using bedrails?: None Help needed moving to and from a bed to a chair (including a wheelchair)?: None Help needed standing up from a chair using your arms (e.g., wheelchair or bedside chair)?: None Help needed to walk in hospital  room?: None Help needed climbing 3-5 steps with a railing? : A Little 6 Click Score: 23    End of Session   Activity Tolerance: Patient tolerated treatment well Patient left: in bed;with call bell/phone within reach;with bed alarm set Nurse Communication: Mobility status PT Visit Diagnosis: Other abnormalities of gait and mobility (R26.89);Muscle weakness (generalized) (M62.81)    Time: 9071-9047 PT Time Calculation (min) (ACUTE ONLY): 24 min   Charges:   PT Evaluation $PT Eval Low Complexity: 1 Low   PT General Charges $$ ACUTE PT VISIT: 1 Visit         Bernardino JINNY Ruth, PT, DPT Acute Rehabilitation Office [phone]   Bernardino JINNY Ruth 05/29/2024, 10:03 AM

## 2024-05-29 NOTE — Telephone Encounter (Signed)
 Auth Submission: NO AUTH NEEDED Site of care: Site of care: [facility] Payer: Medicare A/B, AARP Supplement Medication & CPT/J Code(s) submitted: Feraheme (ferumoxytol) [id number] Diagnosis Code: D50.9 Route of submission (phone, fax, portal):  Phone # Fax # Auth type: Buy/Bill HB Units/visits requested: 510mg  x 1 dose Reference number:  Approval from: 05/29/24 to 08/29/24

## 2024-05-29 NOTE — Progress Notes (Signed)
  Echocardiogram 2D Echocardiogram has been performed.  Koleen KANDICE Popper, RDCS 05/29/2024, 3:04 PM

## 2024-05-29 NOTE — Evaluation (Signed)
 Occupational Therapy Evaluation Patient Details Name: Ronald Kaiser MRN: [id number] DOB: Oct 20, 1933 Today's Date: 05/29/2024   History of Present Illness   Pt is an 89 y.o. male presenting 8/24 with diarrhea and dizziness/near syncope. Admitted for concern for GIB, anemia, HFrEF, hyponatremia. S/p colonoscopy and EGD 8/26. PMH: prostate CA stave IV, MI, HFrEF, HTN, HLD.     Clinical Impressions PTA, pt lived at home with wife; per pt, wife has aide on Mondays, but he often assists her with reading recipes and other IADL due to her having AMD. Pt recently using a rollator for the past month in light of 2 recent falls at home. Pt also used to push wife in a wheelchair in grocery store and clothing stores but has been unable to do this or take out his dog recently. Pt with positive orthostatic vitals at start of session with NT taking on arrival. Pt denies dizziness or other prodromal symptoms. Overall, pt needing supervision for BADL this session. Educated regarding optimizing safety/fall prevention in light of orthostatic vitals. Pt with generalized weakness and decr activity tolerance, thus, will follow acutely, and recommend PT consult. Do not suspect need for OT services following dc.    Lying: 108/53 HR 63 Seated: 83/51 HR 88 Standing 68/50 HR 117 Standing 3 mins 85/43 HR 115  After session standing EOB 96/64 (75) HR111    If plan is discharge home, recommend the following:   A little help with walking and/or transfers;A little help with bathing/dressing/bathroom;Assistance with cooking/housework;Assist for transportation;Help with stairs or ramp for entrance     Functional Status Assessment   Patient has had a recent decline in their functional status and demonstrates the ability to make significant improvements in function in a reasonable and predictable amount of time.     Equipment Recommendations   None recommended by OT     Recommendations for Other Services   PT  consult (2 falls in past month)     Precautions/Restrictions   Precautions Precautions: Fall;Other (comment) (orthostatic vitals) Restrictions Weight Bearing Restrictions Per Provider Order: No     Mobility Bed Mobility Overal bed mobility: Modified Independent             General bed mobility comments: increased time    Transfers Overall transfer level: Modified independent                 General transfer comment: for STS      Balance Overall balance assessment: Needs assistance Sitting-balance support: No upper extremity supported, Feet supported Sitting balance-Leahy Scale: Good     Standing balance support: Bilateral upper extremity supported, During functional activity, No upper extremity supported Standing balance-Leahy Scale: Fair Standing balance comment: benefits from UE support                           ADL either performed or assessed with clinical judgement   ADL Overall ADL's : Needs assistance/impaired Eating/Feeding: Independent   Grooming: Oral care;Supervision/safety;Standing Grooming Details (indicate cue type and reason): initially leaning onto forearms on sink after mobility into hall during set-up but then standing without support for performance of teeth brushing Upper Body Bathing: Modified independent;Sitting   Lower Body Bathing: Supervison/ safety;Sit to/from stand   Upper Body Dressing : Sitting;Independent   Lower Body Dressing: Supervision/safety;Sit to/from stand   Toilet Transfer: Supervision/safety;Transfer board;Rolling walker (2 wheels);Comfort height toilet           Functional mobility during ADLs: Supervision/safety;Rolling  walker (2 wheels)       Vision Baseline Vision/History: 1 Wears glasses Ability to See in Adequate Light: 0 Adequate Patient Visual Report: No change from baseline Vision Assessment?: No apparent visual deficits     Perception Perception: Not tested       Praxis  Praxis: Not tested       Pertinent Vitals/Pain Pain Assessment Pain Assessment: No/denies pain     Extremity/Trunk Assessment Upper Extremity Assessment Upper Extremity Assessment: Generalized weakness   Lower Extremity Assessment Lower Extremity Assessment: Generalized weakness;Defer to PT evaluation   Cervical / Trunk Assessment Cervical / Trunk Assessment: Kyphotic   Communication Communication Communication: No apparent difficulties   Cognition Arousal: Alert Behavior During Therapy: WFL for tasks assessed/performed Cognition: No apparent impairments             OT - Cognition Comments: pleasant and conversational                 Following commands: Intact       Cueing  General Comments   Cueing Techniques: Verbal cues  see vital sign flowsheets for orthostatics   Exercises     Shoulder Instructions      Home Living Family/patient expects to be discharged to:: Private residence Living Arrangements: Spouse/significant other Available Help at Discharge: Family;Available 24 hours/day Type of Home: House (town home) Home Access: Other (comment) (threshold)     Home Layout: One level     Bathroom Shower/Tub: Tub/shower unit;Walk-in shower (uses walk in)   [employer]: Handicapped height     Home Equipment: Rollator (4 wheels);Shower seat;Grab bars - tub/shower;Hand held shower head (does not use shower seat at baseline)          Prior Functioning/Environment Prior Level of Function : Independent/Modified Independent;Driving             Mobility Comments: recent use of rollator since fall with bruising to face hitting toilet in the restroom one month ago. Leans onto shopping cart when grocery shopping or taking wife to look for clothing. ADLs Comments: independent in ADL, IADL, medication management; asssists wife as needed at home; did not provide specifics other than assists with reading due to wife having AMD    OT Problem  List: Decreased strength;Decreased activity tolerance;Impaired balance (sitting and/or standing);Decreased knowledge of use of DME or AE;Cardiopulmonary status limiting activity   OT Treatment/Interventions: Self-care/ADL training;Therapeutic exercise;Energy conservation;DME and/or AE instruction;Therapeutic activities;Patient/family education;Balance training      OT Goals(Current goals can be found in the care plan section)   Acute Rehab OT Goals Patient Stated Goal: get better OT Goal Formulation: With patient Time For Goal Achievement: 06/12/24 Potential to Achieve Goals: Good   OT Frequency:  Min 2X/week    Co-evaluation              AM-PAC OT 6 Clicks Daily Activity     Outcome Measure Help from another person eating meals?: None Help from another person taking care of personal grooming?: A Little Help from another person toileting, which includes using toliet, bedpan, or urinal?: A Little Help from another person bathing (including washing, rinsing, drying)?: A Little Help from another person to put on and taking off regular upper body clothing?: None Help from another person to put on and taking off regular lower body clothing?: A Little 6 Click Score: 20   End of Session Equipment Utilized During Treatment: Gait belt;Rolling walker (2 wheels) Nurse Communication: Mobility status  Activity Tolerance: Patient tolerated treatment well Patient  left: in bed;with bed alarm set;with call bell/phone within reach  OT Visit Diagnosis: Unsteadiness on feet (R26.81);Muscle weakness (generalized) (M62.81)                Time: 9179-9149 OT Time Calculation (min): 30 min Charges:  OT General Charges $OT Visit: 1 Visit OT Evaluation $OT Eval Low Complexity: 1 Low OT Treatments $Self Care/Home Management : 8-22 mins  Elma JONETTA Lebron FREDERICK, OTR/L [facility] Acute Rehabilitation Office: [phone]   Elma JONETTA Lebron 05/29/2024, 9:14 AM

## 2024-05-29 NOTE — Telephone Encounter (Addendum)
 Patient referred to infusion pharmacy team for ambulatory infusion of IV iron .  Insurance - Medicare A and B   Site of care - Site of care: [facility] Dx code - 50.9/D50.9/D64.9 IV Iron  Therapy - Feraheme 510 mg IV x1. Patient received Venofer  500 mg IV x 1 while inpatient. Infusion appointments - Scheduling team will schedule patient as soon as possible.   Hilario Robarts D. Taneasha Fuqua, PharmD

## 2024-05-29 NOTE — Plan of Care (Signed)
 Pt tolerated iron  sucrose well last night. VSS on room air. Pt had 1 loose, small, bloody stool this morning. Vitals signs within normal range.   Problem: Education: Goal: Knowledge of General Education information will improve Description: Including pain rating scale, medication(s)/side effects and non-pharmacologic comfort measures 05/29/2024 0632 by Jack Domnick LABOR, RN Outcome: Progressing 05/29/2024 0631 by Jack Domnick LABOR, RN Outcome: Progressing   Problem: Health Behavior/Discharge Planning: Goal: Ability to manage health-related needs will improve 05/29/2024 0632 by Jack Domnick LABOR, RN Outcome: Progressing 05/29/2024 0631 by Jack Domnick LABOR, RN Outcome: Progressing   Problem: Clinical Measurements: Goal: Ability to maintain clinical measurements within normal limits will improve 05/29/2024 9367 by Jack Domnick LABOR, RN Outcome: Progressing 05/29/2024 0631 by Jack Domnick LABOR, RN Outcome: Progressing Goal: Will remain free from infection 05/29/2024 9367 by Jack Domnick LABOR, RN Outcome: Progressing 05/29/2024 0631 by Jack Domnick LABOR, RN Outcome: Progressing Goal: Diagnostic test results will improve 05/29/2024 9367 by Jack Domnick LABOR, RN Outcome: Progressing 05/29/2024 0631 by Jack Domnick LABOR, RN Outcome: Progressing Goal: Respiratory complications will improve 05/29/2024 9367 by Jack Domnick LABOR, RN Outcome: Progressing 05/29/2024 0631 by Jack Domnick LABOR, RN Outcome: Progressing Goal: Cardiovascular complication will be avoided 05/29/2024 9367 by Jack Domnick LABOR, RN Outcome: Progressing 05/29/2024 0631 by Jack Domnick LABOR, RN Outcome: Progressing   Problem: Activity: Goal: Risk for activity intolerance will decrease 05/29/2024 9367 by Jack Domnick LABOR, RN Outcome: Progressing 05/29/2024 0631 by Jack Domnick LABOR, RN Outcome: Progressing   Problem: Nutrition: Goal: Adequate nutrition will be maintained 05/29/2024 9367 by Jack Domnick LABOR, RN Outcome: Progressing 05/29/2024 0631 by  Jack Domnick LABOR, RN Outcome: Progressing   Problem: Coping: Goal: Level of anxiety will decrease 05/29/2024 9367 by Jack Domnick LABOR, RN Outcome: Progressing 05/29/2024 0631 by Jack Domnick LABOR, RN Outcome: Progressing   Problem: Elimination: Goal: Will not experience complications related to bowel motility 05/29/2024 0632 by Jack Domnick LABOR, RN Outcome: Progressing 05/29/2024 0631 by Jack Domnick LABOR, RN Outcome: Progressing Goal: Will not experience complications related to urinary retention 05/29/2024 9367 by Jack Domnick LABOR, RN Outcome: Progressing 05/29/2024 0631 by Jack Domnick LABOR, RN Outcome: Progressing   Problem: Pain Managment: Goal: General experience of comfort will improve and/or be controlled 05/29/2024 9367 by Jack Domnick LABOR, RN Outcome: Progressing 05/29/2024 0631 by Jack Domnick LABOR, RN Outcome: Progressing   Problem: Safety: Goal: Ability to remain free from injury will improve 05/29/2024 9367 by Jack Domnick LABOR, RN Outcome: Progressing 05/29/2024 0631 by Jack Domnick LABOR, RN Outcome: Progressing   Problem: Skin Integrity: Goal: Risk for impaired skin integrity will decrease 05/29/2024 9367 by Jack Domnick LABOR, RN Outcome: Progressing 05/29/2024 0631 by Jack Domnick LABOR, RN Outcome: Progressing

## 2024-05-30 ENCOUNTER — Telehealth: Payer: Self-pay | Admitting: Cardiology

## 2024-05-30 ENCOUNTER — Ambulatory Visit: Payer: Self-pay | Admitting: Gastroenterology

## 2024-05-30 DIAGNOSIS — I480 Paroxysmal atrial fibrillation: Secondary | ICD-10-CM | POA: Diagnosis not present

## 2024-05-30 DIAGNOSIS — I951 Orthostatic hypotension: Secondary | ICD-10-CM

## 2024-05-30 DIAGNOSIS — K627 Radiation proctitis: Principal | ICD-10-CM

## 2024-05-30 DIAGNOSIS — C7951 Secondary malignant neoplasm of bone: Secondary | ICD-10-CM

## 2024-05-30 DIAGNOSIS — I5020 Unspecified systolic (congestive) heart failure: Secondary | ICD-10-CM

## 2024-05-30 DIAGNOSIS — K922 Gastrointestinal hemorrhage, unspecified: Secondary | ICD-10-CM

## 2024-05-30 LAB — CBC
HCT: 26.1 % — ABNORMAL LOW (ref 39.0–52.0)
Hemoglobin: 8.8 g/dL — ABNORMAL LOW (ref 13.0–17.0)
MCH: 32 pg (ref 26.0–34.0)
MCHC: 33.7 g/dL (ref 30.0–36.0)
MCV: 94.9 fL (ref 80.0–100.0)
Platelets: 290 K/uL (ref 150–400)
RBC: 2.75 MIL/uL — ABNORMAL LOW (ref 4.22–5.81)
RDW: 15.7 % — ABNORMAL HIGH (ref 11.5–15.5)
WBC: 9.5 K/uL (ref 4.0–10.5)
nRBC: 0 % (ref 0.0–0.2)

## 2024-05-30 LAB — BASIC METABOLIC PANEL WITH GFR
Anion gap: 8 (ref 5–15)
BUN: 10 mg/dL (ref 8–23)
CO2: 24 mmol/L (ref 22–32)
Calcium: 8.4 mg/dL — ABNORMAL LOW (ref 8.9–10.3)
Chloride: 104 mmol/L (ref 98–111)
Creatinine, Ser: 0.82 mg/dL (ref 0.61–1.24)
GFR, Estimated: >60 mL/min (ref >60)
Glucose, Bld: 106 mg/dL — ABNORMAL HIGH (ref 70–99)
Potassium: 3.6 mmol/L (ref 3.5–5.1)
Sodium: 136 mmol/L (ref 135–145)

## 2024-05-30 NOTE — Telephone Encounter (Signed)
 Patient is currently admitted. Wife says he has been taken off heart medications for 4 days. She says they are planning on discharging patient soon and she would like to have someone from the office review medications before patient is released.

## 2024-05-30 NOTE — Discharge Instructions (Addendum)
 Thank you for allowing us  to be part of your care. You were hospitalized for low blood counts. We treated you with a colonoscopy, an EGD, iron , and fluids.  See the changes in your medications and management of your chronic conditions below:  *For your heart disease -We have STOPPED you on these following medications:  -Lisinopril   -Metoprolol   **Do NOT take these medications unless otherwise instructed by a doctor.   -Make sure to take your time standing up  *For your Radiation Proctitis (The cause of your bloody stools) -We have STARTED you on these following medications:  -Sucralfate 50 mg suppositories-- Place in the rectum once every twelve hours for 14 days.   -Please see your pcp in 7 to 10 days  FOLLOW UP APPOINTMENTS: You have been set up with a new Primary Care Doctor: Quintin PARAS de [place] at [facility] An appointment has been scheduled on 08/22/2024 at 1:50 Please call them to schedule a hospital follow up   Please make sure to call your doctor or cardiologist if you develop dizziness or pass out.   Please call your PCP or our clinic if you have any questions or concerns, we may be able to help and keep you from a long and expensive emergency room wait. Our clinic and after hours phone number is [phone]. The best time to call is Monday through Friday 9 am to 4 pm but there is always someone available 24/7 if you have an emergency. If you need medication refills please notify your pharmacy one week in advance and they will send us  a request.   We are glad you are feeling better,  Schuyler Novak Internal Medicine Inpatient Teaching Service at [facility]

## 2024-05-30 NOTE — Discharge Summary (Signed)
 Name: Ronald Kaiser MRN: [id number] DOB: 1933/12/12 89 y.o. PCP: Tanda Prentice DEL, MD  Date of Admission: 05/26/2024 11:22 AM Date of Discharge: 05/30/2024 Attending Physician: Dr. Jone Dauphin  Discharge Diagnosis: 1. Principal Problem:   Symptomatic anemia Active Problems:   Mixed hyperlipidemia   Hypertension   CHF (congestive heart failure) (HCC)   Malignant neoplasm of prostate metastatic to bone [facility])   Rectal bleeding   Iron  deficiency anemia   Radiation proctitis   Gastric erythema   Paroxysmal A-fib (HCC)   Discharge Medications: Allergies as of 05/30/2024       Reactions   Bactrim [sulfamethoxazole-trimethoprim] Hives, Shortness Of Breath, Swelling   Amoxicillin-pot Clavulanate Diarrhea   Crestor  [rosuvastatin  Calcium ] Other (See Comments)   Back pain,grogginess and dizziness   Zetia  [ezetimibe ] Other (See Comments)   Back pain, grogginess and dizziness        Medication List     STOP taking these medications    lisinopril  5 MG tablet Commonly known as: ZESTRIL    metoprolol  tartrate 25 MG tablet Commonly known as: LOPRESSOR        TAKE these medications    AMBULATORY NON FORMULARY MEDICATION Sucralfate 50 mg suppositories PR twice daily x 14 days   aspirin  EC 81 MG tablet Take 81 mg by mouth daily. Swallow whole.   CALCIUM  PO Take 1 tablet by mouth daily.   CENTRUM SILVER ULTRA MENS PO Take 1 tablet by mouth daily.   ferrous sulfate  325 (65 FE) MG EC tablet Take 325 mg by mouth daily with breakfast.   furosemide  20 MG tablet Commonly known as: LASIX  Take 20 mg by mouth as needed for fluid or edema.   nitroGLYCERIN  0.4 MG SL tablet Commonly known as: NITROSTAT  Place 1 tablet (0.4 mg total) under the tongue every 5 (five) minutes as needed.   pravastatin  20 MG tablet Commonly known as: PRAVACHOL  Take 1 tablet (20 mg total) by mouth every evening.   Xtandi  40 MG capsule Generic drug: enzalutamide  Take 160 mg by mouth daily.         Disposition and follow-up:   Ronald Kaiser was discharged from [facility] in Good condition.  At the hospital follow up visit please address:  1.  Hypotension--metoprolol  and lisinopril  was discontinued this admission due to persistently soft pressures and orthostatic hypotension 2. Atrial Fibrillation--ensure pt is not having syncopal episodes at home and is following up with his cardiologist 3. Radiation proctitis with anemia--ensure pt is taking suppositories as GI prescribed and is no longer symptomatically anemic  2.  Labs / imaging needed at time of follow-up: CBC (Follow up anemia), RFP (Check electrolytes)   Follow-up Appointments:  Follow-up Information     [facility] Follow up.   Specialty: Home Health Services Why: [phone] Contact information: [address] [place] [address] [phone]         de [place], Raymond J, MD Follow up on 08/22/2024.   Specialty: Family Medicine Why: Appointment Time 1:50 pm,please arreive @1 :35 Contact information: [address] [place] [address] [phone]                  Hospital Course by problem list: Ronald Kaiser is a 89 y.o. person living with a history of paroxysmal afib, HFrEF, prostate cancer, HTN who presented with anemia and admitted for acute GI bleed with anemia  now being discharged on hospital day 4 with the following pertinent hospital course:  #Acute GI Bleed #  Iron  Deficiency Anemia The patient was admitted on 8/14 with melena, blood streaked stool and a Hgb of 8.8. On admission, he did have a positive FOBT. GI was consulted and the patient elective to have an inpatient workup. He underwent colonoscopy and EGD on 8/26 which showed severe radiation proctitis treated with APC and significant radiation changes in the lower colon which prohibited full colonoscopy. EGD negative. He was given 14 days of sucralfate suppositories per GI and instructed to follow  up outpatient. He was deemed fit for discharge.   #Hypotension #Orthostatic hypotension #Atrial fibrillation #Syncope #HFrEF Following admission, it was noted that the patient was becoming orthostatic with significant drops in blood pressure when transitioning from laying to sitting. This was thought to be due to both medications, including lisinopril  and metoprolol , and also hypovolemia due to diarrhea and subsequent bowel prep. He was given a liter bolus and instructed to increase oral intake and his antihypertensives were discontinued. His orthostatic hypotension persisted, however the patient remained asymptomatic when transferring to the restroom and when working with therapy. He was given compression stocking and an echo cardiogram was performed to rule out new cardiac changes. During his echo it was noted that he was in a-fib, however this had been noted in previous cardiology notes as paroxysmal a-fib and the patient had declined anticoagulation. Due to the asymptomatic nature, the patient was deemed safe for discharge with close outpatient follow up.     Stable chronic medical conditions: Prostate Cancer HLD HFrEF  Subjective The patient was seen and examined at the bedside this morning and was feeling well overall. His afib diagnosis was discussed and he understood and will follow up with his cardiologist. He is agreeable to discharge today with [facility] PT.  Discharge Exam:   BP (!) 97/58 (BP Location: Left Arm)   Pulse 79   Temp 98.2 F (36.8 C) (Oral)   Resp 17   Ht 5' 11 (1.803 m)   Wt 64.5 kg   SpO2 96%   BMI 19.83 kg/m  Discharge exam:  Const: Awake, alert in NAD HENT: Normocephalic, atraumatic, mucus membranes moist Card: RRR, No MRG, No pitting edema on LE's bilaterally  Resp: LCTAB, no increased work of breathing Abd: Soft, NTND, Extremities: Warm, pink   Pertinent Labs, Studies, and Procedures:     Latest Ref Rng & Units 05/30/2024    5:04 AM 05/29/2024    2:50  AM 05/28/2024    3:18 AM  CBC  WBC 4.0 - 10.5 K/uL 9.5  9.2  7.5   Hemoglobin 13.0 - 17.0 g/dL 8.8  8.5  9.1   Hematocrit 39.0 - 52.0 % 26.1  25.9  27.6   Platelets 150 - 400 K/uL 290  261  264        Latest Ref Rng & Units 05/30/2024    5:04 AM 05/29/2024    2:50 AM 05/28/2024    3:18 AM  CMP  Glucose 70 - 99 mg/dL 893  866  891   BUN 8 - 23 mg/dL 10  16  17    Creatinine 0.61 - 1.24 mg/dL 9.17  9.09  9.08   Sodium 135 - 145 mmol/L 136  132  130   Potassium 3.5 - 5.1 mmol/L 3.6  3.4  4.7   Chloride 98 - 111 mmol/L 104  101  98   CO2 22 - 32 mmol/L 24  21  19    Calcium  8.9 - 10.3 mg/dL 8.4  7.8  8.3  CT Head Wo Contrast Result Date: 05/26/2024 CLINICAL DATA:  Blunt trauma to head and neck.  Pain. EXAM: CT HEAD WITHOUT CONTRAST CT CERVICAL SPINE WITHOUT CONTRAST TECHNIQUE: Multidetector CT imaging of the head and cervical spine was performed following the standard protocol without intravenous contrast. Multiplanar CT image reconstructions of the cervical spine were also generated. RADIATION DOSE REDUCTION: This exam was performed according to the departmental dose-optimization program which includes automated exposure control, adjustment of the mA and/or kV according to patient size and/or use of iterative reconstruction technique. COMPARISON:  None Available. FINDINGS: CT HEAD FINDINGS Brain: No evidence of intracranial hemorrhage, acute infarction, hydrocephalus, extra-axial collection, or mass lesion/mass effect. Mild-to-moderate diffuse cerebral and cerebellar atrophy noted. Vascular:  No hyperdense vessel or other acute findings. Skull: No evidence of fracture or other significant bone abnormality. Sinuses/Orbits:  No acute findings. Other: None. CT CERVICAL SPINE FINDINGS Alignment: Normal. Skull base and vertebrae: No acute fracture. No primary bone lesion or focal pathologic process. Soft tissues and spinal canal: No prevertebral fluid or swelling. No visible canal hematoma. Disc  levels: Moderate to severe degenerative disc disease is seen at all cervical levels, greatest at C6-7. Mild multilevel facet DJD is seen on the left. Upper chest: No acute findings. Other: Sclerotic lesions are seen involving the right lateral mass of C1, right T1 lamina, and left T2 lamina,, consistent with sclerotic bone metastases given patient's history of prostate carcinoma. IMPRESSION: No acute intracranial abnormality.  Cerebral and cerebellar atrophy. No evidence of cervical spine fracture or subluxation. Sclerotic bone metastases involving the right lateral mass of C1, right T1 lamina, and left T2 lamina. Electronically Signed   By: Norleen DELENA Kil M.D.   On: 05/26/2024 16:40   CT Cervical Spine Wo Contrast Result Date: 05/26/2024 CLINICAL DATA:  Blunt trauma to head and neck.  Pain. EXAM: CT HEAD WITHOUT CONTRAST CT CERVICAL SPINE WITHOUT CONTRAST TECHNIQUE: Multidetector CT imaging of the head and cervical spine was performed following the standard protocol without intravenous contrast. Multiplanar CT image reconstructions of the cervical spine were also generated. RADIATION DOSE REDUCTION: This exam was performed according to the departmental dose-optimization program which includes automated exposure control, adjustment of the mA and/or kV according to patient size and/or use of iterative reconstruction technique. COMPARISON:  None Available. FINDINGS: CT HEAD FINDINGS Brain: No evidence of intracranial hemorrhage, acute infarction, hydrocephalus, extra-axial collection, or mass lesion/mass effect. Mild-to-moderate diffuse cerebral and cerebellar atrophy noted. Vascular:  No hyperdense vessel or other acute findings. Skull: No evidence of fracture or other significant bone abnormality. Sinuses/Orbits:  No acute findings. Other: None. CT CERVICAL SPINE FINDINGS Alignment: Normal. Skull base and vertebrae: No acute fracture. No primary bone lesion or focal pathologic process. Soft tissues and spinal  canal: No prevertebral fluid or swelling. No visible canal hematoma. Disc levels: Moderate to severe degenerative disc disease is seen at all cervical levels, greatest at C6-7. Mild multilevel facet DJD is seen on the left. Upper chest: No acute findings. Other: Sclerotic lesions are seen involving the right lateral mass of C1, right T1 lamina, and left T2 lamina,, consistent with sclerotic bone metastases given patient's history of prostate carcinoma. IMPRESSION: No acute intracranial abnormality.  Cerebral and cerebellar atrophy. No evidence of cervical spine fracture or subluxation. Sclerotic bone metastases involving the right lateral mass of C1, right T1 lamina, and left T2 lamina. Electronically Signed   By: Norleen DELENA Kil M.D.   On: 05/26/2024 16:40   CT ABDOMEN PELVIS W CONTRAST Result Date:  05/26/2024 EXAM: CT ABDOMEN AND PELVIS WITH CONTRAST 05/26/2024 02:56:21 PM TECHNIQUE: CT of the abdomen and pelvis was performed with the administration of intravenous contrast (75mL iohexol  (OMNIPAQUE ) 350 MG/ML injection). Multiplanar reformatted images are provided for review. Automated exposure control, iterative reconstruction, and/or weight-based adjustment of the mA/kV was utilized to reduce the radiation dose to as low as reasonably achievable. COMPARISON: PET/CT 05/27/2023 CLINICAL HISTORY: Abdominal pain, acute, nonlocalized. FINDINGS: LOWER CHEST: Dependent atelectasis in the visualized lung bases. LIVER: Normal size and contour. GALLBLADDER AND BILE DUCTS: No wall thickening. No cholelithiasis. No biliary ductal dilatation. SPLEEN: Normal size. No focal lesion. PANCREAS: No mass. No ductal dilatation. ADRENAL GLANDS: Normal appearance. No mass. KIDNEYS, URETERS AND BLADDER: No stones in the kidneys or ureters. No hydronephrosis. No perinephric or periureteral stranding. Urinary bladder is partially distended with diffuse wall thickening, containing gas bubbles and coarse calcifications in dependent aspect  of the lumen . GI AND BOWEL: Stomach demonstrates no acute abnormality. There is no bowel obstruction. No bowel wall thickening. PERITONEUM AND RETROPERITONEUM: No ascites. No free air. VASCULATURE: 3.1 cm fusiform infrarenal abdominal aortic aneurysm without evidence of leak or impending rupture, terminating above the bifurcation. Coronary and aortic calcified plaque. LYMPH NODES: No lymphadenopathy. REPRODUCTIVE ORGANS: No significant abnormality. BONES AND SOFT TISSUES: Chronic severe T12 vertebral compression deformity. Multilevel spondylotic changes in the lumbar spine with mild levoscoliosis. Sclerotic lesion in the L1 vertebral body as seen previously. IMPRESSION: 1. 3.1 cm fusiform infrarenal abdominal aortic aneurysm. According to the ACR 2013 and SVS 2018 guidelines, the finding is consistent with a small abdominal aortic aneurysm (3.03.4 cm), and the recommendation is surveillance with imaging every 3 years. 2. Diffuse urinary bladder wall thickening with gas bubbles and coarse calcifications in its dependent aspect. Electronically signed by: Katheleen Faes MD 05/26/2024 03:56 PM EDT RP Workstation: [id number]   DG Chest Portable 1 View Result Date: 05/26/2024 CLINICAL DATA:  Shortness of breath. EXAM: PORTABLE CHEST 1 VIEW COMPARISON:  Chest radiograph dated 11/13/2018. FINDINGS: The heart size and mediastinal contours are unchanged. Aortic atherosclerosis. No focal consolidation, pleural effusion, or pneumothorax. Redemonstrated sclerotic lesion in the left posterior seventh rib, which demonstrated radiotracer uptake on the prior PET-CT dated 12/13/2022. IMPRESSION: 1. No acute cardiopulmonary findings. 2. Redemonstrated sclerotic lesion in the left posterior seventh rib, which demonstrated radiotracer uptake on the prior PET-CT dated 12/13/2022, compatible with history of osseous metastasis. Electronically Signed   By: Harrietta Sherry M.D.   On: 05/26/2024 12:44     Discharge  Instructions: Discharge Instructions     Amb Referral to Intravenous Iron  Therapy   Complete by: As directed    You have been referred to [facility] Infusion team for IV Iron  Infusions. The infusion pharmacy team will reach out to you with appointment information.    Primary Diagnosis Code for IV Iron : D50.9 - Iron  deficiency Anemia   Secondary diagnosis code for IV iron : I50.xx - Heart failure related dx codes   Call MD for:  difficulty breathing, headache or visual disturbances   Complete by: As directed    Call MD for:  persistant nausea and vomiting   Complete by: As directed    Call MD for:  temperature >100.4   Complete by: As directed    Diet - low sodium heart healthy   Complete by: As directed    Discharge instructions   Complete by: As directed    Thank you for allowing us  to be part of your care. You were  hospitalized for low blood counts. We treated you with a colonoscopy, an EGD, iron , and fluids.  See the changes in your medications and management of your chronic conditions below:  *For your heart disease -We have STOPPED you on these following medications:  -Lisinopril   -Metoprolol   **Do NOT take these medications unless otherwise instructed by a doctor.   -Make sure to take your time standing up  *For your Radiation Proctitis (The cause of your bloody stools) -We have STARTED you on these following medications:  -Sucralfate 50 mg suppositories-- Place in the rectum once every twelve hours for 14 days.   -Please see your pcp in 7 to 10 days  FOLLOW UP APPOINTMENTS: You have been set up with a new Primary Care Doctor: Quintin PARAS [place] at [facility] An appointment has been scheduled on 08/22/2024 at 1:50 Please call them to schedule a hospital follow up   Please make sure to call your doctor or cardiologist if you develop dizziness or pass out.   Please call your PCP or our clinic if you have any questions or concerns, we may  be able to help and keep you from a long and expensive emergency room wait. Our clinic and after hours phone number is [phone]. The best time to call is Monday through Friday 9 am to 4 pm but there is always someone available 24/7 if you have an emergency. If you need medication refills please notify your pharmacy one week in advance and they will send us  a request.   We are glad you are feeling better,  Schuyler Novak Internal Medicine Inpatient Teaching Service at [facility]   Increase activity slowly   Complete by: As directed    No wound care   Complete by: As directed        Signed: Novak Schuyler, DO 05/30/2024, 1:28 PM

## 2024-05-30 NOTE — Progress Notes (Signed)
 Med details completed for discharge packet.

## 2024-05-30 NOTE — Plan of Care (Signed)
  Problem: Activity: Goal: Risk for activity intolerance will decrease Outcome: Progressing   Problem: Safety: Goal: Ability to remain free from injury will improve Outcome: Progressing   

## 2024-05-30 NOTE — Telephone Encounter (Signed)
 Spoke with pt wife, aware the patient is not taking heart medications because his blood pressure is running too low. Aware his blood pressure is dropping into the 60's when he stands and he can not take medications that may effect his blood pressure. All questions answered to the best of my ability.

## 2024-05-30 NOTE — Progress Notes (Signed)
 Mobility Specialist Progress Note:    05/30/24 0955  Orthostatic Lying   BP- Lying 96/53 (MAP: 66)  Pulse- Lying 80  Orthostatic Sitting  BP- Sitting 94/61 (MAP: 71)  Pulse- Sitting 96  Orthostatic Standing at 3 minutes  BP- Standing at 3 minutes 131/69 (MAP: 89)  Pulse- Standing at 3 minutes 86  Mobility  Activity Ambulated with assistance (In hallway)  Level of Assistance Modified independent, requires aide device or extra time  Assistive Device Front wheel walker  Distance Ambulated (ft) 250 ft  Activity Response Tolerated well  Mobility Referral Yes  Mobility visit 1 Mobility  Mobility Specialist Start Time (ACUTE ONLY) [id number]  Mobility Specialist Stop Time (ACUTE ONLY) [id number]  Mobility Specialist Time Calculation (min) (ACUTE ONLY) 23 min   Received pt in bed and agreeable to mobility. Took orthostatics on pt (see above). No physical assistance needed. No c/o. Returned to room without fault. Left pt in bed with alarm on. Personal belongings and call light within reach. All needs met.  Lavanda Pollack Mobility Specialist  Please contact via [employer] or  Rehab Office [phone]

## 2024-05-31 NOTE — Progress Notes (Addendum)
 [facility] Transitional Care Management     Situation   Ronald Kaiser is a 89 y.o. male who was contacted today for a transitional care outreach.  Admission Date: 05/26/2024  Discharge Date: 05/30/2024   Institution: [facility]  Diagnosis:  Heart failure  Is this visit eligible for TCM? No  Background   Since Discharge: HN very brief phone call with pt. HN refuses f/u with PCP at this time. HN question f/u with [place] PCP and pt states he might have to change PCP. Pt would like for HN to call back at 10 day and end this call for now.   05/31/2024-Pt called HN back. He reports he is still tired and weak. Pt reports he had a colonoscopy in the hospital. He proceeds to tell HN more about his hospital stay. He then tells HN he will be changing PCP to [place]. HN encourages him to go ahead and get that appt scheduled for Sept.   Primary Care Provider on Record: Laneta Tanda Agent, [name]   Assessment    General Assessment     Type of Visit Telephone   Assessment Completed With Patient   Interpreter Used No   Preferred Language English      Flowsheet Row Most Recent Value  Engagement   Call number 1  Two calls made/Contact successful Yes  Is the patient eligible? Yes  Medications   Appointments   Self Management   Patient Teaching   Wrap Up   Two calls made/Contact successful Yes       Recommendation    PCP/specialist notified: Yes  Referral Made: No  Referrals made to other disciplines: None   Future Appointments  Date Time Provider Department Center  01/27/2025  1:00 PM Laneta Tanda Agent [facility] [address]      Augustin Saupe, RN, [employer] [phone]    Electronically signed by: Augustin Saupe, RN 05/31/2024 9:18 AM

## 2024-05-31 NOTE — Telephone Encounter (Signed)
 Ronald Kaiser aware per Ms.Pucket pt refuses to f/u with pcp. Maude states he highly recommended for him to schedule f/u/pos,lpn

## 2024-05-31 NOTE — Telephone Encounter (Signed)
 Copied from CRM #[id number]. Topic: Clinical Concerns - Home Health/Living Facility >> May 31, 2024  3:37 PM Wyline GRADE wrote: MAUDE is calling other request    Include all details related to the request(s) below: Ronald Kaiser WITH [facility] health is calling wanting to know if patient needs to follow up with PCP. Patient was discharged from hospital on 05/30/24 for a GI bleed/anemia and a touch of a-fib. MAUDE will continue with PT for 2 times a week for 4 weeks. Please advise.     Confirm and type the Best Contact Number below:  Patient/caller contact number:   [phone]          [] Home  [x] Mobile  [] Work [] Other   [x] Okay to leave a voicemail   Medication List:  Current Outpatient Medications:  .  aspirin  81 mg EC tablet, Take  by mouth Once Daily., Disp: , Rfl:  .  ferrous sulfate  325 mg (65 mg iron ) EC tablet, Take 325 mg by mouth daily with breakfast., Disp: , Rfl:  .  lisinopriL  (PRINIVIL ) 2.5 mg tablet, Take 1 tablet (2.5 mg total) by mouth daily. Written by cardiology., Disp: , Rfl:  .  metoprolol  tartrate (LOPRESSOR ) 25 mg tablet, 25 mg., Disp: , Rfl:  .  multivitamine, geriatric, Ship broker) tab, Take  by mouth., Disp: , Rfl:  .  nitroglycerin  (NITROSTAT ) 0.4 mg SL tablet, Place  under the tongue., Disp: , Rfl:  .  pravastatin  (PRAVACHOL ) 20 mg tablet, Take 20 mg by mouth Once Daily., Disp: , Rfl:  .  Xtandi  40 mg tablet, , Disp: , Rfl:  No current facility-administered medications for this visit.     Medication Request/Refills: Pharmacy Information (if applicable)   [x] Not Applicable       []  Pharmacy listed  Send Medication Request to:                                                 [] Pharmacy not listed (added to pharmacy list in Epic) Send Medication Request to:      Listed Pharmacies: [id number] - [place], [place] - [address] - PHONE: [phone] - FAX: [phone]

## 2024-06-04 NOTE — Telephone Encounter (Signed)
 He declines to schedule office visit at this time

## 2024-06-06 ENCOUNTER — Inpatient Hospital Stay (HOSPITAL_COMMUNITY)
Admission: EM | Admit: 2024-06-06 | Discharge: 2024-06-10 | DRG: 871 | Disposition: A | Discharge status: Home Health | Attending: Internal Medicine | Admitting: Internal Medicine

## 2024-06-06 ENCOUNTER — Emergency Department (HOSPITAL_COMMUNITY)

## 2024-06-06 ENCOUNTER — Other Ambulatory Visit: Payer: Self-pay

## 2024-06-06 DIAGNOSIS — D638 Anemia in other chronic diseases classified elsewhere: Secondary | ICD-10-CM | POA: Diagnosis present

## 2024-06-06 DIAGNOSIS — A419 Sepsis, unspecified organism: Principal | ICD-10-CM | POA: Diagnosis present

## 2024-06-06 DIAGNOSIS — Z1152 Encounter for screening for COVID-19: Secondary | ICD-10-CM

## 2024-06-06 DIAGNOSIS — I21A1 Myocardial infarction type 2: Secondary | ICD-10-CM | POA: Diagnosis present

## 2024-06-06 DIAGNOSIS — I5022 Chronic systolic (congestive) heart failure: Secondary | ICD-10-CM | POA: Diagnosis present

## 2024-06-06 DIAGNOSIS — S2242XA Multiple fractures of ribs, left side, initial encounter for closed fracture: Secondary | ICD-10-CM | POA: Diagnosis present

## 2024-06-06 DIAGNOSIS — I429 Cardiomyopathy, unspecified: Secondary | ICD-10-CM | POA: Diagnosis present

## 2024-06-06 DIAGNOSIS — S301XXA Contusion of abdominal wall, initial encounter: Secondary | ICD-10-CM | POA: Diagnosis present

## 2024-06-06 DIAGNOSIS — Z7982 Long term (current) use of aspirin: Secondary | ICD-10-CM

## 2024-06-06 DIAGNOSIS — W01198A Fall on same level from slipping, tripping and stumbling with subsequent striking against other object, initial encounter: Secondary | ICD-10-CM | POA: Diagnosis present

## 2024-06-06 DIAGNOSIS — R579 Shock, unspecified: Principal | ICD-10-CM

## 2024-06-06 DIAGNOSIS — S22089A Unspecified fracture of T11-T12 vertebra, initial encounter for closed fracture: Secondary | ICD-10-CM | POA: Diagnosis present

## 2024-06-06 DIAGNOSIS — Z8249 Family history of ischemic heart disease and other diseases of the circulatory system: Secondary | ICD-10-CM

## 2024-06-06 DIAGNOSIS — I2510 Atherosclerotic heart disease of native coronary artery without angina pectoris: Secondary | ICD-10-CM | POA: Diagnosis present

## 2024-06-06 DIAGNOSIS — E785 Hyperlipidemia, unspecified: Secondary | ICD-10-CM | POA: Diagnosis present

## 2024-06-06 DIAGNOSIS — R42 Dizziness and giddiness: Secondary | ICD-10-CM | POA: Diagnosis not present

## 2024-06-06 DIAGNOSIS — N390 Urinary tract infection, site not specified: Secondary | ICD-10-CM | POA: Diagnosis present

## 2024-06-06 DIAGNOSIS — R6521 Severe sepsis with septic shock: Secondary | ICD-10-CM | POA: Diagnosis present

## 2024-06-06 DIAGNOSIS — Z9181 History of falling: Secondary | ICD-10-CM

## 2024-06-06 DIAGNOSIS — R54 Age-related physical debility: Secondary | ICD-10-CM | POA: Diagnosis present

## 2024-06-06 DIAGNOSIS — Z888 Allergy status to other drugs, medicaments and biological substances status: Secondary | ICD-10-CM

## 2024-06-06 DIAGNOSIS — Y842 Radiological procedure and radiotherapy as the cause of abnormal reaction of the patient, or of later complication, without mention of misadventure at the time of the procedure: Secondary | ICD-10-CM | POA: Diagnosis present

## 2024-06-06 DIAGNOSIS — C7951 Secondary malignant neoplasm of bone: Secondary | ICD-10-CM | POA: Diagnosis present

## 2024-06-06 DIAGNOSIS — D509 Iron deficiency anemia, unspecified: Secondary | ICD-10-CM | POA: Diagnosis present

## 2024-06-06 DIAGNOSIS — I110 Hypertensive heart disease with heart failure: Secondary | ICD-10-CM | POA: Diagnosis present

## 2024-06-06 DIAGNOSIS — Z87891 Personal history of nicotine dependence: Secondary | ICD-10-CM

## 2024-06-06 DIAGNOSIS — Z87440 Personal history of urinary (tract) infections: Secondary | ICD-10-CM

## 2024-06-06 DIAGNOSIS — I480 Paroxysmal atrial fibrillation: Secondary | ICD-10-CM | POA: Diagnosis present

## 2024-06-06 DIAGNOSIS — I252 Old myocardial infarction: Secondary | ICD-10-CM

## 2024-06-06 DIAGNOSIS — N210 Calculus in bladder: Secondary | ICD-10-CM | POA: Diagnosis present

## 2024-06-06 DIAGNOSIS — Z9079 Acquired absence of other genital organ(s): Secondary | ICD-10-CM

## 2024-06-06 DIAGNOSIS — Z881 Allergy status to other antibiotic agents status: Secondary | ICD-10-CM

## 2024-06-06 DIAGNOSIS — Y92009 Unspecified place in unspecified non-institutional (private) residence as the place of occurrence of the external cause: Secondary | ICD-10-CM

## 2024-06-06 DIAGNOSIS — M8008XA Age-related osteoporosis with current pathological fracture, vertebra(e), initial encounter for fracture: Secondary | ICD-10-CM | POA: Diagnosis present

## 2024-06-06 DIAGNOSIS — Z8546 Personal history of malignant neoplasm of prostate: Secondary | ICD-10-CM

## 2024-06-06 DIAGNOSIS — K627 Radiation proctitis: Secondary | ICD-10-CM | POA: Diagnosis present

## 2024-06-06 LAB — CBC WITH DIFFERENTIAL/PLATELET
Basophils Absolute: 0 K/uL (ref 0.0–0.1)
Basophils Relative: 0 %
Eosinophils Absolute: 0 K/uL (ref 0.0–0.5)
Eosinophils Relative: 0 %
HCT: 23.2 % — ABNORMAL LOW (ref 39.0–52.0)
Hemoglobin: 7.6 g/dL — ABNORMAL LOW (ref 13.0–17.0)
Lymphocytes Relative: 2 %
Lymphs Abs: 0.2 K/uL — ABNORMAL LOW (ref 0.7–4.0)
MCH: 31.9 pg (ref 26.0–34.0)
MCHC: 32.8 g/dL (ref 30.0–36.0)
MCV: 97.5 fL (ref 80.0–100.0)
Monocytes Absolute: 0.2 K/uL (ref 0.1–1.0)
Monocytes Relative: 2 %
Neutro Abs: 10.3 K/uL — ABNORMAL HIGH (ref 1.7–7.7)
Neutrophils Relative %: 96 %
Platelets: 222 K/uL (ref 150–400)
RBC: 2.38 MIL/uL — ABNORMAL LOW (ref 4.22–5.81)
RDW: 15.8 % — ABNORMAL HIGH (ref 11.5–15.5)
WBC: 10.7 K/uL — ABNORMAL HIGH (ref 4.0–10.5)
nRBC: 0 % (ref 0.0–0.2)

## 2024-06-06 LAB — COMPREHENSIVE METABOLIC PANEL WITH GFR
ALT: 13 U/L (ref 0–44)
AST: 23 U/L (ref 15–41)
Albumin: 1.7 g/dL — ABNORMAL LOW (ref 3.5–5.0)
Alkaline Phosphatase: 83 U/L (ref 38–126)
Anion gap: 10 (ref 5–15)
BUN: 22 mg/dL (ref 8–23)
CO2: 24 mmol/L (ref 22–32)
Calcium: 8 mg/dL — ABNORMAL LOW (ref 8.9–10.3)
Chloride: 98 mmol/L (ref 98–111)
Creatinine, Ser: 1.01 mg/dL (ref 0.61–1.24)
GFR, Estimated: >60 mL/min (ref >60)
Glucose, Bld: 112 mg/dL — ABNORMAL HIGH (ref 70–99)
Potassium: 3.6 mmol/L (ref 3.5–5.1)
Sodium: 132 mmol/L — ABNORMAL LOW (ref 135–145)
Total Bilirubin: 0.5 mg/dL (ref 0.0–1.2)
Total Protein: 5.1 g/dL — ABNORMAL LOW (ref 6.5–8.1)

## 2024-06-06 LAB — I-STAT CG4 LACTIC ACID, ED: Lactic Acid, Venous: 0.9 mmol/L (ref 0.5–1.9)

## 2024-06-06 LAB — PROTIME-INR
INR: 1.2 (ref 0.8–1.2)
Prothrombin Time: 15.4 s — ABNORMAL HIGH (ref 11.4–15.2)

## 2024-06-06 LAB — RESP PANEL BY RT-PCR (RSV, FLU A&B, COVID)  RVPGX2
Influenza A by PCR: NEGATIVE
Influenza B by PCR: NEGATIVE
Resp Syncytial Virus by PCR: NEGATIVE
SARS Coronavirus 2 by RT PCR: NEGATIVE

## 2024-06-06 LAB — TROPONIN I (HIGH SENSITIVITY)
Troponin I (High Sensitivity): 110 ng/L (ref <18)
Troponin I (High Sensitivity): 89 ng/L — ABNORMAL HIGH (ref <18)

## 2024-06-06 MED ORDER — LACTATED RINGERS IV SOLN: INTRAVENOUS | Status: DC

## 2024-06-06 MED ORDER — LACTATED RINGERS IV BOLUS (SEPSIS)
1000.0000 mL | Freq: Once | INTRAVENOUS | Status: AC
  Administered 2024-06-06: 1000 mL via INTRAVENOUS

## 2024-06-06 MED ORDER — LACTATED RINGERS IV BOLUS
500.0000 mL | Freq: Once | INTRAVENOUS | Status: AC
  Administered 2024-06-06: 500 mL via INTRAVENOUS

## 2024-06-06 MED ORDER — SODIUM CHLORIDE 0.9 % IV SOLN
2.0000 g | Freq: Once | INTRAVENOUS | Status: AC
  Administered 2024-06-06: 2 g via INTRAVENOUS
  Filled 2024-06-06: qty 12.5

## 2024-06-06 MED ORDER — IOHEXOL 350 MG/ML SOLN
75.0000 mL | Freq: Once | INTRAVENOUS | Status: AC | PRN
  Administered 2024-06-06: 75 mL via INTRAVENOUS

## 2024-06-06 MED ORDER — VANCOMYCIN HCL IN DEXTROSE 1-5 GM/200ML-% IV SOLN
1000.0000 mg | Freq: Once | INTRAVENOUS | Status: AC
Start: 2024-06-06 — End: 2024-06-06
  Administered 2024-06-06: 1000 mg via INTRAVENOUS
  Filled 2024-06-06: qty 200

## 2024-06-06 NOTE — ED Triage Notes (Signed)
 BIB EMS from home post fall (no LOC - denies hitting his head, just hit his lower back).  Has some lower back discomfort. Malodorous urine noted. Found to be hypotensive with EMS and was recently in hospital for hypotension as well.

## 2024-06-06 NOTE — Sepsis Progress Note (Signed)
Notified provider of need to order fluid bolus and vasopressors.  

## 2024-06-06 NOTE — Sepsis Progress Note (Signed)
 Elink monitoring for the code sepsis protocol.

## 2024-06-06 NOTE — ED Provider Notes (Incomplete)
 [place] [facility] DE[facility]MENT AT [facility] Provider Note HPI Ronald Kaiser is a 89 y.o. male with prostate cancer 2003 c/b metastatic recurrence 2020 s/p TURP 02/2019, CAD s/p multiple MI's c/b ICM with HFrEF (EF 25-30%), HTN who presents to the emergency department for dizziness and low blood pressure.  Patient states that he stood up from his table when he felt dizzy.  He fell back, hitting his back on his counter and then was able to lower himself into a wheelchair.  He did not lose consciousness or hit his head.  He is not on any blood thinners.  He states that his wife called EMS and his blood pressure was noted to be low so they brought him to the hospital.  He has had resolution of his dizziness.  Denies weakness or numbness.  He states that he has been eating and drinking well.  Denies recent infectious symptoms including fevers, chills, cough, vomiting, diarrhea.    Past Medical History:  Diagnosis Date   AKI (acute kidney injury) (HCC) 11/12/2018   CAD (coronary artery disease)    CHF (congestive heart failure) (HCC)    Dyspnea    Foley catheter in place    HTN (hypertension)    Hyperlipidemia    Myocardial infarction (HCC) 2000   REPORTS HE HAS HAD 5 HEART ATTACKS BUT SOME OF THE WERE NOT SERIOUS    Obesity    Prostate cancer [facility])    Past Surgical History:  Procedure Laterality Date   CARDIAC CATHETERIZATION N/A 03/09/2016   Procedure: Left Heart Cath and Coronary Angiography;  Surgeon: Victory LELON Sharps, MD;  Location: [facility] INVASIVE CV LAB;  Service: Cardiovascular;  Laterality: N/A;   COLONOSCOPY N/A 05/28/2024   Procedure: COLONOSCOPY;  Surgeon: Leigh Elspeth SQUIBB, MD;  Location: [facility] ENDOSCOPY;  Service: Gastroenterology;  Laterality: N/A;   CYSTOSCOPY W/ RETROGRADES Bilateral 08/19/2020   Procedure: BILATERAL RETROGRADE PYELOGRAM;  Surgeon: Cam Morene LELON, MD;  Location: [facility] ORS;  Service: Urology;  Laterality: Bilateral;   ESOPHAGOGASTRODUODENOSCOPY N/A  05/28/2024   Procedure: EGD (ESOPHAGOGASTRODUODENOSCOPY);  Surgeon: Leigh Elspeth SQUIBB, MD;  Location: [facility] ENDOSCOPY;  Service: Gastroenterology;  Laterality: N/A;   RADIATION TO PROSTATE     Stenting of LAD  2000   TONSILLECTOMY     TRANSURETHRAL RESECTION OF PROSTATE N/A 03/01/2019   Procedure: TRANSURETHRAL RESECTION OF THE PROSTATE (TURP);  Surgeon: Cam Morene LELON, MD;  Location: [facility] ORS;  Service: Urology;  Laterality: N/A;  75 MINS   TRANSURETHRAL RESECTION OF PROSTATE N/A 08/19/2020   Procedure: TRANSURETHRAL RESECTION OF THE PROSTATE (TURP);  Surgeon: Cam Morene LELON, MD;  Location: [facility] ORS;  Service: Urology;  Laterality: N/A;    Review of Systems Pertinent positives and negative findings are listed as part of the History of Present Illness and MDM  Physical Exam Vitals:   06/07/24 0015 06/07/24 0030 06/07/24 0045 06/07/24 0100  BP: (!) 90/53 (!) 86/54 99/66 (!) 97/58  Pulse: (!) 101 97 (!) 113 98  Resp: 20 (!) 24 (!) 21 20  Temp:      TempSrc:      SpO2: 94% 98% 90% 94%  Weight:      Height:         Constitutional Nursing notes reviewed Vital signs reviewed  HEENT No obvious trauma Pupils round, equal, and reactive to light. Pupils cross midline Neck supple  Respiratory Effort normal Breathing well on room air CTAB  CV Normal rate and rhythm   Abdomen Soft, non-tender,  non-distended No peritonitis  Back No midline C, T or L-spine tenderness to palpation  MSK Atraumatic No obvious deformity ROM appropriate  Neuro Cranial nerves II through XII intact Full symmetric strength in bilateral upper and lower extremities Sensation light touch intact throughout    MDM:  Initial Differential Diagnoses includes septic shock, hypovolemic shock, electrolyte abnormality, cardiogenic presyncope  I reviewed the patient's vitals, the nursing triage note and evaluated the patient at bedside.   I reviewed the patient's external records which show the patient was  discharged in the hospital 8/28 for symptomatic anemia secondary to radiation proctitis and hypotension.  During this admission he was taken off of metoprolol  and lisinopril  due to his hypotension.  During his hospitalization he underwent colonoscopy and EGD on 8/26 which showed severe radiation proctitis.  Patient has a history of A-fib but has declined anticoagulation.  He reiterates this to me.  He is presenting today for a presyncopal event.  He did not lose consciousness.  He hit his thoracic back on the counter behind him but did not fall to the floor or hit his head.  Initial exam shows hypotension with maps in the low 60s, mild tachycardia, and suprapubic fullness.  Patient has a history of recurrent UTIs.  Sepsis order set initiated with blood cultures and antibiotics.  Patient only given a 1 L bolus of fluids due to known CHF and concern for causing acute exacerbation secondary to volume overload.  He did not respond to this bolus and was given additional 500 without improvement.  He was started on low-dose Levophed .  I considered cardiogenic causes of presyncope and reviewed/interpreted the EKG. EKG shows normal sinus rhythm with no evidence of acute ischemic changes or high grade conduction block. There is no STEMI or contiguous T wave inversions.  Patient has occasional premature junctional beats.  The PR, QRS, and QT intervals are normal.   Labs show mild leukocytosis and anemia with a hemoglobin of 7.6 which is about 1 point below his baseline.  No evidence of lactic acidosis.  COVID flu testing negative.  Patient's initial troponin is 110, likely demand ischemia in the setting of hypotension.  Have lower suspicion for ACS in the absence of chest pain and no ischemic changes on EKG.  Trauma imaging shows a compression fracture at T11 along with left-sided 3rd and 4th rib fractures.  Patient admitted to the ICU for presumed septic shock  Procedures: Procedures  Medications administered  in the ED: Medications  lactated ringers  infusion ( Intravenous New Bag/Given 06/06/24 2112)  norepinephrine  (LEVOPHED ) 4mg  in (0.016 mg/mL) premix infusion (4 mcg/min Intravenous Rate/Dose Change 06/07/24 0046)  lactated ringers  bolus 1,000 mL (0 mLs Intravenous Stopped 06/06/24 2113)  vancomycin  (VANCOCIN ) IVPB 1000 mg/200 mL premix (0 mg Intravenous Stopped 06/06/24 2150)  ceFEPIme  (MAXIPIME ) 2 g in sodium chloride  0.9 % 100 mL IVPB (0 g Intravenous Stopped 06/06/24 2050)  iohexol  (OMNIPAQUE ) 350 MG/ML injection 75 mL (75 mLs Intravenous Contrast Given 06/06/24 2221)  lactated ringers  bolus 500 mL (0 mLs Intravenous Stopped 06/07/24 0022)     Impression: 1. Shock [facility])      Patient's presentation is most consistent with acute presentation with potential threat to life or bodily function.  Disposition: ED Disposition:  Admit    ED Discharge Orders     None             Dionisio Blunt, MD 06/07/24 9947    Dionisio Blunt, MD 06/07/24 9880    Emil,  Rolan, DO 06/07/24 1502

## 2024-06-07 ENCOUNTER — Inpatient Hospital Stay (HOSPITAL_COMMUNITY)

## 2024-06-07 ENCOUNTER — Telehealth: Payer: Self-pay | Admitting: Cardiology

## 2024-06-07 DIAGNOSIS — M8008XA Age-related osteoporosis with current pathological fracture, vertebra(e), initial encounter for fracture: Secondary | ICD-10-CM | POA: Diagnosis present

## 2024-06-07 DIAGNOSIS — Z87891 Personal history of nicotine dependence: Secondary | ICD-10-CM | POA: Diagnosis not present

## 2024-06-07 DIAGNOSIS — I5020 Unspecified systolic (congestive) heart failure: Secondary | ICD-10-CM | POA: Diagnosis not present

## 2024-06-07 DIAGNOSIS — I5022 Chronic systolic (congestive) heart failure: Secondary | ICD-10-CM | POA: Diagnosis present

## 2024-06-07 DIAGNOSIS — D638 Anemia in other chronic diseases classified elsewhere: Secondary | ICD-10-CM | POA: Diagnosis present

## 2024-06-07 DIAGNOSIS — I21A1 Myocardial infarction type 2: Secondary | ICD-10-CM | POA: Diagnosis present

## 2024-06-07 DIAGNOSIS — Z8249 Family history of ischemic heart disease and other diseases of the circulatory system: Secondary | ICD-10-CM | POA: Diagnosis not present

## 2024-06-07 DIAGNOSIS — Y842 Radiological procedure and radiotherapy as the cause of abnormal reaction of the patient, or of later complication, without mention of misadventure at the time of the procedure: Secondary | ICD-10-CM | POA: Diagnosis present

## 2024-06-07 DIAGNOSIS — S22089A Unspecified fracture of T11-T12 vertebra, initial encounter for closed fracture: Secondary | ICD-10-CM | POA: Diagnosis present

## 2024-06-07 DIAGNOSIS — I214 Non-ST elevation (NSTEMI) myocardial infarction: Secondary | ICD-10-CM | POA: Diagnosis not present

## 2024-06-07 DIAGNOSIS — Z1152 Encounter for screening for COVID-19: Secondary | ICD-10-CM | POA: Diagnosis not present

## 2024-06-07 DIAGNOSIS — W01198A Fall on same level from slipping, tripping and stumbling with subsequent striking against other object, initial encounter: Secondary | ICD-10-CM | POA: Diagnosis present

## 2024-06-07 DIAGNOSIS — I2510 Atherosclerotic heart disease of native coronary artery without angina pectoris: Secondary | ICD-10-CM | POA: Diagnosis present

## 2024-06-07 DIAGNOSIS — R579 Shock, unspecified: Secondary | ICD-10-CM

## 2024-06-07 DIAGNOSIS — Y92009 Unspecified place in unspecified non-institutional (private) residence as the place of occurrence of the external cause: Secondary | ICD-10-CM | POA: Diagnosis not present

## 2024-06-07 DIAGNOSIS — I480 Paroxysmal atrial fibrillation: Secondary | ICD-10-CM | POA: Diagnosis present

## 2024-06-07 DIAGNOSIS — R6521 Severe sepsis with septic shock: Secondary | ICD-10-CM | POA: Diagnosis present

## 2024-06-07 DIAGNOSIS — Z881 Allergy status to other antibiotic agents status: Secondary | ICD-10-CM | POA: Diagnosis not present

## 2024-06-07 DIAGNOSIS — Z888 Allergy status to other drugs, medicaments and biological substances status: Secondary | ICD-10-CM | POA: Diagnosis not present

## 2024-06-07 DIAGNOSIS — I429 Cardiomyopathy, unspecified: Secondary | ICD-10-CM | POA: Diagnosis present

## 2024-06-07 DIAGNOSIS — C7951 Secondary malignant neoplasm of bone: Secondary | ICD-10-CM | POA: Diagnosis present

## 2024-06-07 DIAGNOSIS — E785 Hyperlipidemia, unspecified: Secondary | ICD-10-CM | POA: Diagnosis present

## 2024-06-07 DIAGNOSIS — Z7982 Long term (current) use of aspirin: Secondary | ICD-10-CM | POA: Diagnosis not present

## 2024-06-07 DIAGNOSIS — R42 Dizziness and giddiness: Secondary | ICD-10-CM

## 2024-06-07 DIAGNOSIS — A419 Sepsis, unspecified organism: Secondary | ICD-10-CM | POA: Diagnosis present

## 2024-06-07 DIAGNOSIS — S2242XA Multiple fractures of ribs, left side, initial encounter for closed fracture: Secondary | ICD-10-CM | POA: Diagnosis present

## 2024-06-07 DIAGNOSIS — I5021 Acute systolic (congestive) heart failure: Secondary | ICD-10-CM

## 2024-06-07 DIAGNOSIS — D509 Iron deficiency anemia, unspecified: Secondary | ICD-10-CM | POA: Diagnosis not present

## 2024-06-07 DIAGNOSIS — I110 Hypertensive heart disease with heart failure: Secondary | ICD-10-CM | POA: Diagnosis present

## 2024-06-07 DIAGNOSIS — Z8546 Personal history of malignant neoplasm of prostate: Secondary | ICD-10-CM | POA: Diagnosis not present

## 2024-06-07 DIAGNOSIS — N390 Urinary tract infection, site not specified: Secondary | ICD-10-CM | POA: Diagnosis present

## 2024-06-07 LAB — URINALYSIS, W/ REFLEX TO CULTURE (INFECTION SUSPECTED)
Bilirubin Urine: NEGATIVE
Glucose, UA: NEGATIVE mg/dL
Ketones, ur: NEGATIVE mg/dL
Nitrite: NEGATIVE
Protein, ur: 100 mg/dL — AB
RBC / HPF: >50 RBC/hpf (ref 0–5)
Specific Gravity, Urine: >1.046 — ABNORMAL HIGH (ref 1.005–1.030)
WBC, UA: >50 WBC/hpf (ref 0–5)
pH: 7 (ref 5.0–8.0)

## 2024-06-07 LAB — CBC
HCT: 23.6 % — ABNORMAL LOW (ref 39.0–52.0)
HCT: 23.2 % — ABNORMAL LOW (ref 39.0–52.0)
Hemoglobin: 7.6 g/dL — ABNORMAL LOW (ref 13.0–17.0)
Hemoglobin: 7.6 g/dL — ABNORMAL LOW (ref 13.0–17.0)
MCH: 31.8 pg (ref 26.0–34.0)
MCH: 31.4 pg (ref 26.0–34.0)
MCHC: 32.2 g/dL (ref 30.0–36.0)
MCHC: 32.8 g/dL (ref 30.0–36.0)
MCV: 98.7 fL (ref 80.0–100.0)
MCV: 95.9 fL (ref 80.0–100.0)
Platelets: 220 K/uL (ref 150–400)
Platelets: 243 K/uL (ref 150–400)
RBC: 2.39 MIL/uL — ABNORMAL LOW (ref 4.22–5.81)
RBC: 2.42 MIL/uL — ABNORMAL LOW (ref 4.22–5.81)
RDW: 15.9 % — ABNORMAL HIGH (ref 11.5–15.5)
RDW: 15.9 % — ABNORMAL HIGH (ref 11.5–15.5)
WBC: 11.6 K/uL — ABNORMAL HIGH (ref 4.0–10.5)
WBC: 13.4 K/uL — ABNORMAL HIGH (ref 4.0–10.5)
nRBC: 0 % (ref 0.0–0.2)
nRBC: 0 % (ref 0.0–0.2)

## 2024-06-07 LAB — BASIC METABOLIC PANEL WITH GFR
Anion gap: 9 (ref 5–15)
BUN: 18 mg/dL (ref 8–23)
CO2: 24 mmol/L (ref 22–32)
Calcium: 7.9 mg/dL — ABNORMAL LOW (ref 8.9–10.3)
Chloride: 100 mmol/L (ref 98–111)
Creatinine, Ser: 0.77 mg/dL (ref 0.61–1.24)
GFR, Estimated: >60 mL/min (ref >60)
Glucose, Bld: 129 mg/dL — ABNORMAL HIGH (ref 70–99)
Potassium: 3.5 mmol/L (ref 3.5–5.1)
Sodium: 133 mmol/L — ABNORMAL LOW (ref 135–145)

## 2024-06-07 LAB — PREPARE RBC (CROSSMATCH)

## 2024-06-07 LAB — ECHOCARDIOGRAM LIMITED
Height: 71 in
Single Plane A4C EF: 10.7 %
Weight: 2275.15 [oz_av]

## 2024-06-07 LAB — HEMOGLOBIN AND HEMATOCRIT, BLOOD
HCT: 25 % — ABNORMAL LOW (ref 39.0–52.0)
HCT: 25.6 % — ABNORMAL LOW (ref 39.0–52.0)
HCT: 23.7 % — ABNORMAL LOW (ref 39.0–52.0)
Hemoglobin: 8.3 g/dL — ABNORMAL LOW (ref 13.0–17.0)
Hemoglobin: 8.2 g/dL — ABNORMAL LOW (ref 13.0–17.0)
Hemoglobin: 7.6 g/dL — ABNORMAL LOW (ref 13.0–17.0)

## 2024-06-07 LAB — MAGNESIUM: Magnesium: 1.6 mg/dL — ABNORMAL LOW (ref 1.7–2.4)

## 2024-06-07 LAB — ABO/RH: ABO/RH(D): A POS

## 2024-06-07 LAB — MRSA NEXT GEN BY PCR, NASAL: MRSA by PCR Next Gen: NOT DETECTED

## 2024-06-07 LAB — TROPONIN I (HIGH SENSITIVITY)
Troponin I (High Sensitivity): 88 ng/L — ABNORMAL HIGH (ref <18)
Troponin I (High Sensitivity): 109 ng/L (ref <18)

## 2024-06-07 MED ORDER — PRAVASTATIN SODIUM 10 MG PO TABS
20.0000 mg | ORAL_TABLET | Freq: Every evening | ORAL | Status: DC
  Administered 2024-06-07 – 2024-06-09 (×3): 20 mg via ORAL
  Filled 2024-06-07 (×3): qty 2

## 2024-06-07 MED ORDER — POTASSIUM CHLORIDE CRYS ER 20 MEQ PO TBCR
40.0000 meq | EXTENDED_RELEASE_TABLET | Freq: Once | ORAL | Status: AC
  Administered 2024-06-07: 40 meq via ORAL
  Filled 2024-06-07: qty 2

## 2024-06-07 MED ORDER — CHLORHEXIDINE GLUCONATE CLOTH 2 % EX PADS
6.0000 | MEDICATED_PAD | Freq: Every day | CUTANEOUS | Status: DC
  Administered 2024-06-07 – 2024-06-10 (×5): 6 via TOPICAL

## 2024-06-07 MED ORDER — SODIUM CHLORIDE 0.9 % IV SOLN
1.0000 g | Freq: Two times a day (BID) | INTRAVENOUS | Status: DC
  Filled 2024-06-07 (×2): qty 10

## 2024-06-07 MED ORDER — HEPARIN SODIUM (PORCINE) 5000 UNIT/ML IJ SOLN
5000.0000 [IU] | Freq: Three times a day (TID) | INTRAMUSCULAR | Status: DC
  Administered 2024-06-07: 5000 [IU] via SUBCUTANEOUS
  Filled 2024-06-07: qty 1

## 2024-06-07 MED ORDER — POLYETHYLENE GLYCOL 3350 17 G PO PACK: 17.0000 g | PACK | Freq: Every day | ORAL | Status: DC | PRN

## 2024-06-07 MED ORDER — VANCOMYCIN HCL 1250 MG/250ML IV SOLN
1250.0000 mg | INTRAVENOUS | Status: DC
  Administered 2024-06-07: 1250 mg via INTRAVENOUS
  Filled 2024-06-07: qty 250

## 2024-06-07 MED ORDER — ACETAMINOPHEN 325 MG PO TABS: 650.0000 mg | ORAL_TABLET | Freq: Four times a day (QID) | ORAL | Status: DC | PRN

## 2024-06-07 MED ORDER — MAGNESIUM SULFATE 2 GM/50ML IV SOLN
2.0000 g | Freq: Once | INTRAVENOUS | Status: AC
  Administered 2024-06-07: 2 g via INTRAVENOUS
  Filled 2024-06-07: qty 50

## 2024-06-07 MED ORDER — PERFLUTREN LIPID MICROSPHERE
1.0000 mL | INTRAVENOUS | Status: AC | PRN
  Administered 2024-06-07: 2 mL via INTRAVENOUS

## 2024-06-07 MED ORDER — ORAL CARE MOUTH RINSE: 15.0000 mL | OROMUCOSAL | Status: DC | PRN

## 2024-06-07 MED ORDER — SODIUM CHLORIDE 0.9% IV SOLUTION: Freq: Once | INTRAVENOUS | Status: DC

## 2024-06-07 MED ORDER — ASPIRIN 81 MG PO TBEC
81.0000 mg | DELAYED_RELEASE_TABLET | Freq: Every day | ORAL | Status: DC
  Administered 2024-06-07 – 2024-06-10 (×4): 81 mg via ORAL
  Filled 2024-06-07 (×4): qty 1

## 2024-06-07 MED ORDER — NOREPINEPHRINE 4 MG/250ML-% IV SOLN
0.0000 ug/min | INTRAVENOUS | Status: DC
  Administered 2024-06-07: 8 ug/min via INTRAVENOUS
  Administered 2024-06-07 – 2024-06-08 (×2): 2 ug/min via INTRAVENOUS
  Filled 2024-06-07 (×3): qty 250

## 2024-06-07 MED ORDER — SODIUM CHLORIDE 0.9 % IV SOLN
2.0000 g | Freq: Three times a day (TID) | INTRAVENOUS | Status: DC
  Administered 2024-06-07 – 2024-06-10 (×10): 2 g via INTRAVENOUS
  Filled 2024-06-07 (×10): qty 12.5

## 2024-06-07 MED ORDER — DOCUSATE SODIUM 100 MG PO CAPS: 100.0000 mg | ORAL_CAPSULE | Freq: Two times a day (BID) | ORAL | Status: DC | PRN

## 2024-06-07 NOTE — Progress Notes (Signed)
 Pharmacy Antibiotic Note  Ronald Kaiser is a 89 y.o. male admitted on 06/06/2024 with sepsis, possible recurrent UTI. CT with pelvic left wall rectus muscle phlegmon, abscess or hematoma, cystitis and likely proctitis. Pharmacy has been consulted for cefepime , vancomycin  dosing.  9/5 Vancomycin  1250mg  Q24 hr with Est AUC: 435 Scr used: 0.8 mg/dL; Vd coeff: 0.72 L/kg  Plan:  Cefepime  1g q12h Vancomycin  1250mg  q24hr  Monitor cultures, clinical status, renal function, vancomycin  level Narrow abx as able and f/u duration    Height: 5' 11 (180.3 cm) Weight: 64.5 kg (142 lb 3.2 oz) IBW/kg (Calculated) : 75.3  Temp (24hrs), Avg:98.9 F (37.2 C), Min:97.7 F (36.5 C), Max:99.5 F (37.5 C)  Recent Labs  Lab 06/06/24 2020 06/06/24 2037 06/07/24 0231 06/07/24 0544  WBC 10.7*  --  11.6* 13.4*  CREATININE 1.01  --  0.77  --   LATICACIDVEN  --  0.9  --   --     Estimated Creatinine Clearance: 59.3 mL/min (by C-G formula based on SCr of 0.77 mg/dL).    Allergies  Allergen Reactions   Bactrim [Sulfamethoxazole-Trimethoprim] Hives, Shortness Of Breath and Swelling   Amoxicillin-Pot Clavulanate Diarrhea   Crestor  [Rosuvastatin  Calcium ] Other (See Comments)    Back pain,grogginess and dizziness   Zetia  [Ezetimibe ] Other (See Comments)    Back pain, grogginess and dizziness    Antimicrobials this admission: Cefe 9/4 >>  Vanc 9/4 >>    Dose adjustments this admission: N/a  Microbiology results: 9/4 BCx:  9/4 UCx:    Thank you for allowing pharmacy to be a part of this patient's care.  Jinnie Door, PharmD, BCPS, BCCP Clinical Pharmacist  Please check AMION for all [facility] Pharmacy phone numbers After 10:00 PM, call Main Pharmacy [phone]

## 2024-06-07 NOTE — Consult Note (Signed)
 Reason for Consult:Thoracic compression fracture Referring Physician: Dr. Theodoro Theadore Ronald Kaiser is an 89 y.o. male.  HPI: I was consulted on this patient due to a compression fracture.  The patient states that he did sustain a fall 2 weeks ago, and also a month ago.  He did not think much of the falls  from the standpoint of any trauma on his back, but he denies any other significant trauma.  He has no pain in his right or left legs.  He does have a slight ache in the low back, but nothing substantial. His current pain level is about a 1 out of 10. He describes the pain as an aching sensation.  Past Medical History:  Diagnosis Date   AKI (acute kidney injury) (HCC) 11/12/2018   CAD (coronary artery disease)    CHF (congestive heart failure) (HCC)    Dyspnea    Foley catheter in place    HTN (hypertension)    Hyperlipidemia    Myocardial infarction (HCC) 2000   REPORTS HE HAS HAD 5 HEART ATTACKS BUT SOME OF THE WERE NOT SERIOUS    Obesity    Prostate cancer [facility])     Past Surgical History:  Procedure Laterality Date   CARDIAC CATHETERIZATION N/A 03/09/2016   Procedure: Left Heart Cath and Coronary Angiography;  Surgeon: Victory LELON Sharps, MD;  Location: [facility] INVASIVE CV LAB;  Service: Cardiovascular;  Laterality: N/A;   COLONOSCOPY N/A 05/28/2024   Procedure: COLONOSCOPY;  Surgeon: Leigh Elspeth SQUIBB, MD;  Location: [facility] ENDOSCOPY;  Service: Gastroenterology;  Laterality: N/A;   CYSTOSCOPY W/ RETROGRADES Bilateral 08/19/2020   Procedure: BILATERAL RETROGRADE PYELOGRAM;  Surgeon: Cam Morene LELON, MD;  Location: [facility] ORS;  Service: Urology;  Laterality: Bilateral;   ESOPHAGOGASTRODUODENOSCOPY N/A 05/28/2024   Procedure: EGD (ESOPHAGOGASTRODUODENOSCOPY);  Surgeon: Leigh Elspeth SQUIBB, MD;  Location: [facility] ENDOSCOPY;  Service: Gastroenterology;  Laterality: N/A;   RADIATION TO PROSTATE     Stenting of LAD  2000   TONSILLECTOMY     TRANSURETHRAL RESECTION OF PROSTATE N/A 03/01/2019   Procedure:  TRANSURETHRAL RESECTION OF THE PROSTATE (TURP);  Surgeon: Cam Morene LELON, MD;  Location: [facility] ORS;  Service: Urology;  Laterality: N/A;  75 MINS   TRANSURETHRAL RESECTION OF PROSTATE N/A 08/19/2020   Procedure: TRANSURETHRAL RESECTION OF THE PROSTATE (TURP);  Surgeon: Cam Morene LELON, MD;  Location: [facility] ORS;  Service: Urology;  Laterality: N/A;    Family History  Problem Relation Age of Onset   Hypertension Father    Breast cancer Neg Hx    Prostate cancer Neg Hx    Colon cancer Neg Hx    Pancreatic cancer Neg Hx     Social History:  reports that he quit smoking about 61 years ago. His smoking use included cigarettes. He started smoking about 68 years ago. He has a 3.5 pack-year smoking history. He has never used smokeless tobacco. He reports current alcohol use. He reports that he does not currently use drugs.  Allergies:  Allergies  Allergen Reactions   Bactrim [Sulfamethoxazole-Trimethoprim] Hives, Shortness Of Breath and Swelling   Augmentin [Amoxicillin-Pot Clavulanate] Diarrhea   Crestor  [Rosuvastatin  Calcium ] Other (See Comments)    Back pain Grogginess  Dizziness    Zetia  [Ezetimibe ] Other (See Comments)    Back pain Grogginess  Dizziness     Medications: I have reviewed the patient's current medications.  Results for orders placed or performed during the hospital encounter of 06/06/24 (from the past 48 hours)  Blood Culture (  routine x 2)     Status: None (Preliminary result)   Collection Time: 06/06/24  8:10 PM   Specimen: BLOOD  Result Value Ref Range   Specimen Description BLOOD RIGHT ANTECUBITAL    Special Requests      BOTTLES DRAWN AEROBIC AND ANAEROBIC Blood Culture adequate volume   Culture      NO GROWTH < 12 HOURS Performed at [facility], [address], [place], [place] [address]    Report Status PENDING   Blood Culture (routine x 2)     Status: None (Preliminary result)   Collection Time: 06/06/24  8:15 PM   Specimen: BLOOD RIGHT  FOREARM  Result Value Ref Range   Specimen Description BLOOD RIGHT FOREARM    Special Requests      BOTTLES DRAWN AEROBIC AND ANAEROBIC Blood Culture adequate volume   Culture      NO GROWTH < 12 HOURS Performed at [facility], [address], [place], [place] [address]    Report Status PENDING   ABO/Rh     Status: None   Collection Time: 06/06/24  8:19 PM  Result Value Ref Range   ABO/RH(D)      A POS Performed at [facility], [address] [place], [place] [address]   Resp panel by RT-PCR (RSV, Flu A&B, Covid) Anterior Nasal Swab     Status: None   Collection Time: 06/06/24  8:20 PM   Specimen: Anterior Nasal Swab  Result Value Ref Range   SARS Coronavirus 2 by RT PCR NEGATIVE NEGATIVE   Influenza A by PCR NEGATIVE NEGATIVE   Influenza B by PCR NEGATIVE NEGATIVE    Comment: (NOTE) The Xpert Xpress SARS-CoV-2/FLU/RSV plus assay is intended as an aid in the diagnosis of influenza from Nasopharyngeal swab specimens and should not be used as a sole basis for treatment. Nasal washings and aspirates are unacceptable for Xpert Xpress SARS-CoV-2/FLU/RSV testing.  Fact Sheet for Patients: BloggerCourse.com  Fact Sheet for Healthcare Providers: SeriousBroker.it  This test is not yet approved or cleared by the [place]  FDA and has been authorized for detection and/or diagnosis of SARS-CoV-2 by FDA under an Emergency Use Authorization (EUA). This EUA will remain in effect (meaning this test can be used) for the duration of the COVID-19 declaration under Section 564(b)(1) of the Act, 21 U.S.C. section 360bbb-3(b)(1), unless the authorization is terminated or revoked.     Resp Syncytial Virus by PCR NEGATIVE NEGATIVE    Comment: (NOTE) Fact Sheet for Patients: BloggerCourse.com  Fact Sheet for Healthcare Providers: SeriousBroker.it  This test is not yet  approved or cleared by the [place]  FDA and has been authorized for detection and/or diagnosis of SARS-CoV-2 by FDA under an Emergency Use Authorization (EUA). This EUA will remain in effect (meaning this test can be used) for the duration of the COVID-19 declaration under Section 564(b)(1) of the Act, 21 U.S.C. section 360bbb-3(b)(1), unless the authorization is terminated or revoked.  Performed at [facility], [address] [place], [place] [address]   Comprehensive metabolic panel     Status: Abnormal   Collection Time: 06/06/24  8:20 PM  Result Value Ref Range   Sodium 132 (L) 135 - 145 mmol/L   Potassium 3.6 3.5 - 5.1 mmol/L   Chloride 98 98 - 111 mmol/L   CO2 24 22 - 32 mmol/L   Glucose, Bld 112 (H) 70 - 99 mg/dL    Comment: Glucose reference  range applies only to samples taken after fasting for at least 8 hours.   BUN 22 8 - 23 mg/dL   Creatinine, Ser 8.98 0.61 - 1.24 mg/dL   Calcium  8.0 (L) 8.9 - 10.3 mg/dL   Total Protein 5.1 (L) 6.5 - 8.1 g/dL   Albumin 1.7 (L) 3.5 - 5.0 g/dL   AST 23 15 - 41 U/L   ALT 13 0 - 44 U/L   Alkaline Phosphatase 83 38 - 126 U/L   Total Bilirubin 0.5 0.0 - 1.2 mg/dL   GFR, Estimated >39 >39 mL/min    Comment: (NOTE) Calculated using the CKD-EPI Creatinine Equation (2021)    Anion gap 10 5 - 15    Comment: Performed at [facility], [address] [place], [place] [address]  CBC with Differential     Status: Abnormal   Collection Time: 06/06/24  8:20 PM  Result Value Ref Range   WBC 10.7 (H) 4.0 - 10.5 K/uL   RBC 2.38 (L) 4.22 - 5.81 MIL/uL   Hemoglobin 7.6 (L) 13.0 - 17.0 g/dL   HCT 76.7 (L) 60.9 - 47.9 %   MCV 97.5 80.0 - 100.0 fL   MCH 31.9 26.0 - 34.0 pg   MCHC 32.8 30.0 - 36.0 g/dL   RDW 84.1 (H) 88.4 - 84.4 %   Platelets 222 150 - 400 K/uL   nRBC 0.0 0.0 - 0.2 %   Neutrophils Relative % 96 %   Neutro Abs 10.3 (H) 1.7 - 7.7 K/uL   Lymphocytes Relative 2 %   Lymphs Abs 0.2 (L) 0.7 - 4.0 K/uL   Monocytes  Relative 2 %   Monocytes Absolute 0.2 0.1 - 1.0 K/uL   Eosinophils Relative 0 %   Eosinophils Absolute 0.0 0.0 - 0.5 K/uL   Basophils Relative 0 %   Basophils Absolute 0.0 0.0 - 0.1 K/uL   WBC Morphology See Note     Comment:  Increased Bands. >20% Bands   RBC Morphology MORPHOLOGY UNREMARKABLE    Smear Review See Note     Comment:  Normal Platelet Morphology Performed at [facility], [address] [place], [place] [address]   Protime-INR     Status: Abnormal   Collection Time: 06/06/24  8:20 PM  Result Value Ref Range   Prothrombin Time 15.4 (H) 11.4 - 15.2 seconds   INR 1.2 0.8 - 1.2    Comment: (NOTE) INR goal varies based on device and disease states. Performed at [facility], [address], [place], [place] [address]   Urinalysis, w/ Reflex to Culture (Infection Suspected) -Urine, Clean Catch     Status: Abnormal   Collection Time: 06/06/24  8:20 PM  Result Value Ref Range   Specimen Source URINE, CLEAN CATCH    Color, Urine YELLOW YELLOW   APPearance CLOUDY (A) CLEAR   Specific Gravity, Urine >1.046 (H) 1.005 - 1.030   pH 7.0 5.0 - 8.0   Glucose, UA NEGATIVE NEGATIVE mg/dL   Hgb urine dipstick MODERATE (A) NEGATIVE   Bilirubin Urine NEGATIVE NEGATIVE   Ketones, ur NEGATIVE NEGATIVE mg/dL   Protein, ur 899 (A) NEGATIVE mg/dL   Nitrite NEGATIVE NEGATIVE   Leukocytes,Ua SMALL (A) NEGATIVE   RBC / HPF >50 0 - 5 RBC/hpf   WBC, UA >50 0 - 5 WBC/hpf    Comment:        Reflex urine culture not performed if WBC <=10, OR if Squamous epithelial cells >5.  If Squamous epithelial cells >5 suggest recollection.    Bacteria, UA RARE (A) NONE SEEN   Squamous Epithelial / HPF 0-5 0 - 5 /HPF   Mucus PRESENT     Comment: Performed at [facility], [address], [place], [place] [address]  Troponin I (High Sensitivity)     Status: Abnormal   Collection Time: 06/06/24  8:20 PM  Result Value Ref Range   Troponin I (High Sensitivity) 110 (HH) <18 ng/L     Comment: CRITICAL RESULT CALLED TO, READ BACK BY AND VERIFIED WITH TURNER D, RN 2127 06/06/2024 SANDOVAL K (NOTE) Elevated high sensitivity troponin I (hsTnI) values and significant  changes across serial measurements may suggest ACS but many other  chronic and acute conditions are known to elevate hsTnI results.  Refer to the Links section for chest pain algorithms and additional  guidance. Performed at [facility], [address] [place], [place] [address]   I-Stat Lactic Acid, ED     Status: None   Collection Time: 06/06/24  8:37 PM  Result Value Ref Range   Lactic Acid, Venous 0.9 0.5 - 1.9 mmol/L  Troponin I (High Sensitivity)     Status: Abnormal   Collection Time: 06/06/24 10:42 PM  Result Value Ref Range   Troponin I (High Sensitivity) 89 (H) <18 ng/L    Comment: (NOTE) Elevated high sensitivity troponin I (hsTnI) values and significant  changes across serial measurements may suggest ACS but many other  chronic and acute conditions are known to elevate hsTnI results.  Refer to the Links section for chest pain algorithms and additional  guidance. Performed at [facility], [address] [place], [place] [address]   Type and screen If need to transfuse blood products please use the blood administration order set     Status: None (Preliminary result)   Collection Time: 06/07/24  2:29 AM  Result Value Ref Range   ABO/RH(D) A POS    Antibody Screen NEG    Sample Expiration      06/10/2024,2359 Performed at [facility], [address], [place], [place] [address]    Unit Number [id number]    Blood Component Type RED CELLS,LR    Unit division 00    Status of Unit ALLOCATED    Transfusion Status OK TO TRANSFUSE    Crossmatch Result Compatible   Troponin I (High Sensitivity)     Status: Abnormal   Collection Time: 06/07/24  2:29 AM  Result Value Ref Range   Troponin I (High Sensitivity) 88 (H) <18 ng/L    Comment: (NOTE) Elevated high  sensitivity troponin I (hsTnI) values and significant  changes across serial measurements may suggest ACS but many other  chronic and acute conditions are known to elevate hsTnI results.  Refer to the Links section for chest pain algorithms and additional  guidance. Performed at [facility], [address], [place], [place] [address]   CBC     Status: Abnormal   Collection Time: 06/07/24  2:31 AM  Result Value Ref Range   WBC 11.6 (H) 4.0 - 10.5 K/uL   RBC 2.39 (L) 4.22 - 5.81 MIL/uL   Hemoglobin 7.6 (L) 13.0 - 17.0 g/dL   HCT 76.3 (L) 60.9 - 47.9 %   MCV 98.7 80.0 - 100.0 fL   MCH 31.8 26.0 - 34.0 pg   MCHC 32.2 30.0 - 36.0 g/dL   RDW 84.0 (H) 88.4 - 84.4 %  Platelets 220 150 - 400 K/uL   nRBC 0.0 0.0 - 0.2 %    Comment: Performed at [facility], [address], [place], [place] [address]  Basic metabolic panel     Status: Abnormal   Collection Time: 06/07/24  2:31 AM  Result Value Ref Range   Sodium 133 (L) 135 - 145 mmol/L   Potassium 3.5 3.5 - 5.1 mmol/L   Chloride 100 98 - 111 mmol/L   CO2 24 22 - 32 mmol/L   Glucose, Bld 129 (H) 70 - 99 mg/dL    Comment: Glucose reference range applies only to samples taken after fasting for at least 8 hours.   BUN 18 8 - 23 mg/dL   Creatinine, Ser 9.22 0.61 - 1.24 mg/dL   Calcium  7.9 (L) 8.9 - 10.3 mg/dL   GFR, Estimated >39 >39 mL/min    Comment: (NOTE) Calculated using the CKD-EPI Creatinine Equation (2021)    Anion gap 9 5 - 15    Comment: Performed at [facility], [address], [place], [place] [address]  Magnesium      Status: Abnormal   Collection Time: 06/07/24  2:31 AM  Result Value Ref Range   Magnesium  1.6 (L) 1.7 - 2.4 mg/dL    Comment: Performed at [facility], [address] [place], [place] [address]  MRSA Next Gen by PCR, Nasal     Status: None   Collection Time: 06/07/24  4:43 AM   Specimen: Nasal Mucosa; Nasal Swab  Result Value Ref Range   MRSA by PCR Next Gen NOT DETECTED NOT  DETECTED    Comment: (NOTE) The GeneXpert MRSA Assay (FDA approved for NASAL specimens only), is one component of a comprehensive MRSA colonization surveillance program. It is not intended to diagnose MRSA infection nor to guide or monitor treatment for MRSA infections. Test performance is not FDA approved in patients less than 54 years old. Performed at [facility], [address], [place], [place] [address]   CBC     Status: Abnormal   Collection Time: 06/07/24  5:44 AM  Result Value Ref Range   WBC 13.4 (H) 4.0 - 10.5 K/uL   RBC 2.42 (L) 4.22 - 5.81 MIL/uL   Hemoglobin 7.6 (L) 13.0 - 17.0 g/dL   HCT 76.7 (L) 60.9 - 47.9 %   MCV 95.9 80.0 - 100.0 fL   MCH 31.4 26.0 - 34.0 pg   MCHC 32.8 30.0 - 36.0 g/dL   RDW 84.0 (H) 88.4 - 84.4 %   Platelets 243 150 - 400 K/uL   nRBC 0.0 0.0 - 0.2 %    Comment: Performed at [facility], [address], [place], [place] [address]  Troponin I (High Sensitivity)     Status: Abnormal   Collection Time: 06/07/24  5:44 AM  Result Value Ref Range   Troponin I (High Sensitivity) 109 (HH) <18 ng/L    Comment: CRITICAL RESULT CALLED TO, READ BACK BY AND VERIFIED WITH ELOY PARAS, RN [phone] 06/07/2024 SANDOVAL K (NOTE) Elevated high sensitivity troponin I (hsTnI) values and significant  changes across serial measurements may suggest ACS but many other  chronic and acute conditions are known to elevate hsTnI results.  Refer to the Links section for chest pain algorithms and additional  guidance. Performed at [facility], [address], [place], [place] [address]   Prepare RBC (crossmatch)     Status: None   Collection Time:  06/07/24  6:48 AM  Result Value Ref Range   Order Confirmation      ORDER PROCESSED BY BLOOD BANK Performed at [facility], [address], [place], [place] [address]   Hemoglobin and hematocrit, blood     Status: Abnormal   Collection Time: 06/07/24  7:37 AM  Result Value Ref Range   Hemoglobin  8.3 (L) 13.0 - 17.0 g/dL   HCT 74.9 (L) 60.9 - 47.9 %    Comment: Performed at [facility] Lab, [address] [place], [place] [address]    CT CHEST ABDOMEN PELVIS W CONTRAST Result Date: 06/06/2024 CLINICAL DATA:  Clemens at home, with low back pain and low back impact. Hypotensive on evaluation by EMS at the scene. Malodorous urine also noted. EXAM: CT CHEST, ABDOMEN, AND PELVIS WITH CONTRAST CT THORACIC SPINE WITHOUT CONTRAST CT LUMBAR SPINE WITHOUT CONTRAST TECHNIQUE: Multidetector CT imaging of the chest, abdomen and pelvis was performed following the standard protocol during bolus administration of intravenous contrast. Multi detector CT imaging of the thoracic and lumbar spine was performed following the standard protocol without additional administration of contrast. Multiplanar CT image reconstructions were created and reviewed. RADIATION DOSE REDUCTION: This exam was performed according to the departmental dose-optimization program which includes automated exposure control, adjustment of the mA and/or kV according to patient size and/or use of iterative reconstruction technique. CONTRAST:  75mL OMNIPAQUE  IOHEXOL  350 MG/ML SOLN COMPARISON:  Most recent is CT scan abdomen pelvis and reconstructions 05/26/2024, before that chest CT with IV contrast 03/04/2022. Also compared with CT abdomen and pelvis no contrast 07/30/2020. FINDINGS: CT CHEST FINDINGS Cardiovascular: Normal heart size. No pericardial effusion. Left main and three-vessel heavy calcific CAD again noted and LAD stenting. The aorta is tortuous with moderate to heavy calcific plaques without aneurysm, stenosis or dissection. There are mild calcific plaques in the great vessels without stenosis. Pulmonary arteries and veins are normal caliber. Mediastinum/Nodes: No adenopathy, mediastinal free air or fluid collections. Negative axillary spaces, thyroid gland. Tracheal ectasia without filling defect. Negative esophagus. Lungs/Pleura: There is  posterior atelectasis. Scattered linear scar-like opacities in the bases and mild reticular biapical scarring. No consolidation, effusion or pneumothorax. Clear central airways. No appreciable nodules. Musculoskeletal: Remote severe T12 compression fracture with retropulsion. Acute fracture of T11 will be described below. There are acute nondisplaced fractures of the left anterolateral third and fourth ribs. There is increased osseous sclerosis in the posterolateral left seventh rib and in the anterolateral left third rib and also in the posterior right eighth rib, concern for blastic metastases. There is additional new sclerosis likely due to metastasis in the spinous processes of T1 and T2. There is a sclerotic lesion in the T10 vertebral body new from 2 years ago also concerning for metastasis, another is seen in the L1 body measuring 1.2 cm also new and not seen in 2021 either. CT ABDOMEN PELVIS FINDINGS Hepatobiliary: No hepatic injury or perihepatic hematoma. Gallbladder is unremarkable. No liver mass enhancement. Pancreas: No abnormality. Spleen: No abnormality. Adrenals/Urinary Tract: Adrenal glands are unremarkable. Kidneys are normal, without renal calculi, focal lesion, or hydronephrosis. There is mild generalized thickening of the bladder wall with multiple stones inferiorly, largest is 2.3 cm. Correlate clinically for cystitis. Stomach/Bowel: Unremarkable contracted appearance of the stomach. No small bowel obstruction or inflammation. An appendix is not seen. Uncomplicated sigmoid diverticulosis. The colon wall is normal in thickness until the rectum, which has become thickened since 05/26/2024 most likely due to proctitis. There is  only a small amount of stool in the rectum. There is perirectal stranding drainage. Vascular/Lymphatic: Heavy aortoiliac calcific plaques. 3.0 cm fusiform infrarenal AAA. No dissection. No lymphadenopathy. Reproductive: He has had prior TURP procedure x2, most recently in  2021. No masses seen in prostate bed. Prostate XRT reportedly was also administered but there are no brachytherapy seeds. Other: Trace deep pelvic ascites. No free air. No incarcerated hernia. Musculoskeletal: Levoscoliosis and degenerative change lumbar spine with osteopenia. Sclerosis left ischial tuberosity consistent with metastatic disease. Small chronic sclerotic lesions right hemisacrum most likely bone islands. Within the low anterior pelvic left rectus muscle there is a rim enhancing hypodense collection, 1.9 x 1.6 cm on 4:106, 5.9 cm in height on 7:70, consistent with phlegmon, viscous abscess or hematoma. CT THORACIC SPINE WITHOUT CONTRAST Segmentation: Standard, with 12 rib-bearing thoracic type segments. Alignment: Chronic retropulsion and retrolisthesis at T12. Trace retrolisthesis of T11 also chronic. Vertebrae: Acute transverse oblique fracture extends through the T11 vertebral body from anterior to posterior, with slight posterosuperior cortical buckling. Loss of anterior vertebral height is 10-15%, with about 5% loss of the posterior, with slight retropulsion at the level of the posterosuperior cortical buckling. No apparent involvement of the pedicles and posterior elements. No epidural hematoma is visible. There is a chronic severe compression fracture of the T12 vertebral body, with a severe retropulsion to the left. This was first seen on the PET-CT from 12/13/2022. There is severe effacement of the left hemicanal at T12, displacement of the distal cord over to the right and at least borderline compression of the distal cord. Other levels do not show significant bony or soft tissue encroachment on the thecal sac. No herniated discs are seen. Sclerotic lesions most likely due to metastases are noted in the T1 and T2 spinous processes and T10 vertebral body. No other fractures are seen. Above T11 the vertebrae are normal in height. Paraspinal abdominal soft tissues: Paraspinal edema noted at  T10-11 without paraspinal hematoma or fluid collection. Disc levels: The discs are degenerated with no apparent herniation. No spinal canal hematoma is seen. There is mild spondylosis. High-grade narrowing particularly in the left hemicanal as above at T12 due to T12 retropulsion, chronic. There are slight facet spurs of the lower thoracic levels but no levels demonstrate critical foraminal stenosis. Other: None. CT LUMBAR SPINE WITHOUT CONTRAST Segmentation:Standard. Alignment:Mild levoscoliosis apex L4.  No AP listhesis. Vertebrae: No evidence of fractures. Sclerotic lesions compatible with metastases are noted with 2 in the L1 body. Osteopenia. Paraspinal and other soft tissues:No hematoma, mass or fluid collection. Disc levels:Only the L1-2 disc is normal in height. Other discs are variably collapsed, greatest disc space loss at L4-5 and L5-S1. There is lumbar spondylosis. Moderate acquired spinal stenosis L4-5 due to posterior disc osteophyte complex and posterior element hypertrophy. The spinous processes abut with opposing surface spurring L2-5, consistent with Baastrup's disease. No other notable thecal sac encroachment is seen. There are mild facet spurs at L3-4 and L5-S1 and moderate facet hypertrophy right greater than left L4-5. No critical foraminal stenosis. Other: The SI joints are patent. No sacral insufficiency fracture. Osteopenia. Linear sclerosis in the posterior right ilium adjacent the SI joints compatible with a healed fracture. IMPRESSION: 1. Acute transverse oblique fracture of the T11 vertebral body with slight posterosuperior cortical buckling and 10-15% loss of anterior vertebral height. No epidural hematoma is seen. 2. Chronic severe compression fracture of the T12 vertebral body with severe retropulsion to the left, severe effacement of the left hemicanal  and at least borderline compression of the distal cord crowding it to the right. This was first seen on the PET-CT from 12/13/2022.  3. Acute nondisplaced fractures of the left anterolateral third and fourth ribs. 4. sclerotic lesions in the thoracic and lumbar spine, left ischial tuberosity, posterior right eighth and left third and seventh ribs, findings consistent with metastatic disease. 5. Elongate 1.9 x 1.6 x 5.9 cm rim enhancing hypodense collection in the low anterior pelvic left wall rectus muscle, consistent with phlegmon, viscous abscess or hematoma. 6. Probable cystitis with multiple bladder stones. 7. Likely proctitis new from 05/26/2024. 8. Aortic and coronary artery atherosclerosis. 9. 3.0 cm fusiform infrarenal AAA. Recommend follow-up every 3 years. 10. Osteopenia and degenerative change.  Lumbar scoliosis. Aortic Atherosclerosis (ICD10-I70.0). Electronically Signed   By: Francis Quam M.D.   On: 06/06/2024 23:36   CT L-SPINE NO CHARGE Result Date: 06/06/2024 CLINICAL DATA:  Clemens at home, with low back pain and low back impact. Hypotensive on evaluation by EMS at the scene. Malodorous urine also noted. EXAM: CT CHEST, ABDOMEN, AND PELVIS WITH CONTRAST CT THORACIC SPINE WITHOUT CONTRAST CT LUMBAR SPINE WITHOUT CONTRAST TECHNIQUE: Multidetector CT imaging of the chest, abdomen and pelvis was performed following the standard protocol during bolus administration of intravenous contrast. Multi detector CT imaging of the thoracic and lumbar spine was performed following the standard protocol without additional administration of contrast. Multiplanar CT image reconstructions were created and reviewed. RADIATION DOSE REDUCTION: This exam was performed according to the departmental dose-optimization program which includes automated exposure control, adjustment of the mA and/or kV according to patient size and/or use of iterative reconstruction technique. CONTRAST:  75mL OMNIPAQUE  IOHEXOL  350 MG/ML SOLN COMPARISON:  Most recent is CT scan abdomen pelvis and reconstructions 05/26/2024, before that chest CT with IV contrast 03/04/2022.  Also compared with CT abdomen and pelvis no contrast 07/30/2020. FINDINGS: CT CHEST FINDINGS Cardiovascular: Normal heart size. No pericardial effusion. Left main and three-vessel heavy calcific CAD again noted and LAD stenting. The aorta is tortuous with moderate to heavy calcific plaques without aneurysm, stenosis or dissection. There are mild calcific plaques in the great vessels without stenosis. Pulmonary arteries and veins are normal caliber. Mediastinum/Nodes: No adenopathy, mediastinal free air or fluid collections. Negative axillary spaces, thyroid gland. Tracheal ectasia without filling defect. Negative esophagus. Lungs/Pleura: There is posterior atelectasis. Scattered linear scar-like opacities in the bases and mild reticular biapical scarring. No consolidation, effusion or pneumothorax. Clear central airways. No appreciable nodules. Musculoskeletal: Remote severe T12 compression fracture with retropulsion. Acute fracture of T11 will be described below. There are acute nondisplaced fractures of the left anterolateral third and fourth ribs. There is increased osseous sclerosis in the posterolateral left seventh rib and in the anterolateral left third rib and also in the posterior right eighth rib, concern for blastic metastases. There is additional new sclerosis likely due to metastasis in the spinous processes of T1 and T2. There is a sclerotic lesion in the T10 vertebral body new from 2 years ago also concerning for metastasis, another is seen in the L1 body measuring 1.2 cm also new and not seen in 2021 either. CT ABDOMEN PELVIS FINDINGS Hepatobiliary: No hepatic injury or perihepatic hematoma. Gallbladder is unremarkable. No liver mass enhancement. Pancreas: No abnormality. Spleen: No abnormality. Adrenals/Urinary Tract: Adrenal glands are unremarkable. Kidneys are normal, without renal calculi, focal lesion, or hydronephrosis. There is mild generalized thickening of the bladder wall with multiple  stones inferiorly, largest is 2.3 cm. Correlate clinically  for cystitis. Stomach/Bowel: Unremarkable contracted appearance of the stomach. No small bowel obstruction or inflammation. An appendix is not seen. Uncomplicated sigmoid diverticulosis. The colon wall is normal in thickness until the rectum, which has become thickened since 05/26/2024 most likely due to proctitis. There is only a small amount of stool in the rectum. There is perirectal stranding drainage. Vascular/Lymphatic: Heavy aortoiliac calcific plaques. 3.0 cm fusiform infrarenal AAA. No dissection. No lymphadenopathy. Reproductive: He has had prior TURP procedure x2, most recently in 2021. No masses seen in prostate bed. Prostate XRT reportedly was also administered but there are no brachytherapy seeds. Other: Trace deep pelvic ascites. No free air. No incarcerated hernia. Musculoskeletal: Levoscoliosis and degenerative change lumbar spine with osteopenia. Sclerosis left ischial tuberosity consistent with metastatic disease. Small chronic sclerotic lesions right hemisacrum most likely bone islands. Within the low anterior pelvic left rectus muscle there is a rim enhancing hypodense collection, 1.9 x 1.6 cm on 4:106, 5.9 cm in height on 7:70, consistent with phlegmon, viscous abscess or hematoma. CT THORACIC SPINE WITHOUT CONTRAST Segmentation: Standard, with 12 rib-bearing thoracic type segments. Alignment: Chronic retropulsion and retrolisthesis at T12. Trace retrolisthesis of T11 also chronic. Vertebrae: Acute transverse oblique fracture extends through the T11 vertebral body from anterior to posterior, with slight posterosuperior cortical buckling. Loss of anterior vertebral height is 10-15%, with about 5% loss of the posterior, with slight retropulsion at the level of the posterosuperior cortical buckling. No apparent involvement of the pedicles and posterior elements. No epidural hematoma is visible. There is a chronic severe compression  fracture of the T12 vertebral body, with a severe retropulsion to the left. This was first seen on the PET-CT from 12/13/2022. There is severe effacement of the left hemicanal at T12, displacement of the distal cord over to the right and at least borderline compression of the distal cord. Other levels do not show significant bony or soft tissue encroachment on the thecal sac. No herniated discs are seen. Sclerotic lesions most likely due to metastases are noted in the T1 and T2 spinous processes and T10 vertebral body. No other fractures are seen. Above T11 the vertebrae are normal in height. Paraspinal abdominal soft tissues: Paraspinal edema noted at T10-11 without paraspinal hematoma or fluid collection. Disc levels: The discs are degenerated with no apparent herniation. No spinal canal hematoma is seen. There is mild spondylosis. High-grade narrowing particularly in the left hemicanal as above at T12 due to T12 retropulsion, chronic. There are slight facet spurs of the lower thoracic levels but no levels demonstrate critical foraminal stenosis. Other: None. CT LUMBAR SPINE WITHOUT CONTRAST Segmentation:Standard. Alignment:Mild levoscoliosis apex L4.  No AP listhesis. Vertebrae: No evidence of fractures. Sclerotic lesions compatible with metastases are noted with 2 in the L1 body. Osteopenia. Paraspinal and other soft tissues:No hematoma, mass or fluid collection. Disc levels:Only the L1-2 disc is normal in height. Other discs are variably collapsed, greatest disc space loss at L4-5 and L5-S1. There is lumbar spondylosis. Moderate acquired spinal stenosis L4-5 due to posterior disc osteophyte complex and posterior element hypertrophy. The spinous processes abut with opposing surface spurring L2-5, consistent with Baastrup's disease. No other notable thecal sac encroachment is seen. There are mild facet spurs at L3-4 and L5-S1 and moderate facet hypertrophy right greater than left L4-5. No critical foraminal  stenosis. Other: The SI joints are patent. No sacral insufficiency fracture. Osteopenia. Linear sclerosis in the posterior right ilium adjacent the SI joints compatible with a healed fracture. IMPRESSION: 1. Acute transverse  oblique fracture of the T11 vertebral body with slight posterosuperior cortical buckling and 10-15% loss of anterior vertebral height. No epidural hematoma is seen. 2. Chronic severe compression fracture of the T12 vertebral body with severe retropulsion to the left, severe effacement of the left hemicanal and at least borderline compression of the distal cord crowding it to the right. This was first seen on the PET-CT from 12/13/2022. 3. Acute nondisplaced fractures of the left anterolateral third and fourth ribs. 4. sclerotic lesions in the thoracic and lumbar spine, left ischial tuberosity, posterior right eighth and left third and seventh ribs, findings consistent with metastatic disease. 5. Elongate 1.9 x 1.6 x 5.9 cm rim enhancing hypodense collection in the low anterior pelvic left wall rectus muscle, consistent with phlegmon, viscous abscess or hematoma. 6. Probable cystitis with multiple bladder stones. 7. Likely proctitis new from 05/26/2024. 8. Aortic and coronary artery atherosclerosis. 9. 3.0 cm fusiform infrarenal AAA. Recommend follow-up every 3 years. 10. Osteopenia and degenerative change.  Lumbar scoliosis. Aortic Atherosclerosis (ICD10-I70.0). Electronically Signed   By: Francis Quam M.D.   On: 06/06/2024 23:36   CT T-SPINE NO CHARGE Result Date: 06/06/2024 CLINICAL DATA:  Clemens at home, with low back pain and low back impact. Hypotensive on evaluation by EMS at the scene. Malodorous urine also noted. EXAM: CT CHEST, ABDOMEN, AND PELVIS WITH CONTRAST CT THORACIC SPINE WITHOUT CONTRAST CT LUMBAR SPINE WITHOUT CONTRAST TECHNIQUE: Multidetector CT imaging of the chest, abdomen and pelvis was performed following the standard protocol during bolus administration of intravenous  contrast. Multi detector CT imaging of the thoracic and lumbar spine was performed following the standard protocol without additional administration of contrast. Multiplanar CT image reconstructions were created and reviewed. RADIATION DOSE REDUCTION: This exam was performed according to the departmental dose-optimization program which includes automated exposure control, adjustment of the mA and/or kV according to patient size and/or use of iterative reconstruction technique. CONTRAST:  75mL OMNIPAQUE  IOHEXOL  350 MG/ML SOLN COMPARISON:  Most recent is CT scan abdomen pelvis and reconstructions 05/26/2024, before that chest CT with IV contrast 03/04/2022. Also compared with CT abdomen and pelvis no contrast 07/30/2020. FINDINGS: CT CHEST FINDINGS Cardiovascular: Normal heart size. No pericardial effusion. Left main and three-vessel heavy calcific CAD again noted and LAD stenting. The aorta is tortuous with moderate to heavy calcific plaques without aneurysm, stenosis or dissection. There are mild calcific plaques in the great vessels without stenosis. Pulmonary arteries and veins are normal caliber. Mediastinum/Nodes: No adenopathy, mediastinal free air or fluid collections. Negative axillary spaces, thyroid gland. Tracheal ectasia without filling defect. Negative esophagus. Lungs/Pleura: There is posterior atelectasis. Scattered linear scar-like opacities in the bases and mild reticular biapical scarring. No consolidation, effusion or pneumothorax. Clear central airways. No appreciable nodules. Musculoskeletal: Remote severe T12 compression fracture with retropulsion. Acute fracture of T11 will be described below. There are acute nondisplaced fractures of the left anterolateral third and fourth ribs. There is increased osseous sclerosis in the posterolateral left seventh rib and in the anterolateral left third rib and also in the posterior right eighth rib, concern for blastic metastases. There is additional new  sclerosis likely due to metastasis in the spinous processes of T1 and T2. There is a sclerotic lesion in the T10 vertebral body new from 2 years ago also concerning for metastasis, another is seen in the L1 body measuring 1.2 cm also new and not seen in 2021 either. CT ABDOMEN PELVIS FINDINGS Hepatobiliary: No hepatic injury or perihepatic hematoma. Gallbladder is unremarkable.  No liver mass enhancement. Pancreas: No abnormality. Spleen: No abnormality. Adrenals/Urinary Tract: Adrenal glands are unremarkable. Kidneys are normal, without renal calculi, focal lesion, or hydronephrosis. There is mild generalized thickening of the bladder wall with multiple stones inferiorly, largest is 2.3 cm. Correlate clinically for cystitis. Stomach/Bowel: Unremarkable contracted appearance of the stomach. No small bowel obstruction or inflammation. An appendix is not seen. Uncomplicated sigmoid diverticulosis. The colon wall is normal in thickness until the rectum, which has become thickened since 05/26/2024 most likely due to proctitis. There is only a small amount of stool in the rectum. There is perirectal stranding drainage. Vascular/Lymphatic: Heavy aortoiliac calcific plaques. 3.0 cm fusiform infrarenal AAA. No dissection. No lymphadenopathy. Reproductive: He has had prior TURP procedure x2, most recently in 2021. No masses seen in prostate bed. Prostate XRT reportedly was also administered but there are no brachytherapy seeds. Other: Trace deep pelvic ascites. No free air. No incarcerated hernia. Musculoskeletal: Levoscoliosis and degenerative change lumbar spine with osteopenia. Sclerosis left ischial tuberosity consistent with metastatic disease. Small chronic sclerotic lesions right hemisacrum most likely bone islands. Within the low anterior pelvic left rectus muscle there is a rim enhancing hypodense collection, 1.9 x 1.6 cm on 4:106, 5.9 cm in height on 7:70, consistent with phlegmon, viscous abscess or hematoma. CT  THORACIC SPINE WITHOUT CONTRAST Segmentation: Standard, with 12 rib-bearing thoracic type segments. Alignment: Chronic retropulsion and retrolisthesis at T12. Trace retrolisthesis of T11 also chronic. Vertebrae: Acute transverse oblique fracture extends through the T11 vertebral body from anterior to posterior, with slight posterosuperior cortical buckling. Loss of anterior vertebral height is 10-15%, with about 5% loss of the posterior, with slight retropulsion at the level of the posterosuperior cortical buckling. No apparent involvement of the pedicles and posterior elements. No epidural hematoma is visible. There is a chronic severe compression fracture of the T12 vertebral body, with a severe retropulsion to the left. This was first seen on the PET-CT from 12/13/2022. There is severe effacement of the left hemicanal at T12, displacement of the distal cord over to the right and at least borderline compression of the distal cord. Other levels do not show significant bony or soft tissue encroachment on the thecal sac. No herniated discs are seen. Sclerotic lesions most likely due to metastases are noted in the T1 and T2 spinous processes and T10 vertebral body. No other fractures are seen. Above T11 the vertebrae are normal in height. Paraspinal abdominal soft tissues: Paraspinal edema noted at T10-11 without paraspinal hematoma or fluid collection. Disc levels: The discs are degenerated with no apparent herniation. No spinal canal hematoma is seen. There is mild spondylosis. High-grade narrowing particularly in the left hemicanal as above at T12 due to T12 retropulsion, chronic. There are slight facet spurs of the lower thoracic levels but no levels demonstrate critical foraminal stenosis. Other: None. CT LUMBAR SPINE WITHOUT CONTRAST Segmentation:Standard. Alignment:Mild levoscoliosis apex L4.  No AP listhesis. Vertebrae: No evidence of fractures. Sclerotic lesions compatible with metastases are noted with 2 in  the L1 body. Osteopenia. Paraspinal and other soft tissues:No hematoma, mass or fluid collection. Disc levels:Only the L1-2 disc is normal in height. Other discs are variably collapsed, greatest disc space loss at L4-5 and L5-S1. There is lumbar spondylosis. Moderate acquired spinal stenosis L4-5 due to posterior disc osteophyte complex and posterior element hypertrophy. The spinous processes abut with opposing surface spurring L2-5, consistent with Baastrup's disease. No other notable thecal sac encroachment is seen. There are mild facet spurs at L3-4 and  L5-S1 and moderate facet hypertrophy right greater than left L4-5. No critical foraminal stenosis. Other: The SI joints are patent. No sacral insufficiency fracture. Osteopenia. Linear sclerosis in the posterior right ilium adjacent the SI joints compatible with a healed fracture. IMPRESSION: 1. Acute transverse oblique fracture of the T11 vertebral body with slight posterosuperior cortical buckling and 10-15% loss of anterior vertebral height. No epidural hematoma is seen. 2. Chronic severe compression fracture of the T12 vertebral body with severe retropulsion to the left, severe effacement of the left hemicanal and at least borderline compression of the distal cord crowding it to the right. This was first seen on the PET-CT from 12/13/2022. 3. Acute nondisplaced fractures of the left anterolateral third and fourth ribs. 4. sclerotic lesions in the thoracic and lumbar spine, left ischial tuberosity, posterior right eighth and left third and seventh ribs, findings consistent with metastatic disease. 5. Elongate 1.9 x 1.6 x 5.9 cm rim enhancing hypodense collection in the low anterior pelvic left wall rectus muscle, consistent with phlegmon, viscous abscess or hematoma. 6. Probable cystitis with multiple bladder stones. 7. Likely proctitis new from 05/26/2024. 8. Aortic and coronary artery atherosclerosis. 9. 3.0 cm fusiform infrarenal AAA. Recommend follow-up  every 3 years. 10. Osteopenia and degenerative change.  Lumbar scoliosis. Aortic Atherosclerosis (ICD10-I70.0). Electronically Signed   By: Francis Quam M.D.   On: 06/06/2024 23:36   DG Chest Port 1 View Result Date: 06/06/2024 CLINICAL DATA:  Questionable sepsis - evaluate for abnormality EXAM: PORTABLE CHEST 1 VIEW COMPARISON:  05/26/2024 FINDINGS: The cardiomediastinal contours are stable. Aortic atherosclerosis. Coronary stent visualized. Pulmonary vasculature is normal. No consolidation, pleural effusion, or pneumothorax. 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 IMPRESSION: 1. No acute chest findings. 2. 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 Electronically Signed   By: Andrea Gasman M.D.   On: 06/06/2024 20:34    Review of Systems Blood pressure (!) 97/55, pulse (!) 102, temperature 99.3 F (37.4 C), resp. rate (!) 24, height 5' 11 (1.803 m), weight 64.5 kg, SpO2 93%. Physical Exam Constitutional:      Appearance: Normal appearance.  HENT:     Head: Normocephalic.  Eyes:     Extraocular Movements: Extraocular movements intact.     Pupils: Pupils are equal, round, and reactive to light.  Abdominal:     General: Abdomen is flat.     Palpations: Abdomen is soft.  Musculoskeletal:        General: Normal range of motion.     Cervical back: Normal range of motion and neck supple.     Comments: Minimal pain is noted in the lower thoracic region to palpation. Patient has full strength to dorsiflexion and plantarflexion with full sensation in his bilateral lower extremities.  Skin:    General: Skin is warm and dry.     Capillary Refill: Capillary refill takes less than 2 seconds.  Neurological:     General: No focal deficit present.     Mental Status: He is oriented to person, place, and time.  Psychiatric:        Mood and Affect: Mood normal.        Behavior: Behavior normal.     Assessment/Plan: Patient is noted to have an old compression fracture at T12, noted on imaging  from well over a year ago.  The fracture at T11 appears more subacute.  His pain from this is quite minimal.  Given his minimal pain and comorbidities, I do not feel any surgical intervention is warranted.  I do however recommend bracing, specifically, a TLSO brace, to be applied when the patient is standing and walking, to help expedite the healing of his more recent compression fracture.  Otherwise, I do not feel that any additional treatment beyond observation at this point is warranted.  I thoughts as outlined above were discussed with the patient.  I would be happy to see him back in the office to ensure adequate healing in one month.   Please feel free to reach out with any questions in the meantime.  Ronald Kaiser 06/07/2024, 1:02 PM

## 2024-06-07 NOTE — Progress Notes (Signed)
 eLink Physician-Brief Progress Note Patient Name: LAWERENCE DERY DOB: 1934/03/15 MRN: [id number]   Date of Service  06/07/2024  HPI/Events of Note  89 year old man with a history of paroxysmal atrial fibrillation, heart failure with reduced ejection fraction, and hypertension who initially presented with acute upper GI bleed and had persistent hypotension and was discharged 7 days prior and returns to the hospital with refractory hypotension thought to be secondary to sepsis of unclear source.  Vital signs within normal limits saturating 95% on room air.  Currently on low-dose norepinephrine  infusion.  Results consistent with mild electrolyte disturbances, leukocytosis and normocytic anemia.  Numerous skeletal abnormalities on CT chest/abdomen/pelvis and spine.  Concern for pelvic hematoma and likely cystitis.  eICU Interventions  Broad-spectrum antibiotics in place with vancomycin /cefepime .  Status post crystalloid bolus, ongoing crystalloid infusion, and norepinephrine  infusion  Type and screen, Q6R hemoglobin  DVT prophylaxis with SCDs and HSQ GI prophylaxis not indicated     Intervention Category Evaluation Type: New Patient Evaluation  Harly Pipkins 06/07/2024, 5:10 AM

## 2024-06-07 NOTE — Telephone Encounter (Signed)
 Called pt spouse ok per DPR.  Spouse would like Dr. Vertie team to see pt inpatient currently in ICU. Advised pt our office takes care of Outpatient care only.  Spouse will have to request Cardiology care from inpatient team.  Spouse expressed wants our office to send Dr. Pietro a message to come see pt.  I again advised pt to either request Cardiology care when providers come into the room or push call light and request Care from Dr. Vertie team.   Spouse voice began to get loud and demanding.  Advised pt I will end call I have provided with information and there is nothing else to offer.  Spouse requested my name provided and ended call.

## 2024-06-07 NOTE — Progress Notes (Signed)
 Echocardiogram 2D Echocardiogram has been performed.  Tinnie FORBES Gosling RDCS 06/07/2024, 8:37 AM

## 2024-06-07 NOTE — Telephone Encounter (Signed)
 Patients wife requesting callback. Husband is back in the hospital. Please advise.

## 2024-06-07 NOTE — Progress Notes (Signed)
 Pharmacy Electrolyte Replacement  Recent Labs:  Recent Labs    06/07/24 0231  K 3.5  MG 1.6*  CREATININE 0.77    Low Critical Values (K </= 2.5, Phos </= 1, Mg </= 1) Present: None  Plan: Give kcl 40 meq PO once. Recheck levels with AM labs.   Katy Brickell, PharmD

## 2024-06-07 NOTE — Progress Notes (Signed)
 NAME:  Ronald Kaiser, MRN:  [id number], DOB:  [birth year], LOS: 0 ADMISSION DATE:  06/06/2024 CHIEF COMPLAINT:  dizziness   History of Present Illness:  89 yo male presented today with c/o dizziness, found to have hypotension. Pt states that he was at home since his discharge 8/28 (admitted for similar complaints) when he stood from his table and found himself weak and dizzy. Pt then fell back and hit his back on the counter behind him but was able to lower himself into his wheelchair. Pt denies hitting his head or other bodily injury, denies lose of consciousness. Pt's wife notified EMS who found his BP low and prompted presentation to [facility]. Pt denies use of blood thinners. Denies cp, numbness, fever/chills, no n/v/d. Pt denies dysuria, no increased freq or decreased amounts but also states that he is mostly incontinent    He endorses improvement in his generalized weakness, resolution of his dizziness. States he has been in his relative normal state of health since his discharge with the occasional periods of weakness. + PO intake at baseline.    Ccm was asked to admit after failure to improve BP with IVF alone, esp in light of HFrEF, prompting need for vasopressors.     Interim History / Subjective:  H/o prostate cancer 2003 with recurrence 2020 Cad  ICM HFrEF (25-30% LVEF) H/o htn Aocd paf  Significant Hospital Events: 9/5: ICU admission. On levophed   Objective    Blood pressure (!) 97/55, pulse (!) 102, temperature 99.3 F (37.4 C), resp. rate (!) 24, height 5' 11 (1.803 m), weight 64.5 kg, SpO2 93%.        Intake/Output Summary (Last 24 hours) at 06/07/2024 0950 Last data filed at 06/07/2024 0800 Gross per 24 hour  Intake 3673.11 ml  Output 355 ml  Net 3318.11 ml   Filed Weights   06/06/24 1912  Weight: 64.5 kg    Examination: General: Elderly frail looking gentleman not in distress Lungs: clear to auscultation bilaterally.  Heart: regular rate rhythm, no murmur  appreciated.  Abdomen: non tender, non distended. Normal BS.  Neuro: axox3.  Moving all 4 extremities. Legs bilateral pitting edema.   Assessment and Plan  Shock likely septic: HFrEF EF 25 to 30%: History of CAD with cardiomyopathy: Bladder stones: NSTEMI type II: - UA likely UTI.  Cultures pending. - Cultures negative so far. - CT chest unremarkable - Vancomycin  plus cefepime . - POCUS showing dilated IVC.  No more fluids. - Continue pressors for MAP greater than 65. - Mentating well.  Lactate normal. - Resume aspirin  and statin.  Acute traumatic T11 vertebral fracture: Chronic T12 fracture: 3rd and 4th rib fracture: Metastatic lesion in thoracic, lumbar, left ischial tuberosity, right eighth, left 3rd and 7th ribs: History of prostate cancer: - Ortho consulted for T11 acute vertebral fracture. - Incentive spirometer. -Hold Xtandi  for now.  Will resume it once over sepsis.  Left rectal wall hematoma: History of iron  deficiency anemia-IV iron  replacement in the past: -Monitor hemoglobin.  No indication for transfusion.      Hypophosphatemia: Hypomagnesemia: - Replace as needed.   Loose bowel movement overnight.  Normal bowel movements today. Okay for regular diet. No IV fluids. DVT prophylaxis: SCDs. Hold heparin .    Labs   CBC: Recent Labs  Lab 06/06/24 2020 06/07/24 0231 06/07/24 0544 06/07/24 0737  WBC 10.7* 11.6* 13.4*  --   NEUTROABS 10.3*  --   --   --   HGB 7.6* 7.6* 7.6* 8.3*  HCT 23.2* 23.6* 23.2* 25.0*  MCV 97.5 98.7 95.9  --   PLT 222 220 243  --     Basic Metabolic Panel: Recent Labs  Lab 06/06/24 2020 06/07/24 0231  NA 132* 133*  K 3.6 3.5  CL 98 100  CO2 24 24  GLUCOSE 112* 129*  BUN 22 18  CREATININE 1.01 0.77  CALCIUM  8.0* 7.9*  MG  --  1.6*   GFR: Estimated Creatinine Clearance: 59.3 mL/min (by C-G formula based on SCr of 0.77 mg/dL). Recent Labs  Lab 06/06/24 2020 06/06/24 2037 06/07/24 0231 06/07/24 0544  WBC  10.7*  --  11.6* 13.4*  LATICACIDVEN  --  0.9  --   --     Liver Function Tests: Recent Labs  Lab 06/06/24 2020  AST 23  ALT 13  ALKPHOS 83  BILITOT 0.5  PROT 5.1*  ALBUMIN 1.7*   No results for input(s): LIPASE, AMYLASE in the last 168 hours. No results for input(s): AMMONIA in the last 168 hours.  ABG No results found for: PHART, PCO2ART, PO2ART, HCO3, TCO2, ACIDBASEDEF, O2SAT    Past Medical History:  He,  has a past medical history of AKI (acute kidney injury) (HCC) (11/12/2018), CAD (coronary artery disease), CHF (congestive heart failure) (HCC), Dyspnea, Foley catheter in place, HTN (hypertension), Hyperlipidemia, Myocardial infarction (HCC) (2000), Obesity, and Prostate cancer (HCC).   Surgical History:   Past Surgical History:  Procedure Laterality Date   CARDIAC CATHETERIZATION N/A 03/09/2016   Procedure: Left Heart Cath and Coronary Angiography;  Surgeon: Victory LELON Sharps, MD;  Location: [facility] INVASIVE CV LAB;  Service: Cardiovascular;  Laterality: N/A;   COLONOSCOPY N/A 05/28/2024   Procedure: COLONOSCOPY;  Surgeon: Leigh Elspeth SQUIBB, MD;  Location: [facility] ENDOSCOPY;  Service: Gastroenterology;  Laterality: N/A;   CYSTOSCOPY W/ RETROGRADES Bilateral 08/19/2020   Procedure: BILATERAL RETROGRADE PYELOGRAM;  Surgeon: Cam Morene LELON, MD;  Location: [facility] ORS;  Service: Urology;  Laterality: Bilateral;   ESOPHAGOGASTRODUODENOSCOPY N/A 05/28/2024   Procedure: EGD (ESOPHAGOGASTRODUODENOSCOPY);  Surgeon: Leigh Elspeth SQUIBB, MD;  Location: [facility] ENDOSCOPY;  Service: Gastroenterology;  Laterality: N/A;   RADIATION TO PROSTATE     Stenting of LAD  2000   TONSILLECTOMY     TRANSURETHRAL RESECTION OF PROSTATE N/A 03/01/2019   Procedure: TRANSURETHRAL RESECTION OF THE PROSTATE (TURP);  Surgeon: Cam Morene LELON, MD;  Location: [facility] ORS;  Service: Urology;  Laterality: N/A;  75 MINS   TRANSURETHRAL RESECTION OF PROSTATE N/A 08/19/2020   Procedure: TRANSURETHRAL  RESECTION OF THE PROSTATE (TURP);  Surgeon: Cam Morene LELON, MD;  Location: [facility] ORS;  Service: Urology;  Laterality: N/A;     Social History:   reports that he quit smoking about 61 years ago. His smoking use included cigarettes. He started smoking about 68 years ago. He has a 3.5 pack-year smoking history. He has never used smokeless tobacco. He reports current alcohol use. He reports that he does not currently use drugs.   Family History:  His family history includes Hypertension in his father. There is no history of Breast cancer, Prostate cancer, Colon cancer, or Pancreatic cancer.   Allergies Allergies  Allergen Reactions   Bactrim [Sulfamethoxazole-Trimethoprim] Hives, Shortness Of Breath and Swelling   Amoxicillin-Pot Clavulanate Diarrhea   Crestor  [Rosuvastatin  Calcium ] Other (See Comments)    Back pain,grogginess and dizziness   Zetia  [Ezetimibe ] Other (See Comments)    Back pain, grogginess and dizziness     Home Medications  Prior to Admission medications   Medication  Sig Start Date End Date Taking? Authorizing Provider  AMBULATORY NON FORMULARY MEDICATION Sucralfate 50 mg suppositories PR twice daily x 14 days 05/28/24   Leigh Elspeth SQUIBB, MD  aspirin  EC 81 MG tablet Take 81 mg by mouth daily. Swallow whole.    [provider]  CALCIUM  PO Take 1 tablet by mouth daily.    [provider]  enzalutamide  (XTANDI ) 40 MG capsule Take 160 mg by mouth daily.    [provider]  ferrous sulfate  325 (65 FE) MG EC tablet Take 325 mg by mouth daily with breakfast.    [provider]  furosemide  (LASIX ) 20 MG tablet Take 20 mg by mouth as needed for fluid or edema.    [provider]  Multiple Vitamins-Minerals (CENTRUM SILVER ULTRA MENS PO) Take 1 tablet by mouth daily.     [provider]  nitroGLYCERIN  (NITROSTAT ) 0.4 MG SL tablet Place 1 tablet (0.4 mg total) under the tongue every 5 (five) minutes as needed. 04/17/17    Pietro Redell RAMAN, MD  pravastatin  (PRAVACHOL ) 20 MG tablet Take 1 tablet (20 mg total) by mouth every evening. 08/01/23   Pietro Redell RAMAN, MD     CRITICAL CARE Performed by: Sammi JONETTA Fredericks.     Total critical care time: 40 minutes   Critical care time was exclusive of separately billable procedures and treating other patients.   Critical care was necessary to treat or prevent imminent or life-threatening deterioration.   Critical care was time spent personally by me on the following activities: development of treatment plan with patient and/or surrogate as well as nursing, discussions with consultants, evaluation of patient's response to treatment, examination of patient, obtaining history from patient or surrogate, ordering and performing treatments and interventions, ordering and review of laboratory studies, ordering and review of radiographic studies, pulse oximetry, re-evaluation of patient's condition and participation in multidisciplinary rounds.  Sammi JONETTA Fredericks, MD Pulmonary, Critical Care and Sleep Attending.  Pager: [phone]  06/07/2024, 9:50 AM

## 2024-06-07 NOTE — H&P (Signed)
 NAME:  Ronald Kaiser, MRN:  [id number], DOB:  10-14-1933, LOS: 0 ADMISSION DATE:  06/06/2024, CONSULTATION DATE:  06/07/24 REFERRING MD:  EDP, CHIEF COMPLAINT:  dizziness   History of Present Illness:  89 yo male presented today with c/o dizziness, found to have hypotension. Pt states that he was at home since his discharge 8/28 (admitted for similar complaints) when he stood from his table and found himself weak and dizzy. Pt then fell back and hit his back on the counter behind him but was able to lower himself into his wheelchair. Pt denies hitting his head or other bodily injury, denies lose of consciousness. Pt's wife notified EMS who found his BP low and prompted presentation to [facility]. Pt denies use of blood thinners. Denies cp, numbness, fever/chills, no n/v/d. Pt denies dysuria, no increased freq or decreased amounts but also states that he is mostly incontinent   He endorses improvement in his generalized weakness, resolution of his dizziness. States he has been in his relative normal state of health since his discharge with the occasional periods of weakness. + PO intake at baseline.   Ccm was asked to admit after failure to improve BP with IVF alone, esp in light of HFrEF, prompting need for vasopressors.   Pertinent  Medical History  H/o prostate cancer 2003 with recurrence 2020 Cad  ICM HFrEF (25-30% LVEF) H/o htn Aocd paf  Significant Hospital Events: Including procedures, antibiotic start and stop dates in addition to other pertinent events   Admitted to ICU 06/07/24  Interim History / Subjective:    Objective    Blood pressure (!) 97/58, pulse 98, temperature 97.7 F (36.5 C), temperature source Oral, resp. rate 20, height 5' 11 (1.803 m), weight 64.5 kg, SpO2 94%.        Intake/Output Summary (Last 24 hours) at 06/07/2024 0122 Last data filed at 06/07/2024 0022 Gross per 24 hour  Intake 2050 ml  Output --  Net 2050 ml   Filed Weights   06/06/24 1912  Weight: 64.5  kg    Examination: General: awake, alert conversant, appears chronically ill and pale HENT: ncat, eomi, perrla, hearing aid devices in place. Mm dry and pale Lungs: ctab Cardiovascular: irreg irreg Abdomen: soft ttp in LLQ and suprapubic area BS+ non distended, non tender Extremities: no c/c/ + non pitting edema in BL LE, follows commands Neuro: no focal deficits GU: deferred  Resolved problem list   Assessment and Plan  Shock, undetermined etiology/presumed sepsis Frequent UTI with suspected recurrence Pre syncope H/o htn HFrEF without acute exacerbation Cad with ICM Acute on chronic normocytic anemia Hypomag H/o prostate cancer with presumed mets -titrate vasopressors to map >65 -repeat echo limited for new wall motion abnormalities -await ua and pan cx -empiric abx for now -malodorous urine noted upon entering the room -no overt blood loss at this time -will type and cross for 1 U PRBC at this time with need for pressors.  -replace mag -goals of care conversation -will need pt/ot     Labs   CBC: Recent Labs  Lab 06/06/24 2020  WBC 10.7*  NEUTROABS 10.3*  HGB 7.6*  HCT 23.2*  MCV 97.5  PLT 222    Basic Metabolic Panel: Recent Labs  Lab 06/06/24 2020  NA 132*  K 3.6  CL 98  CO2 24  GLUCOSE 112*  BUN 22  CREATININE 1.01  CALCIUM  8.0*   GFR: Estimated Creatinine Clearance: 47 mL/min (by C-G formula based on SCr of 1.01  mg/dL). Recent Labs  Lab 06/06/24 2020 06/06/24 2037  WBC 10.7*  --   LATICACIDVEN  --  0.9    Liver Function Tests: Recent Labs  Lab 06/06/24 2020  AST 23  ALT 13  ALKPHOS 83  BILITOT 0.5  PROT 5.1*  ALBUMIN 1.7*   No results for input(s): LIPASE, AMYLASE in the last 168 hours. No results for input(s): AMMONIA in the last 168 hours.  ABG No results found for: PHART, PCO2ART, PO2ART, HCO3, TCO2, ACIDBASEDEF, O2SAT   Coagulation Profile: Recent Labs  Lab 06/06/24 2020  INR 1.2     Cardiac Enzymes: No results for input(s): CKTOTAL, CKMB, CKMBINDEX, TROPONINI in the last 168 hours.  HbA1C: No results found for: HGBA1C  CBG: No results for input(s): GLUCAP in the last 168 hours.  Review of Systems:   As per HPI  Past Medical History:  He,  has a past medical history of AKI (acute kidney injury) (HCC) (11/12/2018), CAD (coronary artery disease), CHF (congestive heart failure) (HCC), Dyspnea, Foley catheter in place, HTN (hypertension), Hyperlipidemia, Myocardial infarction (HCC) (2000), Obesity, and Prostate cancer (HCC).   Surgical History:   Past Surgical History:  Procedure Laterality Date   CARDIAC CATHETERIZATION N/A 03/09/2016   Procedure: Left Heart Cath and Coronary Angiography;  Surgeon: Victory LELON Sharps, MD;  Location[facility] INVASIVE CV LAB;  Service: Cardiovascular;  Laterality: N/A;   COLONOSCOPY N/A 05/28/2024   Procedure: COLONOSCOPY;  Surgeon: Leigh Elspeth SQUIBB, MD;  Location: [facility] ENDOSCOPY;  Service: Gastroenterology;  Laterality: N/A;   CYSTOSCOPY W/ RETROGRADES Bilateral 08/19/2020   Procedure: BILATERAL RETROGRADE PYELOGRAM;  Surgeon: Cam Morene LELON, MD;  Location: [facility] ORS;  Service: Urology;  Laterality: Bilateral;   ESOPHAGOGASTRODUODENOSCOPY N/A 05/28/2024   Procedure: EGD (ESOPHAGOGASTRODUODENOSCOPY);  Surgeon: Leigh Elspeth SQUIBB, MD;  Location: [facility] ENDOSCOPY;  Service: Gastroenterology;  Laterality: N/A;   RADIATION TO PROSTATE     Stenting of LAD  2000   TONSILLECTOMY     TRANSURETHRAL RESECTION OF PROSTATE N/A 03/01/2019   Procedure: TRANSURETHRAL RESECTION OF THE PROSTATE (TURP);  Surgeon: Cam Morene LELON, MD;  Location: [facility] ORS;  Service: Urology;  Laterality: N/A;  75 MINS   TRANSURETHRAL RESECTION OF PROSTATE N/A 08/19/2020   Procedure: TRANSURETHRAL RESECTION OF THE PROSTATE (TURP);  Surgeon: Cam Morene LELON, MD;  Location: [facility] ORS;  Service: Urology;  Laterality: N/A;     Social History:   reports that he  quit smoking about 61 years ago. His smoking use included cigarettes. He started smoking about 68 years ago. He has a 3.5 pack-year smoking history. He has never used smokeless tobacco. He reports current alcohol use. He reports that he does not currently use drugs.   Family History:  His family history includes Hypertension in his father. There is no history of Breast cancer, Prostate cancer, Colon cancer, or Pancreatic cancer.   Allergies Allergies  Allergen Reactions   Bactrim [Sulfamethoxazole-Trimethoprim] Hives, Shortness Of Breath and Swelling   Amoxicillin-Pot Clavulanate Diarrhea   Crestor  [Rosuvastatin  Calcium ] Other (See Comments)    Back pain,grogginess and dizziness   Zetia  [Ezetimibe ] Other (See Comments)    Back pain, grogginess and dizziness     Home Medications  Prior to Admission medications   Medication Sig Start Date End Date Taking? Authorizing Provider  AMBULATORY NON FORMULARY MEDICATION Sucralfate 50 mg suppositories PR twice daily x 14 days 05/28/24   Leigh Elspeth SQUIBB, MD  aspirin  EC 81 MG tablet Take 81 mg by mouth daily. Swallow whole.  [provider]  CALCIUM  PO Take 1 tablet by mouth daily.    [provider]  enzalutamide  (XTANDI ) 40 MG capsule Take 160 mg by mouth daily.    [provider]  ferrous sulfate  325 (65 FE) MG EC tablet Take 325 mg by mouth daily with breakfast.    [provider]  furosemide  (LASIX ) 20 MG tablet Take 20 mg by mouth as needed for fluid or edema.    [provider]  Multiple Vitamins-Minerals (CENTRUM SILVER ULTRA MENS PO) Take 1 tablet by mouth daily.     [provider]  nitroGLYCERIN  (NITROSTAT ) 0.4 MG SL tablet Place 1 tablet (0.4 mg total) under the tongue every 5 (five) minutes as needed. 04/17/17   Pietro Redell RAMAN, MD  pravastatin  (PRAVACHOL ) 20 MG tablet Take 1 tablet (20 mg total) by mouth every evening. 08/01/23   Pietro Redell RAMAN, MD     Critical care time:  

## 2024-06-08 ENCOUNTER — Encounter (HOSPITAL_COMMUNITY): Payer: Self-pay | Admitting: Critical Care Medicine

## 2024-06-08 DIAGNOSIS — I5020 Unspecified systolic (congestive) heart failure: Secondary | ICD-10-CM

## 2024-06-08 DIAGNOSIS — I214 Non-ST elevation (NSTEMI) myocardial infarction: Secondary | ICD-10-CM

## 2024-06-08 DIAGNOSIS — S22089A Unspecified fracture of T11-T12 vertebra, initial encounter for closed fracture: Secondary | ICD-10-CM

## 2024-06-08 LAB — BASIC METABOLIC PANEL WITH GFR
Anion gap: 9 (ref 5–15)
BUN: 20 mg/dL (ref 8–23)
CO2: 22 mmol/L (ref 22–32)
Calcium: 7.3 mg/dL — ABNORMAL LOW (ref 8.9–10.3)
Chloride: 101 mmol/L (ref 98–111)
Creatinine, Ser: 0.76 mg/dL (ref 0.61–1.24)
GFR, Estimated: >60 mL/min (ref >60)
Glucose, Bld: 134 mg/dL — ABNORMAL HIGH (ref 70–99)
Potassium: 3.8 mmol/L (ref 3.5–5.1)
Sodium: 132 mmol/L — ABNORMAL LOW (ref 135–145)

## 2024-06-08 LAB — HEMOGLOBIN AND HEMATOCRIT, BLOOD
HCT: 22.5 % — ABNORMAL LOW (ref 39.0–52.0)
Hemoglobin: 7.4 g/dL — ABNORMAL LOW (ref 13.0–17.0)

## 2024-06-08 LAB — URINE CULTURE: Culture: NO GROWTH

## 2024-06-08 LAB — CBC
HCT: 22.2 % — ABNORMAL LOW (ref 39.0–52.0)
Hemoglobin: 7.4 g/dL — ABNORMAL LOW (ref 13.0–17.0)
MCH: 31.9 pg (ref 26.0–34.0)
MCHC: 33.3 g/dL (ref 30.0–36.0)
MCV: 95.7 fL (ref 80.0–100.0)
Platelets: 193 K/uL (ref 150–400)
RBC: 2.32 MIL/uL — ABNORMAL LOW (ref 4.22–5.81)
RDW: 15.6 % — ABNORMAL HIGH (ref 11.5–15.5)
WBC: 11.4 K/uL — ABNORMAL HIGH (ref 4.0–10.5)
nRBC: 0 % (ref 0.0–0.2)

## 2024-06-08 MED ORDER — MIDODRINE HCL 5 MG PO TABS
5.0000 mg | ORAL_TABLET | Freq: Three times a day (TID) | ORAL | Status: DC
  Administered 2024-06-08 – 2024-06-10 (×6): 5 mg via ORAL
  Filled 2024-06-08 (×6): qty 1

## 2024-06-08 NOTE — Progress Notes (Signed)
 [facility] notified of HgB 7.4. No noted bleeding, no complaints from patient.  Patient remains on a low dose of Levo to maintain MAP >65.  No new orders at this time. Will continue to monitor.

## 2024-06-08 NOTE — TOC CAGE-AID Note (Signed)
 Transition of Care [facility]) - CAGE-AID Screening   Patient Details  Name: Ronald Kaiser MRN: [id number] Date of Birth: 1934/08/01  Transition of Care [facility]) CM/SW Contact:    Ronald ROCKIE ORN, RN Phone Number: [phone] 06/08/2024, 4:19 PM   Clinical Narrative: Pt denies drug abuse and reports occasional wine and beer use. No resources needed.  Screening complete.   CAGE-AID Screening:    Have You Ever Felt You Ought to Cut Down on Your Drinking or Drug Use?: No Have People Annoyed You By Critizing Your Drinking Or Drug Use?: No Have You Felt Bad Or Guilty About Your Drinking Or Drug Use?: No Have You Ever Had a Drink or Used Drugs First Thing In The Morning to Steady Your Nerves or to Get Rid of a Hangover?: No CAGE-AID Score: 0  Substance Abuse Education Offered: No

## 2024-06-08 NOTE — Progress Notes (Signed)
 Orthopedic Tech Progress Note Patient Details:  Ronald Kaiser [birth year] [id number] TLSO was sized and left at bedside. Patient was given instructions on how to apply and remove the brace. Ortho Devices Type of Ortho Device: Thoracolumbar corset (TLSO) Ortho Device/Splint Interventions: Ordered, Adjustment   Post Interventions Instructions Provided: Adjustment of device  Ronald Kaiser E Ronald Kaiser 06/08/2024, 8:28 AM

## 2024-06-08 NOTE — Plan of Care (Signed)

## 2024-06-08 NOTE — Progress Notes (Signed)
 NAME:  Ronald Kaiser, MRN:  [id number], DOB:  July 23, 1934, LOS: 1 ADMISSION DATE:  06/06/2024 CHIEF COMPLAINT:  dizziness   History of Present Illness:  89 yo male presented today with c/o dizziness, found to have hypotension. Pt states that he was at home since his discharge 8/28 (admitted for similar complaints) when he stood from his table and found himself weak and dizzy. Pt then fell back and hit his back on the counter behind him but was able to lower himself into his wheelchair. Pt denies hitting his head or other bodily injury, denies lose of consciousness. Pt's wife notified EMS who found his BP low and prompted presentation to [facility]. Pt denies use of blood thinners. Denies cp, numbness, fever/chills, no n/v/d. Pt denies dysuria, no increased freq or decreased amounts but also states that he is mostly incontinent    He endorses improvement in his generalized weakness, resolution of his dizziness. States he has been in his relative normal state of health since his discharge with the occasional periods of weakness. + PO intake at baseline.    Ccm was asked to admit after failure to improve BP with IVF alone, esp in light of HFrEF, prompting need for vasopressors.     Interim History / Subjective:   Doing well.  No overnight events.  Intermittently off Levophed .  Currently on 1 mcg of Levophed .  PMH: H/o prostate cancer 2003 with recurrence 2020 Cad  ICM HFrEF (25-30% LVEF) H/o htn Aocd paf  Significant Hospital Events: 9/5: ICU admission. On levophed   Objective    Blood pressure (!) 106/55, pulse 87, temperature 98.2 F (36.8 C), temperature source Axillary, resp. rate (!) 23, height 5' 11 (1.803 m), weight 64.5 kg, SpO2 95%.        Intake/Output Summary (Last 24 hours) at 06/08/2024 1027 Last data filed at 06/08/2024 0900 Gross per 24 hour  Intake 999.2 ml  Output 1610 ml  Net -610.8 ml   Filed Weights   06/06/24 1912  Weight: 64.5 kg    Examination: General:  Elderly frail looking gentleman not in distress Lungs: clear to auscultation bilaterally.  Heart: regular rate rhythm, no murmur appreciated.  Abdomen: non tender, non distended. Normal BS.  Neuro: axox3.  Moving all 4 extremities. Legs bilateral pitting edema.   Assessment and Plan  Shock likely septic: HFrEF EF 25 to 30%: History of CAD with cardiomyopathy: Bladder stones: NSTEMI type II: - UA likely UTI.  Cultures pending. - Cultures negative so far. - CT chest unremarkable - Continue cefepime .  DC vancomycin .  MRSA screen negative. - POCUS showing dilated IVC.  No more fluids. - Continue Levophed  for MAP goal greater than 65.  Added midodrine  5 mg 3 times daily.  If patient is able to get off of it will transfer to floor.. - Mentating well.  Lactate normal. - Resume aspirin  and statin.  Acute traumatic T11 vertebral fracture: Chronic T12 fracture: 3rd and 4th rib fracture: Metastatic lesion in thoracic, lumbar, left ischial tuberosity, right eighth, left 3rd and 7th ribs: History of prostate cancer: - Ortho consulted for T11 acute vertebral fracture.  Advised TLSO brace.  No surgical interventions. - Incentive spirometer. - Hold Xtandi  for now.  Will resume it once over sepsis.  Left rectal wall hematoma: History of iron  deficiency anemia-IV iron  replacement in the past: -Monitor hemoglobin.  No indication for transfusion.   - General Surgery consult to rule out that the rectal wall hematoma is not actually an abscess.  Hypophosphatemia: Hypomagnesemia: - Replace as needed.  Okay for regular diet. No IV fluids. DVT prophylaxis: SCDs. Hold heparin  for now.  Can restart it tomorrow if hemoglobin stable.   Labs   CBC: Recent Labs  Lab 06/06/24 2020 06/07/24 0231 06/07/24 0544 06/07/24 0737 06/07/24 1358 06/07/24 1837 06/08/24 0219 06/08/24 0542  WBC 10.7* 11.6* 13.4*  --   --   --  11.4*  --   NEUTROABS 10.3*  --   --   --   --   --   --   --   HGB 7.6*  7.6* 7.6* 8.3* 8.2* 7.6* 7.4* 7.4*  HCT 23.2* 23.6* 23.2* 25.0* 25.6* 23.7* 22.2* 22.5*  MCV 97.5 98.7 95.9  --   --   --  95.7  --   PLT 222 220 243  --   --   --  193  --     Basic Metabolic Panel: Recent Labs  Lab 06/06/24 2020 06/07/24 0231 06/08/24 0219  NA 132* 133* 132*  K 3.6 3.5 3.8  CL 98 100 101  CO2 24 24 22   GLUCOSE 112* 129* 134*  BUN 22 18 20   CREATININE 1.01 0.77 0.76  CALCIUM  8.0* 7.9* 7.3*  MG  --  1.6*  --    GFR: Estimated Creatinine Clearance: 59.3 mL/min (by C-G formula based on SCr of 0.76 mg/dL). Recent Labs  Lab 06/06/24 2020 06/06/24 2037 06/07/24 0231 06/07/24 0544 06/08/24 0219  WBC 10.7*  --  11.6* 13.4* 11.4*  LATICACIDVEN  --  0.9  --   --   --     Liver Function Tests: Recent Labs  Lab 06/06/24 2020  AST 23  ALT 13  ALKPHOS 83  BILITOT 0.5  PROT 5.1*  ALBUMIN 1.7*   No results for input(s): LIPASE, AMYLASE in the last 168 hours. No results for input(s): AMMONIA in the last 168 hours.  ABG No results found for: PHART, PCO2ART, PO2ART, HCO3, TCO2, ACIDBASEDEF, O2SAT    Past Medical History:  He,  has a past medical history of AKI (acute kidney injury) (HCC) (11/12/2018), CAD (coronary artery disease), CHF (congestive heart failure) (HCC), Dyspnea, Foley catheter in place, HTN (hypertension), Hyperlipidemia, Myocardial infarction (HCC) (2000), Obesity, and Prostate cancer (HCC).   Surgical History:   Past Surgical History:  Procedure Laterality Date   CARDIAC CATHETERIZATION N/A 03/09/2016   Procedure: Left Heart Cath and Coronary Angiography;  Surgeon: Victory LELON Sharps, MD;  Location: [facility] INVASIVE CV LAB;  Service: Cardiovascular;  Laterality: N/A;   COLONOSCOPY N/A 05/28/2024   Procedure: COLONOSCOPY;  Surgeon: Leigh Elspeth SQUIBB, MD;  Location: [facility] ENDOSCOPY;  Service: Gastroenterology;  Laterality: N/A;   CYSTOSCOPY W/ RETROGRADES Bilateral 08/19/2020   Procedure: BILATERAL RETROGRADE PYELOGRAM;  Surgeon:  Cam Morene LELON, MD;  Location: [facility] ORS;  Service: Urology;  Laterality: Bilateral;   ESOPHAGOGASTRODUODENOSCOPY N/A 05/28/2024   Procedure: EGD (ESOPHAGOGASTRODUODENOSCOPY);  Surgeon: Leigh Elspeth SQUIBB, MD;  Location: [facility] ENDOSCOPY;  Service: Gastroenterology;  Laterality: N/A;   RADIATION TO PROSTATE     Stenting of LAD  2000   TONSILLECTOMY     TRANSURETHRAL RESECTION OF PROSTATE N/A 03/01/2019   Procedure: TRANSURETHRAL RESECTION OF THE PROSTATE (TURP);  Surgeon: Cam Morene LELON, MD;  Location: [facility] ORS;  Service: Urology;  Laterality: N/A;  75 MINS   TRANSURETHRAL RESECTION OF PROSTATE N/A 08/19/2020   Procedure: TRANSURETHRAL RESECTION OF THE PROSTATE (TURP);  Surgeon: Cam Morene LELON, MD;  Location: [facility] ORS;  Service: Urology;  Laterality: N/A;     Social History:   reports that he quit smoking about 61 years ago. His smoking use included cigarettes. He started smoking about 68 years ago. He has a 3.5 pack-year smoking history. He has never used smokeless tobacco. He reports current alcohol use. He reports that he does not currently use drugs.   Family History:  His family history includes Hypertension in his father. There is no history of Breast cancer, Prostate cancer, Colon cancer, or Pancreatic cancer.   Allergies Allergies  Allergen Reactions   Bactrim [Sulfamethoxazole-Trimethoprim] Hives, Shortness Of Breath and Swelling   Augmentin [Amoxicillin-Pot Clavulanate] Diarrhea   Crestor  [Rosuvastatin  Calcium ] Other (See Comments)    Back pain Grogginess  Dizziness    Zetia  [Ezetimibe ] Other (See Comments)    Back pain Grogginess  Dizziness      Home Medications  Prior to Admission medications   Medication Sig Start Date End Date Taking? Authorizing Provider  AMBULATORY NON FORMULARY MEDICATION Sucralfate 50 mg suppositories PR twice daily x 14 days 05/28/24   Leigh Elspeth SQUIBB, MD  aspirin  EC 81 MG tablet Take 81 mg by mouth daily. Swallow whole.    [provider]  CALCIUM  PO Take 1 tablet by mouth daily.    [provider]  enzalutamide  (XTANDI ) 40 MG capsule Take 160 mg by mouth daily.    [provider]  ferrous sulfate  325 (65 FE) MG EC tablet Take 325 mg by mouth daily with breakfast.    [provider]  furosemide  (LASIX ) 20 MG tablet Take 20 mg by mouth as needed for fluid or edema.    [provider]  Multiple Vitamins-Minerals (CENTRUM SILVER ULTRA MENS PO) Take 1 tablet by mouth daily.     [provider]  nitroGLYCERIN  (NITROSTAT ) 0.4 MG SL tablet Place 1 tablet (0.4 mg total) under the tongue every 5 (five) minutes as needed. 04/17/17   Pietro Redell RAMAN, MD  pravastatin  (PRAVACHOL ) 20 MG tablet Take 1 tablet (20 mg total) by mouth every evening. 08/01/23   Pietro Redell RAMAN, MD     CRITICAL CARE Performed by: Sammi JONETTA Fredericks.     Total critical care time: 35 minutes   Critical care time was exclusive of separately billable procedures and treating other patients.   Critical care was necessary to treat or prevent imminent or life-threatening deterioration.   Critical care was time spent personally by me on the following activities: development of treatment plan with patient and/or surrogate as well as nursing, discussions with consultants, evaluation of patient's response to treatment, examination of patient, obtaining history from patient or surrogate, ordering and performing treatments and interventions, ordering and review of laboratory studies, ordering and review of radiographic studies, pulse oximetry, re-evaluation of patient's condition and participation in multidisciplinary rounds.  Sammi JONETTA Fredericks, MD Pulmonary, Critical Care and Sleep Attending.  Pager: [phone]  06/08/2024, 10:27 AM

## 2024-06-08 NOTE — Consult Note (Signed)
 Ronald Kaiser July 13, 1934  [id number].    Requesting MD: Dr. Sammi Fredericks Chief Complaint/Reason for Consult: rectus hematoma vs abscess  HPI:  Ronald Kaiser is an 89 yo male who was recently admitted last week with an acute GI bleed, and was found to have severe radiation proctitis. He was discharged home on 8/28. He was readmitted yesterday after an episode at home where he felt weak and fell backwards onto a counter behind him, and hit his bad. He denies hitting his head or abdomen. Imaging workup at admission (the night of 9/4) showed an acute T11 vertebral body fracture, as well as a chronic compression deformity of T12. He also has acute rib fractures, and a collection in the left rectus muscle, consistent with an abscess vs hematoma. He was hypotensive at admission, and was admitted to the ICU on CCM service. Of note patient was orthostatic on recent admission. He remains on low dose levophed  this morning with concerns for sepsis. General surgery was consulted to evaluate for a possible abscess within the rectus sheath. On my exam the patient is alert and oriented, and says he is not currently having abdominal pain. He is not on any blood thinners currently and was not on any at the time of his fall.   ROS: Review of Systems  Constitutional:  Negative for chills and fever.  Respiratory:  Negative for shortness of breath.   Musculoskeletal:  Positive for falls.  Neurological:  Positive for dizziness.    Family History  Problem Relation Age of Onset   Hypertension Father    Breast cancer Neg Hx    Prostate cancer Neg Hx    Colon cancer Neg Hx    Pancreatic cancer Neg Hx     Past Medical History:  Diagnosis Date   AKI (acute kidney injury) (HCC) 11/12/2018   CAD (coronary artery disease)    CHF (congestive heart failure) (HCC)    Dyspnea    Foley catheter in place    HTN (hypertension)    Hyperlipidemia    Myocardial infarction (HCC) 2000   REPORTS HE HAS HAD 5 HEART ATTACKS  BUT SOME OF THE WERE NOT SERIOUS    Obesity    Prostate cancer [facility])     Past Surgical History:  Procedure Laterality Date   CARDIAC CATHETERIZATION N/A 03/09/2016   Procedure: Left Heart Cath and Coronary Angiography;  Surgeon: Victory LELON Sharps, MD;  Location: [facility] INVASIVE CV LAB;  Service: Cardiovascular;  Laterality: N/A;   COLONOSCOPY N/A 05/28/2024   Procedure: COLONOSCOPY;  Surgeon: Leigh Elspeth SQUIBB, MD;  Location: [facility] ENDOSCOPY;  Service: Gastroenterology;  Laterality: N/A;   CYSTOSCOPY W/ RETROGRADES Bilateral 08/19/2020   Procedure: BILATERAL RETROGRADE PYELOGRAM;  Surgeon: Cam Morene LELON, MD;  Location: [facility] ORS;  Service: Urology;  Laterality: Bilateral;   ESOPHAGOGASTRODUODENOSCOPY N/A 05/28/2024   Procedure: EGD (ESOPHAGOGASTRODUODENOSCOPY);  Surgeon: Leigh Elspeth SQUIBB, MD;  Location[facility] ENDOSCOPY;  Service: Gastroenterology;  Laterality: N/A;   RADIATION TO PROSTATE     Stenting of LAD  2000   TONSILLECTOMY     TRANSURETHRAL RESECTION OF PROSTATE N/A 03/01/2019   Procedure: TRANSURETHRAL RESECTION OF THE PROSTATE (TURP);  Surgeon: Cam Morene LELON, MD;  Location: [facility] ORS;  Service: Urology;  Laterality: N/A;  75 MINS   TRANSURETHRAL RESECTION OF PROSTATE N/A 08/19/2020   Procedure: TRANSURETHRAL RESECTION OF THE PROSTATE (TURP);  Surgeon: Cam Morene LELON, MD;  Location: [facility] ORS;  Service: Urology;  Laterality: N/A;    Social History:  reports that he quit smoking about 61 years ago. His smoking use included cigarettes. He started smoking about 68 years ago. He has a 3.5 pack-year smoking history. He has never used smokeless tobacco. He reports current alcohol use. He reports that he does not currently use drugs.  Allergies:  Allergies  Allergen Reactions   Bactrim [Sulfamethoxazole-Trimethoprim] Hives, Shortness Of Breath and Swelling   Augmentin [Amoxicillin-Pot Clavulanate] Diarrhea   Crestor  [Rosuvastatin  Calcium ] Other (See Comments)    Back pain Grogginess   Dizziness    Zetia  Basilie.Boll ] Other (See Comments)    Back pain Grogginess  Dizziness     Medications Prior to Admission  Medication Sig Dispense Refill   AMBULATORY NON FORMULARY MEDICATION Sucralfate 50 mg suppositories PR twice daily x 14 days 28 suppository 0   aspirin  EC 81 MG tablet Take 81 mg by mouth daily.     Calcium  Carb-Cholecalciferol (CALCIUM  + VITAMIN D3 PO) Take 1 tablet by mouth daily.     enzalutamide  (XTANDI ) 40 MG capsule Take 160 mg by mouth daily.     ferrous sulfate  325 (65 FE) MG EC tablet Take 325 mg by mouth daily with breakfast.     furosemide  (LASIX ) 20 MG tablet Take 20 mg by mouth as needed for fluid or edema.     Multiple Vitamins-Minerals (CENTRUM SILVER MEN 50+) TABS Take 1 tablet by mouth daily.     nitroGLYCERIN  (NITROSTAT ) 0.4 MG SL tablet Place 1 tablet (0.4 mg total) under the tongue every 5 (five) minutes as needed. 25 tablet 12   pravastatin  (PRAVACHOL ) 20 MG tablet Take 1 tablet (20 mg total) by mouth every evening. 90 tablet 3     Physical Exam: Blood pressure (!) 106/55, pulse 87, temperature 98.2 F (36.8 C), temperature source Axillary, resp. rate (!) 23, height 5' 11 (1.803 m), weight 64.5 kg, SpO2 95%. General: resting comfortably, appears stated age, no apparent distress Neurological: alert and oriented, no focal deficits, cranial nerves grossly in tact HEENT: normocephalic, atraumatic CV: irregular rhythm Respiratory: normal work of breathing  Abdomen: soft, nondistended, nontender to palpation. No abdominal wall cellulitis, known left inferior rectus hematoma is not clearly palpable on exam, but there is no tenderness in this location. Extremities: warm and well-perfused, no deformities, moving all extremities spontaneously Psychiatric: normal mood and affect Skin: warm and dry, no jaundice, no rashes or lesions    Assessment/Plan 89 yo male admitted after dizziness with fall at home.  - Acute T11 vertebral fracture: Seen by  ortho spine, recommending TLSO brace when mobilizing. - Rib fractures: pain control, pulmonary toilet - Rectus sheath collection: most likely a small hematoma. There is no overlying cellulitis on exam, and the patient does not have significant tenderness in the area. Hgb has remained stable since admission. Will continue to monitor clinically, and if there are signs of progression, worsening pain, or developing cellulitis, can proceed with drainage. Would like request percutaneous drainage by IR as the collection is deep and difficult to localize on exam alone. - Surgery will follow   Leonor Dawn, MD [facility] General, Hepatobiliary and Pancreatic Surgery 06/08/24 10:35 AM

## 2024-06-09 DIAGNOSIS — R42 Dizziness and giddiness: Secondary | ICD-10-CM | POA: Diagnosis not present

## 2024-06-09 LAB — CBC
HCT: 21.4 % — ABNORMAL LOW (ref 39.0–52.0)
Hemoglobin: 7 g/dL — ABNORMAL LOW (ref 13.0–17.0)
MCH: 31.3 pg (ref 26.0–34.0)
MCHC: 32.7 g/dL (ref 30.0–36.0)
MCV: 95.5 fL (ref 80.0–100.0)
Platelets: 215 K/uL (ref 150–400)
RBC: 2.24 MIL/uL — ABNORMAL LOW (ref 4.22–5.81)
RDW: 15.4 % (ref 11.5–15.5)
WBC: 9.6 K/uL (ref 4.0–10.5)
nRBC: 0 % (ref 0.0–0.2)

## 2024-06-09 LAB — BASIC METABOLIC PANEL WITH GFR
Anion gap: 5 (ref 5–15)
BUN: 14 mg/dL (ref 8–23)
CO2: 25 mmol/L (ref 22–32)
Calcium: 7.4 mg/dL — ABNORMAL LOW (ref 8.9–10.3)
Chloride: 101 mmol/L (ref 98–111)
Creatinine, Ser: 0.65 mg/dL (ref 0.61–1.24)
GFR, Estimated: >60 mL/min (ref >60)
Glucose, Bld: 99 mg/dL (ref 70–99)
Potassium: 3.7 mmol/L (ref 3.5–5.1)
Sodium: 131 mmol/L — ABNORMAL LOW (ref 135–145)

## 2024-06-09 NOTE — Plan of Care (Signed)

## 2024-06-09 NOTE — Progress Notes (Signed)
 [facility] Progress Note     Subjective: CC:  Denies abdominal pain. Ate a large breakfast.   Objective: Vital signs in last 24 hours: Temp:  [97.8 F (36.6 C)-98.5 F (36.9 C)] 98.5 F (36.9 C) (09/07 1122) Pulse Rate:  [68-100] 100 (09/07 1122) Resp:  [12-23] 18 (09/07 1133) BP: (84-149)/(43-93) 91/50 (09/07 1122) SpO2:  [92 %-100 %] 94 % (09/07 1122) Weight:  [67.8 kg-70.9 kg] 67.8 kg (09/07 0426) Last BM Date : 06/07/24  Intake/Output from previous day: 09/06 0701 - 09/07 0700 In: 585.4 [P.O.:480; I.V.:5.6; IV Piggyback:99.9] Out: 2350 [Urine:2350] Intake/Output this shift: Total I/O In: -  Out: 700 [Urine:700]  PE: Gen:  Alert, NAD, pleasant Card:  Regular rate and rhythm Pulm:  Normal effort Abd: Soft, non-tender, non-distended, unable to feel rectus hematoma on physical exam Skin: warm and dry, no rashes  Psych: A&Ox3   Lab Results:  Recent Labs    06/08/24 0219 06/08/24 0542 06/09/24 0204  WBC 11.4*  --  9.6  HGB 7.4* 7.4* 7.0*  HCT 22.2* 22.5* 21.4*  PLT 193  --  215   BMET Recent Labs    06/08/24 0219 06/09/24 0204  NA 132* 131*  K 3.8 3.7  CL 101 101  CO2 22 25  GLUCOSE 134* 99  BUN 20 14  CREATININE 0.76 0.65  CALCIUM  7.3* 7.4*   PT/INR Recent Labs    06/06/24 2020  LABPROT 15.4*  INR 1.2   CMP     Component Value Date/Time   NA 131 (L) 06/09/2024 0204   NA 137 12/13/2023 0944   K 3.7 06/09/2024 0204   CL 101 06/09/2024 0204   CO2 25 06/09/2024 0204   GLUCOSE 99 06/09/2024 0204   BUN 14 06/09/2024 0204   BUN 25 12/13/2023 0944   CREATININE 0.65 06/09/2024 0204   CREATININE 0.86 03/07/2016 1136   CALCIUM  7.4 (L) 06/09/2024 0204   PROT 5.1 (L) 06/06/2024 2020   PROT 6.5 09/11/2023 1221   ALBUMIN 1.7 (L) 06/06/2024 2020   ALBUMIN 4.2 09/11/2023 1221   AST 23 06/06/2024 2020   ALT 13 06/06/2024 2020   ALKPHOS 83 06/06/2024 2020   BILITOT 0.5 06/06/2024 2020   BILITOT 0.5 09/11/2023 1221   GFRNONAA >60  06/09/2024 0204   GFRNONAA 82 12/25/2014 0810   GFRAA 87 01/02/2020 1122   GFRAA >89 12/25/2014 0810   Lipase     Component Value Date/Time   LIPASE 27 05/26/2024 1218       Studies/Results: No results found.  Anti-infectives: Anti-infectives (From admission, onward)    Start     Dose/Rate Route Frequency Ordered Stop   06/07/24 2000  vancomycin  (VANCOREADY) IVPB 1250 mg/250 mL  Status:  Discontinued        1,250 mg 166.7 mL/hr over 90 Minutes Intravenous Every 24 hours 06/07/24 0700 06/08/24 1030   06/07/24 1000  ceFEPIme  (MAXIPIME ) 1 g in sodium chloride  0.9 % 100 mL IVPB  Status:  Discontinued        1 g 200 mL/hr over 30 Minutes Intravenous Every 12 hours 06/07/24 0700 06/07/24 0839   06/07/24 1000  ceFEPIme  (MAXIPIME ) 2 g in sodium chloride  0.9 % 100 mL IVPB        2 g 200 mL/hr over 30 Minutes Intravenous Every 8 hours 06/07/24 0839     06/06/24 2015  vancomycin  (VANCOCIN ) IVPB 1000 mg/200 mL premix        1,000 mg 200 mL/hr over 60  Minutes Intravenous  Once 06/06/24 2011 06/06/24 2150   06/06/24 2015  ceFEPIme  (MAXIPIME ) 2 g in sodium chloride  0.9 % 100 mL IVPB        2 g 200 mL/hr over 30 Minutes Intravenous  Once 06/06/24 2011 06/06/24 2050        Assessment/Plan  89 yo male admitted after dizziness with fall at home.  - Acute T11 vertebral fracture: Seen by ortho spine, recommending TLSO brace when mobilizing. - Rib fractures: pain control, pulmonary toilet - Rectus sheath collection: most likely a small hematoma. There is no overlying cellulitis on exam, and the patient does not have significant tenderness in the area. Hgb has remained stable since admission. No acute surgical needs. We will sign off. Call as needed.    LOS: 2 days   I reviewed nursing notes, hospitalist notes, last 24 h vitals and pain scores, last 48 h intake and output, last 24 h labs and trends, and last 24 h imaging results.  This care required straight-forward level of medical  decision making.   Almarie Pringle, [name] [facility] Please see Amion for pager number during day hours 7:00am-4:30pm

## 2024-06-09 NOTE — Progress Notes (Signed)
 PROGRESS NOTE  Ronald Kaiser FMW:[id number] DOB: 11/04/1933 DOA: 06/06/2024 PCP: Default, Provider, MD   LOS: 2 days   Brief narrative:  89 yo male with past medical history of CHF, CAD, hypertension, hyperlipidemia, obesity presented to the hospital with dizziness and was noted to be hypotensive.  Of note patient was recently discharged home on 828 for similar complaints.  On this presentation patient fell back and hit the back of the counter.  EMS was called in and patient was noted to be hypotensive.  Has history of incontinence.  In the ED IV fluids were given but patient had persistent hypotension so pulmonary team was consulted for vasopressors and patient was admitted to the ICU.  At this time patient has been stable and has been considered stable out of the ICU.    Assessment/Plan: Principal Problem:   Dizziness  Shock likely septic: Thought to be secondary to UTI.  Urine cultures negative stable.  On cefepime .  Will continue to complete 7-day course.  Vancomycin  has been discontinued.  MRSA screen negative.  HFrEF EF 25 to 30% with non-ST elevation MI type II. History of CAD with cardiomyopathy Patient received Levophed  initially and was put on midodrine .  Continue aspirin  and statins.  Patient has improved at this time.  At this time patient appears to be compensated.  Possible cystitis multiple bladder stones: Continue antibiotic.  Will need to follow-up with urology as outpatient.   Acute traumatic T11 vertebral fracture: Chronic T12 fracture: 3rd and 4th rib fracture: Supportive care for now.  Orthopedics was consulted and advised TS O brace.  No surgical intervention.  Continue PT OT evaluation.  Metastatic lesion in thoracic, lumbar, left ischial tuberosity, right eighth, left 3rd and 7th ribs: History of prostate cancer: - Hold Xtandi  for now.  Will resume it once over sepsis.   Left rectal wall hematoma: General Surgery was consulted.  Impression was abdominal wall  hematoma.  History of iron  deficiency anemia- Had iron  replacement in the past.   Hypophosphatemia: Hypomagnesemia: - Replace as needed.  Check levels in AM.  DVT prophylaxis: SCDs Start: 06/07/24 0211   Disposition: Uncertain at this time.  Follow-up PT OT recommendation  Status is: Inpatient Remains inpatient appropriate because: Pending improvement, PT evaluation, possible need for rehab    Code Status:     Code Status: Full Code  Family Communication: None at bedside  Consultants: PCCM Orthopedics  Procedures: TSLO brace Foley catheter insertion and removal  Anti-infectives:  Cefepime   Anti-infectives (From admission, onward)    Start     Dose/Rate Route Frequency Ordered Stop   06/07/24 2000  vancomycin  (VANCOREADY) IVPB 1250 mg/250 mL  Status:  Discontinued        1,250 mg 166.7 mL/hr over 90 Minutes Intravenous Every 24 hours 06/07/24 0700 06/08/24 1030   06/07/24 1000  ceFEPIme  (MAXIPIME ) 1 g in sodium chloride  0.9 % 100 mL IVPB  Status:  Discontinued        1 g 200 mL/hr over 30 Minutes Intravenous Every 12 hours 06/07/24 0700 06/07/24 0839   06/07/24 1000  ceFEPIme  (MAXIPIME ) 2 g in sodium chloride  0.9 % 100 mL IVPB        2 g 200 mL/hr over 30 Minutes Intravenous Every 8 hours 06/07/24 0839     06/06/24 2015  vancomycin  (VANCOCIN ) IVPB 1000 mg/200 mL premix        1,000 mg 200 mL/hr over 60 Minutes Intravenous  Once 06/06/24 2011 06/06/24 2150   06/06/24  2015  ceFEPIme  (MAXIPIME ) 2 g in sodium chloride  0.9 % 100 mL IVPB        2 g 200 mL/hr over 30 Minutes Intravenous  Once 06/06/24 2011 06/06/24 2050       Subjective: Today, patient was seen and examined at bedside.  Patient sitting up on the side of the bed.  Feels okay.  Denies any dizziness lightheadedness nausea vomiting fever or chills.  Denies overt dyspnea or congestion.  Objective: Vitals:   06/09/24 0712 06/09/24 0722  BP: (!) 97/55 (!) 149/93  Pulse: 85 68  Resp: 20 (!) 22  Temp:  98.1 F (36.7 C) 97.8 F (36.6 C)  SpO2: 93% 94%    Intake/Output Summary (Last 24 hours) at 06/09/2024 1028 Last data filed at 06/09/2024 0440 Gross per 24 hour  Intake 387.98 ml  Output 2350 ml  Net -1962.02 ml   Filed Weights   06/06/24 1912 06/08/24 1622 06/09/24 0426  Weight: 64.5 kg 70.9 kg 67.8 kg   Body mass index is 21.45 kg/m.   Physical Exam: GENERAL: Patient is alert awake and oriented. Not in obvious distress.  Thinly built, elderly male, HENT: No scleral pallor or icterus. Pupils equally reactive to light. Oral mucosa is moist NECK: is supple, no gross swelling noted. CHEST: Clear to auscultation. No crackles or wheezes.  Tenderness on the back. CVS: S1 and S2 heard, no murmur. ABDOMEN: Soft, non-tender, bowel sounds are present. EXTREMITIES: No edema. CNS: Cranial nerves are intact. No focal motor deficits. SKIN: warm and dry without rashes.  Data Review: I have personally reviewed the following laboratory data and studies,  CBC: Recent Labs  Lab 06/06/24 2020 06/07/24 0231 06/07/24 0544 06/07/24 0737 06/07/24 1358 06/07/24 1837 06/08/24 0219 06/08/24 0542 06/09/24 0204  WBC 10.7* 11.6* 13.4*  --   --   --  11.4*  --  9.6  NEUTROABS 10.3*  --   --   --   --   --   --   --   --   HGB 7.6* 7.6* 7.6*   < > 8.2* 7.6* 7.4* 7.4* 7.0*  HCT 23.2* 23.6* 23.2*   < > 25.6* 23.7* 22.2* 22.5* 21.4*  MCV 97.5 98.7 95.9  --   --   --  95.7  --  95.5  PLT 222 220 243  --   --   --  193  --  215   < > = values in this interval not displayed.   Basic Metabolic Panel: Recent Labs  Lab 06/06/24 2020 06/07/24 0231 06/08/24 0219 06/09/24 0204  NA 132* 133* 132* 131*  K 3.6 3.5 3.8 3.7  CL 98 100 101 101  CO2 24 24 22 25   GLUCOSE 112* 129* 134* 99  BUN 22 18 20 14   CREATININE 1.01 0.77 0.76 0.65  CALCIUM  8.0* 7.9* 7.3* 7.4*  MG  --  1.6*  --   --    Liver Function Tests: Recent Labs  Lab 06/06/24 2020  AST 23  ALT 13  ALKPHOS 83  BILITOT 0.5  PROT  5.1*  ALBUMIN 1.7*   No results for input(s): LIPASE, AMYLASE in the last 168 hours. No results for input(s): AMMONIA in the last 168 hours. Cardiac Enzymes: No results for input(s): CKTOTAL, CKMB, CKMBINDEX, TROPONINI in the last 168 hours. BNP (last 3 results) Recent Labs    05/26/24 1218  BNP 345.7*    ProBNP (last 3 results) No results for input(s): PROBNP in the last  8760 hours.  CBG: No results for input(s): GLUCAP in the last 168 hours. Recent Results (from the past 240 hours)  Blood Culture (routine x 2)     Status: None (Preliminary result)   Collection Time: 06/06/24  8:10 PM   Specimen: BLOOD  Result Value Ref Range Status   Specimen Description BLOOD RIGHT ANTECUBITAL  Final   Special Requests   Final    BOTTLES DRAWN AEROBIC AND ANAEROBIC Blood Culture adequate volume   Culture   Final    NO GROWTH 3 DAYS Performed at [facility], [address], [place], [place] [address]    Report Status PENDING  Incomplete  Blood Culture (routine x 2)     Status: None (Preliminary result)   Collection Time: 06/06/24  8:15 PM   Specimen: BLOOD RIGHT FOREARM  Result Value Ref Range Status   Specimen Description BLOOD RIGHT FOREARM  Final   Special Requests   Final    BOTTLES DRAWN AEROBIC AND ANAEROBIC Blood Culture adequate volume   Culture   Final    NO GROWTH 3 DAYS Performed at [facility], [address], [place], [place] [address]    Report Status PENDING  Incomplete  Resp panel by RT-PCR (RSV, Flu A&B, Covid) Anterior Nasal Swab     Status: None   Collection Time: 06/06/24  8:20 PM   Specimen: Anterior Nasal Swab  Result Value Ref Range Status   SARS Coronavirus 2 by RT PCR NEGATIVE NEGATIVE Final   Influenza A by PCR NEGATIVE NEGATIVE Final   Influenza B by PCR NEGATIVE NEGATIVE Final    Comment: (NOTE) The Xpert Xpress SARS-CoV-2/FLU/RSV plus assay is intended as an aid in the diagnosis of influenza from Nasopharyngeal swab  specimens and should not be used as a sole basis for treatment. Nasal washings and aspirates are unacceptable for Xpert Xpress SARS-CoV-2/FLU/RSV testing.  Fact Sheet for Patients: BloggerCourse.com  Fact Sheet for Healthcare Providers: SeriousBroker.it  This test is not yet approved or cleared by the [place]  FDA and has been authorized for detection and/or diagnosis of SARS-CoV-2 by FDA under an Emergency Use Authorization (EUA). This EUA will remain in effect (meaning this test can be used) for the duration of the COVID-19 declaration under Section 564(b)(1) of the Act, 21 U.S.C. section 360bbb-3(b)(1), unless the authorization is terminated or revoked.     Resp Syncytial Virus by PCR NEGATIVE NEGATIVE Final    Comment: (NOTE) Fact Sheet for Patients: BloggerCourse.com  Fact Sheet for Healthcare Providers: SeriousBroker.it  This test is not yet approved or cleared by the [place]  FDA and has been authorized for detection and/or diagnosis of SARS-CoV-2 by FDA under an Emergency Use Authorization (EUA). This EUA will remain in effect (meaning this test can be used) for the duration of the COVID-19 declaration under Section 564(b)(1) of the Act, 21 U.S.C. section 360bbb-3(b)(1), unless the authorization is terminated or revoked.  Performed at [facility], [address] [place], [place] [address]   Urine Culture     Status: None   Collection Time: 06/06/24  8:20 PM   Specimen: Urine, Random  Result Value Ref Range Status   Specimen Description URINE, RANDOM  Final   Special Requests NONE Reflexed from [id number]  Final   Culture   Final    NO GROWTH Performed at [facility], [address], [place], [place] [address]    Report Status 06/08/2024 FINAL  Final  MRSA  Next Gen by PCR, Nasal     Status: None   Collection Time: 06/07/24  4:43 AM    Specimen: Nasal Mucosa; Nasal Swab  Result Value Ref Range Status   MRSA by PCR Next Gen NOT DETECTED NOT DETECTED Final    Comment: (NOTE) The GeneXpert MRSA Assay (FDA approved for NASAL specimens only), is one component of a comprehensive MRSA colonization surveillance program. It is not intended to diagnose MRSA infection nor to guide or monitor treatment for MRSA infections. Test performance is not FDA approved in patients less than 58 years old. Performed at [facility], [address], [place], [place] [address]      Studies: No results found.    Ednah Hammock, MD  [facility] 06/09/2024  If 7PM-7AM, please contact night-coverage

## 2024-06-09 NOTE — Plan of Care (Signed)

## 2024-06-09 NOTE — Progress Notes (Signed)
   06/09/24 0900  Orthostatic Lying   BP- Lying (!) 85/58  Orthostatic Sitting  BP- Sitting 93/51  Orthostatic Standing at 0 minutes  BP- Standing at 0 minutes 100/59  Orthostatic Standing at 3 minutes  BP- Standing at 3 minutes 99/61   Orthostatic vitals recorded during OT eval. Pt denies symptoms throughout. RN administered vasopressors while pt in supine during session. See full OT note for further details.   Jaymian Bogart C, OT  Acute Rehabilitation Services Office [phone] Secure chat preferred

## 2024-06-09 NOTE — Evaluation (Signed)
 Occupational Therapy Evaluation Patient Details Name: Ronald Kaiser MRN: [id number] DOB: Mar 10, 1934 Today's Date: 06/09/2024   History of Present Illness   Pt is a 89 y.o. male admitted 9/4 for fall in home, found to be hypotensive. Recent admission 8/24 for similar episode. CT showed T11 compression fx; chronic T12 fx; L sided ribs 3-4; sclerotic lesions in thoracic and lumbar spine, ischial tuberosity, and ribs consistent with metastatic disease; rectal wall abscess.  Ortho recommending TLSO brace for OOB activity.  PMH: s/p colonoscopy and EGD 8/26, prostate Cancer, MI, HFrEF, HTN, HLD, CAD     Clinical Impressions Pt admitted based on above, and was seen based on problem list below. PTA pt was mod I with ADLs and IADLs. Today pt is requiring supervision for safety with ADLs to adhere to back precautions with compensatory strategies. Bed mobility and functional transfers are  s for safety with RW. Provided and reviewed educational handout on back precautions and compensatory strategies for ADLs. Educated pt on wearing schedule with back brace, pt verbalizing understanding. Orthostatics recorded pt negative and asymptomatic, see additional note for further details. Anticipate pt will progress to mod I, no follow up OT needs. OT will continue to follow acutely to reinforce education on back precautions with ADLs.        If plan is discharge home, recommend the following:   A little help with walking and/or transfers;A little help with bathing/dressing/bathroom;Assistance with cooking/housework;Assist for transportation;Help with stairs or ramp for entrance     Functional Status Assessment   Patient has had a recent decline in their functional status and demonstrates the ability to make significant improvements in function in a reasonable and predictable amount of time.     Equipment Recommendations   None recommended by OT      Precautions/Restrictions    Precautions Precautions: Fall;Back Precaution Booklet Issued: Yes (comment) Recall of Precautions/Restrictions: Intact Precaution/Restrictions Comments: Back primarily for comfort Required Braces or Orthoses: Spinal Brace Spinal Brace: Thoracolumbosacral orthotic;Applied in sitting position Restrictions Weight Bearing Restrictions Per Provider Order: No     Mobility Bed Mobility Overal bed mobility: Needs Assistance Bed Mobility: Rolling, Sidelying to Sit Rolling: Supervision Sidelying to sit: Supervision       General bed mobility comments: S for safety with cues to log roll, reliant on rails    Transfers Overall transfer level: Needs assistance Equipment used: Rolling walker (2 wheels) Transfers: Sit to/from Stand Sit to Stand: Supervision           General transfer comment: S for safety      Balance Overall balance assessment: Needs assistance Sitting-balance support: Feet supported, No upper extremity supported Sitting balance-Leahy Scale: Good     Standing balance support: Bilateral upper extremity supported, During functional activity Standing balance-Leahy Scale: Fair Standing balance comment: benefits from UE support         ADL either performed or assessed with clinical judgement   ADL Overall ADL's : Needs assistance/impaired Eating/Feeding: Independent   Grooming: Oral care;Supervision/safety;Standing Grooming Details (indicate cue type and reason): Cues for compensatory techniques         Upper Body Dressing : Set up;Sitting   Lower Body Dressing: Supervision/safety;Cueing for safety;Cueing for compensatory techniques Lower Body Dressing Details (indicate cue type and reason): Cueing for safety and compensatory strategies Toilet Transfer: Supervision/safety;Rolling walker (2 wheels);Regular Toilet;Ambulation   Toileting- Clothing Manipulation and Hygiene: Supervision/safety;Cueing for safety;Sit to/from stand;Cueing for compensatory  techniques Toileting - Clothing Manipulation Details (indicate cue type and reason): S  for safety to maintain back precautions     Functional mobility during ADLs: Supervision/safety;Rolling walker (2 wheels) General ADL Comments: Cueing for adhering to back precautions for comfort     Vision Baseline Vision/History: 1 Wears glasses Ability to See in Adequate Light: 0 Adequate Patient Visual Report: No change from baseline Vision Assessment?: No apparent visual deficits            Pertinent Vitals/Pain Pain Assessment Pain Assessment: Faces Faces Pain Scale: Hurts a little bit Pain Location: back Pain Descriptors / Indicators: Discomfort, Grimacing Pain Intervention(s): Monitored during session     Extremity/Trunk Assessment Upper Extremity Assessment Upper Extremity Assessment: Generalized weakness   Lower Extremity Assessment Lower Extremity Assessment: Defer to PT evaluation   Cervical / Trunk Assessment Cervical / Trunk Assessment: Kyphotic   Communication Communication Communication: Impaired Factors Affecting Communication: Hearing impaired   Cognition Arousal: Alert Behavior During Therapy: WFL for tasks assessed/performed Cognition: No apparent impairments       Following commands: Intact       Cueing  General Comments   Cueing Techniques: Verbal cues  Orthostatic vitals recorded. Pt negative and asymptomatic. RN administered vasopressors while pt in supine during session           Home Living Family/patient expects to be discharged to:: Private residence Living Arrangements: Spouse/significant other Available Help at Discharge: Family;Available 24 hours/day Type of Home: House Home Access: Other (comment)     Home Layout: One level     Bathroom Shower/Tub: Tub/shower unit;Walk-in shower   Bathroom Toilet: Handicapped height Bathroom Accessibility: Yes How Accessible: Accessible via walker Home Equipment: Rollator (4 wheels);Shower  seat;Grab bars - tub/shower;Hand held shower head          Prior Functioning/Environment Prior Level of Function : Independent/Modified Independent;Driving             Mobility Comments: Use of rollator ADLs Comments: independent in ADL, IADL, medication management; asssists wife as needed at home    OT Problem List: Decreased strength;Decreased activity tolerance;Impaired balance (sitting and/or standing);Decreased knowledge of use of DME or AE;Cardiopulmonary status limiting activity   OT Treatment/Interventions: Self-care/ADL training;Therapeutic exercise;Energy conservation;DME and/or AE instruction;Therapeutic activities;Patient/family education;Balance training      OT Goals(Current goals can be found in the care plan section)   Acute Rehab OT Goals Patient Stated Goal: To get better OT Goal Formulation: With patient Time For Goal Achievement: 06/12/24 Potential to Achieve Goals: Good   OT Frequency:  Min 2X/week       AM-PAC OT 6 Clicks Daily Activity     Outcome Measure Help from another person eating meals?: None Help from another person taking care of personal grooming?: A Little Help from another person toileting, which includes using toliet, bedpan, or urinal?: A Little Help from another person bathing (including washing, rinsing, drying)?: A Little Help from another person to put on and taking off regular upper body clothing?: A Little Help from another person to put on and taking off regular lower body clothing?: A Little 6 Click Score: 19   End of Session Equipment Utilized During Treatment: Rolling walker (2 wheels);Back brace Nurse Communication: Mobility status  Activity Tolerance: Patient tolerated treatment well Patient left: in chair;with call bell/phone within reach;with chair alarm set  OT Visit Diagnosis: Unsteadiness on feet (R26.81);Muscle weakness (generalized) (M62.81)                Time: 9079-8970 OT Time Calculation (min): 69  min Charges:  OT General Charges $OT Visit:  1 Visit OT Evaluation $OT Eval Moderate Complexity: 1 Mod OT Treatments $Self Care/Home Management : 38-52 mins  Adrianne BROCKS, OT  Acute Rehabilitation Services Office [phone] Secure chat preferred   Adrianne GORMAN Savers 06/09/2024, 10:39 AM

## 2024-06-09 NOTE — Evaluation (Signed)
 Physical Therapy Evaluation Patient Details Name: Ronald Kaiser MRN: [id number] DOB: [birth year] Today's Date: 06/09/2024  History of Present Illness  Pt is a 89 y.o. male admitted 9/4 for fall in home, found to be hypotensive. Recent admission 8/24 for similar episode. CT showed T11 compression fx; chronic T12 fx; L sided ribs 3-4; sclerotic lesions in thoracic and lumbar spine, ischial tuberosity, and ribs consistent with metastatic disease; rectal wall abscess.  Ortho recommending TLSO brace for OOB activity.  PMH: s/p colonoscopy and EGD 8/26, prostate Cancer, MI, HFrEF, HTN, HLD, CAD  Clinical Impression  Pt currently is presenting as Mod I for bed mobility, supervision for sit to stand and side stepping with RW. Extra time spent to day educating pt on TLSO and various ways to wear it as well as the positioning of his spine and why it is hitting him in the chin. Due to pt current functional status, home set up and available assistance at home recommending skilled physical therapy services 3x/week in order to address strength, balance and functional mobility to decrease risk for falls, injury and re-hospitalization.           If plan is discharge home, recommend the following: Help with stairs or ramp for entrance     Equipment Recommendations None recommended by PT     Functional Status Assessment Patient has had a recent decline in their functional status and demonstrates the ability to make significant improvements in function in a reasonable and predictable amount of time.     Precautions / Restrictions Precautions Precautions: Fall;Back Precaution Booklet Issued: No Recall of Precautions/Restrictions: Intact Precaution/Restrictions Comments: Back primarily for comfort Required Braces or Orthoses: Spinal Brace Spinal Brace: Thoracolumbosacral orthotic;Other (comment) Spinal Brace Comments: pt refused brace. Restrictions Weight Bearing Restrictions Per Provider Order: No       Mobility  Bed Mobility Overal bed mobility: Modified Independent Bed Mobility: Rolling, Sidelying to Sit Rolling: Modified independent (Device/Increase time), Used rails Sidelying to sit: Modified independent (Device/Increase time), HOB elevated       General bed mobility comments: HOB elevated about 20 degrees    Transfers Overall transfer level: Needs assistance Equipment used: Rolling walker (2 wheels) Transfers: Sit to/from Stand Sit to Stand: Supervision           General transfer comment: for safety, good hand placement. No increase in back pain.    Ambulation/Gait Ambulation/Gait assistance: Supervision   Assistive device: Rolling walker (2 wheels) Gait Pattern/deviations: Step-to pattern       General Gait Details: Pt declined further gait. Took side steps at bed at supervision level; no increase in pain.     Balance Overall balance assessment: Mild deficits observed, not formally tested       Pertinent Vitals/Pain Pain Assessment Pain Assessment: No/denies pain    Home Living Family/patient expects to be discharged to:: Private residence Living Arrangements: Spouse/significant other Available Help at Discharge: Family;Available 24 hours/day Type of Home: House Home Access: Other (comment)       Home Layout: One level Home Equipment: Rollator (4 wheels);Shower seat;Grab bars - tub/shower;Hand held shower head      Prior Function Prior Level of Function : Independent/Modified Independent;Driving             Mobility Comments: Use of rollator ADLs Comments: independent in ADL, IADL, medication management; asssists wife as needed at home     Extremity/Trunk Assessment   Upper Extremity Assessment Upper Extremity Assessment: Defer to OT evaluation    Lower Extremity  Assessment Lower Extremity Assessment: Generalized weakness    Cervical / Trunk Assessment Cervical / Trunk Assessment: Kyphotic  Communication    Communication Communication: Impaired Factors Affecting Communication: Hearing impaired    Cognition Arousal: Alert Behavior During Therapy: WFL for tasks assessed/performed   PT - Cognitive impairments: No apparent impairments       Following commands: Intact       Cueing Cueing Techniques: Verbal cues     General Comments General comments (skin integrity, edema, etc.): no dizziness on standing. Pt educatd within PT scope of practice why orthopedic ordered TLSO and was given demonstration of various ways to wear. Pt agreeable to try with straps under arms at chest level for next session. otherwise he does not like the brace due to kyphosis of the spine which causes TLSO to hit his chin.        Assessment/Plan    PT Assessment Patient needs continued PT services  PT Problem List Decreased strength;Decreased activity tolerance;Decreased balance;Decreased mobility;Decreased knowledge of use of DME;Decreased knowledge of precautions       PT Treatment Interventions DME instruction;Gait training;Stair training;Functional mobility training;Therapeutic activities;Therapeutic exercise;Balance training;Neuromuscular re-education;Patient/family education    PT Goals (Current goals can be found in the Care Plan section)  Acute Rehab PT Goals Patient Stated Goal: to return home PT Goal Formulation: With patient Time For Goal Achievement: 06/23/24 Potential to Achieve Goals: Good    Frequency Min 2X/week        AM-PAC PT 6 Clicks Mobility  Outcome Measure Help needed turning from your back to your side while in a flat bed without using bedrails?: None Help needed moving from lying on your back to sitting on the side of a flat bed without using bedrails?: None Help needed moving to and from a bed to a chair (including a wheelchair)?: A Little Help needed standing up from a chair using your arms (e.g., wheelchair or bedside chair)?: A Little Help needed to walk in hospital  room?: A Little Help needed climbing 3-5 steps with a railing? : A Little 6 Click Score: 20    End of Session Equipment Utilized During Treatment: Gait belt Activity Tolerance: Patient tolerated treatment well Patient left: in bed;with call bell/phone within reach;with bed alarm set Nurse Communication: Mobility status PT Visit Diagnosis: Other abnormalities of gait and mobility (R26.89);Muscle weakness (generalized) (M62.81)    Time: 8442-8369 PT Time Calculation (min) (ACUTE ONLY): 33 min   Charges:   PT Evaluation $PT Eval Low Complexity: 1 Low PT Treatments $Therapeutic Activity: 8-22 mins PT General Charges $$ ACUTE PT VISIT: 1 Visit         Dorothyann Maier, DPT, CLT  Acute Rehabilitation Services Office: [phone] (Secure chat preferred)   Dorothyann VEAR Maier 06/09/2024, 5:05 PM

## 2024-06-10 DIAGNOSIS — R42 Dizziness and giddiness: Secondary | ICD-10-CM | POA: Diagnosis not present

## 2024-06-10 LAB — BASIC METABOLIC PANEL WITH GFR
Anion gap: 10 (ref 5–15)
BUN: 11 mg/dL (ref 8–23)
CO2: 24 mmol/L (ref 22–32)
Calcium: 7.5 mg/dL — ABNORMAL LOW (ref 8.9–10.3)
Chloride: 101 mmol/L (ref 98–111)
Creatinine, Ser: 0.65 mg/dL (ref 0.61–1.24)
GFR, Estimated: >60 mL/min (ref >60)
Glucose, Bld: 101 mg/dL — ABNORMAL HIGH (ref 70–99)
Potassium: 3.8 mmol/L (ref 3.5–5.1)
Sodium: 135 mmol/L (ref 135–145)

## 2024-06-10 LAB — CBC
HCT: 21.7 % — ABNORMAL LOW (ref 39.0–52.0)
Hemoglobin: 7 g/dL — ABNORMAL LOW (ref 13.0–17.0)
MCH: 30.8 pg (ref 26.0–34.0)
MCHC: 32.3 g/dL (ref 30.0–36.0)
MCV: 95.6 fL (ref 80.0–100.0)
Platelets: 228 K/uL (ref 150–400)
RBC: 2.27 MIL/uL — ABNORMAL LOW (ref 4.22–5.81)
RDW: 15.5 % (ref 11.5–15.5)
WBC: 8.6 K/uL (ref 4.0–10.5)
nRBC: 0 % (ref 0.0–0.2)

## 2024-06-10 MED ORDER — MIDODRINE HCL 5 MG PO TABS: 5.0000 mg | ORAL_TABLET | Freq: Three times a day (TID) | ORAL | 0 refills | Status: DC

## 2024-06-10 MED ORDER — CEFADROXIL 500 MG PO CAPS: 500.0000 mg | ORAL_CAPSULE | Freq: Two times a day (BID) | ORAL | 0 refills | Status: AC

## 2024-06-10 NOTE — Progress Notes (Signed)
 Physical Therapy Treatment Patient Details Name: Ronald Kaiser MRN: [id number] DOB: 1934-08-30 Today's Date: 06/10/2024   History of Present Illness Pt is a 89 y.o. male admitted 9/4 for fall in home, found to be hypotensive. Recent admission 8/24 for similar episode. CT showed T11 compression fx; chronic T12 fx; L sided ribs 3-4; sclerotic lesions in thoracic and lumbar spine, ischial tuberosity, and ribs consistent with metastatic disease; rectal wall abscess.  Ortho recommending TLSO brace for OOB activity.  PMH: s/p colonoscopy and EGD 8/26, prostate Cancer, MI, HFrEF, HTN, HLD, CAD    PT Comments  Pt is progressing well towards goals. Pt is Mod I for bed mobility, sit to stand with RW requiring 1x Min A from low toilet but pt has elevated toilet seat with arm rests at home, Mod I for 140 ft of gait. Pt has kyphotic posture naturally due to curvature of the spine and is able to correct when cued for limited periods of time. Pt remains within a safe and appropriate distance from AD. Due to pt current functional status, home set up and available assistance at home recommending skilled physical therapy services 3x/week in order to address strength, balance and functional mobility to decrease risk for falls, injury and re-hospitalization.       If plan is discharge home, recommend the following: Help with stairs or ramp for entrance     Equipment Recommendations  None recommended by PT       Precautions / Restrictions Precautions Precautions: Fall;Back Precaution Booklet Issued: No Recall of Precautions/Restrictions: Intact Precaution/Restrictions Comments: Back primarily for comfort Required Braces or Orthoses: Spinal Brace Spinal Brace: Thoracolumbosacral orthotic;Other (comment) Spinal Brace Comments: Pt does not tolerate brace due to kyphotic posture despite chest piece in the lowest position hits pts chin and is very uncomfortable to patient. Restrictions Weight Bearing Restrictions  Per Provider Order: No     Mobility  Bed Mobility Overal bed mobility: Modified Independent Bed Mobility: Rolling, Sidelying to Sit, Sit to Sidelying Rolling: Modified independent (Device/Increase time), Used rails Sidelying to sit: Modified independent (Device/Increase time), HOB elevated     Sit to sidelying: Modified independent (Device/Increase time) General bed mobility comments: HOB elevated about 20 degrees    Transfers Overall transfer level: Needs assistance Equipment used: Rolling walker (2 wheels) Transfers: Sit to/from Stand, Bed to chair/wheelchair/BSC Sit to Stand: Modified independent (Device/Increase time), Min assist           General transfer comment: for safety, good hand placement. No increase in back pain. Pt did need Min A to get up off low toilet. Pt has elevated toilets with arm rests at home.    Ambulation/Gait Ambulation/Gait assistance: Modified independent (Device/Increase time) Gait Distance (Feet): 140 Feet Assistive device: Rolling walker (2 wheels) Gait Pattern/deviations: Step-through pattern, Decreased stride length   Gait velocity interpretation: >2.62 ft/sec, indicative of community ambulatory   General Gait Details: pt with kyphotic posture. Kyphotic throughout; occasional verbal cues for upright; pt able to moderately correct but spinal muscles fatigue and returns to kyphotic posture.       Balance Overall balance assessment: Mild deficits observed, not formally tested Sitting-balance support: Feet supported, No upper extremity supported Sitting balance-Leahy Scale: Good     Standing balance support: Bilateral upper extremity supported, During functional activity Standing balance-Leahy Scale: Good          Communication Communication Communication: Impaired Factors Affecting Communication: Hearing impaired  Cognition Arousal: Alert Behavior During Therapy: WFL for tasks assessed/performed   PT -  Cognitive impairments:  No apparent impairments     Following commands: Intact      Cueing Cueing Techniques: Verbal cues     General Comments General comments (skin integrity, edema, etc.): No dizziness on standing. HR 89 after gait. Pt educated again on TLSO within PT scope of practice and attempted placing it with straps under axillary area but was still very uncomfortable for pt and he was unable to tolerate due to kyphotic posture.      Pertinent Vitals/Pain Pain Assessment Pain Assessment: No/denies pain Pain Intervention(s): Monitored during session           PT Goals (current goals can now be found in the care plan section) Acute Rehab PT Goals Patient Stated Goal: to return home PT Goal Formulation: With patient Time For Goal Achievement: 06/23/24 Potential to Achieve Goals: Good Progress towards PT goals: Progressing toward goals    Frequency    Min 2X/week      PT Plan  Continue with current POC        AM-PAC PT 6 Clicks Mobility   Outcome Measure  Help needed turning from your back to your side while in a flat bed without using bedrails?: None Help needed moving from lying on your back to sitting on the side of a flat bed without using bedrails?: None Help needed moving to and from a bed to a chair (including a wheelchair)?: None Help needed standing up from a chair using your arms (e.g., wheelchair or bedside chair)?: None Help needed to walk in hospital room?: None Help needed climbing 3-5 steps with a railing? : A Little 6 Click Score: 23    End of Session Equipment Utilized During Treatment: Gait belt Activity Tolerance: Patient tolerated treatment well Patient left: in bed;with call bell/phone within reach;with bed alarm set Nurse Communication: Mobility status;Other (comment) (pt cannot tolertae TLSO, MD notified as well) PT Visit Diagnosis: Other abnormalities of gait and mobility (R26.89);Muscle weakness (generalized) (M62.81)     Time: 8846-8753 PT Time  Calculation (min) (ACUTE ONLY): 53 min  Charges:    $Gait Training: 8-22 mins $Therapeutic Activity: 38-52 mins PT General Charges $$ ACUTE PT VISIT: 1 Visit                     Dorothyann Maier, DPT, CLT  Acute Rehabilitation Services Office: [phone] (Secure chat preferred)    Dorothyann VEAR Maier 06/10/2024, 12:54 PM

## 2024-06-10 NOTE — Discharge Summary (Signed)
 Physician Discharge Summary  Ronald Kaiser FMW:[id number] DOB: 01-Nov-1933 DOA: 06/06/2024  PCP: Default, Provider, MD  Admit date: 06/06/2024 Discharge date: 06/10/2024  Admitted From: Home  Discharge disposition: Home Health   Recommendations for Outpatient Follow-Up:   Follow up with your primary care provider in one week.  Check CBC, BMP, magnesium  in the next visit Follow up with orthopedics in one month for spine fracture follow-up. Follow up with Cardiology as scheduled by the clinic.  Discharge Diagnosis:   Principal Problem:   Dizziness   Discharge Condition: Improved.  Diet recommendation: .  Regular.  Wound care: None.  Code status: Full.   History of Present Illness:   89 yo male with past medical history of CHF, CAD, hypertension, hyperlipidemia, obesity presented to the hospital with dizziness and was noted to be hypotensive.  Of note patient was recently discharged home on 8/28 for similar complaints.  On this presentation patient fell back and hit the back of the counter.  EMS was called in and patient was noted to be hypotensive.  Has history of incontinence.  In the ED, IV fluids were given but patient had persistent hypotension so pulmonary team was consulted for vasopressors and patient was admitted to the ICU.  Subsequently, patient has been stable and has been considered stable out of the ICU.    Hospital Course:   Following conditions were addressed during hospitalization as listed below,  Shock likely septic: Resolved at this time.  Thought to be secondary to UTI.  Urine cultures negative so far and patient received cefepime  during hospitalization.  Will continue cefadroxil  for next 4 days to complete 7-day course.  MRSA screen negative.   HFrEF EF 25 to 30% with non-ST elevation MI type II. History of CAD with cardiomyopathy Patient received Levophed  initially and was put on midodrine .  Continue aspirin  and statins.  Patient has improved at this  time.  Compensated at this time.  Continue midodrine  on discharge.  Follow-up with cardiology as outpatient.   Possible cystitis multiple bladder stones: Received antibiotic during hospitalization.  Will need to follow-up with urology as outpatient.   Acute traumatic T11 vertebral fracture: Chronic T12 fracture: 3rd and 4th rib fracture: Supportive care for now.  Orthopedics was consulted and advised TSLO brace.  No surgical intervention.  Continue PT OT evaluation.  Orthopedic okay regarding follow-up in a month or so.  Patient is not really compliant on wearing TSLO brace   Metastatic lesion in thoracic, lumbar, left ischial tuberosity, right eighth, left 3rd and 7th ribs: History of prostate cancer: Resume Xtandi  on discharge   Left rectal wall hematoma: General Surgery was consulted.  Impression was abdominal wall hematoma.  No active issues   History of iron  deficiency anemia- Had iron  replacement in the past.  He is supposed to go to iron  infusion clinic as outpatient and I have advised him to do that.  He is already on oral iron  at home.   Hypophosphatemia: Hypomagnesemia: Replenished while in the hospital  Debility weakness.  Patient was seen by physical therapy and recommend home health PT OT on discharge.  Disposition.  At this time, patient is stable for disposition home with outpatient PCP, orthopedics and cardiology follow-up.  Spoke with the patient's wife prior to discharge  Medical Consultants:   [facility] Orthopedics  Procedures:    TSLO brace Foley catheter insertion and removal  Subjective:   Today, patient was seen and examined at bedside.  Denies any nausea vomiting dizziness lightheadedness  shortness of breath or chest pain.  Wants to go home.  Noted tenderness on the back.  Discharge Exam:   Vitals:   06/10/24 0748 06/10/24 1118  BP: (!) 93/47 (!) 98/54  Pulse: 81 76  Resp: 19 20  Temp: (!) 97.5 F (36.4 C) 98 F (36.7 C)  SpO2: 97% 94%    Vitals:   06/10/24 0635 06/10/24 0700 06/10/24 0748 06/10/24 1118  BP: (!) 101/50 (!) 103/47 (!) 93/47 (!) 98/54  Pulse:   81 76  Resp: 20 20 19 20   Temp:   (!) 97.5 F (36.4 C) 98 F (36.7 C)  TempSrc:   Oral Oral  SpO2:   97% 94%  Weight:      Height:       Body mass index is 21.45 kg/m.  General: Alert awake, not in obvious distress, elderly male, Communicative, thinly built, HENT: pupils equally reacting to light, mild pallor noted oral mucosa is moist.  Chest:  Clear breath sounds.  Diminished breath sounds bilaterally. No crackles or wheezes.  CVS: S1 &S2 heard. No murmur.  Regular rate and rhythm. Abdomen: Soft, nontender, nondistended.  Bowel sounds are heard.   Extremities: No cyanosis, clubbing or edema.  Peripheral pulses are palpable. Psych: Alert, awake and oriented, normal mood CNS:  No cranial nerve deficits.  Power equal in all extremities.   Skin: Warm and dry.  No rashes noted.  The results of significant diagnostics from this hospitalization (including imaging, microbiology, ancillary and laboratory) are listed below for reference.     Diagnostic Studies:   CT CHEST ABDOMEN PELVIS W CONTRAST Result Date: 06/06/2024 CLINICAL DATA:  Clemens at home, with low back pain and low back impact. Hypotensive on evaluation by EMS at the scene. Malodorous urine also noted. EXAM: CT CHEST, ABDOMEN, AND PELVIS WITH CONTRAST CT THORACIC SPINE WITHOUT CONTRAST CT LUMBAR SPINE WITHOUT CONTRAST TECHNIQUE: Multidetector CT imaging of the chest, abdomen and pelvis was performed following the standard protocol during bolus administration of intravenous contrast. Multi detector CT imaging of the thoracic and lumbar spine was performed following the standard protocol without additional administration of contrast. Multiplanar CT image reconstructions were created and reviewed. RADIATION DOSE REDUCTION: This exam was performed according to the departmental dose-optimization program which  includes automated exposure control, adjustment of the mA and/or kV according to patient size and/or use of iterative reconstruction technique. CONTRAST:  75mL OMNIPAQUE  IOHEXOL  350 MG/ML SOLN COMPARISON:  Most recent is CT scan abdomen pelvis and reconstructions 05/26/2024, before that chest CT with IV contrast 03/04/2022. Also compared with CT abdomen and pelvis no contrast 07/30/2020. FINDINGS: CT CHEST FINDINGS Cardiovascular: Normal heart size. No pericardial effusion. Left main and three-vessel heavy calcific CAD again noted and LAD stenting. The aorta is tortuous with moderate to heavy calcific plaques without aneurysm, stenosis or dissection. There are mild calcific plaques in the great vessels without stenosis. Pulmonary arteries and veins are normal caliber. Mediastinum/Nodes: No adenopathy, mediastinal free air or fluid collections. Negative axillary spaces, thyroid gland. Tracheal ectasia without filling defect. Negative esophagus. Lungs/Pleura: There is posterior atelectasis. Scattered linear scar-like opacities in the bases and mild reticular biapical scarring. No consolidation, effusion or pneumothorax. Clear central airways. No appreciable nodules. Musculoskeletal: Remote severe T12 compression fracture with retropulsion. Acute fracture of T11 will be described below. There are acute nondisplaced fractures of the left anterolateral third and fourth ribs. There is increased osseous sclerosis in the posterolateral left seventh rib and in the anterolateral left third rib  and also in the posterior right eighth rib, concern for blastic metastases. There is additional new sclerosis likely due to metastasis in the spinous processes of T1 and T2. There is a sclerotic lesion in the T10 vertebral body new from 2 years ago also concerning for metastasis, another is seen in the L1 body measuring 1.2 cm also new and not seen in 2021 either. CT ABDOMEN PELVIS FINDINGS Hepatobiliary: No hepatic injury or  perihepatic hematoma. Gallbladder is unremarkable. No liver mass enhancement. Pancreas: No abnormality. Spleen: No abnormality. Adrenals/Urinary Tract: Adrenal glands are unremarkable. Kidneys are normal, without renal calculi, focal lesion, or hydronephrosis. There is mild generalized thickening of the bladder wall with multiple stones inferiorly, largest is 2.3 cm. Correlate clinically for cystitis. Stomach/Bowel: Unremarkable contracted appearance of the stomach. No small bowel obstruction or inflammation. An appendix is not seen. Uncomplicated sigmoid diverticulosis. The colon wall is normal in thickness until the rectum, which has become thickened since 05/26/2024 most likely due to proctitis. There is only a small amount of stool in the rectum. There is perirectal stranding drainage. Vascular/Lymphatic: Heavy aortoiliac calcific plaques. 3.0 cm fusiform infrarenal AAA. No dissection. No lymphadenopathy. Reproductive: He has had prior TURP procedure x2, most recently in 2021. No masses seen in prostate bed. Prostate XRT reportedly was also administered but there are no brachytherapy seeds. Other: Trace deep pelvic ascites. No free air. No incarcerated hernia. Musculoskeletal: Levoscoliosis and degenerative change lumbar spine with osteopenia. Sclerosis left ischial tuberosity consistent with metastatic disease. Small chronic sclerotic lesions right hemisacrum most likely bone islands. Within the low anterior pelvic left rectus muscle there is a rim enhancing hypodense collection, 1.9 x 1.6 cm on 4:106, 5.9 cm in height on 7:70, consistent with phlegmon, viscous abscess or hematoma. CT THORACIC SPINE WITHOUT CONTRAST Segmentation: Standard, with 12 rib-bearing thoracic type segments. Alignment: Chronic retropulsion and retrolisthesis at T12. Trace retrolisthesis of T11 also chronic. Vertebrae: Acute transverse oblique fracture extends through the T11 vertebral body from anterior to posterior, with slight  posterosuperior cortical buckling. Loss of anterior vertebral height is 10-15%, with about 5% loss of the posterior, with slight retropulsion at the level of the posterosuperior cortical buckling. No apparent involvement of the pedicles and posterior elements. No epidural hematoma is visible. There is a chronic severe compression fracture of the T12 vertebral body, with a severe retropulsion to the left. This was first seen on the PET-CT from 12/13/2022. There is severe effacement of the left hemicanal at T12, displacement of the distal cord over to the right and at least borderline compression of the distal cord. Other levels do not show significant bony or soft tissue encroachment on the thecal sac. No herniated discs are seen. Sclerotic lesions most likely due to metastases are noted in the T1 and T2 spinous processes and T10 vertebral body. No other fractures are seen. Above T11 the vertebrae are normal in height. Paraspinal abdominal soft tissues: Paraspinal edema noted at T10-11 without paraspinal hematoma or fluid collection. Disc levels: The discs are degenerated with no apparent herniation. No spinal canal hematoma is seen. There is mild spondylosis. High-grade narrowing particularly in the left hemicanal as above at T12 due to T12 retropulsion, chronic. There are slight facet spurs of the lower thoracic levels but no levels demonstrate critical foraminal stenosis. Other: None. CT LUMBAR SPINE WITHOUT CONTRAST Segmentation:Standard. Alignment:Mild levoscoliosis apex L4.  No AP listhesis. Vertebrae: No evidence of fractures. Sclerotic lesions compatible with metastases are noted with 2 in the L1 body. Osteopenia.  Paraspinal and other soft tissues:No hematoma, mass or fluid collection. Disc levels:Only the L1-2 disc is normal in height. Other discs are variably collapsed, greatest disc space loss at L4-5 and L5-S1. There is lumbar spondylosis. Moderate acquired spinal stenosis L4-5 due to posterior disc  osteophyte complex and posterior element hypertrophy. The spinous processes abut with opposing surface spurring L2-5, consistent with Baastrup's disease. No other notable thecal sac encroachment is seen. There are mild facet spurs at L3-4 and L5-S1 and moderate facet hypertrophy right greater than left L4-5. No critical foraminal stenosis. Other: The SI joints are patent. No sacral insufficiency fracture. Osteopenia. Linear sclerosis in the posterior right ilium adjacent the SI joints compatible with a healed fracture. IMPRESSION: 1. Acute transverse oblique fracture of the T11 vertebral body with slight posterosuperior cortical buckling and 10-15% loss of anterior vertebral height. No epidural hematoma is seen. 2. Chronic severe compression fracture of the T12 vertebral body with severe retropulsion to the left, severe effacement of the left hemicanal and at least borderline compression of the distal cord crowding it to the right. This was first seen on the PET-CT from 12/13/2022. 3. Acute nondisplaced fractures of the left anterolateral third and fourth ribs. 4. sclerotic lesions in the thoracic and lumbar spine, left ischial tuberosity, posterior right eighth and left third and seventh ribs, findings consistent with metastatic disease. 5. Elongate 1.9 x 1.6 x 5.9 cm rim enhancing hypodense collection in the low anterior pelvic left wall rectus muscle, consistent with phlegmon, viscous abscess or hematoma. 6. Probable cystitis with multiple bladder stones. 7. Likely proctitis new from 05/26/2024. 8. Aortic and coronary artery atherosclerosis. 9. 3.0 cm fusiform infrarenal AAA. Recommend follow-up every 3 years. 10. Osteopenia and degenerative change.  Lumbar scoliosis. Aortic Atherosclerosis (ICD10-I70.0). Electronically Signed   By: Francis Quam M.D.   On: 06/06/2024 23:36   CT L-SPINE NO CHARGE Result Date: 06/06/2024 CLINICAL DATA:  Clemens at home, with low back pain and low back impact. Hypotensive on  evaluation by EMS at the scene. Malodorous urine also noted. EXAM: CT CHEST, ABDOMEN, AND PELVIS WITH CONTRAST CT THORACIC SPINE WITHOUT CONTRAST CT LUMBAR SPINE WITHOUT CONTRAST TECHNIQUE: Multidetector CT imaging of the chest, abdomen and pelvis was performed following the standard protocol during bolus administration of intravenous contrast. Multi detector CT imaging of the thoracic and lumbar spine was performed following the standard protocol without additional administration of contrast. Multiplanar CT image reconstructions were created and reviewed. RADIATION DOSE REDUCTION: This exam was performed according to the departmental dose-optimization program which includes automated exposure control, adjustment of the mA and/or kV according to patient size and/or use of iterative reconstruction technique. CONTRAST:  75mL OMNIPAQUE  IOHEXOL  350 MG/ML SOLN COMPARISON:  Most recent is CT scan abdomen pelvis and reconstructions 05/26/2024, before that chest CT with IV contrast 03/04/2022. Also compared with CT abdomen and pelvis no contrast 07/30/2020. FINDINGS: CT CHEST FINDINGS Cardiovascular: Normal heart size. No pericardial effusion. Left main and three-vessel heavy calcific CAD again noted and LAD stenting. The aorta is tortuous with moderate to heavy calcific plaques without aneurysm, stenosis or dissection. There are mild calcific plaques in the great vessels without stenosis. Pulmonary arteries and veins are normal caliber. Mediastinum/Nodes: No adenopathy, mediastinal free air or fluid collections. Negative axillary spaces, thyroid gland. Tracheal ectasia without filling defect. Negative esophagus. Lungs/Pleura: There is posterior atelectasis. Scattered linear scar-like opacities in the bases and mild reticular biapical scarring. No consolidation, effusion or pneumothorax. Clear central airways. No appreciable nodules. Musculoskeletal: Remote  severe T12 compression fracture with retropulsion. Acute fracture  of T11 will be described below. There are acute nondisplaced fractures of the left anterolateral third and fourth ribs. There is increased osseous sclerosis in the posterolateral left seventh rib and in the anterolateral left third rib and also in the posterior right eighth rib, concern for blastic metastases. There is additional new sclerosis likely due to metastasis in the spinous processes of T1 and T2. There is a sclerotic lesion in the T10 vertebral body new from 2 years ago also concerning for metastasis, another is seen in the L1 body measuring 1.2 cm also new and not seen in 2021 either. CT ABDOMEN PELVIS FINDINGS Hepatobiliary: No hepatic injury or perihepatic hematoma. Gallbladder is unremarkable. No liver mass enhancement. Pancreas: No abnormality. Spleen: No abnormality. Adrenals/Urinary Tract: Adrenal glands are unremarkable. Kidneys are normal, without renal calculi, focal lesion, or hydronephrosis. There is mild generalized thickening of the bladder wall with multiple stones inferiorly, largest is 2.3 cm. Correlate clinically for cystitis. Stomach/Bowel: Unremarkable contracted appearance of the stomach. No small bowel obstruction or inflammation. An appendix is not seen. Uncomplicated sigmoid diverticulosis. The colon wall is normal in thickness until the rectum, which has become thickened since 05/26/2024 most likely due to proctitis. There is only a small amount of stool in the rectum. There is perirectal stranding drainage. Vascular/Lymphatic: Heavy aortoiliac calcific plaques. 3.0 cm fusiform infrarenal AAA. No dissection. No lymphadenopathy. Reproductive: He has had prior TURP procedure x2, most recently in 2021. No masses seen in prostate bed. Prostate XRT reportedly was also administered but there are no brachytherapy seeds. Other: Trace deep pelvic ascites. No free air. No incarcerated hernia. Musculoskeletal: Levoscoliosis and degenerative change lumbar spine with osteopenia. Sclerosis left  ischial tuberosity consistent with metastatic disease. Small chronic sclerotic lesions right hemisacrum most likely bone islands. Within the low anterior pelvic left rectus muscle there is a rim enhancing hypodense collection, 1.9 x 1.6 cm on 4:106, 5.9 cm in height on 7:70, consistent with phlegmon, viscous abscess or hematoma. CT THORACIC SPINE WITHOUT CONTRAST Segmentation: Standard, with 12 rib-bearing thoracic type segments. Alignment: Chronic retropulsion and retrolisthesis at T12. Trace retrolisthesis of T11 also chronic. Vertebrae: Acute transverse oblique fracture extends through the T11 vertebral body from anterior to posterior, with slight posterosuperior cortical buckling. Loss of anterior vertebral height is 10-15%, with about 5% loss of the posterior, with slight retropulsion at the level of the posterosuperior cortical buckling. No apparent involvement of the pedicles and posterior elements. No epidural hematoma is visible. There is a chronic severe compression fracture of the T12 vertebral body, with a severe retropulsion to the left. This was first seen on the PET-CT from 12/13/2022. There is severe effacement of the left hemicanal at T12, displacement of the distal cord over to the right and at least borderline compression of the distal cord. Other levels do not show significant bony or soft tissue encroachment on the thecal sac. No herniated discs are seen. Sclerotic lesions most likely due to metastases are noted in the T1 and T2 spinous processes and T10 vertebral body. No other fractures are seen. Above T11 the vertebrae are normal in height. Paraspinal abdominal soft tissues: Paraspinal edema noted at T10-11 without paraspinal hematoma or fluid collection. Disc levels: The discs are degenerated with no apparent herniation. No spinal canal hematoma is seen. There is mild spondylosis. High-grade narrowing particularly in the left hemicanal as above at T12 due to T12 retropulsion, chronic. There  are slight facet spurs of  the lower thoracic levels but no levels demonstrate critical foraminal stenosis. Other: None. CT LUMBAR SPINE WITHOUT CONTRAST Segmentation:Standard. Alignment:Mild levoscoliosis apex L4.  No AP listhesis. Vertebrae: No evidence of fractures. Sclerotic lesions compatible with metastases are noted with 2 in the L1 body. Osteopenia. Paraspinal and other soft tissues:No hematoma, mass or fluid collection. Disc levels:Only the L1-2 disc is normal in height. Other discs are variably collapsed, greatest disc space loss at L4-5 and L5-S1. There is lumbar spondylosis. Moderate acquired spinal stenosis L4-5 due to posterior disc osteophyte complex and posterior element hypertrophy. The spinous processes abut with opposing surface spurring L2-5, consistent with Baastrup's disease. No other notable thecal sac encroachment is seen. There are mild facet spurs at L3-4 and L5-S1 and moderate facet hypertrophy right greater than left L4-5. No critical foraminal stenosis. Other: The SI joints are patent. No sacral insufficiency fracture. Osteopenia. Linear sclerosis in the posterior right ilium adjacent the SI joints compatible with a healed fracture. IMPRESSION: 1. Acute transverse oblique fracture of the T11 vertebral body with slight posterosuperior cortical buckling and 10-15% loss of anterior vertebral height. No epidural hematoma is seen. 2. Chronic severe compression fracture of the T12 vertebral body with severe retropulsion to the left, severe effacement of the left hemicanal and at least borderline compression of the distal cord crowding it to the right. This was first seen on the PET-CT from 12/13/2022. 3. Acute nondisplaced fractures of the left anterolateral third and fourth ribs. 4. sclerotic lesions in the thoracic and lumbar spine, left ischial tuberosity, posterior right eighth and left third and seventh ribs, findings consistent with metastatic disease. 5. Elongate 1.9 x 1.6 x 5.9 cm rim  enhancing hypodense collection in the low anterior pelvic left wall rectus muscle, consistent with phlegmon, viscous abscess or hematoma. 6. Probable cystitis with multiple bladder stones. 7. Likely proctitis new from 05/26/2024. 8. Aortic and coronary artery atherosclerosis. 9. 3.0 cm fusiform infrarenal AAA. Recommend follow-up every 3 years. 10. Osteopenia and degenerative change.  Lumbar scoliosis. Aortic Atherosclerosis (ICD10-I70.0). Electronically Signed   By: Francis Quam M.D.   On: 06/06/2024 23:36   CT T-SPINE NO CHARGE Result Date: 06/06/2024 CLINICAL DATA:  Clemens at home, with low back pain and low back impact. Hypotensive on evaluation by EMS at the scene. Malodorous urine also noted. EXAM: CT CHEST, ABDOMEN, AND PELVIS WITH CONTRAST CT THORACIC SPINE WITHOUT CONTRAST CT LUMBAR SPINE WITHOUT CONTRAST TECHNIQUE: Multidetector CT imaging of the chest, abdomen and pelvis was performed following the standard protocol during bolus administration of intravenous contrast. Multi detector CT imaging of the thoracic and lumbar spine was performed following the standard protocol without additional administration of contrast. Multiplanar CT image reconstructions were created and reviewed. RADIATION DOSE REDUCTION: This exam was performed according to the departmental dose-optimization program which includes automated exposure control, adjustment of the mA and/or kV according to patient size and/or use of iterative reconstruction technique. CONTRAST:  75mL OMNIPAQUE  IOHEXOL  350 MG/ML SOLN COMPARISON:  Most recent is CT scan abdomen pelvis and reconstructions 05/26/2024, before that chest CT with IV contrast 03/04/2022. Also compared with CT abdomen and pelvis no contrast 07/30/2020. FINDINGS: CT CHEST FINDINGS Cardiovascular: Normal heart size. No pericardial effusion. Left main and three-vessel heavy calcific CAD again noted and LAD stenting. The aorta is tortuous with moderate to heavy calcific plaques without  aneurysm, stenosis or dissection. There are mild calcific plaques in the great vessels without stenosis. Pulmonary arteries and veins are normal caliber. Mediastinum/Nodes: No adenopathy, mediastinal free  air or fluid collections. Negative axillary spaces, thyroid gland. Tracheal ectasia without filling defect. Negative esophagus. Lungs/Pleura: There is posterior atelectasis. Scattered linear scar-like opacities in the bases and mild reticular biapical scarring. No consolidation, effusion or pneumothorax. Clear central airways. No appreciable nodules. Musculoskeletal: Remote severe T12 compression fracture with retropulsion. Acute fracture of T11 will be described below. There are acute nondisplaced fractures of the left anterolateral third and fourth ribs. There is increased osseous sclerosis in the posterolateral left seventh rib and in the anterolateral left third rib and also in the posterior right eighth rib, concern for blastic metastases. There is additional new sclerosis likely due to metastasis in the spinous processes of T1 and T2. There is a sclerotic lesion in the T10 vertebral body new from 2 years ago also concerning for metastasis, another is seen in the L1 body measuring 1.2 cm also new and not seen in 2021 either. CT ABDOMEN PELVIS FINDINGS Hepatobiliary: No hepatic injury or perihepatic hematoma. Gallbladder is unremarkable. No liver mass enhancement. Pancreas: No abnormality. Spleen: No abnormality. Adrenals/Urinary Tract: Adrenal glands are unremarkable. Kidneys are normal, without renal calculi, focal lesion, or hydronephrosis. There is mild generalized thickening of the bladder wall with multiple stones inferiorly, largest is 2.3 cm. Correlate clinically for cystitis. Stomach/Bowel: Unremarkable contracted appearance of the stomach. No small bowel obstruction or inflammation. An appendix is not seen. Uncomplicated sigmoid diverticulosis. The colon wall is normal in thickness until the rectum,  which has become thickened since 05/26/2024 most likely due to proctitis. There is only a small amount of stool in the rectum. There is perirectal stranding drainage. Vascular/Lymphatic: Heavy aortoiliac calcific plaques. 3.0 cm fusiform infrarenal AAA. No dissection. No lymphadenopathy. Reproductive: He has had prior TURP procedure x2, most recently in 2021. No masses seen in prostate bed. Prostate XRT reportedly was also administered but there are no brachytherapy seeds. Other: Trace deep pelvic ascites. No free air. No incarcerated hernia. Musculoskeletal: Levoscoliosis and degenerative change lumbar spine with osteopenia. Sclerosis left ischial tuberosity consistent with metastatic disease. Small chronic sclerotic lesions right hemisacrum most likely bone islands. Within the low anterior pelvic left rectus muscle there is a rim enhancing hypodense collection, 1.9 x 1.6 cm on 4:106, 5.9 cm in height on 7:70, consistent with phlegmon, viscous abscess or hematoma. CT THORACIC SPINE WITHOUT CONTRAST Segmentation: Standard, with 12 rib-bearing thoracic type segments. Alignment: Chronic retropulsion and retrolisthesis at T12. Trace retrolisthesis of T11 also chronic. Vertebrae: Acute transverse oblique fracture extends through the T11 vertebral body from anterior to posterior, with slight posterosuperior cortical buckling. Loss of anterior vertebral height is 10-15%, with about 5% loss of the posterior, with slight retropulsion at the level of the posterosuperior cortical buckling. No apparent involvement of the pedicles and posterior elements. No epidural hematoma is visible. There is a chronic severe compression fracture of the T12 vertebral body, with a severe retropulsion to the left. This was first seen on the PET-CT from 12/13/2022. There is severe effacement of the left hemicanal at T12, displacement of the distal cord over to the right and at least borderline compression of the distal cord. Other levels do not  show significant bony or soft tissue encroachment on the thecal sac. No herniated discs are seen. Sclerotic lesions most likely due to metastases are noted in the T1 and T2 spinous processes and T10 vertebral body. No other fractures are seen. Above T11 the vertebrae are normal in height. Paraspinal abdominal soft tissues: Paraspinal edema noted at T10-11 without paraspinal hematoma  or fluid collection. Disc levels: The discs are degenerated with no apparent herniation. No spinal canal hematoma is seen. There is mild spondylosis. High-grade narrowing particularly in the left hemicanal as above at T12 due to T12 retropulsion, chronic. There are slight facet spurs of the lower thoracic levels but no levels demonstrate critical foraminal stenosis. Other: None. CT LUMBAR SPINE WITHOUT CONTRAST Segmentation:Standard. Alignment:Mild levoscoliosis apex L4.  No AP listhesis. Vertebrae: No evidence of fractures. Sclerotic lesions compatible with metastases are noted with 2 in the L1 body. Osteopenia. Paraspinal and other soft tissues:No hematoma, mass or fluid collection. Disc levels:Only the L1-2 disc is normal in height. Other discs are variably collapsed, greatest disc space loss at L4-5 and L5-S1. There is lumbar spondylosis. Moderate acquired spinal stenosis L4-5 due to posterior disc osteophyte complex and posterior element hypertrophy. The spinous processes abut with opposing surface spurring L2-5, consistent with Baastrup's disease. No other notable thecal sac encroachment is seen. There are mild facet spurs at L3-4 and L5-S1 and moderate facet hypertrophy right greater than left L4-5. No critical foraminal stenosis. Other: The SI joints are patent. No sacral insufficiency fracture. Osteopenia. Linear sclerosis in the posterior right ilium adjacent the SI joints compatible with a healed fracture. IMPRESSION: 1. Acute transverse oblique fracture of the T11 vertebral body with slight posterosuperior cortical buckling  and 10-15% loss of anterior vertebral height. No epidural hematoma is seen. 2. Chronic severe compression fracture of the T12 vertebral body with severe retropulsion to the left, severe effacement of the left hemicanal and at least borderline compression of the distal cord crowding it to the right. This was first seen on the PET-CT from 12/13/2022. 3. Acute nondisplaced fractures of the left anterolateral third and fourth ribs. 4. sclerotic lesions in the thoracic and lumbar spine, left ischial tuberosity, posterior right eighth and left third and seventh ribs, findings consistent with metastatic disease. 5. Elongate 1.9 x 1.6 x 5.9 cm rim enhancing hypodense collection in the low anterior pelvic left wall rectus muscle, consistent with phlegmon, viscous abscess or hematoma. 6. Probable cystitis with multiple bladder stones. 7. Likely proctitis new from 05/26/2024. 8. Aortic and coronary artery atherosclerosis. 9. 3.0 cm fusiform infrarenal AAA. Recommend follow-up every 3 years. 10. Osteopenia and degenerative change.  Lumbar scoliosis. Aortic Atherosclerosis (ICD10-I70.0). Electronically Signed   By: Francis Quam M.D.   On: 06/06/2024 23:36   DG Chest Port 1 View Result Date: 06/06/2024 CLINICAL DATA:  Questionable sepsis - evaluate for abnormality EXAM: PORTABLE CHEST 1 VIEW COMPARISON:  05/26/2024 FINDINGS: The cardiomediastinal contours are stable. Aortic atherosclerosis. Coronary stent visualized. Pulmonary vasculature is normal. No consolidation, pleural effusion, or pneumothorax. 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 IMPRESSION: 1. No acute chest findings. 2. 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 Electronically Signed   By: Andrea Gasman M.D.   On: 06/06/2024 20:34     Labs:   Basic Metabolic Panel: Recent Labs  Lab 06/06/24 2020 06/07/24 0231 06/08/24 0219 06/09/24 0204 06/10/24 0319  NA 132* 133* 132* 131* 135  K 3.6 3.5 3.8 3.7 3.8  CL 98 100 101 101 101  CO2 24 24 22 25 24    GLUCOSE 112* 129* 134* 99 101*  BUN 22 18 20 14 11   CREATININE 1.01 0.77 0.76 0.65 0.65  CALCIUM  8.0* 7.9* 7.3* 7.4* 7.5*  MG  --  1.6*  --   --   --    GFR Estimated Creatinine Clearance: 62.4 mL/min (by C-G formula based on SCr of 0.65  mg/dL). Liver Function Tests: Recent Labs  Lab 06/06/24 2020  AST 23  ALT 13  ALKPHOS 83  BILITOT 0.5  PROT 5.1*  ALBUMIN 1.7*   No results for input(s): LIPASE, AMYLASE in the last 168 hours. No results for input(s): AMMONIA in the last 168 hours. Coagulation profile Recent Labs  Lab 06/06/24 2020  INR 1.2    CBC: Recent Labs  Lab 06/06/24 2020 06/07/24 0231 06/07/24 0544 06/07/24 0737 06/07/24 1837 06/08/24 0219 06/08/24 0542 06/09/24 0204 06/10/24 0319  WBC 10.7* 11.6* 13.4*  --   --  11.4*  --  9.6 8.6  NEUTROABS 10.3*  --   --   --   --   --   --   --   --   HGB 7.6* 7.6* 7.6*   < > 7.6* 7.4* 7.4* 7.0* 7.0*  HCT 23.2* 23.6* 23.2*   < > 23.7* 22.2* 22.5* 21.4* 21.7*  MCV 97.5 98.7 95.9  --   --  95.7  --  95.5 95.6  PLT 222 220 243  --   --  193  --  215 228   < > = values in this interval not displayed.   Cardiac Enzymes: No results for input(s): CKTOTAL, CKMB, CKMBINDEX, TROPONINI in the last 168 hours. BNP: Invalid input(s): POCBNP CBG: No results for input(s): GLUCAP in the last 168 hours. D-Dimer No results for input(s): DDIMER in the last 72 hours. Hgb A1c No results for input(s): HGBA1C in the last 72 hours. Lipid Profile No results for input(s): CHOL, HDL, LDLCALC, TRIG, CHOLHDL, LDLDIRECT in the last 72 hours. Thyroid function studies No results for input(s): TSH, T4TOTAL, T3FREE, THYROIDAB in the last 72 hours.  Invalid input(s): FREET3 Anemia work up No results for input(s): VITAMINB12, FOLATE, FERRITIN, TIBC, IRON , RETICCTPCT in the last 72 hours. Microbiology Recent Results (from the past 240 hours)  Blood Culture (routine x 2)     Status:  None (Preliminary result)   Collection Time: 06/06/24  8:10 PM   Specimen: BLOOD  Result Value Ref Range Status   Specimen Description BLOOD RIGHT ANTECUBITAL  Final   Special Requests   Final    BOTTLES DRAWN AEROBIC AND ANAEROBIC Blood Culture adequate volume   Culture   Final    NO GROWTH 4 DAYS Performed at [facility], [address], [place], [place] [address]    Report Status PENDING  Incomplete  Blood Culture (routine x 2)     Status: None (Preliminary result)   Collection Time: 06/06/24  8:15 PM   Specimen: BLOOD RIGHT FOREARM  Result Value Ref Range Status   Specimen Description BLOOD RIGHT FOREARM  Final   Special Requests   Final    BOTTLES DRAWN AEROBIC AND ANAEROBIC Blood Culture adequate volume   Culture   Final    NO GROWTH 4 DAYS Performed at [facility], [address] [place], [place] [address]    Report Status PENDING  Incomplete  Resp panel by RT-PCR (RSV, Flu A&B, Covid) Anterior Nasal Swab     Status: None   Collection Time: 06/06/24  8:20 PM   Specimen: Anterior Nasal Swab  Result Value Ref Range Status   SARS Coronavirus 2 by RT PCR NEGATIVE NEGATIVE Final   Influenza A by PCR NEGATIVE NEGATIVE Final   Influenza B by PCR NEGATIVE NEGATIVE Final    Comment: (NOTE) The Xpert Xpress SARS-CoV-2/FLU/RSV plus assay is intended as an aid in the diagnosis  of influenza from Nasopharyngeal swab specimens and should not be used as a sole basis for treatment. Nasal washings and aspirates are unacceptable for Xpert Xpress SARS-CoV-2/FLU/RSV testing.  Fact Sheet for Patients: BloggerCourse.com  Fact Sheet for Healthcare Providers: SeriousBroker.it  This test is not yet approved or cleared by the [place]  FDA and has been authorized for detection and/or diagnosis of SARS-CoV-2 by FDA under an Emergency Use Authorization (EUA). This EUA will remain in effect (meaning this test can be used) for  the duration of the COVID-19 declaration under Section 564(b)(1) of the Act, 21 U.S.C. section 360bbb-3(b)(1), unless the authorization is terminated or revoked.     Resp Syncytial Virus by PCR NEGATIVE NEGATIVE Final    Comment: (NOTE) Fact Sheet for Patients: BloggerCourse.com  Fact Sheet for Healthcare Providers: SeriousBroker.it  This test is not yet approved or cleared by the [place]  FDA and has been authorized for detection and/or diagnosis of SARS-CoV-2 by FDA under an Emergency Use Authorization (EUA). This EUA will remain in effect (meaning this test can be used) for the duration of the COVID-19 declaration under Section 564(b)(1) of the Act, 21 U.S.C. section 360bbb-3(b)(1), unless the authorization is terminated or revoked.  Performed at [facility], [address] [place], [place] [address]   Urine Culture     Status: None   Collection Time: 06/06/24  8:20 PM   Specimen: Urine, Random  Result Value Ref Range Status   Specimen Description URINE, RANDOM  Final   Special Requests NONE Reflexed from [id number]  Final   Culture   Final    NO GROWTH Performed at [facility], [address] [place], [place] [address]    Report Status 06/08/2024 FINAL  Final  MRSA Next Gen by PCR, Nasal     Status: None   Collection Time: 06/07/24  4:43 AM   Specimen: Nasal Mucosa; Nasal Swab  Result Value Ref Range Status   MRSA by PCR Next Gen NOT DETECTED NOT DETECTED Final    Comment: (NOTE) The GeneXpert MRSA Assay (FDA approved for NASAL specimens only), is one component of a comprehensive MRSA colonization surveillance program. It is not intended to diagnose MRSA infection nor to guide or monitor treatment for MRSA infections. Test performance is not FDA approved in patients less than 65 years old. Performed at [facility], [address] [place], [place] [address]      Discharge Instructions:    Discharge Instructions     Diet general   Complete by: As directed    Discharge instructions   Complete by: As directed    Follow-up with your primary care provider in 1 week.  Check blood work at that time.  Take medications as prescribed.  Seek medical attention for worsening symptoms.  Complete the course of antibiotic. Go to infusion center as scheduled. Wear elastic compression at home. Take precautions on standing up from lying down position.   Increase activity slowly   Complete by: As directed    No wound care   Complete by: As directed       Allergies as of 06/10/2024       Reactions   Bactrim [sulfamethoxazole-trimethoprim] Hives, Shortness Of Breath, Swelling   Augmentin [amoxicillin-pot Clavulanate] Diarrhea   Crestor  [rosuvastatin  Calcium ] Other (See Comments)   Back pain Grogginess  Dizziness    Zetia  [ezetimibe ] Other (See Comments)   Back pain Grogginess  Dizziness  Medication List     TAKE these medications    AMBULATORY NON FORMULARY MEDICATION Sucralfate 50 mg suppositories PR twice daily x 14 days   aspirin  EC 81 MG tablet Take 81 mg by mouth daily.   CALCIUM  + VITAMIN D3 PO Take 1 tablet by mouth daily.   cefadroxil  500 MG capsule Commonly known as: DURICEF Take 1 capsule (500 mg total) by mouth 2 (two) times daily for 4 days.   Centrum Silver Men 50+ Tabs Take 1 tablet by mouth daily.   ferrous sulfate  325 (65 FE) MG EC tablet Take 325 mg by mouth daily with breakfast.   furosemide  20 MG tablet Commonly known as: LASIX  Take 20 mg by mouth as needed for fluid or edema.   midodrine  5 MG tablet Commonly known as: PROAMATINE  Take 1 tablet (5 mg total) by mouth 3 (three) times daily.   nitroGLYCERIN  0.4 MG SL tablet Commonly known as: NITROSTAT  Place 1 tablet (0.4 mg total) under the tongue every 5 (five) minutes as needed.   pravastatin  20 MG tablet Commonly known as: PRAVACHOL  Take 1 tablet (20 mg total) by mouth every  evening.   Xtandi  40 MG capsule Generic drug: enzalutamide  Take 160 mg by mouth daily.        Follow-up Information     Primary care provider Follow up in 1 week(s).          Beuford Anes, MD Follow up in 1 month(s).   Specialty: Orthopedic Surgery Why: follow up of back Contact information: [address] [place] [address] [phone]                  Time coordinating discharge: 39 minutes  Signed:  Lexington Krotz  [facility] 06/10/2024, 4:17 PM

## 2024-06-10 NOTE — TOC Initial Note (Signed)
 Transition of Care [facility]) - Initial/Assessment Note    Patient Details  Name: Ronald Kaiser MRN: [id number] Date of Birth: [birth year]  Transition of Care [facility]) CM/SW Contact:    Waddell Barnie Rama, RN Phone Number: 06/10/2024, 10:44 AM  Clinical Narrative:                 From home with spouse, has PCP and insurance on file, states has [facility] services in place at this time not sure of name of agency, has rollator  at home.  States family member will transport them home at [employer] and family is support system, states gets medications from [employer].  Pta self ambulatory with rollator.   Patient gives this NCM permission to speak with his wife, he states she wants him to go to inpatient rehab.  NCM contacted wife, she states he needs rehab before he comes home.  He was just here 8/25 and went home and fell and she states she can not see , can not drive and can not cook so she can not be of assistance to him.   NCM will notify MD of this information.  Wife would like MD to contact her.   Expected Discharge Plan: Home w Home Health Services Barriers to Discharge: Continued Medical Work up   Patient Goals and CMS Choice Patient states their goals for this hospitalization and ongoing recovery are:: he states his wife wants him to go to [facility] CMS Medicare.gov Compare Post Acute Care list provided to:: Patient Choice offered to / list presented to : Patient      Expected Discharge Plan and Services In-house Referral: NA Discharge Planning Services: CM Consult Post Acute Care Choice: Home Health, Resumption of Svcs/PTA Provider Living arrangements for the past 2 months: Single Family Home Expected Discharge Date: 06/10/24               DME Arranged: N/A DME Agency: NA       HH Arranged: PT HH Agency: [facility] Date HH Agency Contacted: 06/10/24 Time HH Agency Contacted: 1043 Representative spoke with at [facility] Agency: Amy  Prior Living Arrangements/Services Living arrangements  for the past 2 months: Single Family Home Lives with:: Spouse Patient language and need for interpreter reviewed:: Yes Do you feel safe going back to the place where you live?: Yes      Need for Family Participation in Patient Care: Yes (Comment) Care giver support system in place?: No (comment) Current home services: DME (rollator) Criminal Activity/Legal Involvement Pertinent to Current Situation/Hospitalization: No - Comment as needed  Activities of Daily Living   ADL Screening (condition at time of admission) Independently performs ADLs?: Yes (appropriate for developmental age) Is the patient deaf or have difficulty hearing?: No Does the patient have difficulty seeing, even when wearing glasses/contacts?: Yes Does the patient have difficulty concentrating, remembering, or making decisions?: No  Permission Sought/Granted Permission sought to share information with : Case Manager, Family Supports, Other (comment) Permission granted to share information with : Yes, Verbal Permission Granted  Share Information with NAME: wife  Permission granted to share info w AGENCY: HH        Emotional Assessment Appearance:: Appears stated age Attitude/Demeanor/Rapport: Engaged Affect (typically observed): Appropriate Orientation: : Oriented to Self, Oriented to Place, Oriented to  Time, Oriented to Situation Alcohol / Substance Use: Not Applicable Psych Involvement: No (comment)  Admission diagnosis:  Dizziness [R42] Shock (HCC) [R57.9] Patient Active Problem List   Diagnosis Date Noted   Dizziness  06/07/2024   Paroxysmal A-fib (HCC) 05/30/2024   Radiation proctitis 05/28/2024   Gastric erythema 05/28/2024   Rectal bleeding 05/27/2024   Iron  deficiency anemia 05/27/2024   Symptomatic anemia 05/26/2024   Malignant neoplasm of prostate metastatic to bone (HCC) 02/26/2021   Bladder outlet obstruction 11/27/2018   Acute kidney injury (HCC)    Hypokalemia    Other hydronephrosis     Acute on chronic systolic CHF (congestive heart failure) (HCC) 11/11/2018   AKI (acute kidney injury) (HCC) 11/11/2018   Basal cell carcinoma (BCC) of skin of nose 01/24/2018   Abnormal nuclear cardiac imaging test    Actinic keratoses 01/13/2016   CHF (congestive heart failure) (HCC) 01/13/2016   Male erectile disorder 01/13/2016   Osteoarthritis 01/13/2016   Seborrheic keratoses 01/13/2016   Bruit 11/23/2012   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 [place] [address] Phone: [phone] Fax: [phone]  [facility] - [place], [place] - [address] [place] [address] Phone: [phone] Fax: [phone]     Social Drivers of Health (SDOH) Social History: SDOH Screenings   Food Insecurity: No Food Insecurity (06/08/2024)  Housing: Low Risk  (06/08/2024)  Transportation Needs: No Transportation Needs (06/08/2024)  Utilities: Not At Risk (06/08/2024)  Social Connections: Socially Integrated (06/08/2024)  Recent Concern: Social Connections - Moderately Isolated (06/07/2024)  Tobacco Use: Medium Risk (06/08/2024)   SDOH Interventions:     Readmission Risk Interventions    06/10/2024   10:37 AM  Readmission Risk Prevention Plan  Transportation Screening Complete  PCP or Specialist Appt within 3-5 Days Complete  HRI or Home Care Consult Complete  Palliative Care Screening Not Applicable  Medication Review (RN Care Manager) Complete

## 2024-06-10 NOTE — TOC Transition Note (Addendum)
 Transition of Care [facility]) - Discharge Note   Patient Details  Name: Ronald Kaiser MRN: [id number] Date of Birth: 02-22-1934  Transition of Care [facility]) CM/SW Contact:  Waddell Barnie Rama, RN Phone Number: 06/10/2024, 1:14 PM   Clinical Narrative:    Physical therapy came to see patient again and recs HHPT, HHOT, he is active with [facility], NCM notified Adriana with [facility].  Patient states a family member will transport him home.  Patient discussed with wife and NCM also spoke with wife, she is ok with him coming home with HHPT, HHOT.  Patient states he will be going home after he eats lunch.     Barriers to Discharge: Continued Medical Work up   Patient Goals and CMS Choice Patient states their goals for this hospitalization and ongoing recovery are:: he states his wife wants him to go to [facility] CMS Medicare.gov Compare Post Acute Care list provided to:: Patient Choice offered to / list presented to : Patient      Discharge Placement                       Discharge Plan and Services Additional resources added to the After Visit Summary for   In-house Referral: NA Discharge Planning Services: CM Consult Post Acute Care Choice: Home Health, Resumption of Svcs/PTA Provider          DME Arranged: N/A DME Agency: NA       HH Arranged: PT HH Agency: [facility] Date HH Agency Contacted: 06/10/24 Time HH Agency Contacted: 1043 Representative spoke with at [facility] Agency: Amy  Social Drivers of Health (SDOH) Interventions SDOH Screenings   Food Insecurity: No Food Insecurity (06/08/2024)  Housing: Low Risk  (06/08/2024)  Transportation Needs: No Transportation Needs (06/08/2024)  Utilities: Not At Risk (06/08/2024)  Social Connections: Socially Integrated (06/08/2024)  Recent Concern: Social Connections - Moderately Isolated (06/07/2024)  Tobacco Use: Medium Risk (06/08/2024)     Readmission Risk Interventions    06/10/2024   10:37 AM  Readmission Risk Prevention Plan   Transportation Screening Complete  PCP or Specialist Appt within 3-5 Days Complete  HRI or Home Care Consult Complete  Palliative Care Screening Not Applicable  Medication Review (RN Care Manager) Complete

## 2024-06-10 NOTE — Care Management Important Message (Signed)
 Important Message  Patient Details  Name: Ronald Kaiser MRN: [id number] Date of Birth: [birth year]   Important Message Given:  Yes - Medicare IM     [name] Sharps 06/10/2024, 11:59 AM

## 2024-06-11 DIAGNOSIS — D509 Iron deficiency anemia, unspecified: Secondary | ICD-10-CM | POA: Insufficient documentation

## 2024-06-11 LAB — TYPE AND SCREEN
ABO/RH(D): A POS
Antibody Screen: NEGATIVE
Unit division: 0

## 2024-06-11 LAB — BPAM RBC
Blood Product Expiration Date: 202509282359
Unit Type and Rh: 6200

## 2024-06-11 LAB — CULTURE, BLOOD (ROUTINE X 2)
Culture: NO GROWTH
Culture: NO GROWTH
Special Requests: ADEQUATE
Special Requests: ADEQUATE

## 2024-06-14 ENCOUNTER — Telehealth: Payer: Self-pay | Admitting: Cardiology

## 2024-06-14 NOTE — Telephone Encounter (Signed)
 Pt recently was released from hospital and pt's family would like a call from Dr. Vertie nurse to check up on patient. Please advise.

## 2024-06-14 NOTE — Telephone Encounter (Signed)
Spoke with pt wife, Aware of dr crenshaw's recommendations.  Follow up scheduled  

## 2024-06-14 NOTE — Telephone Encounter (Signed)
 Spoke with pt's wife per DPR. Pt's wife initially was confused and thought I was calling about physical therapy. When pt's wife was told that she had reached out to Dr. Vertie office with [facility], she stated she had not called us  but did have questions for us  anyway. Pt's wife is upset because the pt was taken off of Lisinopril  and Metoprolol  at the hospital and would like to know if he should be taking it. Pt's wife was asked if they had been taking the pt's blood pressures since he has been home from the hospital. Wife stated she had been taking it every day but they went to the family doctor today and their office took the readings. Wife stated she did not remember the readings except one that was 127/49. Pt's wife was told that her questions would be forwarded to Dr. Pietro and his nurse for review. Wife verbalized understanding. All questions if any were answered.

## 2024-06-17 ENCOUNTER — Ambulatory Visit (HOSPITAL_COMMUNITY)
Admission: RE | Admit: 2024-06-17 | Discharge: 2024-06-17 | Disposition: A | Discharge status: Home / Self Care | Source: Ambulatory Visit | Attending: General Surgery | Admitting: General Surgery

## 2024-06-17 ENCOUNTER — Inpatient Hospital Stay (HOSPITAL_COMMUNITY): Admission: RE | Admit: 2024-06-17 | Source: Ambulatory Visit

## 2024-06-17 VITALS — BP 106/55 | HR 80 | Temp 97.6°F | Resp 17

## 2024-06-17 DIAGNOSIS — D509 Iron deficiency anemia, unspecified: Secondary | ICD-10-CM | POA: Diagnosis present

## 2024-06-17 MED ORDER — SODIUM CHLORIDE 0.9 % IV SOLN
510.0000 mg | Freq: Once | INTRAVENOUS | Status: AC
  Administered 2024-06-17: 510 mg via INTRAVENOUS
  Filled 2024-06-17: qty 510

## 2024-06-23 NOTE — Progress Notes (Unsigned)
 Cardiology Office Note:  .   Date:  06/25/2024  ID:  Ronald Kaiser, DOB 08-26-1934, MRN [id number] PCP: Ronald Kaiser  [facility] Providers Cardiologist:  Ronald Shallow, MD {  History of Present Illness: .   Ronald Kaiser is a 89 y.o. male with history of CAD with STEMI 2000 DES D2, ICM with reduced EF, PAF, hypertension, hyperlipidemia, prostate cancer.      ICM 01/2016 EF 30 to 35%, anteroseptal/apical hypokinesis 02/2016 abnormal Lexiscan  prompted LHC 03/2016 with mild to moderate LAD in-stent restenosis, 50% RCA.  Abnormal stress test felt to be related to scar tissue.  Medical management. 11/2018 EF 40 to 45% 09/2023 EF 30 to 35% 02/2024 EF 30 to 35% 06/2024 admission for undifferentiated shock.  EF 25 to 30%, subsequently decline 20 to 25%.  PAF Documented 11/2018 in the setting of hypokalemia.  Has not been on DOAC due to no recurrences.  CAD 03/2016 mild to moderate LAD in-stent restenosis.  50% RCA.     Patient with history of remote stenting to D2 with persistent ICM with reduced EF on average has been around 30 to 35% for many years. He was last seen 04/2024 and overall doing well and walking his dog 1-1.5 miles per day.  He was referred to EP for consideration of ICD.  ICD not implanted given advanced age and lack of data around primary prevention in this population.  Most recently patient admission 05/2024 for acute GI bleed secondary to severe radiation proctitis.  This was treated with APC.  Radiation changes prevented full colonoscopy.  EGD was negative.  Had issues with orthostatic hypotension throughout admission.  Thought related to polypharmacy and hypovolemia/and anemia chronically.   He was admitted again the 06/2024 for an episode of weakness and had fallen backwards onto a counter behind him and had sustained multiple back and rib fractures.  He was treated for shock but etiology not exactly known, thought to be septic.  He was treated for a UTI but  urine cultures did not grow anything.  Lactate was negative.  MRSA screen negative.  Did require ICU level care and was on Levophed  and eventually put on midodrine  at discharge.  EF during this admission 20 to 25% with multiple wall motion abnormalities with worsening EF compared to prior.  Cardiology was not consulted.  He saw his PCP 9/12 and doing well, repeated blood work that showed improvement in anemia.  Hemoglobin 8s.  Today patient presents for hospital follow-up.  He is accompanied with his younger brother.  They report that he has had no issues since hospitalization and overall doing well.  He has been getting routine IV iron  infusions.  In speaking with him he says that his admission 05/2024 he actually had an episode where he was walking and he completely lost consciousness and woke up looking up at the lights.  Denies any prodrome or dizziness that preceded the symptoms.  He had attributed episode to a medication that he thinks starts with an O, but it is not clear what this medication is and going through his previous records and verifying them with him he is still unclear what exactly this medication is.  He threw out all of these medications and flushed them down the toilet.  Does not have a great understanding of what he takes but reports compliancy with what he is taking.  Denies any recurrences of syncopal episode.  In regards to his most recent admission 06/2024 he denies  any prodrome or any dizziness or loss of consciousness with this.  Just reports weakness in his back against the counter as mentioned in the report.  He is minimally active, walks with a walker and struggles to ambulate short distances but still for the most part able to complete all of his ADLs without significant issues.  Denies any exertional symptoms with this, no chest pain, palpitations or any other below symptoms.  Additionally he was completely unaware of his history of heart failure.  Brother is going to spend more  time with him and help him with his medical stuff as patient does not have a great understanding of what is going on and/or what he is taking.  ROS: Denies: Chest pain, shortness of breath, orthopnea, peripheral edema, palpitations, decreased exercise intolerance, fatigue, lightheadedness.   Studies Reviewed: .         Risk Assessment/Calculations:             Physical Exam:   VS:  BP (!) 108/54 (BP Location: Left Arm, Patient Position: Sitting, Cuff Size: Normal)   Pulse 76   Ht 6' (1.829 m)   Wt 140 lb 12.8 oz (63.9 kg)   SpO2 96%   BMI 19.10 kg/m    Wt Readings from Last 3 Encounters:  06/24/24 140 lb 12.8 oz (63.9 kg)  06/09/24 149 lb 7.6 oz (67.8 kg)  05/29/24 142 lb 3.2 oz (64.5 kg)    GEN: Well nourished, well developed in no acute distress NECK: No JVD; No carotid bruits CARDIAC: RRR, no murmurs, rubs, gallops RESPIRATORY:  Clear to auscultation without rales, wheezing or rhonchi  ABDOMEN: Soft, non-tender, non-distended EXTREMITIES:  No edema; No deformity   ASSESSMENT AND PLAN: .    Syncope Reports episode with no prodrome prior to his admission 05/2024.  He really has no recollection exactly what happened just remembers waking up to bright lights.  Certainly could have been attributed to underlying GI bleed but with his history of ICM and ischemic disease that warrants further evaluation especially with his drop in EF.  EKGs reviewed around his hospitalization with no evidence of high AV grade blocks or pauses.  No documentation of any concerning arrhythmias on telemetry.  Echocardiogram without any significant valvular disease. Ordering 2-week live monitor  Shock Admission 06/2024.  Presumed to be septic but I do not think this is entirely clear.  It has been related to his UTI but UTI without any cultures and he did not have an elevated lactic, but did require ICU level care and placed on Levophed  and discharged on midodrine . EF shows further decline during admission  and around 20 to 25% which potentially could have been concerning for cardiogenic shock.  ICM with reduced EF -09/2023 EF 30 to 35% -02/2024 EF 30 to 35% -05/2024 admission for undifferentiated shock.  EF 25 to 30%, subsequently decline 20 to 25%. Reduced EF noted since 2017 has been persistently depressed around 30 to 35% with further decline, currently 20 to 25%.  Fortunately does very well with NYHA class I/II symptoms.  He is euvolemic and surprisingly asymptomatic despite his severity of heart failure. GDMT is limited by hypotension requiring midodrine .  No SGLT2 inhibitor due to history of frequent UTIs with recent admission, no beta-blocker with concerns of shock.  BP too soft for ARB or ARNI.  May be able to tolerate low-dose spironolactone  12.5 mg in the future, but has hard issues tolerating this in the past based off of notes. Not a  candidate for ICD placement given advanced age per EP. On as needed Lasix  20 mg.,  Rarely uses this and has only used it once. Not sure if he would benefit from advanced heart failure referral with advanced age and severity of prostate cancer.  Will discuss with MD. His overall approach is more conservative but brother urges him to be amenable to more things. Since were not titrating GDMT and overall doing well repeat echocardiogram likely not beneficial currently.  CAD DES D2 2000 Hyperlipidemia -LHC 6 10/2015 with mild to moderate LAD in-stent restenosis, 50% RCA. Medical management. Stable disease.  No anginal symptoms or equivalents. Continue with aspirin  and pravastatin  20 mg.  Has not had a lipid panel checked in about a year.  Will repeat when he is fasted.  PAF Remote history in 2020.  No documented recurrences in patient has previously declined anticoagulation.  Prostate cancer Weight loss? Admission 05/2024 due to severe radiation proctitis.  Unclear prognosis.  I am unclear of Xtandi 's risk with underlying ICM.  Will defer to cardiologist if  this is of enough concern where it needs to be stopped.  Hypotension He is requiring midodrine  5 mg 3 times daily.  Blood pressure seems stable between 100-120 systolic, we reviewed his BP log.   GOC Severe heart failure with advanced age and prostate cancer with multiple 2 significant admissions in the past 2 months or so.  Clinically does much better than expected but overall prognosis not great. Brother is taking more initiative to help him with his medical care.    Dispo: 52-month follow-up with Dr. Pietro  Signed, Thom LITTIE Sluder, [name]

## 2024-06-24 ENCOUNTER — Other Ambulatory Visit

## 2024-06-24 ENCOUNTER — Ambulatory Visit: Attending: Physician Assistant | Admitting: Cardiology

## 2024-06-24 VITALS — BP 108/54 | HR 76 | Ht 72.0 in | Wt 140.8 lb

## 2024-06-24 DIAGNOSIS — I480 Paroxysmal atrial fibrillation: Secondary | ICD-10-CM | POA: Insufficient documentation

## 2024-06-24 DIAGNOSIS — R579 Shock, unspecified: Secondary | ICD-10-CM | POA: Insufficient documentation

## 2024-06-24 DIAGNOSIS — I255 Ischemic cardiomyopathy: Secondary | ICD-10-CM | POA: Diagnosis present

## 2024-06-24 DIAGNOSIS — R55 Syncope and collapse: Secondary | ICD-10-CM | POA: Diagnosis present

## 2024-06-24 DIAGNOSIS — I10 Essential (primary) hypertension: Secondary | ICD-10-CM | POA: Insufficient documentation

## 2024-06-24 DIAGNOSIS — E785 Hyperlipidemia, unspecified: Secondary | ICD-10-CM | POA: Diagnosis present

## 2024-06-24 DIAGNOSIS — I2510 Atherosclerotic heart disease of native coronary artery without angina pectoris: Secondary | ICD-10-CM | POA: Insufficient documentation

## 2024-06-24 NOTE — Progress Notes (Unsigned)
Enrolled for [employer] to mail a ZIO AT Live Telemetry monitor to patients address on file.  Dr. Crenshaw to read.  

## 2024-06-24 NOTE — Patient Instructions (Signed)
 Thank you for choosing [facility]!     Medication Instructions:  No medication changes were made during today's visit.  *If you need a refill on your cardiac medications before your next appointment, please call your pharmacy*   Lab Work: Return in one month for LIPID panel If you have labs (blood work) drawn today and your tests are completely normal, you will receive your results only by: MyChart Message (if you have MyChart) OR A paper copy in the mail If you have any lab test that is abnormal or we need to change your treatment, we will call you to review the results.   Testing/Procedures:  ZIO XT- Long Term Monitor Instructions   Your physician has requested you wear your ZIO patch monitor___14____days.   This is a single patch monitor.  [employer] supplies one patch monitor per enrollment.  Additional stickers are not available.   Please do not apply patch if you will be having a Nuclear Stress Test, Echocardiogram, Cardiac CT, MRI, or Chest Xray during the time frame you would be wearing the monitor. The patch cannot be worn during these tests.  You cannot remove and re-apply the ZIO XT patch monitor.   Your ZIO patch monitor will be sent USPS Priority mail from [employer] directly to your home address. The monitor may also be mailed to a PO BOX if home delivery is not available.   It may take 3-5 days to receive your monitor after you have been enrolled.   Once you have received you monitor, please review enclosed instructions.  Your monitor has already been registered assigning a specific monitor serial # to you.   Applying the monitor   Shave hair from upper left chest.   Hold abrader disc by orange tab.  Rub abrader in 40 strokes over left upper chest as indicated in your monitor instructions.   Clean area with 4 enclosed alcohol pads .  Use all pads to assure are is cleaned thoroughly.  Let dry.   Apply patch as indicated in monitor instructions.   Patch will be place under collarbone on left side of chest with arrow pointing upward.   Rub patch adhesive wings for 2 minutes.Remove white label marked 1.  Remove white label marked 2.  Rub patch adhesive wings for 2 additional minutes.   While looking in a mirror, press and release button in center of patch.  A small green light will flash 3-4 times .  This will be your only indicator the monitor has been turned on.     Do not shower for the first 24 hours.  You may shower after the first 24 hours.   Press button if you feel a symptom. You will hear a small click.  Record Date, Time and Symptom in the Patient Log Book.   When you are ready to remove patch, follow instructions on last 2 pages of Patient Log Book.  Stick patch monitor onto last page of Patient Log Book.   Place Patient Log Book in [place] box.  Use locking tab on box and tape box closed securely.  The [employer] has [employer] on it.  Please place in mailbox as soon as possible.  Your physician should have your test results approximately 7 days after the monitor has been mailed back to [facility].   Call [facility] Customer Care at [phone] if you have questions regarding your ZIO XT patch monitor.  Call them immediately if you see an  orange light blinking on your monitor.   If your monitor falls off in less than 4 days contact our Monitor department at [phone].  If your monitor becomes loose or falls off after 4 days call [employer] at [phone] for suggestions on securing your monitor.    Your next appointment:   2 month(s)   Provider:   Redell Shallow, MD or Thom Sluder     Follow-Up: At [facility], you and your health needs are our priority.  As part of our continuing mission to provide you with exceptional heart care, we have created designated Provider Care Teams.  These Care Teams include your primary Cardiologist (physician) and Advanced Practice Providers (APPs -   Physician Assistants and Nurse Practitioners) who all work together to provide you with the care you need, when you need it. We recommend signing up for the patient portal called MyChart.  Sign up information is provided on this After Visit Summary.  MyChart is used to connect with patients for Virtual Visits (Telemedicine).  Patients are able to view lab/test results, encounter notes, upcoming appointments, etc.  Non-urgent messages can be sent to your provider as well.   To learn more about what you can do with MyChart, go to [email or web address].

## 2024-07-10 ENCOUNTER — Other Ambulatory Visit: Payer: Self-pay

## 2024-07-10 ENCOUNTER — Encounter (HOSPITAL_COMMUNITY): Payer: Self-pay

## 2024-07-10 ENCOUNTER — Emergency Department (HOSPITAL_COMMUNITY)

## 2024-07-10 ENCOUNTER — Inpatient Hospital Stay (HOSPITAL_COMMUNITY)
Admission: EM | Admit: 2024-07-10 | Discharge: 2024-07-23 | DRG: 853 | Disposition: A | Discharge status: Home Health | Attending: Internal Medicine | Admitting: Internal Medicine

## 2024-07-10 DIAGNOSIS — D509 Iron deficiency anemia, unspecified: Secondary | ICD-10-CM | POA: Diagnosis present

## 2024-07-10 DIAGNOSIS — N4289 Other specified disorders of prostate: Secondary | ICD-10-CM | POA: Diagnosis present

## 2024-07-10 DIAGNOSIS — Z923 Personal history of irradiation: Secondary | ICD-10-CM

## 2024-07-10 DIAGNOSIS — R935 Abnormal findings on diagnostic imaging of other abdominal regions, including retroperitoneum: Secondary | ICD-10-CM

## 2024-07-10 DIAGNOSIS — R188 Other ascites: Secondary | ICD-10-CM | POA: Diagnosis present

## 2024-07-10 DIAGNOSIS — Z888 Allergy status to other drugs, medicaments and biological substances status: Secondary | ICD-10-CM

## 2024-07-10 DIAGNOSIS — R296 Repeated falls: Secondary | ICD-10-CM | POA: Diagnosis present

## 2024-07-10 DIAGNOSIS — Z955 Presence of coronary angioplasty implant and graft: Secondary | ICD-10-CM

## 2024-07-10 DIAGNOSIS — I110 Hypertensive heart disease with heart failure: Secondary | ICD-10-CM | POA: Diagnosis present

## 2024-07-10 DIAGNOSIS — I2489 Other forms of acute ischemic heart disease: Secondary | ICD-10-CM | POA: Diagnosis present

## 2024-07-10 DIAGNOSIS — E785 Hyperlipidemia, unspecified: Secondary | ICD-10-CM | POA: Diagnosis present

## 2024-07-10 DIAGNOSIS — I252 Old myocardial infarction: Secondary | ICD-10-CM

## 2024-07-10 DIAGNOSIS — I255 Ischemic cardiomyopathy: Secondary | ICD-10-CM | POA: Diagnosis present

## 2024-07-10 DIAGNOSIS — Z8679 Personal history of other diseases of the circulatory system: Secondary | ICD-10-CM

## 2024-07-10 DIAGNOSIS — R6520 Severe sepsis without septic shock: Secondary | ICD-10-CM | POA: Diagnosis present

## 2024-07-10 DIAGNOSIS — H3530 Unspecified macular degeneration: Secondary | ICD-10-CM | POA: Diagnosis present

## 2024-07-10 DIAGNOSIS — Z9079 Acquired absence of other genital organ(s): Secondary | ICD-10-CM

## 2024-07-10 DIAGNOSIS — Z87891 Personal history of nicotine dependence: Secondary | ICD-10-CM

## 2024-07-10 DIAGNOSIS — M868X8 Other osteomyelitis, other site: Secondary | ICD-10-CM | POA: Diagnosis present

## 2024-07-10 DIAGNOSIS — C7951 Secondary malignant neoplasm of bone: Secondary | ICD-10-CM | POA: Diagnosis present

## 2024-07-10 DIAGNOSIS — Z8551 Personal history of malignant neoplasm of bladder: Secondary | ICD-10-CM

## 2024-07-10 DIAGNOSIS — K651 Peritoneal abscess: Secondary | ICD-10-CM | POA: Diagnosis present

## 2024-07-10 DIAGNOSIS — Z883 Allergy status to other anti-infective agents status: Secondary | ICD-10-CM

## 2024-07-10 DIAGNOSIS — K219 Gastro-esophageal reflux disease without esophagitis: Secondary | ICD-10-CM | POA: Diagnosis present

## 2024-07-10 DIAGNOSIS — E43 Unspecified severe protein-calorie malnutrition: Secondary | ICD-10-CM | POA: Diagnosis present

## 2024-07-10 DIAGNOSIS — A4159 Other Gram-negative sepsis: Secondary | ICD-10-CM | POA: Diagnosis not present

## 2024-07-10 DIAGNOSIS — C61 Malignant neoplasm of prostate: Secondary | ICD-10-CM | POA: Diagnosis present

## 2024-07-10 DIAGNOSIS — I4821 Permanent atrial fibrillation: Secondary | ICD-10-CM | POA: Diagnosis present

## 2024-07-10 DIAGNOSIS — I700 Atherosclerosis of aorta: Secondary | ICD-10-CM | POA: Diagnosis present

## 2024-07-10 DIAGNOSIS — D62 Acute posthemorrhagic anemia: Secondary | ICD-10-CM | POA: Diagnosis present

## 2024-07-10 DIAGNOSIS — I7143 Infrarenal abdominal aortic aneurysm, without rupture: Secondary | ICD-10-CM | POA: Diagnosis present

## 2024-07-10 DIAGNOSIS — A419 Sepsis, unspecified organism: Principal | ICD-10-CM

## 2024-07-10 DIAGNOSIS — Z881 Allergy status to other antibiotic agents status: Secondary | ICD-10-CM

## 2024-07-10 DIAGNOSIS — W19XXXA Unspecified fall, initial encounter: Secondary | ICD-10-CM | POA: Diagnosis present

## 2024-07-10 DIAGNOSIS — I480 Paroxysmal atrial fibrillation: Secondary | ICD-10-CM | POA: Diagnosis present

## 2024-07-10 DIAGNOSIS — Z9181 History of falling: Secondary | ICD-10-CM

## 2024-07-10 DIAGNOSIS — Z681 Body mass index (BMI) 19 or less, adult: Secondary | ICD-10-CM

## 2024-07-10 DIAGNOSIS — I5020 Unspecified systolic (congestive) heart failure: Secondary | ICD-10-CM | POA: Insufficient documentation

## 2024-07-10 DIAGNOSIS — N136 Pyonephrosis: Secondary | ICD-10-CM | POA: Diagnosis present

## 2024-07-10 DIAGNOSIS — S3011XA Contusion of abdominal wall, initial encounter: Secondary | ICD-10-CM | POA: Diagnosis present

## 2024-07-10 DIAGNOSIS — I10 Essential (primary) hypertension: Secondary | ICD-10-CM | POA: Diagnosis present

## 2024-07-10 DIAGNOSIS — L02211 Cutaneous abscess of abdominal wall: Secondary | ICD-10-CM | POA: Diagnosis not present

## 2024-07-10 DIAGNOSIS — I2510 Atherosclerotic heart disease of native coronary artery without angina pectoris: Secondary | ICD-10-CM | POA: Diagnosis present

## 2024-07-10 DIAGNOSIS — Z7982 Long term (current) use of aspirin: Secondary | ICD-10-CM

## 2024-07-10 DIAGNOSIS — Z79899 Other long term (current) drug therapy: Secondary | ICD-10-CM

## 2024-07-10 DIAGNOSIS — Z6820 Body mass index (BMI) 20.0-20.9, adult: Secondary | ICD-10-CM

## 2024-07-10 DIAGNOSIS — J9811 Atelectasis: Secondary | ICD-10-CM | POA: Diagnosis present

## 2024-07-10 DIAGNOSIS — I495 Sick sinus syndrome: Secondary | ICD-10-CM | POA: Diagnosis present

## 2024-07-10 DIAGNOSIS — D630 Anemia in neoplastic disease: Secondary | ICD-10-CM | POA: Diagnosis present

## 2024-07-10 DIAGNOSIS — E872 Acidosis, unspecified: Secondary | ICD-10-CM | POA: Diagnosis present

## 2024-07-10 DIAGNOSIS — I5023 Acute on chronic systolic (congestive) heart failure: Secondary | ICD-10-CM | POA: Diagnosis present

## 2024-07-10 DIAGNOSIS — I951 Orthostatic hypotension: Secondary | ICD-10-CM | POA: Diagnosis present

## 2024-07-10 DIAGNOSIS — B964 Proteus (mirabilis) (morganii) as the cause of diseases classified elsewhere: Secondary | ICD-10-CM | POA: Diagnosis present

## 2024-07-10 DIAGNOSIS — R7989 Other specified abnormal findings of blood chemistry: Secondary | ICD-10-CM | POA: Insufficient documentation

## 2024-07-10 DIAGNOSIS — M6008 Infective myositis, other site: Secondary | ICD-10-CM | POA: Diagnosis present

## 2024-07-10 DIAGNOSIS — Z8249 Family history of ischemic heart disease and other diseases of the circulatory system: Secondary | ICD-10-CM

## 2024-07-10 LAB — CBC WITH DIFFERENTIAL/PLATELET
Basophils Absolute: 0 K/uL (ref 0.0–0.1)
Basophils Relative: 0 %
Eosinophils Absolute: 0 K/uL (ref 0.0–0.5)
Eosinophils Relative: 0 %
HCT: 29 % — ABNORMAL LOW (ref 39.0–52.0)
Hemoglobin: 9.2 g/dL — ABNORMAL LOW (ref 13.0–17.0)
Lymphocytes Relative: 4 %
Lymphs Abs: 0.9 K/uL (ref 0.7–4.0)
MCH: 30.7 pg (ref 26.0–34.0)
MCHC: 31.7 g/dL (ref 30.0–36.0)
MCV: 96.7 fL (ref 80.0–100.0)
Monocytes Absolute: 2.1 K/uL — ABNORMAL HIGH (ref 0.1–1.0)
Monocytes Relative: 9 %
Neutro Abs: 20.2 K/uL — ABNORMAL HIGH (ref 1.7–7.7)
Neutrophils Relative %: 87 %
Platelets: UNDETERMINED K/uL (ref 150–400)
RBC: 3 MIL/uL — ABNORMAL LOW (ref 4.22–5.81)
RDW: 18.1 % — ABNORMAL HIGH (ref 11.5–15.5)
WBC: 23.2 K/uL — ABNORMAL HIGH (ref 4.0–10.5)
nRBC: 0 % (ref 0.0–0.2)

## 2024-07-10 LAB — COMPREHENSIVE METABOLIC PANEL WITH GFR
ALT: 17 U/L (ref 0–44)
AST: 22 U/L (ref 15–41)
Albumin: 1.8 g/dL — ABNORMAL LOW (ref 3.5–5.0)
Alkaline Phosphatase: 61 U/L (ref 38–126)
Anion gap: 13 (ref 5–15)
BUN: 41 mg/dL — ABNORMAL HIGH (ref 8–23)
CO2: 22 mmol/L (ref 22–32)
Calcium: 7.9 mg/dL — ABNORMAL LOW (ref 8.9–10.3)
Chloride: 98 mmol/L (ref 98–111)
Creatinine, Ser: 1.04 mg/dL (ref 0.61–1.24)
GFR, Estimated: >60 mL/min (ref >60)
Glucose, Bld: 128 mg/dL — ABNORMAL HIGH (ref 70–99)
Potassium: 4.2 mmol/L (ref 3.5–5.1)
Sodium: 133 mmol/L — ABNORMAL LOW (ref 135–145)
Total Bilirubin: 0.4 mg/dL (ref 0.0–1.2)
Total Protein: 6.2 g/dL — ABNORMAL LOW (ref 6.5–8.1)

## 2024-07-10 LAB — CBC
HCT: 25.2 % — ABNORMAL LOW (ref 39.0–52.0)
Hemoglobin: 8 g/dL — ABNORMAL LOW (ref 13.0–17.0)
MCH: 30.5 pg (ref 26.0–34.0)
MCHC: 31.7 g/dL (ref 30.0–36.0)
MCV: 96.2 fL (ref 80.0–100.0)
Platelets: 360 K/uL (ref 150–400)
RBC: 2.62 MIL/uL — ABNORMAL LOW (ref 4.22–5.81)
RDW: 18 % — ABNORMAL HIGH (ref 11.5–15.5)
WBC: 22.7 K/uL — ABNORMAL HIGH (ref 4.0–10.5)
nRBC: 0 % (ref 0.0–0.2)

## 2024-07-10 LAB — PROTIME-INR
INR: 1.2 (ref 0.8–1.2)
Prothrombin Time: 15.9 s — ABNORMAL HIGH (ref 11.4–15.2)

## 2024-07-10 LAB — I-STAT CG4 LACTIC ACID, ED
Lactic Acid, Venous: 3.1 mmol/L (ref 0.5–1.9)
Lactic Acid, Venous: 1 mmol/L (ref 0.5–1.9)

## 2024-07-10 LAB — TROPONIN I (HIGH SENSITIVITY)
Troponin I (High Sensitivity): 58 ng/L — ABNORMAL HIGH (ref <18)
Troponin I (High Sensitivity): 82 ng/L — ABNORMAL HIGH (ref <18)

## 2024-07-10 LAB — BRAIN NATRIURETIC PEPTIDE: B Natriuretic Peptide: 857.5 pg/mL — ABNORMAL HIGH (ref 0.0–100.0)

## 2024-07-10 LAB — CK: Total CK: 24 U/L — ABNORMAL LOW (ref 49–397)

## 2024-07-10 MED ORDER — VANCOMYCIN HCL 1500 MG/300ML IV SOLN
1500.0000 mg | Freq: Once | INTRAVENOUS | Status: AC
  Administered 2024-07-11: 1500 mg via INTRAVENOUS
  Filled 2024-07-10: qty 300

## 2024-07-10 MED ORDER — METRONIDAZOLE 500 MG/100ML IV SOLN
500.0000 mg | Freq: Once | INTRAVENOUS | Status: AC
  Administered 2024-07-11: 500 mg via INTRAVENOUS
  Filled 2024-07-10: qty 100

## 2024-07-10 MED ORDER — SODIUM CHLORIDE 0.9 % IV SOLN
2.0000 g | Freq: Once | INTRAVENOUS | Status: AC
  Administered 2024-07-11: 2 g via INTRAVENOUS
  Filled 2024-07-10: qty 12.5

## 2024-07-10 MED ORDER — IOHEXOL 350 MG/ML SOLN
75.0000 mL | Freq: Once | INTRAVENOUS | Status: AC | PRN
  Administered 2024-07-10: 75 mL via INTRAVENOUS

## 2024-07-10 MED ORDER — SODIUM CHLORIDE 0.9 % IV SOLN: Freq: Once | INTRAVENOUS | Status: DC

## 2024-07-10 NOTE — Progress Notes (Signed)
 ED Pharmacy Antibiotic Sign Off An antibiotic consult was received from an ED provider for cefepime  per pharmacy dosing for sepsis. A chart review was completed to assess appropriateness.   The following one time order(s) were placed:  Cefepime  2g IV  Further antibiotic and/or antibiotic pharmacy consults should be ordered by the admitting provider if indicated.   Thank you for allowing pharmacy to be a part of this patient's care.   Maurilio Patten, PharmD PGY1 Pharmacy Resident [facility] 07/10/2024 11:08 PM

## 2024-07-10 NOTE — ED Triage Notes (Signed)
 C/O lower abd pain radiates to bilateral legs for a week. C/O SHOB. Hx of afib. VSS axox4. Denies CP.

## 2024-07-10 NOTE — ED Provider Triage Note (Signed)
 Emergency Medicine Provider Triage Evaluation Note  Ronald Kaiser , a 89 y.o. male  was evaluated in triage.  Pt complains of diffuse abdominal pain for the past 1-2 weeks that he reports radiates down both of his legs and is worse after therapy. He also is complaining of some intermittent chest pain and SOB, but none currently. .  Review of Systems  Positive:  Negative:  Physical Exam  BP (!) 106/57 (BP Location: Right Arm)   Pulse (!) 104   Temp 99.7 F (37.6 C) (Oral)   Resp (!) 23   Ht 5' 11 (1.803 m)   Wt 64 kg   SpO2 99%   BMI 19.67 kg/m  Gen:   Awake, no distress   Resp:  Normal effort  MSK:   Moves extremities without difficulty  Other:  Diffuse abdominal tenderness to palpation. Soft. Palpable distal pulses  Medical Decision Making  Medically screening exam initiated at 6:47 PM.  Appropriate orders placed.  Theadore DELENA Side was informed that the remainder of the evaluation will be completed by another provider, this initial triage assessment does not replace that evaluation, and the importance of remaining in the ED until their evaluation is complete.  Nursing aware that the patient needs to be roomed now. He was recently admitted in the ICU last month for shock and is tachycardic with an elevated temp here.    Ronald Kaiser, [place] 07/10/24 1850

## 2024-07-10 NOTE — ED Provider Notes (Signed)
 [place] EMERGENCY DEPARTMENT AT [facility] Provider Note   CSN: [id number] Arrival date & time: 07/10/24  1754     Patient presents with: Shortness of Breath and Abdominal Pain   Ronald Kaiser is a 89 y.o. male.  {Add pertinent medical, surgical, social history, OB history to HPI:32947}  Shortness of Breath Associated symptoms: abdominal pain   Abdominal Pain Associated symptoms: shortness of breath        Prior to Admission medications   Medication Sig Start Date End Date Taking? Authorizing Provider  AMBULATORY NON FORMULARY MEDICATION Sucralfate 50 mg suppositories PR twice daily x 14 days 05/28/24   Leigh Elspeth SQUIBB, MD  aspirin  EC 81 MG tablet Take 81 mg by mouth daily.    [provider]  Calcium  Carb-Cholecalciferol (CALCIUM  + VITAMIN D3 PO) Take 1 tablet by mouth daily.    [provider]  enzalutamide  (XTANDI ) 40 MG capsule Take 160 mg by mouth daily.    [provider]  ferrous sulfate  325 (65 FE) MG EC tablet Take 325 mg by mouth daily with breakfast.    [provider]  furosemide  (LASIX ) 20 MG tablet Take 20 mg by mouth as needed for fluid or edema. Patient not taking: Reported on 06/24/2024    [provider]  midodrine  (PROAMATINE ) 5 MG tablet Take 1 tablet (5 mg total) by mouth 3 (three) times daily. 06/10/24 07/10/24  Pokhrel, Vernal, MD  Multiple Vitamins-Minerals (CENTRUM SILVER MEN 50+) TABS Take 1 tablet by mouth daily.    [provider]  nitroGLYCERIN  (NITROSTAT ) 0.4 MG SL tablet Place 1 tablet (0.4 mg total) under the tongue every 5 (five) minutes as needed. 04/17/17   Pietro Redell RAMAN, MD  pravastatin  (PRAVACHOL ) 20 MG tablet Take 1 tablet (20 mg total) by mouth every evening. 08/01/23   Pietro Redell RAMAN, MD    Allergies: Bactrim [sulfamethoxazole-trimethoprim], Augmentin [amoxicillin-pot clavulanate], Crestor  [rosuvastatin  calcium ], and Zetia  [ezetimibe ]    Review of Systems   Respiratory:  Positive for shortness of breath.   Gastrointestinal:  Positive for abdominal pain.    Updated Vital Signs BP (!) 106/57 (BP Location: Right Arm)   Pulse (!) 104   Temp 99.7 F (37.6 C) (Oral)   Resp (!) 23   Ht 5' 11 (1.803 m)   Wt 64 kg   SpO2 99%   BMI 19.67 kg/m   Physical Exam  (all labs ordered are listed, but only abnormal results are displayed) Labs Reviewed  CULTURE, BLOOD (ROUTINE X 2)  CULTURE, BLOOD (ROUTINE X 2)  COMPREHENSIVE METABOLIC PANEL WITH GFR  CBC WITH DIFFERENTIAL/PLATELET  URINALYSIS, W/ REFLEX TO CULTURE (INFECTION SUSPECTED)  BRAIN NATRIURETIC PEPTIDE  PROTIME-INR  I-STAT CG4 LACTIC ACID, Ronald  TROPONIN I (HIGH SENSITIVITY)    EKG: None  Radiology: No results found.  {Document cardiac monitor, telemetry assessment procedure when appropriate:32947} Procedures   Medications Ordered in the Ronald - No data to display    {Click here for ABCD2, HEART and other calculators REFRESH Note before signing:1}                              Medical Decision Making Amount and/or Complexity of Data Reviewed Labs: ordered. Radiology: ordered.  Risk Prescription drug management.   ***  {Document critical care time when appropriate  Document review of labs and clinical decision tools ie CHADS2VASC2, etc  Document your independent review of radiology  images and any outside records  Document your discussion with family members, caretakers and with consultants  Document social determinants of health affecting pt's care  Document your decision making why or why not admission, treatments were needed:32947:::1}   Final diagnoses:  None    Ronald Discharge Orders     None

## 2024-07-10 NOTE — ED Provider Notes (Incomplete)
 [place] EMERGENCY DEPARTMENT AT [facility] Provider Note   CSN: [id number] Arrival date & time: 07/10/24  1754     Patient presents with: Shortness of Breath and Abdominal Pain   Ed or Ronald Kaiser is a 89 y.o. male with h/o past medical history of    Shortness of Breath Associated symptoms: abdominal pain   Abdominal Pain Associated symptoms: shortness of breath        Prior to Admission medications   Medication Sig Start Date End Date Taking? Authorizing Provider  AMBULATORY NON FORMULARY MEDICATION Sucralfate 50 mg suppositories PR twice daily x 14 days 05/28/24   Leigh Elspeth SQUIBB, MD  aspirin  EC 81 MG tablet Take 81 mg by mouth daily.    [provider]  Calcium  Carb-Cholecalciferol (CALCIUM  + VITAMIN D3 PO) Take 1 tablet by mouth daily.    [provider]  enzalutamide  (XTANDI ) 40 MG capsule Take 160 mg by mouth daily.    [provider]  ferrous sulfate  325 (65 FE) MG EC tablet Take 325 mg by mouth daily with breakfast.    [provider]  furosemide  (LASIX ) 20 MG tablet Take 20 mg by mouth as needed for fluid or edema. Patient not taking: Reported on 06/24/2024    [provider]  midodrine  (PROAMATINE ) 5 MG tablet Take 1 tablet (5 mg total) by mouth 3 (three) times daily. 06/10/24 07/10/24  Pokhrel, Vernal, MD  Multiple Vitamins-Minerals (CENTRUM SILVER MEN 50+) TABS Take 1 tablet by mouth daily.    [provider]  nitroGLYCERIN  (NITROSTAT ) 0.4 MG SL tablet Place 1 tablet (0.4 mg total) under the tongue every 5 (five) minutes as needed. 04/17/17   Pietro Redell RAMAN, MD  pravastatin  (PRAVACHOL ) 20 MG tablet Take 1 tablet (20 mg total) by mouth every evening. 08/01/23   Pietro Redell RAMAN, MD    Allergies: Bactrim [sulfamethoxazole-trimethoprim], Augmentin [amoxicillin-pot clavulanate], Crestor  [rosuvastatin  calcium ], and Zetia  [ezetimibe ]    Review of Systems  Respiratory:  Positive for shortness of  breath.   Gastrointestinal:  Positive for abdominal pain.    Updated Vital Signs BP (!) 106/57 (BP Location: Right Arm)   Pulse (!) 104   Temp 99.7 F (37.6 C) (Oral)   Resp (!) 23   Ht 5' 11 (1.803 m)   Wt 64 kg   SpO2 99%   BMI 19.67 kg/m   Physical Exam  (all labs ordered are listed, but only abnormal results are displayed) Labs Reviewed  CULTURE, BLOOD (ROUTINE X 2)  CULTURE, BLOOD (ROUTINE X 2)  COMPREHENSIVE METABOLIC PANEL WITH GFR  CBC WITH DIFFERENTIAL/PLATELET  URINALYSIS, W/ REFLEX TO CULTURE (INFECTION SUSPECTED)  BRAIN NATRIURETIC PEPTIDE  PROTIME-INR  I-STAT CG4 LACTIC ACID, ED  TROPONIN I (HIGH SENSITIVITY)    EKG: None  Radiology: No results found.  {Document cardiac monitor, telemetry assessment procedure when appropriate:32947} Procedures   Medications Ordered in the ED - No data to display    {Click here for ABCD2, HEART and other calculators REFRESH Note before signing:1}                              Medical Decision Making Amount and/or Complexity of Data Reviewed Labs: ordered. Radiology: ordered.  Risk Prescription drug management. Decision regarding hospitalization.   ***  {Document critical care time when appropriate  Document review of labs and clinical decision tools ie CHADS2VASC2, etc  Document your independent review of radiology  images and any outside records  Document your discussion with family members, caretakers and with consultants  Document social determinants of health affecting pt's care  Document your decision making why or why not admission, treatments were needed:32947:::1}   Final diagnoses:  None    ED Discharge Orders     None

## 2024-07-11 ENCOUNTER — Encounter (HOSPITAL_COMMUNITY): Payer: Self-pay | Admitting: Internal Medicine

## 2024-07-11 ENCOUNTER — Inpatient Hospital Stay (HOSPITAL_COMMUNITY)

## 2024-07-11 DIAGNOSIS — B964 Proteus (mirabilis) (morganii) as the cause of diseases classified elsewhere: Secondary | ICD-10-CM | POA: Diagnosis present

## 2024-07-11 DIAGNOSIS — I495 Sick sinus syndrome: Secondary | ICD-10-CM | POA: Diagnosis present

## 2024-07-11 DIAGNOSIS — D509 Iron deficiency anemia, unspecified: Secondary | ICD-10-CM | POA: Diagnosis present

## 2024-07-11 DIAGNOSIS — S3011XA Contusion of abdominal wall, initial encounter: Secondary | ICD-10-CM | POA: Diagnosis present

## 2024-07-11 DIAGNOSIS — R6520 Severe sepsis without septic shock: Secondary | ICD-10-CM | POA: Diagnosis present

## 2024-07-11 DIAGNOSIS — I4821 Permanent atrial fibrillation: Secondary | ICD-10-CM | POA: Diagnosis present

## 2024-07-11 DIAGNOSIS — I7143 Infrarenal abdominal aortic aneurysm, without rupture: Secondary | ICD-10-CM | POA: Diagnosis present

## 2024-07-11 DIAGNOSIS — C7951 Secondary malignant neoplasm of bone: Secondary | ICD-10-CM

## 2024-07-11 DIAGNOSIS — E43 Unspecified severe protein-calorie malnutrition: Secondary | ICD-10-CM | POA: Diagnosis present

## 2024-07-11 DIAGNOSIS — M868X8 Other osteomyelitis, other site: Secondary | ICD-10-CM | POA: Diagnosis present

## 2024-07-11 DIAGNOSIS — W19XXXA Unspecified fall, initial encounter: Secondary | ICD-10-CM | POA: Diagnosis present

## 2024-07-11 DIAGNOSIS — I5022 Chronic systolic (congestive) heart failure: Secondary | ICD-10-CM | POA: Diagnosis not present

## 2024-07-11 DIAGNOSIS — I5020 Unspecified systolic (congestive) heart failure: Secondary | ICD-10-CM

## 2024-07-11 DIAGNOSIS — E872 Acidosis, unspecified: Secondary | ICD-10-CM | POA: Diagnosis present

## 2024-07-11 DIAGNOSIS — Z01818 Encounter for other preprocedural examination: Secondary | ICD-10-CM | POA: Diagnosis not present

## 2024-07-11 DIAGNOSIS — K651 Peritoneal abscess: Secondary | ICD-10-CM | POA: Diagnosis present

## 2024-07-11 DIAGNOSIS — M6008 Infective myositis, other site: Secondary | ICD-10-CM | POA: Diagnosis present

## 2024-07-11 DIAGNOSIS — I5023 Acute on chronic systolic (congestive) heart failure: Secondary | ICD-10-CM | POA: Diagnosis present

## 2024-07-11 DIAGNOSIS — L02211 Cutaneous abscess of abdominal wall: Secondary | ICD-10-CM | POA: Diagnosis present

## 2024-07-11 DIAGNOSIS — Z7189 Other specified counseling: Secondary | ICD-10-CM | POA: Diagnosis not present

## 2024-07-11 DIAGNOSIS — I110 Hypertensive heart disease with heart failure: Secondary | ICD-10-CM | POA: Diagnosis present

## 2024-07-11 DIAGNOSIS — Z8679 Personal history of other diseases of the circulatory system: Secondary | ICD-10-CM | POA: Diagnosis not present

## 2024-07-11 DIAGNOSIS — A4159 Other Gram-negative sepsis: Secondary | ICD-10-CM | POA: Diagnosis present

## 2024-07-11 DIAGNOSIS — D62 Acute posthemorrhagic anemia: Secondary | ICD-10-CM | POA: Diagnosis present

## 2024-07-11 DIAGNOSIS — C61 Malignant neoplasm of prostate: Secondary | ICD-10-CM

## 2024-07-11 DIAGNOSIS — A419 Sepsis, unspecified organism: Secondary | ICD-10-CM

## 2024-07-11 DIAGNOSIS — I2489 Other forms of acute ischemic heart disease: Secondary | ICD-10-CM | POA: Diagnosis present

## 2024-07-11 DIAGNOSIS — I10 Essential (primary) hypertension: Secondary | ICD-10-CM | POA: Diagnosis not present

## 2024-07-11 DIAGNOSIS — I480 Paroxysmal atrial fibrillation: Secondary | ICD-10-CM | POA: Diagnosis not present

## 2024-07-11 DIAGNOSIS — R188 Other ascites: Secondary | ICD-10-CM | POA: Diagnosis present

## 2024-07-11 DIAGNOSIS — R222 Localized swelling, mass and lump, trunk: Secondary | ICD-10-CM | POA: Diagnosis not present

## 2024-07-11 DIAGNOSIS — J9811 Atelectasis: Secondary | ICD-10-CM | POA: Diagnosis present

## 2024-07-11 DIAGNOSIS — M869 Osteomyelitis, unspecified: Secondary | ICD-10-CM | POA: Diagnosis not present

## 2024-07-11 DIAGNOSIS — D630 Anemia in neoplastic disease: Secondary | ICD-10-CM | POA: Diagnosis present

## 2024-07-11 DIAGNOSIS — R7989 Other specified abnormal findings of blood chemistry: Secondary | ICD-10-CM | POA: Insufficient documentation

## 2024-07-11 DIAGNOSIS — N136 Pyonephrosis: Secondary | ICD-10-CM | POA: Diagnosis present

## 2024-07-11 DIAGNOSIS — E785 Hyperlipidemia, unspecified: Secondary | ICD-10-CM | POA: Diagnosis present

## 2024-07-11 DIAGNOSIS — Z515 Encounter for palliative care: Secondary | ICD-10-CM | POA: Diagnosis not present

## 2024-07-11 DIAGNOSIS — Z681 Body mass index (BMI) 19 or less, adult: Secondary | ICD-10-CM | POA: Diagnosis not present

## 2024-07-11 LAB — CBC
HCT: 22.8 % — ABNORMAL LOW (ref 39.0–52.0)
Hemoglobin: 7.2 g/dL — ABNORMAL LOW (ref 13.0–17.0)
MCH: 30.8 pg (ref 26.0–34.0)
MCHC: 31.6 g/dL (ref 30.0–36.0)
MCV: 97.4 fL (ref 80.0–100.0)
Platelets: 309 K/uL (ref 150–400)
RBC: 2.34 MIL/uL — ABNORMAL LOW (ref 4.22–5.81)
RDW: 18.2 % — ABNORMAL HIGH (ref 11.5–15.5)
WBC: 18.8 K/uL — ABNORMAL HIGH (ref 4.0–10.5)
nRBC: 0 % (ref 0.0–0.2)

## 2024-07-11 LAB — URINALYSIS, W/ REFLEX TO CULTURE (INFECTION SUSPECTED)
Bilirubin Urine: NEGATIVE
Glucose, UA: NEGATIVE mg/dL
Ketones, ur: NEGATIVE mg/dL
Nitrite: NEGATIVE
Protein, ur: 100 mg/dL — AB
Specific Gravity, Urine: 1.024 (ref 1.005–1.030)
WBC, UA: >50 WBC/hpf (ref 0–5)
pH: 7 (ref 5.0–8.0)

## 2024-07-11 LAB — ECHOCARDIOGRAM LIMITED
Calc EF: 33.2 %
Height: 71 in
Single Plane A2C EF: 23.9 %
Single Plane A4C EF: 38.5 %
Weight: 2256 [oz_av]

## 2024-07-11 LAB — COMPREHENSIVE METABOLIC PANEL WITH GFR
ALT: 15 U/L (ref 0–44)
AST: 15 U/L (ref 15–41)
Albumin: 1.5 g/dL — ABNORMAL LOW (ref 3.5–5.0)
Alkaline Phosphatase: 55 U/L (ref 38–126)
Anion gap: 12 (ref 5–15)
BUN: 33 mg/dL — ABNORMAL HIGH (ref 8–23)
CO2: 23 mmol/L (ref 22–32)
Calcium: 7.9 mg/dL — ABNORMAL LOW (ref 8.9–10.3)
Chloride: 100 mmol/L (ref 98–111)
Creatinine, Ser: 0.9 mg/dL (ref 0.61–1.24)
GFR, Estimated: >60 mL/min (ref >60)
Glucose, Bld: 115 mg/dL — ABNORMAL HIGH (ref 70–99)
Potassium: 3.7 mmol/L (ref 3.5–5.1)
Sodium: 135 mmol/L (ref 135–145)
Total Bilirubin: 0.4 mg/dL (ref 0.0–1.2)
Total Protein: 5.5 g/dL — ABNORMAL LOW (ref 6.5–8.1)

## 2024-07-11 LAB — PATHOLOGIST SMEAR REVIEW

## 2024-07-11 LAB — PREPARE RBC (CROSSMATCH)

## 2024-07-11 MED ORDER — ONDANSETRON HCL 4 MG/2ML IJ SOLN: 4.0000 mg | Freq: Four times a day (QID) | INTRAMUSCULAR | Status: DC | PRN

## 2024-07-11 MED ORDER — PRAVASTATIN SODIUM 10 MG PO TABS
20.0000 mg | ORAL_TABLET | Freq: Every evening | ORAL | Status: DC
  Administered 2024-07-11 – 2024-07-22 (×12): 20 mg via ORAL
  Filled 2024-07-11 (×12): qty 2

## 2024-07-11 MED ORDER — SODIUM CHLORIDE 0.9% FLUSH
3.0000 mL | Freq: Two times a day (BID) | INTRAVENOUS | Status: DC
  Administered 2024-07-11 – 2024-07-23 (×21): 3 mL via INTRAVENOUS

## 2024-07-11 MED ORDER — SODIUM CHLORIDE 0.9 % IV SOLN: 250.0000 mL | INTRAVENOUS | Status: AC | PRN

## 2024-07-11 MED ORDER — FERROUS SULFATE 325 (65 FE) MG PO TABS
325.0000 mg | ORAL_TABLET | Freq: Every day | ORAL | Status: DC
Start: 2024-07-11 — End: 2024-07-23
  Administered 2024-07-11 – 2024-07-23 (×13): 325 mg via ORAL
  Filled 2024-07-11 (×13): qty 1

## 2024-07-11 MED ORDER — ENOXAPARIN SODIUM 40 MG/0.4ML IJ SOSY
40.0000 mg | PREFILLED_SYRINGE | INTRAMUSCULAR | Status: DC
  Filled 2024-07-11: qty 0.4

## 2024-07-11 MED ORDER — ASPIRIN 81 MG PO TBEC
81.0000 mg | DELAYED_RELEASE_TABLET | Freq: Every day | ORAL | Status: DC
  Administered 2024-07-11 – 2024-07-12 (×2): 81 mg via ORAL
  Filled 2024-07-11 (×2): qty 1

## 2024-07-11 MED ORDER — LACTATED RINGERS IV SOLN: INTRAVENOUS | Status: DC

## 2024-07-11 MED ORDER — ACETAMINOPHEN 650 MG RE SUPP: 650.0000 mg | Freq: Four times a day (QID) | RECTAL | Status: DC | PRN

## 2024-07-11 MED ORDER — ONDANSETRON HCL 4 MG PO TABS: 4.0000 mg | ORAL_TABLET | Freq: Four times a day (QID) | ORAL | Status: DC | PRN

## 2024-07-11 MED ORDER — CHLORHEXIDINE GLUCONATE CLOTH 2 % EX PADS
6.0000 | MEDICATED_PAD | Freq: Every day | CUTANEOUS | Status: DC
  Administered 2024-07-12 – 2024-07-23 (×12): 6 via TOPICAL

## 2024-07-11 MED ORDER — SODIUM CHLORIDE 0.9% FLUSH: 3.0000 mL | INTRAVENOUS | Status: DC | PRN

## 2024-07-11 MED ORDER — PERFLUTREN LIPID MICROSPHERE
1.0000 mL | INTRAVENOUS | Status: AC | PRN
  Administered 2024-07-11: 2 mL via INTRAVENOUS

## 2024-07-11 MED ORDER — MIDODRINE HCL 5 MG PO TABS
5.0000 mg | ORAL_TABLET | Freq: Three times a day (TID) | ORAL | Status: DC
Start: 2024-07-11 — End: 2024-07-13
  Administered 2024-07-11 – 2024-07-13 (×7): 5 mg via ORAL
  Filled 2024-07-11 (×8): qty 1

## 2024-07-11 MED ORDER — ACETAMINOPHEN 325 MG PO TABS: 650.0000 mg | ORAL_TABLET | Freq: Four times a day (QID) | ORAL | Status: DC | PRN

## 2024-07-11 MED ORDER — SODIUM CHLORIDE 0.9% FLUSH
3.0000 mL | Freq: Two times a day (BID) | INTRAVENOUS | Status: DC
  Administered 2024-07-11 – 2024-07-23 (×22): 3 mL via INTRAVENOUS

## 2024-07-11 MED ORDER — VANCOMYCIN HCL 1250 MG/250ML IV SOLN
1250.0000 mg | INTRAVENOUS | Status: DC
  Administered 2024-07-12 – 2024-07-15 (×4): 1250 mg via INTRAVENOUS
  Filled 2024-07-11 (×5): qty 250

## 2024-07-11 MED ORDER — PIPERACILLIN-TAZOBACTAM 3.375 G IVPB
3.3750 g | Freq: Three times a day (TID) | INTRAVENOUS | Status: DC
  Administered 2024-07-11 – 2024-07-15 (×13): 3.375 g via INTRAVENOUS
  Filled 2024-07-11 (×13): qty 50

## 2024-07-11 MED ORDER — SODIUM CHLORIDE 0.9% IV SOLUTION: Freq: Once | INTRAVENOUS | Status: AC

## 2024-07-11 NOTE — Progress Notes (Signed)
 Abd wall fluid collection case to be delayed to tomorrow. NPO removed and added to MN tonight for procedure tomorrow.

## 2024-07-11 NOTE — Consult Note (Signed)
 Chief Complaint: Abd wall fluid collection  Referring Provider(s): E Simaan, PA  Supervising Physician: Jennefer Rover  Patient Status: [facility] - In-pt  History of Present Illness: Ronald Kaiser is a 89 y.o. male with PMH significant for A-fib not on anticoagulation, CAD, hypertension, hyperlipidemia, cardiomyopathy, and metastatic prostate cancer s/p radiation therapy. He presented to the ED 10/8 for lower abd pain radiating to L leg x 2 weeks, much worse during therapy sessions. He has experienced frequent falls at home. Part of workup included a CTA Chest/Abd/Pel which revealed an abd wall fluid collection (low anterior rectus abdominus, likely abscess) that the team has requested IR aspirate for culture.  Also tachycardic, tachypneic, borderline hypotensive, afebrile, CBC showing leukocytosis 23.2, initial lactic acid 3.1 improved to 1; admitted for sepsis r/t UTI and abd wall abscess.   Denies fever, chills, SOB, CP, cough, blood in urine, dizziness. Endorses lower abd pain which he states is tolerable when he is still but much worse with palp or movement.    Allergies Reviewed:  Bactrim [sulfamethoxazole-trimethoprim], Augmentin [amoxicillin-pot clavulanate], Crestor  [rosuvastatin  calcium ], and Zetia  [ezetimibe ]   Patient is Full Code  Past Medical History:  Diagnosis Date   AKI (acute kidney injury) 11/12/2018   CAD (coronary artery disease)    CHF (congestive heart failure) (HCC)    Dyspnea    Foley catheter in place    HTN (hypertension)    Hyperlipidemia    Myocardial infarction (HCC) 2000   REPORTS HE HAS HAD 5 HEART ATTACKS BUT SOME OF THE WERE NOT SERIOUS    Obesity    Prostate cancer [facility])     Past Surgical History:  Procedure Laterality Date   CARDIAC CATHETERIZATION N/A 03/09/2016   Procedure: Left Heart Cath and Coronary Angiography;  Surgeon: Victory LELON Sharps, MD;  Location: [facility] INVASIVE CV LAB;  Service: Cardiovascular;  Laterality: N/A;   COLONOSCOPY N/A  05/28/2024   Procedure: COLONOSCOPY;  Surgeon: Leigh Elspeth SQUIBB, MD;  Location: [facility] ENDOSCOPY;  Service: Gastroenterology;  Laterality: N/A;   CYSTOSCOPY W/ RETROGRADES Bilateral 08/19/2020   Procedure: BILATERAL RETROGRADE PYELOGRAM;  Surgeon: Cam Morene LELON, MD;  Location: [facility] ORS;  Service: Urology;  Laterality: Bilateral;   ESOPHAGOGASTRODUODENOSCOPY N/A 05/28/2024   Procedure: EGD (ESOPHAGOGASTRODUODENOSCOPY);  Surgeon: Leigh Elspeth SQUIBB, MD;  Location: [facility] ENDOSCOPY;  Service: Gastroenterology;  Laterality: N/A;   RADIATION TO PROSTATE     Stenting of LAD  2000   TONSILLECTOMY     TRANSURETHRAL RESECTION OF PROSTATE N/A 03/01/2019   Procedure: TRANSURETHRAL RESECTION OF THE PROSTATE (TURP);  Surgeon: Cam Morene LELON, MD;  Location: [facility] ORS;  Service: Urology;  Laterality: N/A;  75 MINS   TRANSURETHRAL RESECTION OF PROSTATE N/A 08/19/2020   Procedure: TRANSURETHRAL RESECTION OF THE PROSTATE (TURP);  Surgeon: Cam Morene LELON, MD;  Location: [facility] ORS;  Service: Urology;  Laterality: N/A;      Medications: Prior to Admission medications   Medication Sig Start Date End Date Taking? Authorizing Provider  aspirin  EC 81 MG tablet Take 81 mg by mouth daily.   Yes [provider]  Calcium  Carb-Cholecalciferol (CALCIUM  + VITAMIN D3 PO) Take 1 tablet by mouth daily.   Yes [provider]  ferrous sulfate  325 (65 FE) MG EC tablet Take 325 mg by mouth daily with breakfast.   Yes [provider]  furosemide  (LASIX ) 20 MG tablet Take 20 mg by mouth as needed for fluid or edema.   Yes [provider]  Multiple Vitamins-Minerals (CENTRUM  SILVER MEN 50+) TABS Take 1 tablet by mouth daily.   Yes [provider]  nitroGLYCERIN  (NITROSTAT ) 0.4 MG SL tablet Place 1 tablet (0.4 mg total) under the tongue every 5 (five) minutes as needed. 04/17/17  Yes Pietro Redell RAMAN, MD  pravastatin  (PRAVACHOL ) 20 MG tablet Take 1 tablet (20 mg total) by mouth every  evening. 08/01/23  Yes Pietro Redell RAMAN, MD     Family History  Problem Relation Age of Onset   Hypertension Father    Breast cancer Neg Hx    Prostate cancer Neg Hx    Colon cancer Neg Hx    Pancreatic cancer Neg Hx     Social History   Socioeconomic History   Marital status: Married    Spouse name: Not on file   Number of children: Not on file   Years of education: Not on file   Highest education level: Not on file  Occupational History    Comment: retired  Tobacco Use   Smoking status: Former    Current packs/day: 0.00    Average packs/day: 0.5 packs/day for 7.0 years (3.5 ttl pk-yrs)    Types: Cigarettes    Start date: 10/04/1955    Quit date: 10/03/1962    Years since quitting: 61.8   Smokeless tobacco: Never  Vaping Use   Vaping status: Never Used  Substance and Sexual Activity   Alcohol use: Yes    Comment: [facility]CC BEER AND WINE    Drug use: Not Currently   Sexual activity: Not Currently  Other Topics Concern   Not on file  Social History Narrative   Moved from [facility] to [place], [place] following divorce to be closer to his brother. Patient has remarried.   Social Drivers of Corporate investment banker Strain: Not on file  Food Insecurity: Low Risk  (06/14/2024)   Received from [facility]   Hunger Vital Sign    Within the past 12 months, you worried that your food would run out before you got money to buy more: Never true    Within the past 12 months, the food you bought just didn't last and you didn't have money to get more. : Never true  Transportation Needs: No Transportation Needs (06/14/2024)   Received from [employer]    In the past 12 months, has lack of reliable transportation kept you from medical appointments, meetings, work or from getting things needed for daily living? : No  Physical Activity: Not on file  Stress: Not on file  Social Connections: Socially Integrated (06/08/2024)   Social Connection and Isolation Panel    Frequency  of Communication with Friends and Family: More than three times a week    Frequency of Social Gatherings with Friends and Family: More than three times a week    Attends Religious Services: More than 4 times per year    Active Member of [employer] or Organizations: No    Attends Engineer, structural: More than 4 times per year    Marital Status: Married  Recent Concern: Social Connections - Moderately Isolated (06/07/2024)   Social Connection and Isolation Panel    Frequency of Communication with Friends and Family: More than three times a week    Frequency of Social Gatherings with Friends and Family: More than three times a week    Attends Religious Services: Never    Database administrator or Organizations: No    Attends Banker Meetings: Never  Marital Status: Married     Review of Systems: A 12 point ROS discussed and pertinent positives are indicated in the HPI above.  All other systems are negative.   Vital Signs: BP (!) 102/55   Pulse 86   Temp (!) 96.7 F (35.9 C) (Oral)   Resp 16   Ht 5' 11 (1.803 m)   Wt 141 lb (64 kg)   SpO2 99%   BMI 19.67 kg/m     Physical Exam HENT:     Mouth/Throat:     Mouth: Mucous membranes are moist.     Pharynx: Oropharynx is clear.  Cardiovascular:     Rate and Rhythm: Normal rate.     Pulses: Normal pulses.  Pulmonary:     Effort: Pulmonary effort is normal.  Abdominal:     General: There is no distension.     Palpations: Abdomen is soft.     Tenderness: There is abdominal tenderness.     Comments: Erythema/tenderness of lower abd   Skin:    General: Skin is warm and dry.  Neurological:     Mental Status: He is alert and oriented to person, place, and time.  Psychiatric:        Mood and Affect: Mood normal.        Behavior: Behavior normal.     Imaging: CT Angio Chest/Abd/Pel for Dissection W and/or Wo Contrast Result Date: 07/10/2024 CLINICAL DATA:  Acute aortic syndrome suspected. Shortness of  breath. EXAM: CT ANGIOGRAPHY CHEST, ABDOMEN AND PELVIS TECHNIQUE: Non-contrast CT of the chest was initially obtained. Multidetector CT imaging through the chest, abdomen and pelvis was performed using the standard protocol during bolus administration of intravenous contrast. Multiplanar reconstructed images and MIPs were obtained and reviewed to evaluate the vascular anatomy. RADIATION DOSE REDUCTION: This exam was performed according to the departmental dose-optimization program which includes automated exposure control, adjustment of the mA and/or kV according to patient size and/or use of iterative reconstruction technique. CONTRAST:  75mL OMNIPAQUE  IOHEXOL  350 MG/ML SOLN COMPARISON:  Chest radiograph 07/10/2024. CT abdomen and pelvis 05/26/2024. CT chest 03/04/2022. PET CT 12/13/2022 FINDINGS: CTA CHEST FINDINGS Cardiovascular: Unenhanced images of the chest demonstrate diffuse calcification throughout the aorta and coronary arteries. No acute intramural hematoma. Images obtained during arterial phase after intravenous contrast material administration demonstrate patent common normal caliber thoracic aorta. No aortic dissection. Great vessel origins are patent. No filling defects in the central pulmonary arteries. No evidence of significant pulmonary embolus. Normal heart size although the left ventricle appears mildly dilated. No pericardial effusion. Mediastinum/Nodes: Esophagus is decompressed. No significant lymphadenopathy. Thyroid gland is unremarkable. Lungs/Pleura: Emphysematous changes in the lungs. Bilateral basilar atelectasis. Peribronchial thickening with mild bronchiectasis consistent with chronic bronchitis. No pleural effusion or pneumothorax. Musculoskeletal: Degenerative changes in the spine. Compression of T11 and T12 vertebra, unchanged. Sclerotic lesions demonstrated in several thoracic vertebrae and ribs corresponding to known metastatic disease. Review of the MIP images confirms the above  findings. CTA ABDOMEN AND PELVIS FINDINGS VASCULAR Aorta: Normal caliber aorta without aneurysm, dissection, vasculitis or significant stenosis. Diffuse aortic calcification. Celiac: Patent without evidence of aneurysm, dissection, vasculitis or significant stenosis. SMA: Patent without evidence of aneurysm, dissection, vasculitis or significant stenosis. Renals: Both renal arteries are patent without evidence of aneurysm, dissection, vasculitis, fibromuscular dysplasia or significant stenosis. IMA: Patent without evidence of aneurysm, dissection, vasculitis or significant stenosis. Inflow: Patent without evidence of aneurysm, dissection, vasculitis or significant stenosis. Veins: No obvious venous abnormality within the limitations of this  arterial phase study. Review of the MIP images confirms the above findings. NON-VASCULAR Hepatobiliary: No focal liver abnormality is seen. No gallstones, gallbladder wall thickening, or biliary dilatation. Pancreas: Unremarkable. No pancreatic ductal dilatation or surrounding inflammatory changes. Spleen: Normal in size without focal abnormality. Adrenals/Urinary Tract: No adrenal gland nodules. Renal nephrograms are symmetrical but there is bilateral hydronephrosis and hydroureter down to the level of the bladder. No ureteral stones are identified. Changes likely indicate reflux disease or bladder outlet obstruction. Diffuse bladder wall thickening likely to represent radiation change given history of prostate cancer. Bladder outlet obstruction could also have this appearance. Coarse calcifications in the dependent portion of the bladder or possibly in the bladder wall or prostate. This is unchanged since prior study. Stomach/Bowel: Stomach, small bowel, and colon are not abnormally distended. Stool throughout the colon. No wall thickening or inflammatory changes. Appendix is not identified. Lymphatic: No significant lymphadenopathy. Reproductive: Prostate gland is not well  visualized and may be surgically absent or atrophic due to radiation therapy. In the prostate bed, there is increased soft tissue which extends into the anterolateral left pelvic sidewall and the left proximal hip adductor muscles. This likely represents residual tumor or post treatment changes. Pelvic soft tissue infiltration is increasing since the previous studies. Loss of fat plane between the soft tissues and the bladder. Pelvic sidewall soft tissue today measures 3.2 cm thickness. Other: No free air or free fluid in the abdomen. There is a large heterogeneous collection in the inferior left and central rectus abdominus muscles extending down to the symphysis pubis. This collection measures up to about 5.2 cm diameter. Stranding in the surrounding subcutaneous fat. This is likely an abscess. Musculoskeletal: Degenerative changes in the spine and hips. Scattered focal areas of bone sclerosis consistent with known metastasis. Cortical erosion at the symphysis pubis may represent post treatment changes or osteomyelitis. Review of the MIP images confirms the above findings. IMPRESSION: 1. Aortic atherosclerosis. No evidence of aneurysm or dissection involving the thoracic or abdominal aorta. 2. Overall normal heart size with left ventricular dilatation. 3. Atelectasis in the lung bases. 4. Increasing soft tissue mass/infiltration in the low pelvis involving the prostate bed and extending to the left pelvic sidewall and into the proximal left adductor muscles. This likely represents residual recurrent tumor versus post treatment changes or possibly inflammatory process. 5. Diffuse bladder wall thickening may be due to bladder outlet obstruction or treatment changes. Stable calcification in the bladder or prostate region. Bilateral hydronephrosis and hydroureter likely related to reflux or bladder outlet obstruction. 6. Low anterior rectus abdominus collection with surrounding stranding likely representing abscess.  7. Cortical erosion at the symphysis pubis may represent osteomyelitis or possibly radiation changes. 8. Multiple sclerotic bone lesions consistent with known metastatic disease. Electronically Signed   By: Elsie Gravely M.D.   On: 07/10/2024 23:03   DG Chest Port 1 View Result Date: 07/10/2024 CLINICAL DATA:  Question of sepsis to evaluate for abnormality. Diffuse abdominal pain for 2 weeks radiating down the legs. EXAM: PORTABLE CHEST 1 VIEW COMPARISON:  06/06/2024 FINDINGS: Shallow inspiration. Heart size and pulmonary vascularity are normal for technique. Peribronchial thickening with bronchiectasis in streaky perihilar opacities likely representing bronchitic changes. This could represent acute or chronic bronchitis. Scarring in the lung apices and bases. Probable linear atelectasis also in the lung bases. No pleural effusion or pneumothorax. Mediastinal contours appear intact. Calcification of the aorta. Poorly defined sclerotic lesions demonstrated in the scapula and ribs correlating with known metastatic disease. IMPRESSION:  1. Shallow inspiration. 2. Emphysematous changes, fibrosis, and bronchitic changes in the lungs. 3. Mild linear atelectasis in the lung bases. No focal consolidation. 4. Sclerotic bone lesions corresponding to known metastatic disease. Electronically Signed   By: Elsie Gravely M.D.   On: 07/10/2024 19:37    Labs:  CBC: Recent Labs    06/10/24 0319 07/10/24 1846 07/10/24 2119 07/11/24 0537  WBC 8.6 23.2* 22.7* 18.8*  HGB 7.0* 9.2* 8.0* 7.2*  HCT 21.7* 29.0* 25.2* 22.8*  PLT 228 PLATELET CLUMPS NOTED ON SMEAR, UNABLE TO ESTIMATE 360 309    COAGS: Recent Labs    06/06/24 2020 07/10/24 1913  INR 1.2 1.2    BMP: Recent Labs    06/10/24 0319 07/10/24 1846 07/10/24 2119 07/11/24 0537  NA 135 SPECIMEN HEMOLYZED. HEMOLYSIS MAY AFFECT INTEGRITY OF RESULTS. 133* 135  K 3.8 SPECIMEN HEMOLYZED. HEMOLYSIS MAY AFFECT INTEGRITY OF RESULTS. 4.2 3.7  CL 101  SPECIMEN HEMOLYZED. HEMOLYSIS MAY AFFECT INTEGRITY OF RESULTS. 98 100  CO2 24 SPECIMEN HEMOLYZED. HEMOLYSIS MAY AFFECT INTEGRITY OF RESULTS. 22 23  GLUCOSE 101* SPECIMEN HEMOLYZED. HEMOLYSIS MAY AFFECT INTEGRITY OF RESULTS. 128* 115*  BUN 11 SPECIMEN HEMOLYZED. HEMOLYSIS MAY AFFECT INTEGRITY OF RESULTS. 41* 33*  CALCIUM  7.5* SPECIMEN HEMOLYZED. HEMOLYSIS MAY AFFECT INTEGRITY OF RESULTS. 7.9* 7.9*  CREATININE 0.65 SPECIMEN HEMOLYZED. HEMOLYSIS MAY AFFECT INTEGRITY OF RESULTS. 1.04 0.90  GFRNONAA >60 SPECIMEN HEMOLYZED. HEMOLYSIS MAY AFFECT INTEGRITY OF RESULTS. >60 >60  GFRAA  --  SPECIMEN HEMOLYZED. HEMOLYSIS MAY AFFECT INTEGRITY OF RESULTS.  --   --     LIVER FUNCTION TESTS: Recent Labs    06/06/24 2020 07/10/24 1846 07/10/24 2119 07/11/24 0537  BILITOT 0.5 SPECIMEN HEMOLYZED. HEMOLYSIS MAY AFFECT INTEGRITY OF RESULTS. 0.4 0.4  AST 23 SPECIMEN HEMOLYZED. HEMOLYSIS MAY AFFECT INTEGRITY OF RESULTS. 22 15  ALT 13 SPECIMEN HEMOLYZED. HEMOLYSIS MAY AFFECT INTEGRITY OF RESULTS. 17 15  ALKPHOS 83 SPECIMEN HEMOLYZED. HEMOLYSIS MAY AFFECT INTEGRITY OF RESULTS. 61 55  PROT 5.1* SPECIMEN HEMOLYZED. HEMOLYSIS MAY AFFECT INTEGRITY OF RESULTS. 6.2* 5.5*  ALBUMIN 1.7* SPECIMEN HEMOLYZED. HEMOLYSIS MAY AFFECT INTEGRITY OF RESULTS. 1.8* 1.5*    TUMOR MARKERS: No results for input(s): AFPTM, CEA, CA199, CHROMGRNA in the last 8760 hours.  Assessment and Plan:  Request for  image guided abd wall fluid collection aspiration approved by Dr. Jennefer, planned 10/9 in afternoon, pt to be NPO from breakfast on (RN aware).  No contraindications for procedure identified in ROS, physical exam, or review of pre-sedation considerations. 10/8 CTA imaging available and reviewed VSS, improved from presentation, afebrile ASA ok not held, lovenox held  Risks and benefits of aspiration were discussed with the patient including bleeding, infection, damage to adjacent structures, and sepsis.  All of the  patient's questions were answered, patient is agreeable to proceed. Consent signed and in chart.   Thank you for allowing our service to participate in MIKAELE STECHER 's care.    Electronically Signed: Laymon Coast, NP   07/11/2024, 10:17 AM     I spent a total of 20 Minutes  in face to face in clinical consultation, greater than 50% of which was counseling/coordinating care for image guided abd wall fluid collection asp.    (A copy of this note was sent to the referring provider and the time of visit.)

## 2024-07-11 NOTE — Consult Note (Addendum)
 MACARIO SHEAR May 03, 1934  [id number].    Requesting MD: Lee, MD Chief Complaint/Reason for Consult: Abdominal wall fluid collection  HPI:  Ronald Kaiser is an 89 year old male with a past medical history of A-fib not on anticoagulation, CAD, hypertension, hyperlipidemia, cardiomyopathy, and metastatic prostate cancer s/p radiation therapy who presents to the emergency department with lower abdominal pain as well as pain radiating down his left anterior leg.  He states his abdominal pain has been present for about a month.  He reports progressive, generalized weakness at home and recurrent falls.  He was seen by our general surgery service during an admission 06/08/2024 where we suspected a rectus sheath hematoma after a fall that occurred at home.  Patient reports the area has become more tender, he is unable to comment on any redness or warmth in the area.  Patient states that, despite physical therapy at home, he feels worn out and is having more and more trouble standing up from sitting.  Reports a fall 3 days ago where he stool up from the couch, felt like he was going to pass out, so he tucked and rolled.  His urologist is Dr. Nieves.  Of note he underwent colonoscopy 05/2024 for rectal bleeding but a pediatric colonoscope was unable to traverse the distal sigmoid colon, where a benign-appearing stricture was noted, due to restrictive changes from radiation changes of the pelvis.  Patient denies rectal bleeding today.  At baseline he lives at home with his wife.  ROS: Review of Systems  All other systems reviewed and are negative.   Family History  Problem Relation Age of Onset   Hypertension Father    Breast cancer Neg Hx    Prostate cancer Neg Hx    Colon cancer Neg Hx    Pancreatic cancer Neg Hx     Past Medical History:  Diagnosis Date   AKI (acute kidney injury) 11/12/2018   CAD (coronary artery disease)    CHF (congestive heart failure) (HCC)    Dyspnea    Foley  catheter in place    HTN (hypertension)    Hyperlipidemia    Myocardial infarction (HCC) 2000   REPORTS HE HAS HAD 5 HEART ATTACKS BUT SOME OF THE WERE NOT SERIOUS    Obesity    Prostate cancer [facility])     Past Surgical History:  Procedure Laterality Date   CARDIAC CATHETERIZATION N/A 03/09/2016   Procedure: Left Heart Cath and Coronary Angiography;  Surgeon: Victory LELON Sharps, MD;  Location: [facility] INVASIVE CV LAB;  Service: Cardiovascular;  Laterality: N/A;   COLONOSCOPY N/A 05/28/2024   Procedure: COLONOSCOPY;  Surgeon: Leigh Elspeth SQUIBB, MD;  Location: [facility] ENDOSCOPY;  Service: Gastroenterology;  Laterality: N/A;   CYSTOSCOPY W/ RETROGRADES Bilateral 08/19/2020   Procedure: BILATERAL RETROGRADE PYELOGRAM;  Surgeon: Cam Morene LELON, MD;  Location: [facility] ORS;  Service: Urology;  Laterality: Bilateral;   ESOPHAGOGASTRODUODENOSCOPY N/A 05/28/2024   Procedure: EGD (ESOPHAGOGASTRODUODENOSCOPY);  Surgeon: Leigh Elspeth SQUIBB, MD;  Location: [facility] ENDOSCOPY;  Service: Gastroenterology;  Laterality: N/A;   RADIATION TO PROSTATE     Stenting of LAD  2000   TONSILLECTOMY     TRANSURETHRAL RESECTION OF PROSTATE N/A 03/01/2019   Procedure: TRANSURETHRAL RESECTION OF THE PROSTATE (TURP);  Surgeon: Cam Morene LELON, MD;  Location: [facility] ORS;  Service: Urology;  Laterality: N/A;  75 MINS   TRANSURETHRAL RESECTION OF PROSTATE N/A 08/19/2020   Procedure: TRANSURETHRAL RESECTION OF THE PROSTATE (TURP);  Surgeon: Cam Morene LELON, MD;  Location: [facility]S;  Service: Urology;  Laterality: N/A;    Social History:  reports that he quit smoking about 61 years ago. His smoking use included cigarettes. He started smoking about 68 years ago. He has a 3.5 pack-year smoking history. He has never used smokeless tobacco. He reports current alcohol use. He reports that he does not currently use drugs.  Allergies:  Allergies  Allergen Reactions   Bactrim [Sulfamethoxazole-Trimethoprim] Hives, Shortness Of Breath and Swelling    Augmentin [Amoxicillin-Pot Clavulanate] Diarrhea   Crestor  [Rosuvastatin  Calcium ] Other (See Comments)    Back pain Grogginess  Dizziness    Zetia  [Ezetimibe ] Other (See Comments)    Back pain Grogginess  Dizziness     (Not in a hospital admission)    Physical Exam: Blood pressure (!) 96/51, pulse 84, temperature 98.6 F (37 C), temperature source Oral, resp. rate 17, height 5' 11 (1.803 m), weight 64 kg, SpO2 99%. General: Pleasant white male, appears stated age, laying on hospital bed,  NAD. HEENT: head -normocephalic, atraumatic; Eyes: PERRLA, no conjunctival injection Neck- Trachea is midline CV- RRR, heart rate 70s, normal S1/S2, no M/R/G, no lower extremity edema Pulm- breathing is non-labored. ORA Abd- soft, nondistended, there is firmness of the lower abdominal wall, there is a roughly 4.5 x 1.5 cm area of blanching erythema inferior to the umbilicus.  GU-normal penile anatomy MSK- UE/LE symmetrical, no cyanosis, clubbing, or edema. Neuro-nonfocal exam, normal speech Psych- Alert and Oriented x3 with appropriate affect Skin: warm and dry, no rashes or lesions   Results for orders placed or performed during the hospital encounter of 07/10/24 (from the past 48 hours)  Comprehensive metabolic panel     Status: None   Collection Time: 07/10/24  6:46 PM  Result Value Ref Range   Sodium  135 - 145 mmol/L    SPECIMEN HEMOLYZED. HEMOLYSIS MAY AFFECT INTEGRITY OF RESULTS.    Comment: NOTIFIED E.SHAFFER, CHARGE RN @2054  07/10/2024 VANG.J CORRECTED ON 10/08 AT 2054: PREVIOUSLY REPORTED AS 130    Potassium  3.5 - 5.1 mmol/L    SPECIMEN HEMOLYZED. HEMOLYSIS MAY AFFECT INTEGRITY OF RESULTS.    Comment: NOTIFIED E.SHAFFER, CHARGE RN @2054  07/10/2024 VANG.J CORRECTED ON 10/08 AT 2054: PREVIOUSLY REPORTED AS 5.8    Chloride  98 - 111 mmol/L    SPECIMEN HEMOLYZED. HEMOLYSIS MAY AFFECT INTEGRITY OF RESULTS.    Comment: NOTIFIED E.SHAFFER, CHARGE RN @2054  07/10/2024  VANG.J CORRECTED ON 10/08 AT 2054: PREVIOUSLY REPORTED AS 97    CO2  22 - 32 mmol/L    SPECIMEN HEMOLYZED. HEMOLYSIS MAY AFFECT INTEGRITY OF RESULTS.    Comment: NOTIFIED E.SHAFFER, CHARGE RN @2054  07/10/2024 VANG.J CORRECTED ON 10/08 AT 2054: PREVIOUSLY REPORTED AS 19    Glucose, Bld  70 - 99 mg/dL    SPECIMEN HEMOLYZED. HEMOLYSIS MAY AFFECT INTEGRITY OF RESULTS.    Comment: NOTIFIED E.SHAFFER, CHARGE RN @2054  07/10/2024 VANG.J CORRECTED ON 10/08 AT 2054: PREVIOUSLY REPORTED AS 119 Glucose reference range applies only to samples taken after fasting for at least 8 hours.    BUN  8 - 23 mg/dL    SPECIMEN HEMOLYZED. HEMOLYSIS MAY AFFECT INTEGRITY OF RESULTS.    Comment: NOTIFIED E.SHAFFER, CHARGE RN @2054  07/10/2024 VANG.J CORRECTED ON 10/08 AT 2054: PREVIOUSLY REPORTED AS 42    Creatinine, Ser  0.61 - 1.24 mg/dL    SPECIMEN HEMOLYZED. HEMOLYSIS MAY AFFECT INTEGRITY OF RESULTS.    Comment: NOTIFIED E.SHAFFER, CHARGE RN @2054  07/10/2024 VANG.J CORRECTED ON 10/08 AT 2054: PREVIOUSLY REPORTED  AS 1.15    Calcium   8.9 - 10.3 mg/dL    SPECIMEN HEMOLYZED. HEMOLYSIS MAY AFFECT INTEGRITY OF RESULTS.    Comment: NOTIFIED E.SHAFFER, CHARGE RN @2054  07/10/2024 VANG.J CORRECTED ON 10/08 AT 2054: PREVIOUSLY REPORTED AS 8.1    Total Protein  6.5 - 8.1 g/dL    SPECIMEN HEMOLYZED. HEMOLYSIS MAY AFFECT INTEGRITY OF RESULTS.    Comment: NOTIFIED E.SHAFFER, CHARGE RN @2054  07/10/2024 VANG.J CORRECTED ON 10/08 AT 2054: PREVIOUSLY REPORTED AS 7.2    Albumin  3.5 - 5.0 g/dL    SPECIMEN HEMOLYZED. HEMOLYSIS MAY AFFECT INTEGRITY OF RESULTS.    Comment: NOTIFIED E.SHAFFER, CHARGE RN @2054  07/10/2024 VANG.J CORRECTED ON 10/08 AT 2054: PREVIOUSLY REPORTED AS 2.0    AST  15 - 41 U/L    SPECIMEN HEMOLYZED. HEMOLYSIS MAY AFFECT INTEGRITY OF RESULTS.    Comment: NOTIFIED E.SHAFFER, CHARGE RN @2054  07/10/2024 VANG.J   ALT  0 - 44 U/L    SPECIMEN HEMOLYZED. HEMOLYSIS MAY AFFECT INTEGRITY OF RESULTS.     Comment: NOTIFIED E.SHAFFER, CHARGE RN @2054  07/10/2024 VANG.J   Alkaline Phosphatase  38 - 126 U/L    SPECIMEN HEMOLYZED. HEMOLYSIS MAY AFFECT INTEGRITY OF RESULTS.    Comment: NOTIFIED E.SHAFFER, CHARGE RN @2054  07/10/2024 VANG.J   Total Bilirubin  0.0 - 1.2 mg/dL    SPECIMEN HEMOLYZED. HEMOLYSIS MAY AFFECT INTEGRITY OF RESULTS.    Comment: NOTIFIED E.SHAFFER, CHARGE RN @2054  07/10/2024 VANG.J   GFR, Estimated  >60 mL/min    SPECIMEN HEMOLYZED. HEMOLYSIS MAY AFFECT INTEGRITY OF RESULTS.    Comment: NOTIFIED E.SHAFFER, CHARGE RN @2054  07/10/2024 VANG.J CORRECTED ON 10/08 AT 2054: PREVIOUSLY REPORTED AS >60    GFR calc Af Amer  >60 mL/min    SPECIMEN HEMOLYZED. HEMOLYSIS MAY AFFECT INTEGRITY OF RESULTS.    Comment: NOTIFIED E.SHAFFER, CHARGE RN @2054  07/10/2024 VANG.J   Anion gap  5 - 15    SPECIMEN HEMOLYZED. HEMOLYSIS MAY AFFECT INTEGRITY OF RESULTS.    Comment: NOTIFIED E.SHAFFER, CHARGE RN @2054  07/10/2024 VANG.J Performed at [facility], [address], [place], [place] [address]   CBC with Differential     Status: Abnormal   Collection Time: 07/10/24  6:46 PM  Result Value Ref Range   WBC 23.2 (H) 4.0 - 10.5 K/uL   RBC 3.00 (L) 4.22 - 5.81 MIL/uL   Hemoglobin 9.2 (L) 13.0 - 17.0 g/dL   HCT 70.9 (L) 60.9 - 47.9 %   MCV 96.7 80.0 - 100.0 fL   MCH 30.7 26.0 - 34.0 pg   MCHC 31.7 30.0 - 36.0 g/dL   RDW 81.8 (H) 88.4 - 84.4 %   Platelets PLATELET CLUMPS NOTED ON SMEAR, UNABLE TO ESTIMATE 150 - 400 K/uL    Comment: PLATELET CLUMPS NOTED ON SMEAR, UNABLE TO ESTIMATE   nRBC 0.0 0.0 - 0.2 %   Neutrophils Relative % 87 %   Neutro Abs 20.2 (H) 1.7 - 7.7 K/uL   Lymphocytes Relative 4 %   Lymphs Abs 0.9 0.7 - 4.0 K/uL   Monocytes Relative 9 %   Monocytes Absolute 2.1 (H) 0.1 - 1.0 K/uL   Eosinophils Relative 0 %   Eosinophils Absolute 0.0 0.0 - 0.5 K/uL   Basophils Relative 0 %   Basophils Absolute 0.0 0.0 - 0.1 K/uL   WBC Morphology See Note     Comment: PELGEROID  CHANGES  Increased Bands. >20% Bands    Polychromasia PRESENT     Comment: Performed at [facility]  Lab, [address], [place], [place] [address]  Troponin I (High Sensitivity)     Status: Abnormal   Collection Time: 07/10/24  6:46 PM  Result Value Ref Range   Troponin I (High Sensitivity) 58 (H) <18 ng/L    Comment: (NOTE) Elevated high sensitivity troponin I (hsTnI) values and significant  changes across serial measurements may suggest ACS but many other  chronic and acute conditions are known to elevate hsTnI results.  Refer to the Links section for chest pain algorithms and additional  guidance. Performed at [facility], [address] [place], [place] [address]   Brain natriuretic peptide     Status: Abnormal   Collection Time: 07/10/24  6:46 PM  Result Value Ref Range   B Natriuretic Peptide 857.5 (H) 0.0 - 100.0 pg/mL    Comment: Performed at [facility], [address] [place], [place] [address]  Protime-INR     Status: Abnormal   Collection Time: 07/10/24  7:13 PM  Result Value Ref Range   Prothrombin Time 15.9 (H) 11.4 - 15.2 seconds   INR 1.2 0.8 - 1.2    Comment: (NOTE) INR goal varies based on device and disease states. Performed at [facility], [address] [place], [place] [address]   I-Stat Lactic Acid, ED     Status: Abnormal   Collection Time: 07/10/24  7:40 PM  Result Value Ref Range   Lactic Acid, Venous 3.1 (HH) 0.5 - 1.9 mmol/L   Comment NOTIFIED PHYSICIAN   Troponin I (High Sensitivity)     Status: Abnormal   Collection Time: 07/10/24  9:19 PM  Result Value Ref Range   Troponin I (High Sensitivity) 82 (H) <18 ng/L    Comment: RESULT CALLED TO, READ BACK BY AND VERIFIED WITH CORDELLA HERO, RN 2236 07/10/2024 SANDOVAL K (NOTE) Elevated high sensitivity troponin I (hsTnI) values and significant  changes across serial measurements may suggest ACS but many other  chronic and acute conditions are known to elevate hsTnI  results.  Refer to the Links section for chest pain algorithms and additional  guidance. Performed at [facility] Lab, [address], [place], [place] [address]   CK     Status: Abnormal   Collection Time: 07/10/24  9:19 PM  Result Value Ref Range   Total CK 24 (L) 49 - 397 U/L    Comment: Performed at [facility], [address] [place], [place] [address]  Comprehensive metabolic panel with GFR     Status: Abnormal   Collection Time: 07/10/24  9:19 PM  Result Value Ref Range   Sodium 133 (L) 135 - 145 mmol/L   Potassium 4.2 3.5 - 5.1 mmol/L   Chloride 98 98 - 111 mmol/L   CO2 22 22 - 32 mmol/L   Glucose, Bld 128 (H) 70 - 99 mg/dL    Comment: Glucose reference range applies only to samples taken after fasting for at least 8 hours.   BUN 41 (H) 8 - 23 mg/dL   Creatinine, Ser 8.95 0.61 - 1.24 mg/dL   Calcium  7.9 (L) 8.9 - 10.3 mg/dL   Total Protein 6.2 (L) 6.5 - 8.1 g/dL   Albumin 1.8 (L) 3.5 - 5.0 g/dL   AST 22 15 - 41 U/L   ALT 17 0 - 44 U/L   Alkaline Phosphatase 61 38 - 126 U/L   Total Bilirubin 0.4 0.0 - 1.2 mg/dL   GFR, Estimated >39 >39  mL/min    Comment: (NOTE) Calculated using the CKD-EPI Creatinine Equation (2021)    Anion gap 13 5 - 15    Comment: Performed at [facility], [address], [place], [place] [address]  CBC     Status: Abnormal   Collection Time: 07/10/24  9:19 PM  Result Value Ref Range   WBC 22.7 (H) 4.0 - 10.5 K/uL   RBC 2.62 (L) 4.22 - 5.81 MIL/uL   Hemoglobin 8.0 (L) 13.0 - 17.0 g/dL   HCT 74.7 (L) 60.9 - 47.9 %   MCV 96.2 80.0 - 100.0 fL   MCH 30.5 26.0 - 34.0 pg   MCHC 31.7 30.0 - 36.0 g/dL   RDW 81.9 (H) 88.4 - 84.4 %   Platelets 360 150 - 400 K/uL   nRBC 0.0 0.0 - 0.2 %    Comment: Performed at [facility], [address] [place], [place] [address]  I-Stat Lactic Acid, ED     Status: None   Collection Time: 07/10/24  9:28 PM  Result Value Ref Range   Lactic Acid, Venous 1.0 0.5 - 1.9 mmol/L  Urinalysis,  w/ Reflex to Culture (Infection Suspected) -Urine, Clean Catch     Status: Abnormal   Collection Time: 07/10/24 11:59 PM  Result Value Ref Range   Specimen Source URINE, CLEAN CATCH    Color, Urine YELLOW YELLOW   APPearance TURBID (A) CLEAR   Specific Gravity, Urine 1.024 1.005 - 1.030   pH 7.0 5.0 - 8.0   Glucose, UA NEGATIVE NEGATIVE mg/dL   Hgb urine dipstick SMALL (A) NEGATIVE   Bilirubin Urine NEGATIVE NEGATIVE   Ketones, ur NEGATIVE NEGATIVE mg/dL   Protein, ur 899 (A) NEGATIVE mg/dL   Nitrite NEGATIVE NEGATIVE   Leukocytes,Ua SMALL (A) NEGATIVE   RBC / HPF 6-10 0 - 5 RBC/hpf   WBC, UA >50 0 - 5 WBC/hpf    Comment:        Reflex urine culture not performed if WBC <=10, OR if Squamous epithelial cells >5. If Squamous epithelial cells >5 suggest recollection.    Bacteria, UA FEW (A) NONE SEEN   Squamous Epithelial / HPF 0-5 0 - 5 /HPF   WBC Clumps PRESENT    Mucus PRESENT     Comment: Performed at [facility], [address], [place], [place] [address]  CBC     Status: Abnormal   Collection Time: 07/11/24  5:37 AM  Result Value Ref Range   WBC 18.8 (H) 4.0 - 10.5 K/uL   RBC 2.34 (L) 4.22 - 5.81 MIL/uL   Hemoglobin 7.2 (L) 13.0 - 17.0 g/dL   HCT 77.1 (L) 60.9 - 47.9 %   MCV 97.4 80.0 - 100.0 fL   MCH 30.8 26.0 - 34.0 pg   MCHC 31.6 30.0 - 36.0 g/dL   RDW 81.7 (H) 88.4 - 84.4 %   Platelets 309 150 - 400 K/uL   nRBC 0.0 0.0 - 0.2 %    Comment: Performed at [facility], [address], [place], [place] [address]  Comprehensive metabolic panel     Status: Abnormal   Collection Time: 07/11/24  5:37 AM  Result Value Ref Range   Sodium 135 135 - 145 mmol/L   Potassium 3.7 3.5 - 5.1 mmol/L   Chloride 100 98 - 111 mmol/L   CO2 23 22 - 32 mmol/L   Glucose, Bld 115 (H) 70 - 99 mg/dL    Comment:  Glucose reference range applies only to samples taken after fasting for at least 8 hours.   BUN 33 (H) 8 - 23 mg/dL   Creatinine, Ser 9.09 0.61 - 1.24 mg/dL    Calcium  7.9 (L) 8.9 - 10.3 mg/dL   Total Protein 5.5 (L) 6.5 - 8.1 g/dL   Albumin 1.5 (L) 3.5 - 5.0 g/dL   AST 15 15 - 41 U/L   ALT 15 0 - 44 U/L   Alkaline Phosphatase 55 38 - 126 U/L   Total Bilirubin 0.4 0.0 - 1.2 mg/dL   GFR, Estimated >39 >39 mL/min    Comment: (NOTE) Calculated using the CKD-EPI Creatinine Equation (2021)    Anion gap 12 5 - 15    Comment: Performed at [facility], [address] [place], [place] [address]   CT Angio Chest/Abd/Pel for Dissection W and/or Wo Contrast Result Date: 07/10/2024 CLINICAL DATA:  Acute aortic syndrome suspected. Shortness of breath. EXAM: CT ANGIOGRAPHY CHEST, ABDOMEN AND PELVIS TECHNIQUE: Non-contrast CT of the chest was initially obtained. Multidetector CT imaging through the chest, abdomen and pelvis was performed using the standard protocol during bolus administration of intravenous contrast. Multiplanar reconstructed images and MIPs were obtained and reviewed to evaluate the vascular anatomy. RADIATION DOSE REDUCTION: This exam was performed according to the departmental dose-optimization program which includes automated exposure control, adjustment of the mA and/or kV according to patient size and/or use of iterative reconstruction technique. CONTRAST:  75mL OMNIPAQUE  IOHEXOL  350 MG/ML SOLN COMPARISON:  Chest radiograph 07/10/2024. CT abdomen and pelvis 05/26/2024. CT chest 03/04/2022. PET CT 12/13/2022 FINDINGS: CTA CHEST FINDINGS Cardiovascular: Unenhanced images of the chest demonstrate diffuse calcification throughout the aorta and coronary arteries. No acute intramural hematoma. Images obtained during arterial phase after intravenous contrast material administration demonstrate patent common normal caliber thoracic aorta. No aortic dissection. Great vessel origins are patent. No filling defects in the central pulmonary arteries. No evidence of significant pulmonary embolus. Normal heart size although the left ventricle appears mildly  dilated. No pericardial effusion. Mediastinum/Nodes: Esophagus is decompressed. No significant lymphadenopathy. Thyroid gland is unremarkable. Lungs/Pleura: Emphysematous changes in the lungs. Bilateral basilar atelectasis. Peribronchial thickening with mild bronchiectasis consistent with chronic bronchitis. No pleural effusion or pneumothorax. Musculoskeletal: Degenerative changes in the spine. Compression of T11 and T12 vertebra, unchanged. Sclerotic lesions demonstrated in several thoracic vertebrae and ribs corresponding to known metastatic disease. Review of the MIP images confirms the above findings. CTA ABDOMEN AND PELVIS FINDINGS VASCULAR Aorta: Normal caliber aorta without aneurysm, dissection, vasculitis or significant stenosis. Diffuse aortic calcification. Celiac: Patent without evidence of aneurysm, dissection, vasculitis or significant stenosis. SMA: Patent without evidence of aneurysm, dissection, vasculitis or significant stenosis. Renals: Both renal arteries are patent without evidence of aneurysm, dissection, vasculitis, fibromuscular dysplasia or significant stenosis. IMA: Patent without evidence of aneurysm, dissection, vasculitis or significant stenosis. Inflow: Patent without evidence of aneurysm, dissection, vasculitis or significant stenosis. Veins: No obvious venous abnormality within the limitations of this arterial phase study. Review of the MIP images confirms the above findings. NON-VASCULAR Hepatobiliary: No focal liver abnormality is seen. No gallstones, gallbladder wall thickening, or biliary dilatation. Pancreas: Unremarkable. No pancreatic ductal dilatation or surrounding inflammatory changes. Spleen: Normal in size without focal abnormality. Adrenals/Urinary Tract: No adrenal gland nodules. Renal nephrograms are symmetrical but there is bilateral hydronephrosis and hydroureter down to the level of the bladder. No ureteral stones are identified. Changes likely indicate reflux  disease or bladder outlet obstruction. Diffuse bladder wall thickening likely  to represent radiation change given history of prostate cancer. Bladder outlet obstruction could also have this appearance. Coarse calcifications in the dependent portion of the bladder or possibly in the bladder wall or prostate. This is unchanged since prior study. Stomach/Bowel: Stomach, small bowel, and colon are not abnormally distended. Stool throughout the colon. No wall thickening or inflammatory changes. Appendix is not identified. Lymphatic: No significant lymphadenopathy. Reproductive: Prostate gland is not well visualized and may be surgically absent or atrophic due to radiation therapy. In the prostate bed, there is increased soft tissue which extends into the anterolateral left pelvic sidewall and the left proximal hip adductor muscles. This likely represents residual tumor or post treatment changes. Pelvic soft tissue infiltration is increasing since the previous studies. Loss of fat plane between the soft tissues and the bladder. Pelvic sidewall soft tissue today measures 3.2 cm thickness. Other: No free air or free fluid in the abdomen. There is a large heterogeneous collection in the inferior left and central rectus abdominus muscles extending down to the symphysis pubis. This collection measures up to about 5.2 cm diameter. Stranding in the surrounding subcutaneous fat. This is likely an abscess. Musculoskeletal: Degenerative changes in the spine and hips. Scattered focal areas of bone sclerosis consistent with known metastasis. Cortical erosion at the symphysis pubis may represent post treatment changes or osteomyelitis. Review of the MIP images confirms the above findings. IMPRESSION: 1. Aortic atherosclerosis. No evidence of aneurysm or dissection involving the thoracic or abdominal aorta. 2. Overall normal heart size with left ventricular dilatation. 3. Atelectasis in the lung bases. 4. Increasing soft tissue  mass/infiltration in the low pelvis involving the prostate bed and extending to the left pelvic sidewall and into the proximal left adductor muscles. This likely represents residual recurrent tumor versus post treatment changes or possibly inflammatory process. 5. Diffuse bladder wall thickening may be due to bladder outlet obstruction or treatment changes. Stable calcification in the bladder or prostate region. Bilateral hydronephrosis and hydroureter likely related to reflux or bladder outlet obstruction. 6. Low anterior rectus abdominus collection with surrounding stranding likely representing abscess. 7. Cortical erosion at the symphysis pubis may represent osteomyelitis or possibly radiation changes. 8. Multiple sclerotic bone lesions consistent with known metastatic disease. Electronically Signed   By: Elsie Gravely M.D.   On: 07/10/2024 23:03   DG Chest Port 1 View Result Date: 07/10/2024 CLINICAL DATA:  Question of sepsis to evaluate for abnormality. Diffuse abdominal pain for 2 weeks radiating down the legs. EXAM: PORTABLE CHEST 1 VIEW COMPARISON:  06/06/2024 FINDINGS: Shallow inspiration. Heart size and pulmonary vascularity are normal for technique. Peribronchial thickening with bronchiectasis in streaky perihilar opacities likely representing bronchitic changes. This could represent acute or chronic bronchitis. Scarring in the lung apices and bases. Probable linear atelectasis also in the lung bases. No pleural effusion or pneumothorax. Mediastinal contours appear intact. Calcification of the aorta. Poorly defined sclerotic lesions demonstrated in the scapula and ribs correlating with known metastatic disease. IMPRESSION: 1. Shallow inspiration. 2. Emphysematous changes, fibrosis, and bronchitic changes in the lungs. 3. Mild linear atelectasis in the lung bases. No focal consolidation. 4. Sclerotic bone lesions corresponding to known metastatic disease. Electronically Signed   By: Elsie Gravely  M.D.   On: 07/10/2024 19:37      Assessment/Plan Infected abdominal wall hematoma versus abdominal wall abscess 89 year old male with evidence of progressing metastatic prostate cancer who presents with worsening abdominal wall pain in the setting of known abdominal wall fluid collection, suspect infected  rectus sheath hematoma versus abscess.  He was seen about 1 month ago and at that time had no evidence of infection so was monitored to ensure no evidence of ongoing bleeding.  He now has mild abdominal wall cellulitis.  At present he is hemodynamically stable with a leukocytosis of 18,000.  This collection is deep and extends all the way down to the pubic symphysis.  Will place IR consult for aspiration versus drain placement along with cultures.  The patient did eat breakfast this morning which may inhibit his ability to have any procedures today.  Continue IV antibiotics.   I reviewed nursing notes, ED provider notes, hospitalist notes, last 24 h vitals and pain scores, last 48 h intake and output, last 24 h labs and trends, and last 24 h imaging results.  Almarie GORMAN Pringle, [facility] 07/11/2024, 8:08 AM Please see Amion for pager number during day hours 7:00am-4:30pm or 7:00am -11:30am on weekends

## 2024-07-11 NOTE — Hospital Course (Addendum)
 Brief Narrative:   89 year old with history of atrial fibrillation not on anticoagulation, HLD, HTN, CHF EF 35%, chronic hypotension on midodrine , CAD, metastatic prostate cancer status post radiation therapy comes to the ED with complaints of lower abdominal pain radiating down anterior leg.  Was admitted in September 2025 for suspected rectus sheath hematoma after a fall at home.  CTA chest is negative for PE and further workup reveals increase in soft tissue mass/infiltration of the lower pelvic involving prostate bed extending into left pelvic sidewall.  Also noted to have possible abscess surrounding the anterior rectus abdominis.  General surgery has been consulted.  Assessment & Plan:  Sepsis secondary to abdominal wall hematoma versus abscess -Had a fall last month causing rectus sheath hematoma and admitted to the hospital thereafter discharged home.  Suspect this area could be infected now with concerns of mild cellulitis.  Seen by general surgery.  Continue broad-spectrum antibiotics, will further tailor as necessary.  IR consulted for aspiration/drainage  Acute blood loss anemia - Baseline hemoglobin around 9, this morning 7.2.  Due to underlying cardiac history will go ahead and give 1 unit of blood    Acute on chronic CHF exacerbation HFrEF 30 to 35% Chronic hypotension-on midodrine  - Concern of possible mild exacerbation but in the setting of sepsis monitoring volume status and holding off on diuretics.  Currently patient is on room air.  Discontinue fluids. - On midodrine    Elevated troponin-secondary to demand ischemia - Likely from demand ischemia in the setting of sepsis.  Echocardiogram ordered   Paroxysmal atrial fibrillation-not on anticoagulation Previously declined also patient is at risk of falls.   History of CAD -Continue aspirin  and pravastatin .  Given patient has history of hypotension limiting use of rate control agent.   History of metastatic prostate  cancer Bladder stone Bilateral hydronephrosis and hydroureter Acute cystitis - Appears patient has seen Dr. Nieves in the past from [facility] thereafter Dr. Patrcia from radiation oncology.  CT scan now shows worsening of his malignancy versus radiation changes.  I have discussed this with urology who will see the patient or arrange further follow-up. -I did review records within our system and Care Everywhere but there appears to be quite a bit of a gap as I do not have access to outpatient urology EMR.   DVT prophylaxis: enoxaparin (LOVENOX) injection 40 mg Start: 07/11/24 1000 SCDs Start: 07/11/24 0047 Place TED hose Start: 07/11/24 0047      Code Status: Full Code Family Communication:   Status is: Inpatient Remains inpatient appropriate because: Continue hospital stay for rectus sheath hematoma management concerning for abscess   PT Follow up Recs:   Subjective:  Feels better, some tenderness on the abdomen. Tells me he saw his urologist about a month ago  Examination:  General exam: Appears calm and comfortable  Respiratory system: Clear to auscultation. Respiratory effort normal. Cardiovascular system: S1 & S2 heard, RRR. No JVD, murmurs, rubs, gallops or clicks. No pedal edema. Gastrointestinal system: Slight abdominal wall tenderness.  Positive bowel sounds. Central nervous system: Alert and oriented. No focal neurological deficits. Extremities: Symmetric 5 x 5 power. Skin: No rashes, lesions or ulcers Psychiatry: Judgement and insight appear normal. Mood & affect appropriate.

## 2024-07-11 NOTE — H&P (Signed)
 History and Physical    ZAE KIRTZ FMW:[id number] DOB: July 20, 1934 DOA: 07/10/2024  PCP: [name], [name]   Patient coming from: Home   Chief Complaint:  Chief Complaint  Patient presents with   Shortness of Breath   Abdominal Pain   ED TRIAGE note:  C/O lower abd pain radiates to bilateral legs for a week. C/O SHOB. Hx of afib. VSS axox4. Denies CP.             HPI:  Ronald Kaiser is a 89 y.o. male with medical history significant of CAD with cardiac stent placement, paroxysmal atrial fibrillation-previously declined anticoagulation, essential hypertension, hyperlipidemia, metastatic prostate cancer prostate cancer s/p radiation therapy, ischemic cardiomyopathy with reduced EF 30 to 35% and chronic hypotension on midodrine  presented emergency department complaining of abdominal pain radiation to bilateral lower extremity and associated shortness of breath.   Per chart review patient was recently admitted on 06/10/2024 for septic shock in the setting of UTI.  Urine culture remain negative so far.  During my evaluation at the bedside patient reported that he has been noticed lower abdominal pain for last 2 to 3 weeks.  Denies any fever, chill UTI symptoms.  Patient denying any shortness of breath and palpitation. Patient does not have any other complaint at this time.   ED Course:  At presentation to ED patient found tachycardic heart rate 104, tachypneic respiratory rate 29, borderline hypotensive.  O2 sat 99% room air.  Afebrile. Pending UA. CBC showing leukocytosis 23.2, stable H&H 9.8 and 29 and clamped platelet count. Elevated troponin 58 and 82 (baseline troponin is around 88-1 09. Elevated BNP 857. Blood cultures are in process. Elevated pro time normal INR. Elevated lactic acid level 3.1 which trended down to 1 without any IV fluid resuscitation.   EKG showing sinus tachycardia heart rate 105, right bundle branch block  CTA chest no evidence of pulmonary  embolism.  No evidence of aneurysm or dissection of the thoracic or abdominal aorta. IMPRESSION: 1. Aortic atherosclerosis. No evidence of aneurysm or dissection involving the thoracic or abdominal aorta. 2. Overall normal heart size with left ventricular dilatation. 3. Atelectasis in the lung bases. 4. Increasing soft tissue mass/infiltration in the low pelvis involving the prostate bed and extending to the left pelvic sidewall and into the proximal left adductor muscles. This likely represents residual recurrent tumor versus post treatment changes or possibly inflammatory process. 5. Diffuse bladder wall thickening may be due to bladder outlet obstruction or treatment changes. Stable calcification in the bladder or prostate region. Bilateral hydronephrosis and hydroureter likely related to reflux or bladder outlet obstruction. 6. Low anterior rectus abdominus collection with surrounding stranding likely representing abscess. 7. Cortical erosion at the symphysis pubis may represent osteomyelitis or possibly radiation changes. 8. Multiple sclerotic bone lesions consistent with known metastatic disease.   Chest x-ray: IMPRESSION: 1. Shallow inspiration. 2. Emphysematous changes, fibrosis, and bronchitic changes in the lungs. 3. Mild linear atelectasis in the lung bases. No focal consolidation. 4. Sclerotic bone lesions corresponding to known metastatic disease  In the setting of sepsis patient unable to resuscitated with IV fluid howeve at the same time concern for acute septic exacerbation in the setting of elevated BNP and shortness of breath.  In the ED patient has been treated with cefepime , vancomycin  and metronidazole.  Hospitalist has been consulted for further evaluation management of sepsis-from abdominal wall muscle abscess, acute on chronic CHF exacerbation, elevated troponin secondary demand ischemia.  Discussed case with on-call  general surgery Dr. Rubin who will  evaluate patient in the AM.  Significant labs in the ED: Lab Orders         Blood Culture (routine x 2)         Urine Culture (for pregnant, neutropenic or urologic patients or patients with an indwelling urinary catheter)         Comprehensive metabolic panel         CBC with Differential         Urinalysis, w/ Reflex to Culture (Infection Suspected) -Urine, Clean Catch         Brain natriuretic peptide         Protime-INR         CK         Comprehensive metabolic panel with GFR         Pathologist smear review         CBC         CBC         Comprehensive metabolic panel         I-Stat Lactic Acid, ED       Review of Systems:  Review of Systems  Constitutional:  Negative for chills, fever and weight loss.  Respiratory:  Negative for cough, sputum production and shortness of breath.   Cardiovascular:  Negative for chest pain and palpitations.  Gastrointestinal:  Negative for abdominal pain, heartburn, nausea and vomiting.       Lower abdominal wall pain  Genitourinary:  Negative for dysuria, flank pain, frequency, hematuria and urgency.  Musculoskeletal:  Negative for myalgias.  Neurological:  Negative for dizziness and headaches.  Psychiatric/Behavioral:  The patient is not nervous/anxious.     Past Medical History:  Diagnosis Date   AKI (acute kidney injury) 11/12/2018   CAD (coronary artery disease)    CHF (congestive heart failure) (HCC)    Dyspnea    Foley catheter in place    HTN (hypertension)    Hyperlipidemia    Myocardial infarction (HCC) 2000   REPORTS HE HAS HAD 5 HEART ATTACKS BUT SOME OF THE WERE NOT SERIOUS    Obesity    Prostate cancer [facility])     Past Surgical History:  Procedure Laterality Date   CARDIAC CATHETERIZATION N/A 03/09/2016   Procedure: Left Heart Cath and Coronary Angiography;  Surgeon: Victory LELON Sharps, MD;  Location: [facility] INVASIVE CV LAB;  Service: Cardiovascular;  Laterality: N/A;   COLONOSCOPY N/A 05/28/2024   Procedure: COLONOSCOPY;   Surgeon: Leigh Elspeth SQUIBB, MD;  Location: [facility] ENDOSCOPY;  Service: Gastroenterology;  Laterality: N/A;   CYSTOSCOPY W/ RETROGRADES Bilateral 08/19/2020   Procedure: BILATERAL RETROGRADE PYELOGRAM;  Surgeon: Cam Morene LELON, MD;  Location: [facility] ORS;  Service: Urology;  Laterality: Bilateral;   ESOPHAGOGASTRODUODENOSCOPY N/A 05/28/2024   Procedure: EGD (ESOPHAGOGASTRODUODENOSCOPY);  Surgeon: Leigh Elspeth SQUIBB, MD;  Location: [facility] ENDOSCOPY;  Service: Gastroenterology;  Laterality: N/A;   RADIATION TO PROSTATE     Stenting of LAD  2000   TONSILLECTOMY     TRANSURETHRAL RESECTION OF PROSTATE N/A 03/01/2019   Procedure: TRANSURETHRAL RESECTION OF THE PROSTATE (TURP);  Surgeon: Cam Morene LELON, MD;  Location: [facility] ORS;  Service: Urology;  Laterality: N/A;  75 MINS   TRANSURETHRAL RESECTION OF PROSTATE N/A 08/19/2020   Procedure: TRANSURETHRAL RESECTION OF THE PROSTATE (TURP);  Surgeon: Cam Morene LELON, MD;  Location: [facility] ORS;  Service: Urology;  Laterality: N/A;     reports that he quit smoking about 61 years ago.  His smoking use included cigarettes. He started smoking about 68 years ago. He has a 3.5 pack-year smoking history. He has never used smokeless tobacco. He reports current alcohol use. He reports that he does not currently use drugs.  Allergies  Allergen Reactions   Bactrim [Sulfamethoxazole-Trimethoprim] Hives, Shortness Of Breath and Swelling   Augmentin [Amoxicillin-Pot Clavulanate] Diarrhea   Crestor  [Rosuvastatin  Calcium ] Other (See Comments)    Back pain Grogginess  Dizziness    Zetia  [Ezetimibe ] Other (See Comments)    Back pain Grogginess  Dizziness     Family History  Problem Relation Age of Onset   Hypertension Father    Breast cancer Neg Hx    Prostate cancer Neg Hx    Colon cancer Neg Hx    Pancreatic cancer Neg Hx     Prior to Admission medications   Medication Sig Start Date End Date Taking? Authorizing Provider  aspirin  EC 81 MG tablet Take 81 mg  by mouth daily.   Yes [provider]  Calcium  Carb-Cholecalciferol (CALCIUM  + VITAMIN D3 PO) Take 1 tablet by mouth daily.   Yes [provider]  ferrous sulfate  325 (65 FE) MG EC tablet Take 325 mg by mouth daily with breakfast.   Yes [provider]  furosemide  (LASIX ) 20 MG tablet Take 20 mg by mouth as needed for fluid or edema.   Yes [provider]  Multiple Vitamins-Minerals (CENTRUM SILVER MEN 50+) TABS Take 1 tablet by mouth daily.   Yes [provider]  nitroGLYCERIN  (NITROSTAT ) 0.4 MG SL tablet Place 1 tablet (0.4 mg total) under the tongue every 5 (five) minutes as needed. 04/17/17  Yes Pietro Redell RAMAN, MD  pravastatin  (PRAVACHOL ) 20 MG tablet Take 1 tablet (20 mg total) by mouth every evening. 08/01/23  Yes Pietro Redell RAMAN, MD  AMBULATORY NON FORMULARY MEDICATION Sucralfate 50 mg suppositories PR twice daily x 14 days Patient not taking: Reported on 07/10/2024 05/28/24   Leigh Elspeth SQUIBB, MD  enzalutamide  (XTANDI ) 40 MG capsule Take 160 mg by mouth daily. Patient not taking: Reported on 07/10/2024    [provider]     Physical Exam: Vitals:   07/10/24 1755 07/10/24 1759 07/10/24 2000 07/10/24 2211  BP:  (!) 106/57 (!) 103/53 (!) 102/52  Pulse:  (!) 104 (!) 102 91  Resp:  (!) 23 17 (!) 29  Temp:  99.7 F (37.6 C)  98.6 F (37 C)  TempSrc:  Oral  Oral  SpO2:  99% 100% 96%  Weight: 64 kg     Height: 5' 11 (1.803 m)       Physical Exam Vitals and nursing note reviewed.  Constitutional:      Appearance: He is ill-appearing.  Cardiovascular:     Rate and Rhythm: Normal rate and regular rhythm.  Pulmonary:     Breath sounds: Normal breath sounds. No decreased breath sounds, wheezing, rhonchi or rales.  Abdominal:     Palpations: Abdomen is soft.     Comments: Suprapubic skin wall redness on palpation.  Skin:    Capillary Refill: Capillary refill takes less than 2 seconds.  Neurological:     Mental Status:  He is alert and oriented to person, place, and time.  Psychiatric:        Mood and Affect: Mood normal.      Labs on Admission: I have personally reviewed following labs and imaging studies  CBC: Recent Labs  Lab 07/10/24 1846 07/10/24 2119  WBC 23.2* 22.7*  NEUTROABS 20.2*  --   HGB 9.2* 8.0*  HCT 29.0* 25.2*  MCV 96.7 96.2  PLT PLATELET CLUMPS NOTED ON SMEAR, UNABLE TO ESTIMATE 360   Basic Metabolic Panel: Recent Labs  Lab 07/10/24 1846 07/10/24 2119  NA SPECIMEN HEMOLYZED. HEMOLYSIS MAY AFFECT INTEGRITY OF RESULTS. 133*  K SPECIMEN HEMOLYZED. HEMOLYSIS MAY AFFECT INTEGRITY OF RESULTS. 4.2  CL SPECIMEN HEMOLYZED. HEMOLYSIS MAY AFFECT INTEGRITY OF RESULTS. 98  CO2 SPECIMEN HEMOLYZED. HEMOLYSIS MAY AFFECT INTEGRITY OF RESULTS. 22  GLUCOSE SPECIMEN HEMOLYZED. HEMOLYSIS MAY AFFECT INTEGRITY OF RESULTS. 128*  BUN SPECIMEN HEMOLYZED. HEMOLYSIS MAY AFFECT INTEGRITY OF RESULTS. 41*  CREATININE SPECIMEN HEMOLYZED. HEMOLYSIS MAY AFFECT INTEGRITY OF RESULTS. 1.04  CALCIUM  SPECIMEN HEMOLYZED. HEMOLYSIS MAY AFFECT INTEGRITY OF RESULTS. 7.9*   GFR: Estimated Creatinine Clearance: 45.3 mL/min (by C-G formula based on SCr of 1.04 mg/dL). Liver Function Tests: Recent Labs  Lab 07/10/24 1846 07/10/24 2119  AST SPECIMEN HEMOLYZED. HEMOLYSIS MAY AFFECT INTEGRITY OF RESULTS. 22  ALT SPECIMEN HEMOLYZED. HEMOLYSIS MAY AFFECT INTEGRITY OF RESULTS. 17  ALKPHOS SPECIMEN HEMOLYZED. HEMOLYSIS MAY AFFECT INTEGRITY OF RESULTS. 61  BILITOT SPECIMEN HEMOLYZED. HEMOLYSIS MAY AFFECT INTEGRITY OF RESULTS. 0.4  PROT SPECIMEN HEMOLYZED. HEMOLYSIS MAY AFFECT INTEGRITY OF RESULTS. 6.2*  ALBUMIN SPECIMEN HEMOLYZED. HEMOLYSIS MAY AFFECT INTEGRITY OF RESULTS. 1.8*   No results for input(s): LIPASE, AMYLASE in the last 168 hours. No results for input(s): AMMONIA in the last 168 hours. Coagulation Profile: Recent Labs  Lab 07/10/24 1913  INR 1.2   Cardiac Enzymes: Recent Labs  Lab  07/10/24 1846 07/10/24 2119  CKTOTAL  --  24*  TROPONINIHS 58* 82*   BNP (last 3 results) Recent Labs    05/26/24 1218 07/10/24 1846  BNP 345.7* 857.5*   HbA1C: No results for input(s): HGBA1C in the last 72 hours. CBG: No results for input(s): GLUCAP in the last 168 hours. Lipid Profile: No results for input(s): CHOL, HDL, LDLCALC, TRIG, CHOLHDL, LDLDIRECT in the last 72 hours. Thyroid Function Tests: No results for input(s): TSH, T4TOTAL, FREET4, T3FREE, THYROIDAB in the last 72 hours. Anemia Panel: No results for input(s): VITAMINB12, FOLATE, FERRITIN, TIBC, IRON , RETICCTPCT in the last 72 hours. Urine analysis:    Component Value Date/Time   COLORURINE YELLOW 07/10/2024 2359   APPEARANCEUR TURBID (A) 07/10/2024 2359   LABSPEC 1.024 07/10/2024 2359   PHURINE 7.0 07/10/2024 2359   GLUCOSEU NEGATIVE 07/10/2024 2359   HGBUR SMALL (A) 07/10/2024 2359   BILIRUBINUR NEGATIVE 07/10/2024 2359   KETONESUR NEGATIVE 07/10/2024 2359   PROTEINUR 100 (A) 07/10/2024 2359   NITRITE NEGATIVE 07/10/2024 2359   LEUKOCYTESUR SMALL (A) 07/10/2024 2359    Radiological Exams on Admission: I have personally reviewed images CT Angio Chest/Abd/Pel for Dissection W and/or Wo Contrast Result Date: 07/10/2024 CLINICAL DATA:  Acute aortic syndrome suspected. Shortness of breath. EXAM: CT ANGIOGRAPHY CHEST, ABDOMEN AND PELVIS TECHNIQUE: Non-contrast CT of the chest was initially obtained. Multidetector CT imaging through the chest, abdomen and pelvis was performed using the standard protocol during bolus administration of intravenous contrast. Multiplanar reconstructed images and MIPs were obtained and reviewed to evaluate the vascular anatomy. RADIATION DOSE REDUCTION: This exam was performed according to the departmental dose-optimization program which includes automated exposure control, adjustment of the mA and/or kV according to patient size and/or use of  iterative reconstruction technique. CONTRAST:  75mL OMNIPAQUE  IOHEXOL  350 MG/ML SOLN COMPARISON:  Chest radiograph 07/10/2024. CT abdomen and pelvis 05/26/2024. CT chest 03/04/2022. PET CT 12/13/2022 FINDINGS: CTA CHEST FINDINGS  Cardiovascular: Unenhanced images of the chest demonstrate diffuse calcification throughout the aorta and coronary arteries. No acute intramural hematoma. Images obtained during arterial phase after intravenous contrast material administration demonstrate patent common normal caliber thoracic aorta. No aortic dissection. Great vessel origins are patent. No filling defects in the central pulmonary arteries. No evidence of significant pulmonary embolus. Normal heart size although the left ventricle appears mildly dilated. No pericardial effusion. Mediastinum/Nodes: Esophagus is decompressed. No significant lymphadenopathy. Thyroid gland is unremarkable. Lungs/Pleura: Emphysematous changes in the lungs. Bilateral basilar atelectasis. Peribronchial thickening with mild bronchiectasis consistent with chronic bronchitis. No pleural effusion or pneumothorax. Musculoskeletal: Degenerative changes in the spine. Compression of T11 and T12 vertebra, unchanged. Sclerotic lesions demonstrated in several thoracic vertebrae and ribs corresponding to known metastatic disease. Review of the MIP images confirms the above findings. CTA ABDOMEN AND PELVIS FINDINGS VASCULAR Aorta: Normal caliber aorta without aneurysm, dissection, vasculitis or significant stenosis. Diffuse aortic calcification. Celiac: Patent without evidence of aneurysm, dissection, vasculitis or significant stenosis. SMA: Patent without evidence of aneurysm, dissection, vasculitis or significant stenosis. Renals: Both renal arteries are patent without evidence of aneurysm, dissection, vasculitis, fibromuscular dysplasia or significant stenosis. IMA: Patent without evidence of aneurysm, dissection, vasculitis or significant stenosis. Inflow:  Patent without evidence of aneurysm, dissection, vasculitis or significant stenosis. Veins: No obvious venous abnormality within the limitations of this arterial phase study. Review of the MIP images confirms the above findings. NON-VASCULAR Hepatobiliary: No focal liver abnormality is seen. No gallstones, gallbladder wall thickening, or biliary dilatation. Pancreas: Unremarkable. No pancreatic ductal dilatation or surrounding inflammatory changes. Spleen: Normal in size without focal abnormality. Adrenals/Urinary Tract: No adrenal gland nodules. Renal nephrograms are symmetrical but there is bilateral hydronephrosis and hydroureter down to the level of the bladder. No ureteral stones are identified. Changes likely indicate reflux disease or bladder outlet obstruction. Diffuse bladder wall thickening likely to represent radiation change given history of prostate cancer. Bladder outlet obstruction could also have this appearance. Coarse calcifications in the dependent portion of the bladder or possibly in the bladder wall or prostate. This is unchanged since prior study. Stomach/Bowel: Stomach, small bowel, and colon are not abnormally distended. Stool throughout the colon. No wall thickening or inflammatory changes. Appendix is not identified. Lymphatic: No significant lymphadenopathy. Reproductive: Prostate gland is not well visualized and may be surgically absent or atrophic due to radiation therapy. In the prostate bed, there is increased soft tissue which extends into the anterolateral left pelvic sidewall and the left proximal hip adductor muscles. This likely represents residual tumor or post treatment changes. Pelvic soft tissue infiltration is increasing since the previous studies. Loss of fat plane between the soft tissues and the bladder. Pelvic sidewall soft tissue today measures 3.2 cm thickness. Other: No free air or free fluid in the abdomen. There is a large heterogeneous collection in the inferior  left and central rectus abdominus muscles extending down to the symphysis pubis. This collection measures up to about 5.2 cm diameter. Stranding in the surrounding subcutaneous fat. This is likely an abscess. Musculoskeletal: Degenerative changes in the spine and hips. Scattered focal areas of bone sclerosis consistent with known metastasis. Cortical erosion at the symphysis pubis may represent post treatment changes or osteomyelitis. Review of the MIP images confirms the above findings. IMPRESSION: 1. Aortic atherosclerosis. No evidence of aneurysm or dissection involving the thoracic or abdominal aorta. 2. Overall normal heart size with left ventricular dilatation. 3. Atelectasis in the lung bases. 4. Increasing soft tissue mass/infiltration in the  low pelvis involving the prostate bed and extending to the left pelvic sidewall and into the proximal left adductor muscles. This likely represents residual recurrent tumor versus post treatment changes or possibly inflammatory process. 5. Diffuse bladder wall thickening may be due to bladder outlet obstruction or treatment changes. Stable calcification in the bladder or prostate region. Bilateral hydronephrosis and hydroureter likely related to reflux or bladder outlet obstruction. 6. Low anterior rectus abdominus collection with surrounding stranding likely representing abscess. 7. Cortical erosion at the symphysis pubis may represent osteomyelitis or possibly radiation changes. 8. Multiple sclerotic bone lesions consistent with known metastatic disease. Electronically Signed   By: Elsie Gravely M.D.   On: 07/10/2024 23:03   DG Chest Port 1 View Result Date: 07/10/2024 CLINICAL DATA:  Question of sepsis to evaluate for abnormality. Diffuse abdominal pain for 2 weeks radiating down the legs. EXAM: PORTABLE CHEST 1 VIEW COMPARISON:  06/06/2024 FINDINGS: Shallow inspiration. Heart size and pulmonary vascularity are normal for technique. Peribronchial thickening  with bronchiectasis in streaky perihilar opacities likely representing bronchitic changes. This could represent acute or chronic bronchitis. Scarring in the lung apices and bases. Probable linear atelectasis also in the lung bases. No pleural effusion or pneumothorax. Mediastinal contours appear intact. Calcification of the aorta. Poorly defined sclerotic lesions demonstrated in the scapula and ribs correlating with known metastatic disease. IMPRESSION: 1. Shallow inspiration. 2. Emphysematous changes, fibrosis, and bronchitic changes in the lungs. 3. Mild linear atelectasis in the lung bases. No focal consolidation. 4. Sclerotic bone lesions corresponding to known metastatic disease. Electronically Signed   By: Elsie Gravely M.D.   On: 07/10/2024 19:37     EKG: My personal interpretation of EKG shows:   EKG showing sinus tachycardia heart rate 105, right bundle branch block    Assessment/Plan: Principal Problem:   Abdominal wall abscess Active Problems:   Sepsis (HCC)   Essential hypertension   Prostate cancer metastatic to bone (HCC)   Paroxysmal atrial fibrillation (HCC)   History of CAD (coronary artery disease)   HFrEF (heart failure with reduced ejection fraction) (HCC)   Elevated troponin    Assessment and Plan: Abdominal wall muscle abscess Sepsis secondary to abdominal wall muscle abscess and acute cystitis -Present emergency department generalized abdominal pain radiating to bilateral lower extremities.  Patient reported low-grade fever at home.  At presentation to ED patient found tachycardic, tachypneic, borderline hypotensive.  Afebrile. -CBC showing leukocytosis 23.2.  Initial lactic acid 3.1 improved to 1 without any intervention. -Pending UA and urine culture.  Patient was recently admitted for sepsis in the setting of UTI and urine culture was negative so far. - Given patient was complaining of shortness of breath and in the setting of elevated troponin CTA chest has  been obtained which ruled out PE, aortic dissection and aneurysm however it showed extensive findings which are following: IMPRESSION: 1. Aortic atherosclerosis. No evidence of aneurysm or dissection involving the thoracic or abdominal aorta. 2. Overall normal heart size with left ventricular dilatation. 3. Atelectasis in the lung bases. 4. Increasing soft tissue mass/infiltration in the low pelvis involving the prostate bed and extending to the left pelvic sidewall and into the proximal left adductor muscles. This likely represents residual recurrent tumor versus post treatment changes or possibly inflammatory process. 5. Diffuse bladder wall thickening may be due to bladder outlet obstruction or treatment changes. Stable calcification in the bladder or prostate region. Bilateral hydronephrosis and hydroureter likely related to reflux or bladder outlet obstruction. 6. Low anterior rectus  abdominus collection with surrounding stranding likely representing abscess. 7. Cortical erosion at the symphysis pubis may represent osteomyelitis or possibly radiation changes. 8. Multiple sclerotic bone lesions consistent with known metastatic disease. -Given CT abdomen pelvis showing left pelvic sidewall, left proximal abductor muscle and low anterior rectal abdominis muscle abscess consulted general surgery Dr. Rubin will evaluate patient in the AM. - In the ED patient received IV vancomycin , cefepime  and metronidazole - Continue broad-spectrum antibiotic coverage with IV vancomycin  and Zosyn. -Need to follow-up with urine culture blood culture result. - In the setting of elevated BNP which is around 800 patient is complaining of shortness of breath deferring extensive IV fluid resuscitation.  Lactic acid already has been improved to 1.  Continue maintenance fluid LR at 50 cc/h. -Need to follow-up with culture results and general surgery recommendation. Update, UA showed evidence of UTI.  Patient  is already on Vanco and Zosyn for empiric coverage.  Acute on chronic CHF exacerbation HFrEF 30 to 35% Chronic hypotension-on midodrine  -Patient is complaining about shortness of breath.  Elevated BNP 857.  Chest x-ray no evidence of pulmonary vascular congestion however it revealed atelectasis in the lung bases. - Concern for acute CHF exacerbation however in the setting of sepsis holding any IV diuretics at this time. - Given patient has history of chronic hypotension GDMT limited for that.  Continue midodrine  5 g 3 times daily.  Elevated troponin-secondary to demand ischemia -Initial troponin 58 and second troponin 82.  Troponin around baseline.  Patient denies any chest pain/chest pressure. - EKG showing Sinus tachycardia heart rate 105, right bundle branch block. -At this time there is no concern for acute coronary syndrome - Elevated troponin in the context of sepsis and possible acute CAD exacerbation. - Obtaining limited echocardiogram.  Continue cardiac monitoring   Paroxysmal atrial fibrillation-not on anticoagulation Remote history of PACs monitor fibrillation 2020.  previously has been declined anticoagulation.  EKG showing normal sinus rhythm.  Continue Cardi monitoring  History of CAD -Continue aspirin  and pravastatin .  Given patient has history of hypotension limiting use of rate control agent.  History of metastatic prostate cancer Bladder stone Bilateral hydronephrosis and hydroureter Acute cystitis - Patient denies any fever and chill.  In the ED Foley catheter has been placed.  Pending UA and urine culture. -Continue broad-spectrum antibiotic coverage with Zosyn and vancomycin  - Please consult and inform urology and oncology in the daytime for further recommendation. Update, UA showing evidence of UTI.  Patient already on broad-spectrum antibiotic coverage with Vanco and Zosyn.   DVT prophylaxis:  Lovenox Code Statu.s:  Full Code per discussion with patient. Diet:  Heart healthy diet Family Communication:   Family was present at bedside, at the time of interview. Opportunity was given to ask question and all questions were answered satisfactorily.  Disposition Plan: Pending blood culture, UA urine culture results. Consults: General Surgery Admission status:   Inpatient, Step Down Unit  Severity of Illness: The appropriate patient status for this patient is INPATIENT. Inpatient status is judged to be reasonable and necessary in order to provide the required intensity of service to ensure the patient's safety. The patient's presenting symptoms, physical exam findings, and initial radiographic and laboratory data in the context of their chronic comorbidities is felt to place them at high risk for further clinical deterioration. Furthermore, it is not anticipated that the patient will be medically stable for discharge from the hospital within 2 midnights of admission.   * I certify that at the point  of admission it is my clinical judgment that the patient will require inpatient hospital care spanning beyond 2 midnights from the point of admission due to high intensity of service, high risk for further deterioration and high frequency of surveillance required.DEWAINE    Vansh Reckart, MD [facility]  How to contact the [facility] Attending or Consulting provider 7A - 7P or covering provider during after hours 7P -7A, for this patient.  Check the care team in [facility] and look for a) attending/consulting [facility] provider listed and b) the [facility] team listed Log into [email or web address] and use [place]'s universal password to access. If you do not have the password, please contact the hospital operator. Locate the [facility] provider you are looking for under [facility] and page to a number that you can be directly reached. If you still have difficulty reaching the provider, please page the [facility] (Director on Call) for the Hospitalists listed on amion for assistance.  07/11/2024,  1:38 AM

## 2024-07-11 NOTE — Plan of Care (Signed)
  Patient's hemoglobin dropped 9-7.2 today morning.  He is receiving 1 unit of blood transfusion today.  Concern for infected abdominal hematoma/abscess.  Receiving 1 unit of blood transfusion.  Plan to hold Lovenox for today.  If hemoglobin remains stable and there is no source of bleeding can reinitiate pharmacological DVT prophylaxis.  Of note patient has history of paroxysmal atrial fibrillation and previously declined anticoagulation.  Currently in sinus rhythm. Continue SCD and TED hose for DVT prophylaxis.  Lucyann Romano, MD [facility] 07/11/2024, 6:59 PM

## 2024-07-11 NOTE — Progress Notes (Signed)
 Pharmacy Antibiotic Note  Ronald Kaiser is a 89 y.o. male admitted on 07/10/2024 with sepsis d/t abdominal wall abscess.  Pharmacy has been consulted for vancomycin  and Zosyn dosing.  Plan: Vancomycin  1500mg  x1 then 1250mg  IV Q24H. Goal AUC 400-550.  Expected AUC 540. Zosyn 3.375g IV Q8H (4-hour infusion).  Height: 5' 11 (180.3 cm) Weight: 64 kg (141 lb) IBW/kg (Calculated) : 75.3  Temp (24hrs), Avg:99.2 F (37.3 C), Min:98.6 F (37 C), Max:99.7 F (37.6 C)  Recent Labs  Lab 07/10/24 1846 07/10/24 1940 07/10/24 2119 07/10/24 2128  WBC 23.2*  --  22.7*  --   CREATININE SPECIMEN HEMOLYZED. HEMOLYSIS MAY AFFECT INTEGRITY OF RESULTS.  --  1.04  --   LATICACIDVEN  --  3.1*  --  1.0    Estimated Creatinine Clearance: 45.3 mL/min (by C-G formula based on SCr of 1.04 mg/dL).    Allergies  Allergen Reactions   Bactrim [Sulfamethoxazole-Trimethoprim] Hives, Shortness Of Breath and Swelling   Augmentin [Amoxicillin-Pot Clavulanate] Diarrhea   Crestor  [Rosuvastatin  Calcium ] Other (See Comments)    Back pain Grogginess  Dizziness    Zetia  [Ezetimibe ] Other (See Comments)    Back pain Grogginess  Dizziness     Thank you for allowing pharmacy to be a part of this patient's care.  Marvetta Dauphin, PharmD, BCPS  07/11/2024 12:52 AM

## 2024-07-11 NOTE — Progress Notes (Signed)
 PROGRESS NOTE    Ronald Kaiser  FMW:[id number] DOB: 1934/03/05 DOA: 07/10/2024 PCP: [name], [name]    Brief Narrative:   89 year old with history of atrial fibrillation not on anticoagulation, HLD, HTN, CHF EF 35%, chronic hypotension on midodrine , CAD, metastatic prostate cancer status post radiation therapy comes to the ED with complaints of lower abdominal pain radiating down anterior leg.  Was admitted in September 2025 for suspected rectus sheath hematoma after a fall at home.  CTA chest is negative for PE and further workup reveals increase in soft tissue mass/infiltration of the lower pelvic involving prostate bed extending into left pelvic sidewall.  Also noted to have possible abscess surrounding the anterior rectus abdominis.  General surgery has been consulted.  Assessment & Plan:  Sepsis secondary to abdominal wall hematoma versus abscess -Had a fall last month causing rectus sheath hematoma and admitted to the hospital thereafter discharged home.  Suspect this area could be infected now with concerns of mild cellulitis.  Seen by general surgery.  Continue broad-spectrum antibiotics, will further tailor as necessary.  IR consulted for aspiration/drainage  Acute blood loss anemia - Baseline hemoglobin around 9, this morning 7.2.  Due to underlying cardiac history will go ahead and give 1 unit of blood    Acute on chronic CHF exacerbation HFrEF 30 to 35% Chronic hypotension-on midodrine  - Concern of possible mild exacerbation but in the setting of sepsis monitoring volume status and holding off on diuretics.  Currently patient is on room air.  Discontinue fluids. - On midodrine    Elevated troponin-secondary to demand ischemia - Likely from demand ischemia in the setting of sepsis.  Echocardiogram ordered   Paroxysmal atrial fibrillation-not on anticoagulation Previously declined also patient is at risk of falls.   History of CAD -Continue aspirin  and pravastatin .   Given patient has history of hypotension limiting use of rate control agent.   History of metastatic prostate cancer Bladder stone Bilateral hydronephrosis and hydroureter Acute cystitis - Appears patient has seen Dr. Nieves in the past from [facility] urology thereafter Dr. Patrcia from radiation oncology.  CT scan now shows worsening of his malignancy versus radiation changes.  I have discussed this with urology who will see the patient or arrange further follow-up. -I did review records within our system and Care Everywhere but there appears to be quite a bit of a gap as I do not have access to outpatient urology EMR.   DVT prophylaxis: enoxaparin (LOVENOX) injection 40 mg Start: 07/11/24 1000 SCDs Start: 07/11/24 0047 Place TED hose Start: 07/11/24 0047      Code Status: Full Code Family Communication:   Status is: Inpatient Remains inpatient appropriate because: Continue hospital stay for rectus sheath hematoma management concerning for abscess   PT Follow up Recs:   Subjective:  Feels better, some tenderness on the abdomen. Tells me he saw his urologist about a month ago  Examination:  General exam: Appears calm and comfortable  Respiratory system: Clear to auscultation. Respiratory effort normal. Cardiovascular system: S1 & S2 heard, RRR. No JVD, murmurs, rubs, gallops or clicks. No pedal edema. Gastrointestinal system: Slight abdominal wall tenderness.  Positive bowel sounds. Central nervous system: Alert and oriented. No focal neurological deficits. Extremities: Symmetric 5 x 5 power. Skin: No rashes, lesions or ulcers Psychiatry: Judgement and insight appear normal. Mood & affect appropriate.                Diet Orders (From admission, onward)     Start  Ordered   07/11/24 0821  Diet NPO time specified Except for: Sips with Meds  Diet effective now       Comments: Possible IR procedure  Question:  Except for  Answer:  Sips with Meds   07/11/24  0821            Objective: Vitals:   07/11/24 0530 07/11/24 0600 07/11/24 0700 07/11/24 0800  BP: (!) 96/51 (!) 101/51 (!) 96/51 (!) 102/55  Pulse:    86  Resp: 17   16  Temp:    (!) 96.7 F (35.9 C)  TempSrc:    Oral  SpO2:    99%  Weight:      Height:       No intake or output data in the 24 hours ending 07/11/24 1221 Filed Weights   07/10/24 1755  Weight: 64 kg    Scheduled Meds:  sodium chloride    Intravenous Once   aspirin  EC  81 mg Oral Daily   Chlorhexidine  Gluconate Cloth  6 each Topical Daily   enoxaparin (LOVENOX) injection  40 mg Subcutaneous Q24H   ferrous sulfate   325 mg Oral Q breakfast   midodrine   5 mg Oral TID WC   pravastatin   20 mg Oral QPM   sodium chloride  flush  3 mL Intravenous Q12H   sodium chloride  flush  3 mL Intravenous Q12H   Continuous Infusions:  sodium chloride      piperacillin-tazobactam (ZOSYN)  IV Stopped (07/11/24 1001)   [START ON 07/12/2024] vancomycin       Nutritional status     Body mass index is 19.67 kg/m.  Data Reviewed:   CBC: Recent Labs  Lab 07/10/24 1846 07/10/24 2119 07/11/24 0537  WBC 23.2* 22.7* 18.8*  NEUTROABS 20.2*  --   --   HGB 9.2* 8.0* 7.2*  HCT 29.0* 25.2* 22.8*  MCV 96.7 96.2 97.4  PLT PLATELET CLUMPS NOTED ON SMEAR, UNABLE TO ESTIMATE 360 309   Basic Metabolic Panel: Recent Labs  Lab 07/10/24 1846 07/10/24 2119 07/11/24 0537  NA SPECIMEN HEMOLYZED. HEMOLYSIS MAY AFFECT INTEGRITY OF RESULTS. 133* 135  K SPECIMEN HEMOLYZED. HEMOLYSIS MAY AFFECT INTEGRITY OF RESULTS. 4.2 3.7  CL SPECIMEN HEMOLYZED. HEMOLYSIS MAY AFFECT INTEGRITY OF RESULTS. 98 100  CO2 SPECIMEN HEMOLYZED. HEMOLYSIS MAY AFFECT INTEGRITY OF RESULTS. 22 23  GLUCOSE SPECIMEN HEMOLYZED. HEMOLYSIS MAY AFFECT INTEGRITY OF RESULTS. 128* 115*  BUN SPECIMEN HEMOLYZED. HEMOLYSIS MAY AFFECT INTEGRITY OF RESULTS. 41* 33*  CREATININE SPECIMEN HEMOLYZED. HEMOLYSIS MAY AFFECT INTEGRITY OF RESULTS. 1.04 0.90  CALCIUM  SPECIMEN  HEMOLYZED. HEMOLYSIS MAY AFFECT INTEGRITY OF RESULTS. 7.9* 7.9*   GFR: Estimated Creatinine Clearance: 52.3 mL/min (by C-G formula based on SCr of 0.9 mg/dL). Liver Function Tests: Recent Labs  Lab 07/10/24 1846 07/10/24 2119 07/11/24 0537  AST SPECIMEN HEMOLYZED. HEMOLYSIS MAY AFFECT INTEGRITY OF RESULTS. 22 15  ALT SPECIMEN HEMOLYZED. HEMOLYSIS MAY AFFECT INTEGRITY OF RESULTS. 17 15  ALKPHOS SPECIMEN HEMOLYZED. HEMOLYSIS MAY AFFECT INTEGRITY OF RESULTS. 61 55  BILITOT SPECIMEN HEMOLYZED. HEMOLYSIS MAY AFFECT INTEGRITY OF RESULTS. 0.4 0.4  PROT SPECIMEN HEMOLYZED. HEMOLYSIS MAY AFFECT INTEGRITY OF RESULTS. 6.2* 5.5*  ALBUMIN SPECIMEN HEMOLYZED. HEMOLYSIS MAY AFFECT INTEGRITY OF RESULTS. 1.8* 1.5*   No results for input(s): LIPASE, AMYLASE in the last 168 hours. No results for input(s): AMMONIA in the last 168 hours. Coagulation Profile: Recent Labs  Lab 07/10/24 1913  INR 1.2   Cardiac Enzymes: Recent Labs  Lab 07/10/24 2119  CKTOTAL 24*   BNP (last 3  results) No results for input(s): PROBNP in the last 8760 hours. HbA1C: No results for input(s): HGBA1C in the last 72 hours. CBG: No results for input(s): GLUCAP in the last 168 hours. Lipid Profile: No results for input(s): CHOL, HDL, LDLCALC, TRIG, CHOLHDL, LDLDIRECT in the last 72 hours. Thyroid Function Tests: No results for input(s): TSH, T4TOTAL, FREET4, T3FREE, THYROIDAB in the last 72 hours. Anemia Panel: No results for input(s): VITAMINB12, FOLATE, FERRITIN, TIBC, IRON , RETICCTPCT in the last 72 hours. Sepsis Labs: Recent Labs  Lab 07/10/24 1940 07/10/24 2128  LATICACIDVEN 3.1* 1.0    Recent Results (from the past 240 hours)  Blood Culture (routine x 2)     Status: None (Preliminary result)   Collection Time: 07/10/24  7:05 PM   Specimen: BLOOD  Result Value Ref Range Status   Specimen Description BLOOD RIGHT ANTECUBITAL  Final   Special Requests   Final     BOTTLES DRAWN AEROBIC AND ANAEROBIC Blood Culture adequate volume   Culture   Final    NO GROWTH < 24 HOURS Performed at [facility], [address] [place], [place] [address]    Report Status PENDING  Incomplete  Blood Culture (routine x 2)     Status: None (Preliminary result)   Collection Time: 07/10/24  7:10 PM   Specimen: BLOOD RIGHT FOREARM  Result Value Ref Range Status   Specimen Description BLOOD RIGHT FOREARM  Final   Special Requests   Final    BOTTLES DRAWN AEROBIC AND ANAEROBIC Blood Culture adequate volume   Culture   Final    NO GROWTH < 24 HOURS Performed at [facility], [address] [place], [place] [address]    Report Status PENDING  Incomplete         Radiology Studies: CT Angio Chest/Abd/Pel for Dissection W and/or Wo Contrast Result Date: 07/10/2024 CLINICAL DATA:  Acute aortic syndrome suspected. Shortness of breath. EXAM: CT ANGIOGRAPHY CHEST, ABDOMEN AND PELVIS TECHNIQUE: Non-contrast CT of the chest was initially obtained. Multidetector CT imaging through the chest, abdomen and pelvis was performed using the standard protocol during bolus administration of intravenous contrast. Multiplanar reconstructed images and MIPs were obtained and reviewed to evaluate the vascular anatomy. RADIATION DOSE REDUCTION: This exam was performed according to the departmental dose-optimization program which includes automated exposure control, adjustment of the mA and/or kV according to patient size and/or use of iterative reconstruction technique. CONTRAST:  75mL OMNIPAQUE  IOHEXOL  350 MG/ML SOLN COMPARISON:  Chest radiograph 07/10/2024. CT abdomen and pelvis 05/26/2024. CT chest 03/04/2022. PET CT 12/13/2022 FINDINGS: CTA CHEST FINDINGS Cardiovascular: Unenhanced images of the chest demonstrate diffuse calcification throughout the aorta and coronary arteries. No acute intramural hematoma. Images obtained during arterial phase after intravenous contrast material  administration demonstrate patent common normal caliber thoracic aorta. No aortic dissection. Great vessel origins are patent. No filling defects in the central pulmonary arteries. No evidence of significant pulmonary embolus. Normal heart size although the left ventricle appears mildly dilated. No pericardial effusion. Mediastinum/Nodes: Esophagus is decompressed. No significant lymphadenopathy. Thyroid gland is unremarkable. Lungs/Pleura: Emphysematous changes in the lungs. Bilateral basilar atelectasis. Peribronchial thickening with mild bronchiectasis consistent with chronic bronchitis. No pleural effusion or pneumothorax. Musculoskeletal: Degenerative changes in the spine. Compression of T11 and T12 vertebra, unchanged. Sclerotic lesions demonstrated in several thoracic vertebrae and ribs corresponding to known metastatic disease. Review of the MIP images confirms the above findings. CTA ABDOMEN AND PELVIS FINDINGS VASCULAR Aorta: Normal caliber aorta without aneurysm,  dissection, vasculitis or significant stenosis. Diffuse aortic calcification. Celiac: Patent without evidence of aneurysm, dissection, vasculitis or significant stenosis. SMA: Patent without evidence of aneurysm, dissection, vasculitis or significant stenosis. Renals: Both renal arteries are patent without evidence of aneurysm, dissection, vasculitis, fibromuscular dysplasia or significant stenosis. IMA: Patent without evidence of aneurysm, dissection, vasculitis or significant stenosis. Inflow: Patent without evidence of aneurysm, dissection, vasculitis or significant stenosis. Veins: No obvious venous abnormality within the limitations of this arterial phase study. Review of the MIP images confirms the above findings. NON-VASCULAR Hepatobiliary: No focal liver abnormality is seen. No gallstones, gallbladder wall thickening, or biliary dilatation. Pancreas: Unremarkable. No pancreatic ductal dilatation or surrounding inflammatory changes.  Spleen: Normal in size without focal abnormality. Adrenals/Urinary Tract: No adrenal gland nodules. Renal nephrograms are symmetrical but there is bilateral hydronephrosis and hydroureter down to the level of the bladder. No ureteral stones are identified. Changes likely indicate reflux disease or bladder outlet obstruction. Diffuse bladder wall thickening likely to represent radiation change given history of prostate cancer. Bladder outlet obstruction could also have this appearance. Coarse calcifications in the dependent portion of the bladder or possibly in the bladder wall or prostate. This is unchanged since prior study. Stomach/Bowel: Stomach, small bowel, and colon are not abnormally distended. Stool throughout the colon. No wall thickening or inflammatory changes. Appendix is not identified. Lymphatic: No significant lymphadenopathy. Reproductive: Prostate gland is not well visualized and may be surgically absent or atrophic due to radiation therapy. In the prostate bed, there is increased soft tissue which extends into the anterolateral left pelvic sidewall and the left proximal hip adductor muscles. This likely represents residual tumor or post treatment changes. Pelvic soft tissue infiltration is increasing since the previous studies. Loss of fat plane between the soft tissues and the bladder. Pelvic sidewall soft tissue today measures 3.2 cm thickness. Other: No free air or free fluid in the abdomen. There is a large heterogeneous collection in the inferior left and central rectus abdominus muscles extending down to the symphysis pubis. This collection measures up to about 5.2 cm diameter. Stranding in the surrounding subcutaneous fat. This is likely an abscess. Musculoskeletal: Degenerative changes in the spine and hips. Scattered focal areas of bone sclerosis consistent with known metastasis. Cortical erosion at the symphysis pubis may represent post treatment changes or osteomyelitis. Review of the  MIP images confirms the above findings. IMPRESSION: 1. Aortic atherosclerosis. No evidence of aneurysm or dissection involving the thoracic or abdominal aorta. 2. Overall normal heart size with left ventricular dilatation. 3. Atelectasis in the lung bases. 4. Increasing soft tissue mass/infiltration in the low pelvis involving the prostate bed and extending to the left pelvic sidewall and into the proximal left adductor muscles. This likely represents residual recurrent tumor versus post treatment changes or possibly inflammatory process. 5. Diffuse bladder wall thickening may be due to bladder outlet obstruction or treatment changes. Stable calcification in the bladder or prostate region. Bilateral hydronephrosis and hydroureter likely related to reflux or bladder outlet obstruction. 6. Low anterior rectus abdominus collection with surrounding stranding likely representing abscess. 7. Cortical erosion at the symphysis pubis may represent osteomyelitis or possibly radiation changes. 8. Multiple sclerotic bone lesions consistent with known metastatic disease. Electronically Signed   By: Elsie Gravely M.D.   On: 07/10/2024 23:03   DG Chest Port 1 View Result Date: 07/10/2024 CLINICAL DATA:  Question of sepsis to evaluate for abnormality. Diffuse abdominal pain for 2 weeks radiating down the legs. EXAM: PORTABLE CHEST 1  VIEW COMPARISON:  06/06/2024 FINDINGS: Shallow inspiration. Heart size and pulmonary vascularity are normal for technique. Peribronchial thickening with bronchiectasis in streaky perihilar opacities likely representing bronchitic changes. This could represent acute or chronic bronchitis. Scarring in the lung apices and bases. Probable linear atelectasis also in the lung bases. No pleural effusion or pneumothorax. Mediastinal contours appear intact. Calcification of the aorta. Poorly defined sclerotic lesions demonstrated in the scapula and ribs correlating with known metastatic disease.  IMPRESSION: 1. Shallow inspiration. 2. Emphysematous changes, fibrosis, and bronchitic changes in the lungs. 3. Mild linear atelectasis in the lung bases. No focal consolidation. 4. Sclerotic bone lesions corresponding to known metastatic disease. Electronically Signed   By: Elsie Gravely M.D.   On: 07/10/2024 19:37           LOS: 0 days   Time spent= 35 mins    [place] JAYSON Dare, MD [facility]  If 7PM-7AM, please contact night-coverage  07/11/2024, 12:21 PM

## 2024-07-11 NOTE — ED Notes (Signed)
 Pt was found to be sitting in soiled brief with urine and stool. Pt cleaned and new bedding placed on bed

## 2024-07-12 ENCOUNTER — Inpatient Hospital Stay (HOSPITAL_COMMUNITY)

## 2024-07-12 DIAGNOSIS — L02211 Cutaneous abscess of abdominal wall: Secondary | ICD-10-CM | POA: Diagnosis not present

## 2024-07-12 DIAGNOSIS — Z8679 Personal history of other diseases of the circulatory system: Secondary | ICD-10-CM | POA: Diagnosis not present

## 2024-07-12 DIAGNOSIS — C7951 Secondary malignant neoplasm of bone: Secondary | ICD-10-CM | POA: Diagnosis not present

## 2024-07-12 DIAGNOSIS — Z01818 Encounter for other preprocedural examination: Secondary | ICD-10-CM | POA: Diagnosis not present

## 2024-07-12 DIAGNOSIS — I5022 Chronic systolic (congestive) heart failure: Secondary | ICD-10-CM | POA: Diagnosis not present

## 2024-07-12 DIAGNOSIS — I480 Paroxysmal atrial fibrillation: Secondary | ICD-10-CM | POA: Diagnosis not present

## 2024-07-12 DIAGNOSIS — C61 Malignant neoplasm of prostate: Secondary | ICD-10-CM | POA: Diagnosis not present

## 2024-07-12 LAB — COMPREHENSIVE METABOLIC PANEL WITH GFR
ALT: 13 U/L (ref 0–44)
AST: 16 U/L (ref 15–41)
Albumin: <1.5 g/dL — ABNORMAL LOW (ref 3.5–5.0)
Alkaline Phosphatase: 60 U/L (ref 38–126)
Anion gap: 12 (ref 5–15)
BUN: 21 mg/dL (ref 8–23)
CO2: 23 mmol/L (ref 22–32)
Calcium: 7.7 mg/dL — ABNORMAL LOW (ref 8.9–10.3)
Chloride: 97 mmol/L — ABNORMAL LOW (ref 98–111)
Creatinine, Ser: 0.94 mg/dL (ref 0.61–1.24)
GFR, Estimated: >60 mL/min (ref >60)
Glucose, Bld: 155 mg/dL — ABNORMAL HIGH (ref 70–99)
Potassium: 3.6 mmol/L (ref 3.5–5.1)
Sodium: 132 mmol/L — ABNORMAL LOW (ref 135–145)
Total Bilirubin: 0.4 mg/dL (ref 0.0–1.2)
Total Protein: 5.8 g/dL — ABNORMAL LOW (ref 6.5–8.1)

## 2024-07-12 LAB — TYPE AND SCREEN
ABO/RH(D): A POS
Antibody Screen: NEGATIVE
Unit division: 0

## 2024-07-12 LAB — BPAM RBC
Blood Product Expiration Date: 202511012359
ISSUE DATE / TIME: 202510091556
Unit Type and Rh: 6200

## 2024-07-12 LAB — CBC
HCT: 28.6 % — ABNORMAL LOW (ref 39.0–52.0)
Hemoglobin: 9 g/dL — ABNORMAL LOW (ref 13.0–17.0)
MCH: 29.3 pg (ref 26.0–34.0)
MCHC: 31.5 g/dL (ref 30.0–36.0)
MCV: 93.2 fL (ref 80.0–100.0)
Platelets: 325 K/uL (ref 150–400)
RBC: 3.07 MIL/uL — ABNORMAL LOW (ref 4.22–5.81)
RDW: 19.5 % — ABNORMAL HIGH (ref 11.5–15.5)
WBC: 14.4 K/uL — ABNORMAL HIGH (ref 4.0–10.5)
nRBC: 0 % (ref 0.0–0.2)

## 2024-07-12 LAB — PSA: Prostatic Specific Antigen: 3.55 ng/mL (ref 0.00–4.00)

## 2024-07-12 MED ORDER — MIDAZOLAM HCL 2 MG/2ML IJ SOLN
INTRAMUSCULAR | Status: AC | PRN
  Administered 2024-07-12: 1 mg via INTRAVENOUS

## 2024-07-12 MED ORDER — LIDOCAINE HCL (PF) 1 % IJ SOLN
10.0000 mL | Freq: Once | INTRAMUSCULAR | Status: AC
  Administered 2024-07-12: 10 mL via INTRADERMAL

## 2024-07-12 MED ORDER — MORPHINE SULFATE (PF) 2 MG/ML IV SOLN
1.0000 mg | INTRAVENOUS | Status: DC | PRN
  Administered 2024-07-12: 1 mg via INTRAVENOUS
  Filled 2024-07-12: qty 1

## 2024-07-12 MED ORDER — FENTANYL CITRATE (PF) 100 MCG/2ML IJ SOLN
INTRAMUSCULAR | Status: AC | PRN
  Administered 2024-07-12: 25 ug via INTRAVENOUS

## 2024-07-12 MED ORDER — HYDROCODONE-ACETAMINOPHEN 5-325 MG PO TABS
1.0000 | ORAL_TABLET | ORAL | Status: DC | PRN
  Administered 2024-07-12: 1 via ORAL
  Filled 2024-07-12: qty 1

## 2024-07-12 MED ORDER — MIDAZOLAM HCL 2 MG/2ML IJ SOLN
INTRAMUSCULAR | Status: AC
  Filled 2024-07-12: qty 2

## 2024-07-12 MED ORDER — FENTANYL CITRATE (PF) 100 MCG/2ML IJ SOLN
INTRAMUSCULAR | Status: AC
  Filled 2024-07-12: qty 2

## 2024-07-12 MED ORDER — ENOXAPARIN SODIUM 40 MG/0.4ML IJ SOSY
40.0000 mg | PREFILLED_SYRINGE | INTRAMUSCULAR | Status: DC
Start: 2024-07-13 — End: 2024-07-23
  Administered 2024-07-13 – 2024-07-23 (×11): 40 mg via SUBCUTANEOUS
  Filled 2024-07-12 (×11): qty 0.4

## 2024-07-12 NOTE — Procedures (Signed)
  Procedure:  US  aspiration LLQ body wall collection 30ml cloudy fluid, for GS, C&S Preprocedure diagnosis: The primary encounter diagnosis was Sepsis, due to unspecified organism, unspecified whether acute organ dysfunction present (HCC). Diagnoses of Abscess of rectus sheath and Abnormal CT of the abdomen were also pertinent to this visit. Postprocedure diagnosis: same EBL:    minimal Complications:   none immediate  See full dictation in [employer].  CHARM Toribio Faes MD Main # [phone] Pager  [phone] Mobile [phone]

## 2024-07-12 NOTE — Plan of Care (Signed)

## 2024-07-12 NOTE — Progress Notes (Signed)
 [facility] Progress Note     Subjective: CC:  No acute events overnight, no complaints at present  Objective: Vital signs in last 24 hours: Temp:  [97.6 F (36.4 C)-98.3 F (36.8 C)] 98.3 F (36.8 C) (10/10 0740) Pulse Rate:  [32-108] 81 (10/10 0740) Resp:  [17-25] 20 (10/10 0740) BP: (93-110)/(48-59) 99/56 (10/10 0740) SpO2:  [92 %-100 %] 95 % (10/10 0740) Weight:  [67 kg] 67 kg (10/09 2013)    Intake/Output from previous day: 10/09 0701 - 10/10 0700 In: 780 [I.V.:150; Blood:630] Out: 1600 [Urine:1600] Intake/Output this shift: No intake/output data recorded.  PE: Gen:  Alert, NAD, pleasant Card:  Regular rate and rhythm Pulm:  Normal effort on room air Abd: Soft, tender to palpation of the lower abdomen where there is faint erythema just below the umbilicus, there is mild induration in this area. Foley catheter in place Skin: warm and dry, no rashes  Psych: A&Ox3   Lab Results:  Recent Labs    07/10/24 2119 07/11/24 0537  WBC 22.7* 18.8*  HGB 8.0* 7.2*  HCT 25.2* 22.8*  PLT 360 309   BMET Recent Labs    07/10/24 2119 07/11/24 0537  NA 133* 135  K 4.2 3.7  CL 98 100  CO2 22 23  GLUCOSE 128* 115*  BUN 41* 33*  CREATININE 1.04 0.90  CALCIUM  7.9* 7.9*   PT/INR Recent Labs    07/10/24 1913  LABPROT 15.9*  INR 1.2   CMP     Component Value Date/Time   NA 135 07/11/2024 0537   NA 137 12/13/2023 0944   K 3.7 07/11/2024 0537   CL 100 07/11/2024 0537   CO2 23 07/11/2024 0537   GLUCOSE 115 (H) 07/11/2024 0537   BUN 33 (H) 07/11/2024 0537   BUN 25 12/13/2023 0944   CREATININE 0.90 07/11/2024 0537   CREATININE 0.86 03/07/2016 1136   CALCIUM  7.9 (L) 07/11/2024 0537   PROT 5.5 (L) 07/11/2024 0537   PROT 6.5 09/11/2023 1221   ALBUMIN 1.5 (L) 07/11/2024 0537   ALBUMIN 4.2 09/11/2023 1221   AST 15 07/11/2024 0537   ALT 15 07/11/2024 0537   ALKPHOS 55 07/11/2024 0537   BILITOT 0.4 07/11/2024 0537   BILITOT 0.5 09/11/2023 1221    GFRNONAA >60 07/11/2024 0537   GFRNONAA 82 12/25/2014 0810   GFRAA  07/10/2024 1846    SPECIMEN HEMOLYZED. HEMOLYSIS MAY AFFECT INTEGRITY OF RESULTS.   GFRAA >89 12/25/2014 0810   Lipase     Component Value Date/Time   LIPASE 27 05/26/2024 1218       Studies/Results: ECHOCARDIOGRAM LIMITED Result Date: 07/11/2024    ECHOCARDIOGRAM LIMITED REPORT   Patient Name:   Ronald Kaiser Date of Exam: 07/11/2024 Medical Rec #:  [id number]      Height:       71.0 in Accession #:    [id number]     Weight:       141.0 lb Date of Birth:  12/03/1933     BSA:          1.817 m Patient Age:    89 years       BP:           96/51 mmHg Patient Gender: M              HR:           81 bpm. Exam Location:  Inpatient Procedure: 2D Echo, Limited Color Doppler and Intracardiac Opacification Agent Indications:  Congestive heart failure  History:        Patient has prior history of Echocardiogram examinations. CHF,                 CAD; Risk Factors:Hypertension.  Sonographer:    Charmaine Gaskins Referring Phys: [id number] SUBRINA SUNDIL IMPRESSIONS  1. Left ventricular ejection fraction, by estimation, is 25 to 30%. The left ventricle has severely decreased function. The left ventricle demonstrates regional wall motion abnormalities with mid to apical anteroseptal and inferoseptal akinesis, apical anterior akinesis, apical lateral akinesis, apical inferior akinesis and akinesis of the true apex. LAD territory wall motion abnormalities. No LV thrombus visualized.  2. The mitral valve is normal in structure. Mild mitral valve regurgitation. No evidence of mitral stenosis.  3. The aortic valve is tricuspid. There is moderate calcification of the aortic valve. Aortic valve regurgitation is mild. No aortic stenosis is present.  4. Aortic dilatation noted. There is mild dilatation of the aortic root, measuring 40 mm.  5. Limited echo for LV function. FINDINGS  Left Ventricle: Left ventricular ejection fraction, by estimation, is 25 to  30%. The left ventricle has severely decreased function. The left ventricle demonstrates regional wall motion abnormalities. Definity  contrast agent was given IV to delineate the left ventricular endocardial borders. Left Atrium: Left atrial size was normal in size. Right Atrium: Right atrial size was normal in size. Mitral Valve: The mitral valve is normal in structure. Mild mitral valve regurgitation. No evidence of mitral valve stenosis. Tricuspid Valve: Tricuspid valve regurgitation is mild. Aortic Valve: The aortic valve is tricuspid. There is moderate calcification of the aortic valve. Aortic valve regurgitation is mild. No aortic stenosis is present. Aorta: The aortic root is normal in size and structure and aortic dilatation noted. There is mild dilatation of the aortic root, measuring 40 mm.  LV Volumes (MOD) LV vol d, MOD A2C: 142.0 ml LV vol d, MOD A4C: 182.0 ml LV vol s, MOD A2C: 108.0 ml LV vol s, MOD A4C: 112.0 ml LV SV MOD A2C:     34.0 ml LV SV MOD A4C:     182.0 ml LV SV MOD BP:      57.0 ml RIGHT VENTRICLE RV S prime:     21.10 cm/s  AORTA Ao Root diam: 4.00 cm Ao Asc diam:  3.50 cm Dalton McleanMD Electronically signed by Ezra Kanner Signature Date/Time: 07/11/2024/1:35:24 PM    Final    CT Angio Chest/Abd/Pel for Dissection W and/or Wo Contrast Result Date: 07/10/2024 CLINICAL DATA:  Acute aortic syndrome suspected. Shortness of breath. EXAM: CT ANGIOGRAPHY CHEST, ABDOMEN AND PELVIS TECHNIQUE: Non-contrast CT of the chest was initially obtained. Multidetector CT imaging through the chest, abdomen and pelvis was performed using the standard protocol during bolus administration of intravenous contrast. Multiplanar reconstructed images and MIPs were obtained and reviewed to evaluate the vascular anatomy. RADIATION DOSE REDUCTION: This exam was performed according to the departmental dose-optimization program which includes automated exposure control, adjustment of the mA and/or kV according to  patient size and/or use of iterative reconstruction technique. CONTRAST:  75mL OMNIPAQUE  IOHEXOL  350 MG/ML SOLN COMPARISON:  Chest radiograph 07/10/2024. CT abdomen and pelvis 05/26/2024. CT chest 03/04/2022. PET CT 12/13/2022 FINDINGS: CTA CHEST FINDINGS Cardiovascular: Unenhanced images of the chest demonstrate diffuse calcification throughout the aorta and coronary arteries. No acute intramural hematoma. Images obtained during arterial phase after intravenous contrast material administration demonstrate patent common normal caliber thoracic aorta. No aortic dissection. Great vessel origins are patent. No  filling defects in the central pulmonary arteries. No evidence of significant pulmonary embolus. Normal heart size although the left ventricle appears mildly dilated. No pericardial effusion. Mediastinum/Nodes: Esophagus is decompressed. No significant lymphadenopathy. Thyroid gland is unremarkable. Lungs/Pleura: Emphysematous changes in the lungs. Bilateral basilar atelectasis. Peribronchial thickening with mild bronchiectasis consistent with chronic bronchitis. No pleural effusion or pneumothorax. Musculoskeletal: Degenerative changes in the spine. Compression of T11 and T12 vertebra, unchanged. Sclerotic lesions demonstrated in several thoracic vertebrae and ribs corresponding to known metastatic disease. Review of the MIP images confirms the above findings. CTA ABDOMEN AND PELVIS FINDINGS VASCULAR Aorta: Normal caliber aorta without aneurysm, dissection, vasculitis or significant stenosis. Diffuse aortic calcification. Celiac: Patent without evidence of aneurysm, dissection, vasculitis or significant stenosis. SMA: Patent without evidence of aneurysm, dissection, vasculitis or significant stenosis. Renals: Both renal arteries are patent without evidence of aneurysm, dissection, vasculitis, fibromuscular dysplasia or significant stenosis. IMA: Patent without evidence of aneurysm, dissection, vasculitis or  significant stenosis. Inflow: Patent without evidence of aneurysm, dissection, vasculitis or significant stenosis. Veins: No obvious venous abnormality within the limitations of this arterial phase study. Review of the MIP images confirms the above findings. NON-VASCULAR Hepatobiliary: No focal liver abnormality is seen. No gallstones, gallbladder wall thickening, or biliary dilatation. Pancreas: Unremarkable. No pancreatic ductal dilatation or surrounding inflammatory changes. Spleen: Normal in size without focal abnormality. Adrenals/Urinary Tract: No adrenal gland nodules. Renal nephrograms are symmetrical but there is bilateral hydronephrosis and hydroureter down to the level of the bladder. No ureteral stones are identified. Changes likely indicate reflux disease or bladder outlet obstruction. Diffuse bladder wall thickening likely to represent radiation change given history of prostate cancer. Bladder outlet obstruction could also have this appearance. Coarse calcifications in the dependent portion of the bladder or possibly in the bladder wall or prostate. This is unchanged since prior study. Stomach/Bowel: Stomach, small bowel, and colon are not abnormally distended. Stool throughout the colon. No wall thickening or inflammatory changes. Appendix is not identified. Lymphatic: No significant lymphadenopathy. Reproductive: Prostate gland is not well visualized and may be surgically absent or atrophic due to radiation therapy. In the prostate bed, there is increased soft tissue which extends into the anterolateral left pelvic sidewall and the left proximal hip adductor muscles. This likely represents residual tumor or post treatment changes. Pelvic soft tissue infiltration is increasing since the previous studies. Loss of fat plane between the soft tissues and the bladder. Pelvic sidewall soft tissue today measures 3.2 cm thickness. Other: No free air or free fluid in the abdomen. There is a large heterogeneous  collection in the inferior left and central rectus abdominus muscles extending down to the symphysis pubis. This collection measures up to about 5.2 cm diameter. Stranding in the surrounding subcutaneous fat. This is likely an abscess. Musculoskeletal: Degenerative changes in the spine and hips. Scattered focal areas of bone sclerosis consistent with known metastasis. Cortical erosion at the symphysis pubis may represent post treatment changes or osteomyelitis. Review of the MIP images confirms the above findings. IMPRESSION: 1. Aortic atherosclerosis. No evidence of aneurysm or dissection involving the thoracic or abdominal aorta. 2. Overall normal heart size with left ventricular dilatation. 3. Atelectasis in the lung bases. 4. Increasing soft tissue mass/infiltration in the low pelvis involving the prostate bed and extending to the left pelvic sidewall and into the proximal left adductor muscles. This likely represents residual recurrent tumor versus post treatment changes or possibly inflammatory process. 5. Diffuse bladder wall thickening may be due to bladder outlet  obstruction or treatment changes. Stable calcification in the bladder or prostate region. Bilateral hydronephrosis and hydroureter likely related to reflux or bladder outlet obstruction. 6. Low anterior rectus abdominus collection with surrounding stranding likely representing abscess. 7. Cortical erosion at the symphysis pubis may represent osteomyelitis or possibly radiation changes. 8. Multiple sclerotic bone lesions consistent with known metastatic disease. Electronically Signed   By: Elsie Gravely M.D.   On: 07/10/2024 23:03   DG Chest Port 1 View Result Date: 07/10/2024 CLINICAL DATA:  Question of sepsis to evaluate for abnormality. Diffuse abdominal pain for 2 weeks radiating down the legs. EXAM: PORTABLE CHEST 1 VIEW COMPARISON:  06/06/2024 FINDINGS: Shallow inspiration. Heart size and pulmonary vascularity are normal for technique.  Peribronchial thickening with bronchiectasis in streaky perihilar opacities likely representing bronchitic changes. This could represent acute or chronic bronchitis. Scarring in the lung apices and bases. Probable linear atelectasis also in the lung bases. No pleural effusion or pneumothorax. Mediastinal contours appear intact. Calcification of the aorta. Poorly defined sclerotic lesions demonstrated in the scapula and ribs correlating with known metastatic disease. IMPRESSION: 1. Shallow inspiration. 2. Emphysematous changes, fibrosis, and bronchitic changes in the lungs. 3. Mild linear atelectasis in the lung bases. No focal consolidation. 4. Sclerotic bone lesions corresponding to known metastatic disease. Electronically Signed   By: Elsie Gravely M.D.   On: 07/10/2024 19:37    Anti-infectives: Anti-infectives (From admission, onward)    Start     Dose/Rate Route Frequency Ordered Stop   07/12/24 0000  vancomycin  (VANCOREADY) IVPB 1250 mg/250 mL        1,250 mg 166.7 mL/hr over 90 Minutes Intravenous Every 24 hours 07/11/24 0057     07/11/24 0600  piperacillin-tazobactam (ZOSYN) IVPB 3.375 g        3.375 g 12.5 mL/hr over 240 Minutes Intravenous Every 8 hours 07/11/24 0057     07/11/24 0000  metroNIDAZOLE (FLAGYL) IVPB 500 mg        500 mg 100 mL/hr over 60 Minutes Intravenous  Once 07/10/24 2348 07/11/24 0626   07/11/24 0000  vancomycin  (VANCOREADY) IVPB 1500 mg/300 mL        1,500 mg 150 mL/hr over 120 Minutes Intravenous  Once 07/10/24 2353 07/11/24 0512   07/10/24 2315  ceFEPIme  (MAXIPIME ) 2 g in sodium chloride  0.9 % 100 mL IVPB        2 g 200 mL/hr over 30 Minutes Intravenous  Once 07/10/24 2304 07/11/24 0219        Assessment/Plan Infected abdominal wall hematoma versus abscess -Appreciate interventional radiology attempting drainage -If interventional radiology is unsuccessful with drainage he may need surgical drainage.  Cardiac history including decreased ejection  fraction noted, will have to consider the risks of anesthesia if he does require surgical drainage -[facility] will follow closely  FEN: N.p.o. for procedure IVF per primary VTE: Lovenox ID: [email or web address]  Metastatic prostate cancer with signs of progressive disease in the pelvis Acute on chronic CHF Chronic hypotension on midodrine  A-fib not on anticoagulation CAD   LOS: 1 day   I reviewed nursing notes, hospitalist notes, last 24 h vitals and pain scores, last 48 h intake and output, last 24 h labs and trends, and last 24 h imaging results.  This care required moderate level of medical decision making.   Almarie Pringle, [name] [facility] Please see Amion for pager number during day hours 7:00am-4:30pm

## 2024-07-12 NOTE — Consult Note (Addendum)
 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 Royal  HISTORY OF PRESENTING ILLNESS:  Ronald Kaiser 89 y.o. male who was admitted on 07/11/2024 with complaints of shortness of breath and abdominal pain. Patient with 22 year history of  prostate cancer, now with likely widespread mets.  Therefore oncology evaluation requested. Patient seen and examined today. Patient is awake, alert and oriented x4.  He is a very pleasant weak-appearing gentleman with O2 via [place] in use.  He served as principal historian. Admits to current lower abdominal pain however does not want to take morphine  because it will stay in my body a long time.  Denies other acute GI symptoms.  Details that he was doing some chores for his wife when he fell backwards on the counter and had to be assisted by his wife and a Network engineer.  Reports this caused his fractures in his back and ribs. Medical history as states is significant for prostate cancer diagnosed in 2003.  Patient states he received radiation therapy before however no chemotherapy. He has never seen Medical Oncology.  Also history of multiple MI's, AKI, CAD, CHF, hypertension, and hyperlipidemia.  Surgical history includes transurethral resection of prostate and cardiac cath.  Family oncologic history is noncontributory.  Social history includes former tobacco use which he started very young. Admits to social alcohol use beer and wine occasionally. Denies illicit or recreational drug use. Denies occupational hazardous materials exposure; worked for a Development worker, community in the accounting, Chiropodist, mostly teaching.      I have reviewed  his chart and materials related to his cancer extensively and collaborated history with the patient. Summary of oncologic history is as follows: Oncology History   No history exists.    ASSESSMENT & PLAN:   Prostate Adenocarcinoma with bone mets History of severe radiation proctitis -- Imaging 07/10/24 shows increasing soft tissue mass in low pelvis, may be recurrent tumor versus post treatment changes or inflammation.  Also shows sclerotic bone lesions.  -- PSA done 07/12/24 is 3.55 WNL -- On Xtandi ; not on other systemic chemotherapy.  -- status post radiation therapy -- Status post TURP x2 in 2020 and 2021.  -- Has been following with [facility]/Dr. Patrcia -- Medical Oncology/Dr. Timmy will make further evaluation and treatment recommendations.  Abdominal pain Shortness of breath -- Patient admitted with complaints of lower abdominal pain and shortness of breath. -- Continue pain meds as ordered.  -- Continue supportive care  Anemia, normocytic History of iron  deficiency anemia -- Likely multifactorial due to chronic disease and malignancy -- Hemoglobin low 7.2 on 10/9 -- On oral ferrous sulfate  325 mg daily -- Recommend PRBC transfusion for hemoglobin < or equal to 7.0. -- Monitor CBC with differential closely.  Leukocytosis Sepsis  Infected abdominal wall hematoma vs abscess -- Elevated WBCs -- IR with drainage planned. -- Continue IV Zosyn and vanco as ordered -- Afebrile, monitor fever curve -- Surg team following  History of CAD History of CHF History of multiple MI's -- Medical teams following  History of Falls -- with multiple fractures documented    MEDICAL HISTORY:  Past Medical History:  Diagnosis Date   AKI (acute kidney injury) 11/12/2018  CAD (coronary artery disease)    CHF (congestive heart failure) (HCC)    Dyspnea    Foley catheter in place    HTN (hypertension)    Hyperlipidemia    Myocardial infarction (HCC) 2000    REPORTS HE HAS HAD 5 HEART ATTACKS BUT SOME OF THE WERE NOT SERIOUS    Obesity    Prostate cancer (HCC)     SURGICAL HISTORY: Past Surgical History:  Procedure Laterality Date   CARDIAC CATHETERIZATION N/A 03/09/2016   Procedure: Left Heart Cath and Coronary Angiography;  Surgeon: Victory LELON Sharps, MD;  Location: [facility] INVASIVE CV LAB;  Service: Cardiovascular;  Laterality: N/A;   COLONOSCOPY N/A 05/28/2024   Procedure: COLONOSCOPY;  Surgeon: Leigh Elspeth SQUIBB, MD;  Location: [facility] ENDOSCOPY;  Service: Gastroenterology;  Laterality: N/A;   CYSTOSCOPY W/ RETROGRADES Bilateral 08/19/2020   Procedure: BILATERAL RETROGRADE PYELOGRAM;  Surgeon: Cam Morene LELON, MD;  Location: [facility] ORS;  Service: Urology;  Laterality: Bilateral;   ESOPHAGOGASTRODUODENOSCOPY N/A 05/28/2024   Procedure: EGD (ESOPHAGOGASTRODUODENOSCOPY);  Surgeon: Leigh Elspeth SQUIBB, MD;  Location: [facility] ENDOSCOPY;  Service: Gastroenterology;  Laterality: N/A;   RADIATION TO PROSTATE     Stenting of LAD  2000   TONSILLECTOMY     TRANSURETHRAL RESECTION OF PROSTATE N/A 03/01/2019   Procedure: TRANSURETHRAL RESECTION OF THE PROSTATE (TURP);  Surgeon: Cam Morene LELON, MD;  Location: [facility] ORS;  Service: Urology;  Laterality: N/A;  75 MINS   TRANSURETHRAL RESECTION OF PROSTATE N/A 08/19/2020   Procedure: TRANSURETHRAL RESECTION OF THE PROSTATE (TURP);  Surgeon: Cam Morene LELON, MD;  Location: [facility] ORS;  Service: Urology;  Laterality: N/A;    SOCIAL HISTORY: Social History   Socioeconomic History   Marital status: Married    Spouse name: Not on file   Number of children: Not on file   Years of education: Not on file   Highest education level: Not on file  Occupational History    Comment: retired  Tobacco Use   Smoking status: Former    Current packs/day: 0.00    Average packs/day: 0.5 packs/day for 7.0 years (3.5 ttl pk-yrs)    Types: Cigarettes    Start date: 10/04/1955    Quit date: 10/03/1962    Years since quitting: 61.8    Smokeless tobacco: Never  Vaping Use   Vaping status: Never Used  Substance and Sexual Activity   Alcohol use: Yes    Comment: [employer] [employer]    Drug use: Not Currently   Sexual activity: Not Currently  Other Topics Concern   Not on file  Social History Narrative   Moved from [facility] to [place], [place] following divorce to be closer to his brother. Patient has remarried.   Social Drivers of Corporate investment banker Strain: Not on file  Food Insecurity: Patient Declined (07/12/2024)   Hunger Vital Sign    Worried About Running Out of Food in the Last Year: Patient declined    Ran Out of Food in the Last Year: Patient declined  Transportation Needs: Patient Declined (07/12/2024)   PRAPARE - Administrator, Civil Service (Medical): Patient declined    Lack of Transportation (Non-Medical): Patient declined  Physical Activity: Not on file  Stress: Not on file  Social Connections: Patient Declined (07/12/2024)   Social Connection and Isolation Panel    Frequency of Communication with Friends and Family: Patient declined    Frequency of Social Gatherings with Friends and Family: Patient declined    Attends  Religious Services: Patient declined    Active Member of Clubs or Organizations: Patient declined    Attends Banker Meetings: Patient declined    Marital Status: Patient declined  Recent Concern: Social Connections - Moderately Isolated (06/07/2024)   Social Connection and Isolation Panel    Frequency of Communication with Friends and Family: More than three times a week    Frequency of Social Gatherings with Friends and Family: More than three times a week    Attends Religious Services: Never    Database administrator or Organizations: No    Attends Banker Meetings: Never    Marital Status: Married  Catering manager Violence: Patient Declined (07/12/2024)   Humiliation, Afraid, Rape, and Kick questionnaire    Fear of Current or  Ex-Partner: Patient declined    Emotionally Abused: Patient declined    Physically Abused: Patient declined    Sexually Abused: Patient declined    FAMILY HISTORY: Family History  Problem Relation Age of Onset   Hypertension Father    Breast cancer Neg Hx    Prostate cancer Neg Hx    Colon cancer Neg Hx    Pancreatic cancer Neg Hx      PHYSICAL EXAMINATION: ECOG PERFORMANCE STATUS: 3 - Symptomatic, >50% confined to bed  Vitals:   07/12/24 0000 07/12/24 0740  BP: (!) 102/59 (!) 99/56  Pulse: 78 81  Resp: 20 20  Temp: 97.9 F (36.6 C) 98.3 F (36.8 C)  SpO2: 98% 95%   Filed Weights   07/10/24 1755 07/11/24 2013  Weight: 141 lb (64 kg) 147 lb 11.3 oz (67 kg)    GENERAL: alert, no distress and comfortable +generalized weakness SKIN: +pale skin color, texture, turgor are normal, no rashes or significant lesions EYES: normal, conjunctiva are pink and non-injected, sclera clear OROPHARYNX: no exudate, no erythema and lips, buccal mucosa, and tongue normal  NECK: supple, thyroid normal size, non-tender, without nodularity LYMPH: no palpable lymphadenopathy in the cervical, axillary or inguinal LUNGS: clear to auscultation and percussion with normal breathing effort HEART: regular rate & rhythm and no murmurs and no lower extremity edema ABDOMEN: +abdominal tenderness with slight distention MUSCULOSKELETAL: no cyanosis of digits and no clubbing  PSYCH: alert & oriented x 3 with fluent speech NEURO: no focal motor/sensory deficits   ALLERGIES:  is allergic to bactrim [sulfamethoxazole-trimethoprim], augmentin [amoxicillin-pot clavulanate], crestor  [rosuvastatin  calcium ], and zetia  [ezetimibe ].  MEDICATIONS:  Current Facility-Administered Medications  Medication Dose Route Frequency Provider Last Rate Last Admin   acetaminophen  (TYLENOL ) tablet 650 mg  650 mg Oral Q6H PRN Sundil, Subrina, MD       Or   acetaminophen  (TYLENOL ) suppository 650 mg  650 mg Rectal Q6H PRN  Sundil, Subrina, MD       Chlorhexidine  Gluconate Cloth 2 % PADS 6 each  6 each Topical Daily Sundil, Subrina, MD   6 each at 07/12/24 9167   ferrous sulfate  tablet 325 mg  325 mg Oral Q breakfast Sundil, Subrina, MD   325 mg at 07/12/24 0830   midodrine  (PROAMATINE ) tablet 5 mg  5 mg Oral TID WC Sundil, Subrina, MD   5 mg at 07/12/24 1151   morphine  (PF) 2 MG/ML injection 1 mg  1 mg Intravenous Q4H PRN Samtani, Jai-Gurmukh, MD       ondansetron  (ZOFRAN ) tablet 4 mg  4 mg Oral Q6H PRN Sundil, Subrina, MD       Or   ondansetron  (ZOFRAN ) injection 4 mg  4 mg  Intravenous Q6H PRN Sundil, Subrina, MD       piperacillin-tazobactam (ZOSYN) IVPB 3.375 g  3.375 g Intravenous Q8H Mattie Marvetta SQUIBB, RPH 12.5 mL/hr at 07/12/24 0606 3.375 g at 07/12/24 0606   pravastatin  (PRAVACHOL ) tablet 20 mg  20 mg Oral QPM Sundil, Subrina, MD   20 mg at 07/11/24 1850   sodium chloride  flush (NS) 0.9 % injection 3 mL  3 mL Intravenous Q12H Sundil, Subrina, MD   3 mL at 07/12/24 9167   sodium chloride  flush (NS) 0.9 % injection 3 mL  3 mL Intravenous Q12H Sundil, Subrina, MD   3 mL at 07/12/24 9167   sodium chloride  flush (NS) 0.9 % injection 3 mL  3 mL Intravenous PRN Sundil, Subrina, MD       vancomycin  (VANCOREADY) IVPB 1250 mg/250 mL  1,250 mg Intravenous Q24H Mattie Marvetta SQUIBB, RPH 166.7 mL/hr at 07/12/24 1047 1,250 mg at 07/12/24 1047     LABORATORY DATA:  I have reviewed the data as listed Lab Results  Component Value Date   WBC 18.8 (H) 07/11/2024   HGB 7.2 (L) 07/11/2024   HCT 22.8 (L) 07/11/2024   MCV 97.4 07/11/2024   PLT 309 07/11/2024   Recent Labs    09/11/23 1221 09/19/23 1029 07/10/24 1846 07/10/24 2119 07/11/24 0537  NA 141   < > SPECIMEN HEMOLYZED. HEMOLYSIS MAY AFFECT INTEGRITY OF RESULTS. 133* 135  K 4.7   < > SPECIMEN HEMOLYZED. HEMOLYSIS MAY AFFECT INTEGRITY OF RESULTS. 4.2 3.7  CL 104   < > SPECIMEN HEMOLYZED. HEMOLYSIS MAY AFFECT INTEGRITY OF RESULTS. 98 100  CO2 24   < > SPECIMEN  HEMOLYZED. HEMOLYSIS MAY AFFECT INTEGRITY OF RESULTS. 22 23  GLUCOSE 99   < > SPECIMEN HEMOLYZED. HEMOLYSIS MAY AFFECT INTEGRITY OF RESULTS. 128* 115*  BUN 17   < > SPECIMEN HEMOLYZED. HEMOLYSIS MAY AFFECT INTEGRITY OF RESULTS. 41* 33*  CREATININE 0.86   < > SPECIMEN HEMOLYZED. HEMOLYSIS MAY AFFECT INTEGRITY OF RESULTS. 1.04 0.90  CALCIUM  9.2   < > SPECIMEN HEMOLYZED. HEMOLYSIS MAY AFFECT INTEGRITY OF RESULTS. 7.9* 7.9*  GFRNONAA  --    < > SPECIMEN HEMOLYZED. HEMOLYSIS MAY AFFECT INTEGRITY OF RESULTS. >60 >60  GFRAA  --   --  SPECIMEN HEMOLYZED. HEMOLYSIS MAY AFFECT INTEGRITY OF RESULTS.  --   --   PROT 6.5   < > SPECIMEN HEMOLYZED. HEMOLYSIS MAY AFFECT INTEGRITY OF RESULTS. 6.2* 5.5*  ALBUMIN 4.2   < > SPECIMEN HEMOLYZED. HEMOLYSIS MAY AFFECT INTEGRITY OF RESULTS. 1.8* 1.5*  AST 22   < > SPECIMEN HEMOLYZED. HEMOLYSIS MAY AFFECT INTEGRITY OF RESULTS. 22 15  ALT 13   < > SPECIMEN HEMOLYZED. HEMOLYSIS MAY AFFECT INTEGRITY OF RESULTS. 17 15  ALKPHOS 76   < > SPECIMEN HEMOLYZED. HEMOLYSIS MAY AFFECT INTEGRITY OF RESULTS. 61 55  BILITOT 0.5   < > SPECIMEN HEMOLYZED. HEMOLYSIS MAY AFFECT INTEGRITY OF RESULTS. 0.4 0.4  BILIDIR 0.19  --   --   --   --    < > = values in this interval not displayed.    RADIOGRAPHIC STUDIES: I have personally reviewed the radiological images as listed and agreed with the findings in the report. ECHOCARDIOGRAM LIMITED Result Date: 07/11/2024    ECHOCARDIOGRAM LIMITED REPORT   Patient Name:   LADEN FIELDHOUSE Date of Exam: 07/11/2024 Medical Rec #:  [id number]      Height:       71.0 in Accession #:  [id number]     Weight:       141.0 lb Date of Birth:  [birth year]     BSA:          1.817 m Patient Age:    75 years       BP:           96/51 mmHg Patient Gender: M              HR:           81 bpm. Exam Location:  Inpatient Procedure: 2D Echo, Limited Color Doppler and Intracardiac Opacification Agent Indications:    Congestive heart failure  History:        Patient has prior  history of Echocardiogram examinations. CHF,                 CAD; Risk Factors:Hypertension.  Sonographer:    Charmaine Gaskins Referring Phys: [id number] SUBRINA SUNDIL IMPRESSIONS  1. Left ventricular ejection fraction, by estimation, is 25 to 30%. The left ventricle has severely decreased function. The left ventricle demonstrates regional wall motion abnormalities with mid to apical anteroseptal and inferoseptal akinesis, apical anterior akinesis, apical lateral akinesis, apical inferior akinesis and akinesis of the true apex. LAD territory wall motion abnormalities. No LV thrombus visualized.  2. The mitral valve is normal in structure. Mild mitral valve regurgitation. No evidence of mitral stenosis.  3. The aortic valve is tricuspid. There is moderate calcification of the aortic valve. Aortic valve regurgitation is mild. No aortic stenosis is present.  4. Aortic dilatation noted. There is mild dilatation of the aortic root, measuring 40 mm.  5. Limited echo for LV function. FINDINGS  Left Ventricle: Left ventricular ejection fraction, by estimation, is 25 to 30%. The left ventricle has severely decreased function. The left ventricle demonstrates regional wall motion abnormalities. Definity  contrast agent was given IV to delineate the left ventricular endocardial borders. Left Atrium: Left atrial size was normal in size. Right Atrium: Right atrial size was normal in size. Mitral Valve: The mitral valve is normal in structure. Mild mitral valve regurgitation. No evidence of mitral valve stenosis. Tricuspid Valve: Tricuspid valve regurgitation is mild. Aortic Valve: The aortic valve is tricuspid. There is moderate calcification of the aortic valve. Aortic valve regurgitation is mild. No aortic stenosis is present. Aorta: The aortic root is normal in size and structure and aortic dilatation noted. There is mild dilatation of the aortic root, measuring 40 mm.  LV Volumes (MOD) LV vol d, MOD A2C: 142.0 ml LV vol d, MOD  A4C: 182.0 ml LV vol s, MOD A2C: 108.0 ml LV vol s, MOD A4C: 112.0 ml LV SV MOD A2C:     34.0 ml LV SV MOD A4C:     182.0 ml LV SV MOD BP:      57.0 ml RIGHT VENTRICLE RV S prime:     21.10 cm/s  AORTA Ao Root diam: 4.00 cm Ao Asc diam:  3.50 cm Dalton McleanMD Electronically signed by Ezra Kanner Signature Date/Time: 07/11/2024/1:35:24 PM    Final    CT Angio Chest/Abd/Pel for Dissection W and/or Wo Contrast Result Date: 07/10/2024 CLINICAL DATA:  Acute aortic syndrome suspected. Shortness of breath. EXAM: CT ANGIOGRAPHY CHEST, ABDOMEN AND PELVIS TECHNIQUE: Non-contrast CT of the chest was initially obtained. Multidetector CT imaging through the chest, abdomen and pelvis was performed using the standard protocol during bolus administration of intravenous contrast. Multiplanar reconstructed images and MIPs were obtained and reviewed to evaluate the vascular  anatomy. RADIATION DOSE REDUCTION: This exam was performed according to the departmental dose-optimization program which includes automated exposure control, adjustment of the mA and/or kV according to patient size and/or use of iterative reconstruction technique. CONTRAST:  75mL OMNIPAQUE  IOHEXOL  350 MG/ML SOLN COMPARISON:  Chest radiograph 07/10/2024. CT abdomen and pelvis 05/26/2024. CT chest 03/04/2022. PET CT 12/13/2022 FINDINGS: CTA CHEST FINDINGS Cardiovascular: Unenhanced images of the chest demonstrate diffuse calcification throughout the aorta and coronary arteries. No acute intramural hematoma. Images obtained during arterial phase after intravenous contrast material administration demonstrate patent common normal caliber thoracic aorta. No aortic dissection. Great vessel origins are patent. No filling defects in the central pulmonary arteries. No evidence of significant pulmonary embolus. Normal heart size although the left ventricle appears mildly dilated. No pericardial effusion. Mediastinum/Nodes: Esophagus is decompressed. No significant  lymphadenopathy. Thyroid gland is unremarkable. Lungs/Pleura: Emphysematous changes in the lungs. Bilateral basilar atelectasis. Peribronchial thickening with mild bronchiectasis consistent with chronic bronchitis. No pleural effusion or pneumothorax. Musculoskeletal: Degenerative changes in the spine. Compression of T11 and T12 vertebra, unchanged. Sclerotic lesions demonstrated in several thoracic vertebrae and ribs corresponding to known metastatic disease. Review of the MIP images confirms the above findings. CTA ABDOMEN AND PELVIS FINDINGS VASCULAR Aorta: Normal caliber aorta without aneurysm, dissection, vasculitis or significant stenosis. Diffuse aortic calcification. Celiac: Patent without evidence of aneurysm, dissection, vasculitis or significant stenosis. SMA: Patent without evidence of aneurysm, dissection, vasculitis or significant stenosis. Renals: Both renal arteries are patent without evidence of aneurysm, dissection, vasculitis, fibromuscular dysplasia or significant stenosis. IMA: Patent without evidence of aneurysm, dissection, vasculitis or significant stenosis. Inflow: Patent without evidence of aneurysm, dissection, vasculitis or significant stenosis. Veins: No obvious venous abnormality within the limitations of this arterial phase study. Review of the MIP images confirms the above findings. NON-VASCULAR Hepatobiliary: No focal liver abnormality is seen. No gallstones, gallbladder wall thickening, or biliary dilatation. Pancreas: Unremarkable. No pancreatic ductal dilatation or surrounding inflammatory changes. Spleen: Normal in size without focal abnormality. Adrenals/Urinary Tract: No adrenal gland nodules. Renal nephrograms are symmetrical but there is bilateral hydronephrosis and hydroureter down to the level of the bladder. No ureteral stones are identified. Changes likely indicate reflux disease or bladder outlet obstruction. Diffuse bladder wall thickening likely to represent radiation  change given history of prostate cancer. Bladder outlet obstruction could also have this appearance. Coarse calcifications in the dependent portion of the bladder or possibly in the bladder wall or prostate. This is unchanged since prior study. Stomach/Bowel: Stomach, small bowel, and colon are not abnormally distended. Stool throughout the colon. No wall thickening or inflammatory changes. Appendix is not identified. Lymphatic: No significant lymphadenopathy. Reproductive: Prostate gland is not well visualized and may be surgically absent or atrophic due to radiation therapy. In the prostate bed, there is increased soft tissue which extends into the anterolateral left pelvic sidewall and the left proximal hip adductor muscles. This likely represents residual tumor or post treatment changes. Pelvic soft tissue infiltration is increasing since the previous studies. Loss of fat plane between the soft tissues and the bladder. Pelvic sidewall soft tissue today measures 3.2 cm thickness. Other: No free air or free fluid in the abdomen. There is a large heterogeneous collection in the inferior left and central rectus abdominus muscles extending down to the symphysis pubis. This collection measures up to about 5.2 cm diameter. Stranding in the surrounding subcutaneous fat. This is likely an abscess. Musculoskeletal: Degenerative changes in the spine and hips. Scattered focal areas of bone sclerosis consistent  with known metastasis. Cortical erosion at the symphysis pubis may represent post treatment changes or osteomyelitis. Review of the MIP images confirms the above findings. IMPRESSION: 1. Aortic atherosclerosis. No evidence of aneurysm or dissection involving the thoracic or abdominal aorta. 2. Overall normal heart size with left ventricular dilatation. 3. Atelectasis in the lung bases. 4. Increasing soft tissue mass/infiltration in the low pelvis involving the prostate bed and extending to the left pelvic sidewall and  into the proximal left adductor muscles. This likely represents residual recurrent tumor versus post treatment changes or possibly inflammatory process. 5. Diffuse bladder wall thickening may be due to bladder outlet obstruction or treatment changes. Stable calcification in the bladder or prostate region. Bilateral hydronephrosis and hydroureter likely related to reflux or bladder outlet obstruction. 6. Low anterior rectus abdominus collection with surrounding stranding likely representing abscess. 7. Cortical erosion at the symphysis pubis may represent osteomyelitis or possibly radiation changes. 8. Multiple sclerotic bone lesions consistent with known metastatic disease. Electronically Signed   By: Elsie Gravely M.D.   On: 07/10/2024 23:03   DG Chest Port 1 View Result Date: 07/10/2024 CLINICAL DATA:  Question of sepsis to evaluate for abnormality. Diffuse abdominal pain for 2 weeks radiating down the legs. EXAM: PORTABLE CHEST 1 VIEW COMPARISON:  06/06/2024 FINDINGS: Shallow inspiration. Heart size and pulmonary vascularity are normal for technique. Peribronchial thickening with bronchiectasis in streaky perihilar opacities likely representing bronchitic changes. This could represent acute or chronic bronchitis. Scarring in the lung apices and bases. Probable linear atelectasis also in the lung bases. No pleural effusion or pneumothorax. Mediastinal contours appear intact. Calcification of the aorta. Poorly defined sclerotic lesions demonstrated in the scapula and ribs correlating with known metastatic disease. IMPRESSION: 1. Shallow inspiration. 2. Emphysematous changes, fibrosis, and bronchitic changes in the lungs. 3. Mild linear atelectasis in the lung bases. No focal consolidation. 4. Sclerotic bone lesions corresponding to known metastatic disease. Electronically Signed   By: Elsie Gravely M.D.   On: 07/10/2024 19:37     The total time spent in the appointment was 40 minutes encounter with  patients including review of chart and various tests results, discussions about plan of care and coordination of care plan   All questions were answered. The patient knows to call the clinic with any problems, questions or concerns. No barriers to learning was detected.  Olam JINNY Brunner, NP 10/10/202512:38 PM     ADDENDUM: I saw and examined Mr. Madani.  He is very nice.  Unfortunately, he is not all that good on dates and everything.  It sounds like he had a localized prostate cancer back in [place] .  His was he thinks maybe he for 5 years ago.  He said he received radiation therapy.  He said this messed me up.SABRA  He said he had 45 treatments.  He also had some Lupron.  He says his initial PSA was 11.  He has been up in [place] for a couple years I think.  Again, he really cannot tell me how long he has been up here.  He also cannot tell me how long he has been on Xtandi .  He just told me that his Urologist, Dr. Nieves, told him to take it to keep his PSA low.  He was seen by Dr. Patrcia he thinks about a month or so ago.  Looks like he may have had radiation treatments.  Again, I cannot find any notes from Dr. Patrcia.  Again I am not sure what  his PSA was.  He was admitted on I think maybe 07/11/2024.  He had lower abdominal pain.  This was evaluated with a CT scan.  This showed he had a anterior abdominal wall fluid collection which was felt to be an abscess.  He had soft tissue mass/infiltration in the low pelvis involving the prostate bed extending to the left pelvic sidewall and into the proximal left adductor muscles.  It was felt this was possible recurrent tumor versus inflammation.  Also a CT scan there was a fluid collection surrounding the anterior rectus abdominis muscle.  He had multiple sclerotic bone lesions.  Surprising, his PSA that was done today was only 3.4.  We will have to see what his testosterone  level is.  I would have hate to think that this is  castrate resistant prostate cancer.  This could be very difficult to treat.  We really need to have a PSMA PET scan.  I think 1 option if we do find that he has metastatic disease might be Pluvicto.  He definitely is not a candidate for systemic chemotherapy.  He had an aspirate done today.  It will be interesting to see what this comes up.  Hopefully, this was sent off for cytology.  Again, I think a PSMA PET scan will clearly show us  where he has metastatic disease.  If he has metastatic disease with a PSA of only 3.4, this could indicate a very aggressive prostate cancer.  I need to see what his testosterone  level is to see if he is at a castrate level.  He is very nice.  I really enjoyed talking with him.  We will have to follow along and again, see what comes up with the aspirate results.  Jeralyn Crease, MD  [place] [facility]:5

## 2024-07-12 NOTE — Progress Notes (Signed)
 [facility]   ROUNDING   NOTE CAPTAIN BLUCHER FMW:[id number]  DOB: 11/21/1933  DOA: 07/10/2024  PCP: [name], [name]  07/12/2024,8:26 AM  LOS: 1 day    Code Status: Full     from: Home  89 year old white male HFrEF 25-30% Metastatic lesions from prostatic cancer on Xtandi  managed previously by Dr. Cam Dr. Patrcia high if and has had radiation proctitis with anemia previously and underwent colonoscopy 05/28/2024 showing severe radiation proctitis with APC prohibiting full colonoscopy-at the time was given Carafate and instructed to follow-up as an outpatient Permanent A-fib not on anticoagulation complicated by severe orthostasis precluding rate control and not a good candidate for anticoagulation because of elevated has bled score HLD Previous channel TURP 03/01/2019 Dr. Cam  Hospitalized 9/4 through 06/10/2024 after a fall hitting the back of the counter was hypotensive went to the ICU had pressors and was placed on midodrine  that admission septic shock 2/2 UTI negative cultures completed 7 days cefadroxil  total at that admission also acute traumatic T11, chronic T12 and 3rd and 4th rib fractures which were managed conservatively per orthopedics with TLSO found to have left rectal wall hematoma as well General Surgery did not feel any role for intervention at the time   Patient followed up with nurse practitioner 9/12 after hospitalization he was taking aspirin  325 scheduled for iron  infusion and there was documentation of MAB requiring further workup  9/23 followed with cardiology-they document difficulty understanding what meds he takes and they decided to workup syncope and a 2-week live monitor was ordered but he was not felt to be an ICD candidate and GDMT was limited by his hypotension and they hinted at maybe having a goals of care discussion  10/8 brought to ED with lower abdominal pain radiating down to his legs over the past 7 to 10 days improves when he does not move or do any exercises  associated with shortness of breath occasionally   Labs on admit Sodium 133 potassium 4.2 BUN/creatinine 41/1.0 troponin 82 lactic acid 3.1 WBC 22 hemoglobin 8 platelet 360 INR 1.2 Blood culture X2 obtained Urine analysis negative Echocardiogram EF 25-30%   Imaging CXR 1 view shallow inspiration emphysematous changes fibrosis bronchitic changes linear atelectasis no focal consolidation sclerotic bone lesions known metastatic disease CT angio abdominal chest pelvis = increasing soft tissue mass infiltration low pelvis involving prostate bed extending to left pelvic sidewall and into proximal left adductor muscles?  Residual recurrent tumor versus posttreatment changes/possible inflammatory process-diffuse bladder wall thickening 2/2 Boop/posttreatment changes + calcification bladder prostatic region-bilateral hydronephrosis and hydroureter related to reflux or bladder outlet obstruction Low anterior rectus abdominis collection with surrounding stranding secondary to abscess? Cortical erosion symphysis pubis this?  Osteomyelitis radiation changes Multiple sclerotic bony lesions?  Metastatic disease   Transfused 2 units PRBC since admission With above constellation of findings General Surgery was consulted who felt IR should attempt to drain the area   Assessment  & Plan :    ?  Abscess intra-abdominal wall/infected rectus sheath hematoma Deferring to expertise general surgery/IR-has significant pain, continue Tylenol  650 every 6 as needed Add low-dose Norco 5 every 4 as needed after surgery For now use IV morphine  every 4 as needed Broad-spectrum vancomycin  and Zosyn going forward-fluid restrictive strategy given heart failure as before for right now I communicated with general surgery who feel that we will give a trial of IR intervention and then they may consider operative management as this looks like an abscess Hopefully he is  teed up for procedure I will ask cardiology to comment  given his multiple cardiac concerns-him being on aspirin  and their documentation at last office visit we may need to approach this from a more palliative/hospice perspective/approach Anemia of acute blood loss ? related to blood loss anemia versus possible anemia of malignancy versus spreading intra-abdominal rectus sheath hematoma 10/9 PM received another unit of blood Given his CAD his transfusion threshold is below 8 We are still awaiting labs from this morning Metastatic prostate cancer Seems to only follow-up with urology and XRT His imaging may be suggestive of widespread cancer I will ask oncology to see him in consult as he is only on Xtandi -I am not sure if his performance status would support other systemic chemo given his advanced heart failure and multiple systemic illnesses I will inform urology of his admission as he was managed by them initially to let them know that he is here although I suspect no further workup is necessary from Hypotension chronic related to probable cardiomyopathy Atrial fibrillation permanent CHADVASC 2 > 4 not on anticoagulation secondary to falls radiation proctitis-->with difficulty with rate control secondary to probable sick sinus syndrome CAD HFrEF chronic He has had episodes and admissions for syncope and was last hospitalized in September for syncope with hypotension requiring daily  midodrine  5 3 times daily He probably has an element of sick sinus syndrome precluding GDMT and aggressive medical Given possible extension of his hemorrhage and blood loss anemia I would probably be very careful and if he requires another transfusion would hold aspirin   Previous severe radiation proctitis precluding anticoagulation of above Supposed to be based on elevated CHADVASC of about 4 Have to watch for GI bleed I have held aspirin  going forward today in case he needs any intra-abdominal surgery and in case drainage placement is unsuccessful Previous fall with  multiple fractures as detailed above   I suspect his performance score is relatively low given his multiple systemic diseases but there may be oncological options per my discussion with Dr. Timmy who will see Appreciate general surgery expertise   Data Reviewed:   Hemoglobin 7.2 WBC down from 22-18 Platelet 309 BUN/creatinine 44/1.0-->33/0.9 LFTs normal  DVT prophylaxis: SCD  Status is: Inpatient Remains inpatient appropriate because:   Requires workup     Current Dispo: Inpatient currently     Subjective:    Awake alert no distress tells me he does not want me to touch his abdomen as this is quite painful Is n.p.o. and has been concerned about that He looks pretty stable does not seem to have any chest pain his main concern is again abdominal discomfort   Objective + exam Vitals:   07/11/24 1951 07/11/24 2013 07/12/24 0000 07/12/24 0740  BP:  (!) 110/49 (!) 102/59 (!) 99/56  Pulse:  72 78 81  Resp: 20 20 20 20   Temp: 98.3 F (36.8 C)  97.9 F (36.6 C) 98.3 F (36.8 C)  TempSrc: Oral  Oral Oral  SpO2:  97% 98% 95%  Weight:  67 kg    Height:  5' 11 (1.803 m)     Filed Weights   07/10/24 1755 07/11/24 2013  Weight: 64 kg 67 kg     Examination: EOMI NCAT looks about stated age PPM in place ROM intact no focal deficit S1-S2 no murmur Distention of lower quadrant with tenderness on shaking the bed in the lower quadrant I did not press on his abdomen at his request No lower extremity edema  Chest is clear Psych euthymic     Scheduled Meds:  aspirin  EC  81 mg Oral Daily   Chlorhexidine  Gluconate Cloth  6 each Topical Daily   ferrous sulfate   325 mg Oral Q breakfast   midodrine   5 mg Oral TID WC   pravastatin   20 mg Oral QPM   sodium chloride  flush  3 mL Intravenous Q12H   sodium chloride  flush  3 mL Intravenous Q12H   Continuous Infusions:  piperacillin-tazobactam (ZOSYN)  IV 3.375 g (07/12/24 0606)   vancomycin       Time 45  Colen Grimes, MD  [facility]

## 2024-07-12 NOTE — Consult Note (Signed)
 Cardiology Consultation   Patient ID: Ronald Kaiser MRN: [id number]; DOB: 13-May-1934  Admit date: 07/10/2024 Date of Consult: 07/12/2024  PCP:  [name], [name]    [facility]Care Providers Cardiologist:  Redell Shallow, MD     Patient Profile: Ronald Kaiser is a 89 y.o. male with a hx of CAD, paroxysmal atrial fibrillation not on anticoagulation, hypertension, hyperlipidemia, metastatic prostate cancer, ischemic cardiomyopathy who is being seen 07/12/2024 for preoperative evaluation at the request of Dr. Royal.  History of Present Illness: Ronald Kaiser is an 89 year old male with above medical history who is followed by Dr. Shallow.  Patient previously had STEMI in the year 2000 and received PTCA and stenting of his second diagonal.  Echocardiogram in 01/2016 showed EF 30-35% with grade 1 DD, mild AI, mild MR. Repeat cardiac catheterization in 03/2016 showed 50% proximal RCA disease, 45% proximal-distal LAD disease, 50% circumflex disease.  HE has a history of PAF in 2020. No documented recurrences. Patient had declined anticoagulation in the past.   Patient was seen by EP in 04/2024 for consideration of ICD for primary prevention of ventricular arrhythmias given persistent EF of 30%. Patient was not felt to not be a candidate for ICD due to his advanced age.   Admitted in 05/2024 with acute GI bleed secondary to severe radiation proctitis. Treated with APC. EGD was negative. Had orthostatic hypotension that was felt to be due to polypharmacy ad hypovolemia/anemia. Echocardiogram from 05/2024 showed EF 25-30% with regional wall motion abnormalities, normal RV systolic function, normal PA systolic pressure.   Admitted again in 06/2024 after he had an episode of weakness and had fallen backwards onto a counter. Sustained multiple back and rib fractures. Treated for shock, presumed septic. Did require ICU care and levophed . Discharged on midodrine .   Patient was last seen by  cardiology on 06/24/24. At that time, patient reported that he had overall been doing well since his hospitalization. He denied chest pain, SOB, orthopnea, peripheral edema, palpitations. A 2 week live monitor was ordered and is still in progress. GDMT was limited by hypotension requiring midodrine . Not on SGLT2i due to frequent UTIs.   Patient presented to the ED on 10/8 complaining of lower abdominal pain that radiated to bilateral legs. Also reported shortness of breath. In the ED, BNP 857. hsTn 58>82. WBC 23.2, hemoglobin 9.2. Lactic acid 3.1>1.0. CTA chest/abd/pelvis showed no evidence of aneurysm or dissection, overall normal heart size with LV dilatation, atelectasis in the lung bases. There was increasing soft tissue mass/infiltration in the low pelvis involving the prostate bed and extending to the left pelvic sidewall and into the proximal left adductor muscles. Likely represents residual recurrent tumor vs post treatment changes. There was diffuse bladder wlal thickening, bilateral hydronephrosis. Low anterior rectus abdominus collection with surrounding stranding that likely represented abscess.   Patient was admitted to the internal medicine service for treatment of sepsis due to abdominal wall muscle abscess and acute cystitis. Started on IV antibiotics. Given cautious IV fluid resuscitation. General surgery was consulted for infected abdominal wall hematoma vs abdominal wall abscess. IR was consulted and are planning aspiration/drainage. If this fails, patient may need operative drainage.   Cardiology consulted for preoperative evaluation.   On interview, patient reports that he feels like he has been doing well from a cardiac standpoint. Denies recent chest pain. No shortness of breath or lower extremity swelling. Reports that he was previously able to walk around his house without difficulty. He was recently sent to  rehab, where he started to have abdominal pain. His activity was a bit  limited by abdominal pain, but he did not have shortness of breath or chest pain    Past Medical History:  Diagnosis Date   AKI (acute kidney injury) 11/12/2018   CAD (coronary artery disease)    CHF (congestive heart failure) (HCC)    Dyspnea    Foley catheter in place    HTN (hypertension)    Hyperlipidemia    Myocardial infarction (HCC) 2000   REPORTS HE HAS HAD 5 HEART ATTACKS BUT SOME OF THE WERE NOT SERIOUS    Obesity    Prostate cancer [facility])     Past Surgical History:  Procedure Laterality Date   CARDIAC CATHETERIZATION N/A 03/09/2016   Procedure: Left Heart Cath and Coronary Angiography;  Surgeon: Victory LELON Sharps, MD;  Location: [facility] INVASIVE CV LAB;  Service: Cardiovascular;  Laterality: N/A;   COLONOSCOPY N/A 05/28/2024   Procedure: COLONOSCOPY;  Surgeon: Leigh Elspeth SQUIBB, MD;  Location: [facility] ENDOSCOPY;  Service: Gastroenterology;  Laterality: N/A;   CYSTOSCOPY W/ RETROGRADES Bilateral 08/19/2020   Procedure: BILATERAL RETROGRADE PYELOGRAM;  Surgeon: Cam Morene LELON, MD;  Location: [facility] ORS;  Service: Urology;  Laterality: Bilateral;   ESOPHAGOGASTRODUODENOSCOPY N/A 05/28/2024   Procedure: EGD (ESOPHAGOGASTRODUODENOSCOPY);  Surgeon: Leigh Elspeth SQUIBB, MD;  Location: [facility] ENDOSCOPY;  Service: Gastroenterology;  Laterality: N/A;   RADIATION TO PROSTATE     Stenting of LAD  2000   TONSILLECTOMY     TRANSURETHRAL RESECTION OF PROSTATE N/A 03/01/2019   Procedure: TRANSURETHRAL RESECTION OF THE PROSTATE (TURP);  Surgeon: Cam Morene LELON, MD;  Location: [facility] ORS;  Service: Urology;  Laterality: N/A;  75 MINS   TRANSURETHRAL RESECTION OF PROSTATE N/A 08/19/2020   Procedure: TRANSURETHRAL RESECTION OF THE PROSTATE (TURP);  Surgeon: Cam Morene LELON, MD;  Location: [facility] ORS;  Service: Urology;  Laterality: N/A;     Scheduled Meds:  Chlorhexidine  Gluconate Cloth  6 each Topical Daily   ferrous sulfate   325 mg Oral Q breakfast   midodrine   5 mg Oral TID WC   pravastatin   20 mg  Oral QPM   sodium chloride  flush  3 mL Intravenous Q12H   sodium chloride  flush  3 mL Intravenous Q12H   Continuous Infusions:  piperacillin-tazobactam (ZOSYN)  IV 3.375 g (07/12/24 0606)   vancomycin  1,250 mg (07/12/24 1047)   PRN Meds: acetaminophen  **OR** acetaminophen , morphine  injection, ondansetron  **OR** ondansetron  (ZOFRAN ) IV, sodium chloride  flush  Allergies:    Allergies  Allergen Reactions   Bactrim [Sulfamethoxazole-Trimethoprim] Hives, Shortness Of Breath and Swelling   Augmentin [Amoxicillin-Pot Clavulanate] Diarrhea   Crestor  [Rosuvastatin  Calcium ] Other (See Comments)    Back pain Grogginess  Dizziness    Zetia  [Ezetimibe ] Other (See Comments)    Back pain Grogginess  Dizziness     Social History:   Social History   Socioeconomic History   Marital status: Married    Spouse name: Not on file   Number of children: Not on file   Years of education: Not on file   Highest education level: Not on file  Occupational History    Comment: retired  Tobacco Use   Smoking status: Former    Current packs/day: 0.00    Average packs/day: 0.5 packs/day for 7.0 years (3.5 ttl pk-yrs)    Types: Cigarettes    Start date: 10/04/1955    Quit date: 10/03/1962    Years since quitting: 61.8   Smokeless tobacco: Never  Vaping Use  Vaping status: Never Used  Substance and Sexual Activity   Alcohol use: Yes    Comment: [facility] [employer]    Drug use: Not Currently   Sexual activity: Not Currently  Other Topics Concern   Not on file  Social History Narrative   Moved from [facility] to [place], [place] following divorce to be closer to his brother. Patient has remarried.   Social Drivers of Corporate investment banker Strain: Not on file  Food Insecurity: Patient Declined (07/12/2024)   Hunger Vital Sign    Worried About Running Out of Food in the Last Year: Patient declined    Ran Out of Food in the Last Year: Patient declined  Transportation Needs: Patient Declined  (07/12/2024)   PRAPARE - Administrator, Civil Service (Medical): Patient declined    Lack of Transportation (Non-Medical): Patient declined  Physical Activity: Not on file  Stress: Not on file  Social Connections: Patient Declined (07/12/2024)   Social Connection and Isolation Panel    Frequency of Communication with Friends and Family: Patient declined    Frequency of Social Gatherings with Friends and Family: Patient declined    Attends Religious Services: Patient declined    Database administrator or Organizations: Patient declined    Attends Banker Meetings: Patient declined    Marital Status: Patient declined  Recent Concern: Social Connections - Moderately Isolated (06/07/2024)   Social Connection and Isolation Panel    Frequency of Communication with Friends and Family: More than three times a week    Frequency of Social Gatherings with Friends and Family: More than three times a week    Attends Religious Services: Never    Database administrator or Organizations: No    Attends Banker Meetings: Never    Marital Status: Married  Catering manager Violence: Patient Declined (07/12/2024)   Humiliation, Afraid, Rape, and Kick questionnaire    Fear of Current or Ex-Partner: Patient declined    Emotionally Abused: Patient declined    Physically Abused: Patient declined    Sexually Abused: Patient declined    Family History:   Family History  Problem Relation Age of Onset   Hypertension Father    Breast cancer Neg Hx    Prostate cancer Neg Hx    Colon cancer Neg Hx    Pancreatic cancer Neg Hx      ROS:  Please see the history of present illness.   All other ROS reviewed and negative.     Physical Exam/Data: Vitals:   07/12/24 0000 07/12/24 0740 07/12/24 0800 07/12/24 1200  BP: (!) 102/59 (!) 99/56 (!) 102/59 (!) 104/52  Pulse: 78 81 79 79  Resp: 20 20 14  (!) 22  Temp: 97.9 F (36.6 C) 98.3 F (36.8 C)    TempSrc: Oral Oral     SpO2: 98% 95% 91% 92%  Weight:      Height:        Intake/Output Summary (Last 24 hours) at 07/12/2024 1310 Last data filed at 07/11/2024 1925 Gross per 24 hour  Intake 780 ml  Output 1600 ml  Net -820 ml      07/11/2024    8:13 PM 07/10/2024    5:55 PM 06/24/2024    3:29 PM  Last 3 Weights  Weight (lbs) 147 lb 11.3 oz 141 lb 140 lb 12.8 oz  Weight (kg) 67 kg 63.957 kg 63.866 kg     Body mass index is 20.6 kg/m.  General:  Frail elderly male, sitting upright in the bed in no acute distress  HEENT: normal Neck: no JVD Vascular: Radial pulses 2+ bilaterally Cardiac:  normal S1, S2; RRR; no murmurs  Lungs:  clear to auscultation bilaterally, no wheezing, rhonchi or rales. Normal WOB on room air  Ext: no edema in BLE  Musculoskeletal:  No deformities, BUE and BLE strength normal and equal Skin: warm and dry  Neuro:  CNs 2-12 intact, no focal abnormalities noted Psych:  Normal affect   EKG:  The EKG was personally reviewed and demonstrates:  Sinus tachycardia with HR 105 BPM. RBBB  Telemetry:  Telemetry was personally reviewed and demonstrates:  NSR with PACs   Relevant CV Studies: Cardiac Studies & Procedures   ______________________________________________________________________________________________ CARDIAC CATHETERIZATION  CARDIAC CATHETERIZATION 03/09/2016  Conclusion 1. Ost RCA to Prox RCA lesion, 50% stenosed. 2. Prox LAD to Dist LAD lesion, 45% stenosed. The lesion was previously treated with a stent (unknown type). 3. Prox Cx to Mid Cx lesion, 50% stenosed.   Mild-to-moderate LAD in-stent restenosis without any evidence of high-grade obstruction.  Moderate proximal RCA stenosis greater than 50%.  There is mild to moderate mid circumflex stenosis.  Low normal LV systolic function with an estimated EF 45-50%. Mildly elevated left ventricular end-diastolic pressures.  Abnormal noninvasive studies, high risk myocardial perfusion imaging without anatomy to  explain any evidence of ischemia. Decreased EF and scarring from prior infarction is the likely explanation for the interpretation.   RECOMMENDATION:   Continue heart failure therapy and risk factor modification.  Baseline noninvasive imaging is now understood and can be used as a basis for future comparison.  Findings Coronary Findings Diagnostic  Dominance: Right  Left Anterior Descending The lesion was previously treated with a stent (unknown type).  First Diagonal Branch The vessel is small in size.  Left Circumflex Eccentric.  First Obtuse Marginal Branch The vessel is small in size.  Third Obtuse Marginal Branch The vessel is small in size.  Right Coronary Artery The lesion is type C Eccentric.  Intervention  No interventions have been documented.   STRESS TESTS  MYOCARDIAL PERFUSION IMAGING 03/02/2016  Interpretation Summary  Nuclear stress EF: 42% with mid and distal anterior, anteroseptal and apical akinesis  The left ventricular ejection fraction is moderately decreased (30-44%).  There was no ST segment deviation noted during stress.  There is a small defect of moderate severity present in the basal inferoseptal and mid inferoseptal location. The defect is reversible and consistent with ischemia.  There is a large defect of severe severity present in the mid anterior, mid anteroseptal, apical anterior, apical septal and apex location. The defect is partially reversible and consistent with infarct and peri infarct ischemia.  This is a high risk study.   ECHOCARDIOGRAM  ECHOCARDIOGRAM LIMITED 07/11/2024  Narrative ECHOCARDIOGRAM LIMITED REPORT    Patient Name:   Ronald Kaiser Date of Exam: 07/11/2024 Medical Rec #:  [id number]      Height:       71.0 in Accession #:    [id number]     Weight:       141.0 lb Date of Birth:  1934/04/06     BSA:          1.817 m Patient Age:    89 years       BP:           96/51 mmHg Patient Gender: M  HR:           81 bpm. Exam Location:  Inpatient  Procedure: 2D Echo, Limited Color Doppler and Intracardiac Opacification Agent  Indications:    Congestive heart failure  History:        Patient has prior history of Echocardiogram examinations. CHF, CAD; Risk Factors:Hypertension.  Sonographer:    Charmaine Gaskins Referring Phys: [id number] SUBRINA SUNDIL  IMPRESSIONS   1. Left ventricular ejection fraction, by estimation, is 25 to 30%. The left ventricle has severely decreased function. The left ventricle demonstrates regional wall motion abnormalities with mid to apical anteroseptal and inferoseptal akinesis, apical anterior akinesis, apical lateral akinesis, apical inferior akinesis and akinesis of the true apex. LAD territory wall motion abnormalities. No LV thrombus visualized. 2. The mitral valve is normal in structure. Mild mitral valve regurgitation. No evidence of mitral stenosis. 3. The aortic valve is tricuspid. There is moderate calcification of the aortic valve. Aortic valve regurgitation is mild. No aortic stenosis is present. 4. Aortic dilatation noted. There is mild dilatation of the aortic root, measuring 40 mm. 5. Limited echo for LV function.  FINDINGS Left Ventricle: Left ventricular ejection fraction, by estimation, is 25 to 30%. The left ventricle has severely decreased function. The left ventricle demonstrates regional wall motion abnormalities. Definity  contrast agent was given IV to delineate the left ventricular endocardial borders.  Left Atrium: Left atrial size was normal in size.  Right Atrium: Right atrial size was normal in size.  Mitral Valve: The mitral valve is normal in structure. Mild mitral valve regurgitation. No evidence of mitral valve stenosis.  Tricuspid Valve: Tricuspid valve regurgitation is mild.  Aortic Valve: The aortic valve is tricuspid. There is moderate calcification of the aortic valve. Aortic valve regurgitation is mild. No aortic  stenosis is present.  Aorta: The aortic root is normal in size and structure and aortic dilatation noted. There is mild dilatation of the aortic root, measuring 40 mm.   LV Volumes (MOD) LV vol d, MOD A2C: 142.0 ml LV vol d, MOD A4C: 182.0 ml LV vol s, MOD A2C: 108.0 ml LV vol s, MOD A4C: 112.0 ml LV SV MOD A2C:     34.0 ml LV SV MOD A4C:     182.0 ml LV SV MOD BP:      57.0 ml  RIGHT VENTRICLE RV S prime:     21.10 cm/s   AORTA Ao Root diam: 4.00 cm Ao Asc diam:  3.50 cm  Dalton McleanMD Electronically signed by Ezra Kanner Signature Date/Time: 07/11/2024/1:35:24 PM    Final          ______________________________________________________________________________________________       Laboratory Data: High Sensitivity Troponin:   Recent Labs  Lab 07/10/24 1846 07/10/24 2119  TROPONINIHS 58* 82*     Chemistry Recent Labs  Lab 07/10/24 1846 07/10/24 2119 07/11/24 0537  NA SPECIMEN HEMOLYZED. HEMOLYSIS MAY AFFECT INTEGRITY OF RESULTS. 133* 135  K SPECIMEN HEMOLYZED. HEMOLYSIS MAY AFFECT INTEGRITY OF RESULTS. 4.2 3.7  CL SPECIMEN HEMOLYZED. HEMOLYSIS MAY AFFECT INTEGRITY OF RESULTS. 98 100  CO2 SPECIMEN HEMOLYZED. HEMOLYSIS MAY AFFECT INTEGRITY OF RESULTS. 22 23  GLUCOSE SPECIMEN HEMOLYZED. HEMOLYSIS MAY AFFECT INTEGRITY OF RESULTS. 128* 115*  BUN SPECIMEN HEMOLYZED. HEMOLYSIS MAY AFFECT INTEGRITY OF RESULTS. 41* 33*  CREATININE SPECIMEN HEMOLYZED. HEMOLYSIS MAY AFFECT INTEGRITY OF RESULTS. 1.04 0.90  CALCIUM  SPECIMEN HEMOLYZED. HEMOLYSIS MAY AFFECT INTEGRITY OF RESULTS. 7.9* 7.9*  GFRNONAA SPECIMEN HEMOLYZED. HEMOLYSIS MAY AFFECT INTEGRITY OF RESULTS. >60 >60  GFRAA SPECIMEN HEMOLYZED. HEMOLYSIS MAY AFFECT INTEGRITY OF RESULTS.  --   --   ANIONGAP SPECIMEN HEMOLYZED. HEMOLYSIS MAY AFFECT INTEGRITY OF RESULTS. 13 12    Recent Labs  Lab 07/10/24 1846 07/10/24 2119 07/11/24 0537  PROT SPECIMEN HEMOLYZED. HEMOLYSIS MAY AFFECT INTEGRITY OF RESULTS. 6.2*  5.5*  ALBUMIN SPECIMEN HEMOLYZED. HEMOLYSIS MAY AFFECT INTEGRITY OF RESULTS. 1.8* 1.5*  AST SPECIMEN HEMOLYZED. HEMOLYSIS MAY AFFECT INTEGRITY OF RESULTS. 22 15  ALT SPECIMEN HEMOLYZED. HEMOLYSIS MAY AFFECT INTEGRITY OF RESULTS. 17 15  ALKPHOS SPECIMEN HEMOLYZED. HEMOLYSIS MAY AFFECT INTEGRITY OF RESULTS. 61 55  BILITOT SPECIMEN HEMOLYZED. HEMOLYSIS MAY AFFECT INTEGRITY OF RESULTS. 0.4 0.4   Lipids No results for input(s): CHOL, TRIG, HDL, LABVLDL, LDLCALC, CHOLHDL in the last 168 hours.  Hematology Recent Labs  Lab 07/10/24 1846 07/10/24 2119 07/11/24 0537  WBC 23.2* 22.7* 18.8*  RBC 3.00* 2.62* 2.34*  HGB 9.2* 8.0* 7.2*  HCT 29.0* 25.2* 22.8*  MCV 96.7 96.2 97.4  MCH 30.7 30.5 30.8  MCHC 31.7 31.7 31.6  RDW 18.1* 18.0* 18.2*  PLT PLATELET CLUMPS NOTED ON SMEAR, UNABLE TO ESTIMATE 360 309   Thyroid No results for input(s): TSH, FREET4 in the last 168 hours.  BNP Recent Labs  Lab 07/10/24 1846  BNP 857.5*    DDimer No results for input(s): DDIMER in the last 168 hours.  Radiology/Studies:  ECHOCARDIOGRAM LIMITED Result Date: 07/11/2024    ECHOCARDIOGRAM LIMITED REPORT   Patient Name:   Ronald Kaiser Date of Exam: 07/11/2024 Medical Rec #:  [id number]      Height:       71.0 in Accession #:    [id number]     Weight:       141.0 lb Date of Birth:  Aug 14, 1934     BSA:          1.817 m Patient Age:    89 years       BP:           96/51 mmHg Patient Gender: M              HR:           81 bpm. Exam Location:  Inpatient Procedure: 2D Echo, Limited Color Doppler and Intracardiac Opacification Agent Indications:    Congestive heart failure  History:        Patient has prior history of Echocardiogram examinations. CHF,                 CAD; Risk Factors:Hypertension.  Sonographer:    Charmaine Gaskins Referring Phys: [id number] SUBRINA SUNDIL IMPRESSIONS  1. Left ventricular ejection fraction, by estimation, is 25 to 30%. The left ventricle has severely decreased function. The  left ventricle demonstrates regional wall motion abnormalities with mid to apical anteroseptal and inferoseptal akinesis, apical anterior akinesis, apical lateral akinesis, apical inferior akinesis and akinesis of the true apex. LAD territory wall motion abnormalities. No LV thrombus visualized.  2. The mitral valve is normal in structure. Mild mitral valve regurgitation. No evidence of mitral stenosis.  3. The aortic valve is tricuspid. There is moderate calcification of the aortic valve. Aortic valve regurgitation is mild. No aortic stenosis is present.  4. Aortic dilatation noted. There is mild dilatation of the aortic root, measuring 40 mm.  5. Limited echo for LV function. FINDINGS  Left Ventricle: Left ventricular ejection fraction, by estimation, is 25 to 30%. The left ventricle has severely decreased function. The left ventricle demonstrates regional wall motion abnormalities.  Definity  contrast agent was given IV to delineate the left ventricular endocardial borders. Left Atrium: Left atrial size was normal in size. Right Atrium: Right atrial size was normal in size. Mitral Valve: The mitral valve is normal in structure. Mild mitral valve regurgitation. No evidence of mitral valve stenosis. Tricuspid Valve: Tricuspid valve regurgitation is mild. Aortic Valve: The aortic valve is tricuspid. There is moderate calcification of the aortic valve. Aortic valve regurgitation is mild. No aortic stenosis is present. Aorta: The aortic root is normal in size and structure and aortic dilatation noted. There is mild dilatation of the aortic root, measuring 40 mm.  LV Volumes (MOD) LV vol d, MOD A2C: 142.0 ml LV vol d, MOD A4C: 182.0 ml LV vol s, MOD A2C: 108.0 ml LV vol s, MOD A4C: 112.0 ml LV SV MOD A2C:     34.0 ml LV SV MOD A4C:     182.0 ml LV SV MOD BP:      57.0 ml RIGHT VENTRICLE RV S prime:     21.10 cm/s  AORTA Ao Root diam: 4.00 cm Ao Asc diam:  3.50 cm Dalton McleanMD Electronically signed by Ezra Kanner  Signature Date/Time: 07/11/2024/1:35:24 PM    Final    CT Angio Chest/Abd/Pel for Dissection W and/or Wo Contrast Result Date: 07/10/2024 CLINICAL DATA:  Acute aortic syndrome suspected. Shortness of breath. EXAM: CT ANGIOGRAPHY CHEST, ABDOMEN AND PELVIS TECHNIQUE: Non-contrast CT of the chest was initially obtained. Multidetector CT imaging through the chest, abdomen and pelvis was performed using the standard protocol during bolus administration of intravenous contrast. Multiplanar reconstructed images and MIPs were obtained and reviewed to evaluate the vascular anatomy. RADIATION DOSE REDUCTION: This exam was performed according to the departmental dose-optimization program which includes automated exposure control, adjustment of the mA and/or kV according to patient size and/or use of iterative reconstruction technique. CONTRAST:  75mL OMNIPAQUE  IOHEXOL  350 MG/ML SOLN COMPARISON:  Chest radiograph 07/10/2024. CT abdomen and pelvis 05/26/2024. CT chest 03/04/2022. PET CT 12/13/2022 FINDINGS: CTA CHEST FINDINGS Cardiovascular: Unenhanced images of the chest demonstrate diffuse calcification throughout the aorta and coronary arteries. No acute intramural hematoma. Images obtained during arterial phase after intravenous contrast material administration demonstrate patent common normal caliber thoracic aorta. No aortic dissection. Great vessel origins are patent. No filling defects in the central pulmonary arteries. No evidence of significant pulmonary embolus. Normal heart size although the left ventricle appears mildly dilated. No pericardial effusion. Mediastinum/Nodes: Esophagus is decompressed. No significant lymphadenopathy. Thyroid gland is unremarkable. Lungs/Pleura: Emphysematous changes in the lungs. Bilateral basilar atelectasis. Peribronchial thickening with mild bronchiectasis consistent with chronic bronchitis. No pleural effusion or pneumothorax. Musculoskeletal: Degenerative changes in the spine.  Compression of T11 and T12 vertebra, unchanged. Sclerotic lesions demonstrated in several thoracic vertebrae and ribs corresponding to known metastatic disease. Review of the MIP images confirms the above findings. CTA ABDOMEN AND PELVIS FINDINGS VASCULAR Aorta: Normal caliber aorta without aneurysm, dissection, vasculitis or significant stenosis. Diffuse aortic calcification. Celiac: Patent without evidence of aneurysm, dissection, vasculitis or significant stenosis. SMA: Patent without evidence of aneurysm, dissection, vasculitis or significant stenosis. Renals: Both renal arteries are patent without evidence of aneurysm, dissection, vasculitis, fibromuscular dysplasia or significant stenosis. IMA: Patent without evidence of aneurysm, dissection, vasculitis or significant stenosis. Inflow: Patent without evidence of aneurysm, dissection, vasculitis or significant stenosis. Veins: No obvious venous abnormality within the limitations of this arterial phase study. Review of the MIP images confirms the above findings. NON-VASCULAR Hepatobiliary: No focal liver abnormality is  seen. No gallstones, gallbladder wall thickening, or biliary dilatation. Pancreas: Unremarkable. No pancreatic ductal dilatation or surrounding inflammatory changes. Spleen: Normal in size without focal abnormality. Adrenals/Urinary Tract: No adrenal gland nodules. Renal nephrograms are symmetrical but there is bilateral hydronephrosis and hydroureter down to the level of the bladder. No ureteral stones are identified. Changes likely indicate reflux disease or bladder outlet obstruction. Diffuse bladder wall thickening likely to represent radiation change given history of prostate cancer. Bladder outlet obstruction could also have this appearance. Coarse calcifications in the dependent portion of the bladder or possibly in the bladder wall or prostate. This is unchanged since prior study. Stomach/Bowel: Stomach, small bowel, and colon are not  abnormally distended. Stool throughout the colon. No wall thickening or inflammatory changes. Appendix is not identified. Lymphatic: No significant lymphadenopathy. Reproductive: Prostate gland is not well visualized and may be surgically absent or atrophic due to radiation therapy. In the prostate bed, there is increased soft tissue which extends into the anterolateral left pelvic sidewall and the left proximal hip adductor muscles. This likely represents residual tumor or post treatment changes. Pelvic soft tissue infiltration is increasing since the previous studies. Loss of fat plane between the soft tissues and the bladder. Pelvic sidewall soft tissue today measures 3.2 cm thickness. Other: No free air or free fluid in the abdomen. There is a large heterogeneous collection in the inferior left and central rectus abdominus muscles extending down to the symphysis pubis. This collection measures up to about 5.2 cm diameter. Stranding in the surrounding subcutaneous fat. This is likely an abscess. Musculoskeletal: Degenerative changes in the spine and hips. Scattered focal areas of bone sclerosis consistent with known metastasis. Cortical erosion at the symphysis pubis may represent post treatment changes or osteomyelitis. Review of the MIP images confirms the above findings. IMPRESSION: 1. Aortic atherosclerosis. No evidence of aneurysm or dissection involving the thoracic or abdominal aorta. 2. Overall normal heart size with left ventricular dilatation. 3. Atelectasis in the lung bases. 4. Increasing soft tissue mass/infiltration in the low pelvis involving the prostate bed and extending to the left pelvic sidewall and into the proximal left adductor muscles. This likely represents residual recurrent tumor versus post treatment changes or possibly inflammatory process. 5. Diffuse bladder wall thickening may be due to bladder outlet obstruction or treatment changes. Stable calcification in the bladder or prostate  region. Bilateral hydronephrosis and hydroureter likely related to reflux or bladder outlet obstruction. 6. Low anterior rectus abdominus collection with surrounding stranding likely representing abscess. 7. Cortical erosion at the symphysis pubis may represent osteomyelitis or possibly radiation changes. 8. Multiple sclerotic bone lesions consistent with known metastatic disease. Electronically Signed   By: Elsie Gravely M.D.   On: 07/10/2024 23:03   DG Chest Port 1 View Result Date: 07/10/2024 CLINICAL DATA:  Question of sepsis to evaluate for abnormality. Diffuse abdominal pain for 2 weeks radiating down the legs. EXAM: PORTABLE CHEST 1 VIEW COMPARISON:  06/06/2024 FINDINGS: Shallow inspiration. Heart size and pulmonary vascularity are normal for technique. Peribronchial thickening with bronchiectasis in streaky perihilar opacities likely representing bronchitic changes. This could represent acute or chronic bronchitis. Scarring in the lung apices and bases. Probable linear atelectasis also in the lung bases. No pleural effusion or pneumothorax. Mediastinal contours appear intact. Calcification of the aorta. Poorly defined sclerotic lesions demonstrated in the scapula and ribs correlating with known metastatic disease. IMPRESSION: 1. Shallow inspiration. 2. Emphysematous changes, fibrosis, and bronchitic changes in the lungs. 3. Mild linear atelectasis in the  lung bases. No focal consolidation. 4. Sclerotic bone lesions corresponding to known metastatic disease. Electronically Signed   By: Elsie Gravely M.D.   On: 07/10/2024 19:37     Assessment and Plan:  Preoperative Evaluation  Acute on Chronic HFrEF  Chronic Hypotension  - Patient currently admitted with sepsis.  Found to have infected abdominal wall hematoma versus abscess.  IR and general surgery have been consulted.  IR to attempt aspiration drainage.  If this is not successful, may need operative management.  Cardiology consulted for  preoperative recommendations - Patient's echocardiogram from this admission showed EF 25-30% with regional wall motion abnormalities, mild mitral valve regurgitation, mild aortic valve regurgitation - Patient also has history of CAD -previously had PTCA and stenting of second diagonal in 2000.  Most recent cath from 2017 showed 50% proximal RCA disease, 45% proximal-distal LAD disease, 50% circumflex diseas  - Patient has chronic hypotension requiring midodrine .  Because of his low blood pressure, patient unable to tolerate GDMT.  Not on SGLT2i due to recurrent UTIs  - Currently has acute on chronic anemia with hemoglobin down to 7.2. - Patient denies recent episodes of chest pain.  Denies shortness of breath.  Appears euvolemic on exam. - Patient is at an increased risk of perioperative complications due to his cardiac history and acute illness. However, he is optimized from a cardiac standpoint and no interventions would mitigate risk. MD to see, but unlikely we will pursue any further workup prior to possible surgery.  Patient underwent ultrasound aspiration today, scheduled for IR drainage tomorrow.  Hopefully will not need surgery  PAF  - Remote history in 2020.  Was not started on anticoagulation at that point and has not had no recurrence since - Maintaining normal sinus rhythm with PACs per telemetry  Otherwise per primary  - Prostate adenocarcinoma with bone mets  - History of severe radiation proctitis  - Sepsis  - Infected abdominal wall hematoma vs abscess  - Acute blood loss anemia - hemoglobin 7.2   Risk Assessment/Risk Scores:   For questions or updates, please contact [facility] Please consult [email or web address] for contact info under   Signed, Rollo FABIENE Louder, [name]  07/12/2024 1:10 PM

## 2024-07-13 DIAGNOSIS — L02211 Cutaneous abscess of abdominal wall: Secondary | ICD-10-CM | POA: Diagnosis not present

## 2024-07-13 LAB — CBC WITH DIFFERENTIAL/PLATELET
Abs Immature Granulocytes: 0.08 K/uL — ABNORMAL HIGH (ref 0.00–0.07)
Basophils Absolute: 0.1 K/uL (ref 0.0–0.1)
Basophils Relative: 1 %
Eosinophils Absolute: 0 K/uL (ref 0.0–0.5)
Eosinophils Relative: 0 %
HCT: 28.9 % — ABNORMAL LOW (ref 39.0–52.0)
Hemoglobin: 9.2 g/dL — ABNORMAL LOW (ref 13.0–17.0)
Immature Granulocytes: 1 %
Lymphocytes Relative: 7 %
Lymphs Abs: 0.9 K/uL (ref 0.7–4.0)
MCH: 29.5 pg (ref 26.0–34.0)
MCHC: 31.8 g/dL (ref 30.0–36.0)
MCV: 92.6 fL (ref 80.0–100.0)
Monocytes Absolute: 1 K/uL (ref 0.1–1.0)
Monocytes Relative: 8 %
Neutro Abs: 10.9 K/uL — ABNORMAL HIGH (ref 1.7–7.7)
Neutrophils Relative %: 83 %
Platelets: 333 K/uL (ref 150–400)
RBC: 3.12 MIL/uL — ABNORMAL LOW (ref 4.22–5.81)
RDW: 19.4 % — ABNORMAL HIGH (ref 11.5–15.5)
WBC: 13 K/uL — ABNORMAL HIGH (ref 4.0–10.5)
nRBC: 0 % (ref 0.0–0.2)

## 2024-07-13 LAB — HEPATIC FUNCTION PANEL
ALT: 13 U/L (ref 0–44)
AST: 15 U/L (ref 15–41)
Albumin: <1.5 g/dL — ABNORMAL LOW (ref 3.5–5.0)
Alkaline Phosphatase: 57 U/L (ref 38–126)
Bilirubin, Direct: <0.1 mg/dL (ref 0.0–0.2)
Total Bilirubin: 0.4 mg/dL (ref 0.0–1.2)
Total Protein: 5.6 g/dL — ABNORMAL LOW (ref 6.5–8.1)

## 2024-07-13 LAB — URINE CULTURE: Culture: >=100000 — AB

## 2024-07-13 LAB — BASIC METABOLIC PANEL WITH GFR
Anion gap: 14 (ref 5–15)
BUN: 19 mg/dL (ref 8–23)
CO2: 25 mmol/L (ref 22–32)
Calcium: 7.7 mg/dL — ABNORMAL LOW (ref 8.9–10.3)
Chloride: 96 mmol/L — ABNORMAL LOW (ref 98–111)
Creatinine, Ser: 0.79 mg/dL (ref 0.61–1.24)
GFR, Estimated: >60 mL/min (ref >60)
Glucose, Bld: 102 mg/dL — ABNORMAL HIGH (ref 70–99)
Potassium: 3.4 mmol/L — ABNORMAL LOW (ref 3.5–5.1)
Sodium: 135 mmol/L (ref 135–145)

## 2024-07-13 MED ORDER — MIDODRINE HCL 5 MG PO TABS
10.0000 mg | ORAL_TABLET | Freq: Three times a day (TID) | ORAL | Status: DC
  Administered 2024-07-13 – 2024-07-23 (×30): 10 mg via ORAL
  Filled 2024-07-13 (×31): qty 2

## 2024-07-13 MED ORDER — MIDODRINE HCL 5 MG PO TABS
5.0000 mg | ORAL_TABLET | Freq: Once | ORAL | Status: AC
  Administered 2024-07-13: 5 mg via ORAL

## 2024-07-13 NOTE — Plan of Care (Signed)
 Reviewed POC with pt and his brother at bedside.  Answered questions and explained medication actions and continued care with oncology.  Both verbalized understanding.

## 2024-07-13 NOTE — Plan of Care (Signed)

## 2024-07-13 NOTE — Progress Notes (Signed)
 [facility]   ROUNDING   NOTE Ronald Kaiser FMW:[id number]  DOB: 1934-08-21  DOA: 07/10/2024  PCP: [name], [name]  07/13/2024,11:23 AM  LOS: 2 days    Code Status: Full     from: Home  89 year old white male HFrEF 25-30% Metastatic lesions from prostatic cancer on Xtandi  managed previously by Dr. Cam Dr. Patrcia high if and has had radiation proctitis with anemia previously and underwent colonoscopy 05/28/2024 showing severe radiation proctitis with APC prohibiting full colonoscopy-at the time was given Carafate and instructed to follow-up as an outpatient Permanent A-fib not on anticoagulation complicated by severe orthostasis precluding rate control and not a good candidate for anticoagulation because of elevated has bled score HLD Previous channel TURP 03/01/2019 Dr. Cam  Hospitalized 9/4 through 06/10/2024 after a fall hitting the back of the counter was hypotensive went to the ICU had pressors and was placed on midodrine  that admission septic shock 2/2 UTI negative cultures completed 7 days cefadroxil  total at that admission also acute traumatic T11, chronic T12 and 3rd and 4th rib fractures which were managed conservatively per orthopedics with TLSO found to have left rectal wall hematoma as well General Surgery did not feel any role for intervention at the time   Patient followed up with nurse practitioner 9/12 after hospitalization he was taking aspirin  325 scheduled for iron  infusion and there was documentation of MAB requiring further workup  9/23 followed with cardiology-they document difficulty understanding what meds he takes and they decided to workup syncope and a 2-week live monitor was ordered but he was not felt to be an ICD candidate and GDMT was limited by his hypotension and they hinted at maybe having a goals of care discussion  10/8 brought to ED with lower abdominal pain radiating down to his legs over the past 7 to 10 days improves when he does not move or do any  exercises associated with shortness of breath occasionally   Labs on admit Sodium 133 potassium 4.2 BUN/creatinine 41/1.0 troponin 82 lactic acid 3.1 WBC 22 hemoglobin 8 platelet 360 INR 1.2 Blood culture X2 obtained Urine analysis negative Echocardiogram EF 25-30%  Imaging CXR 1 view shallow inspiration emphysematous changes fibrosis bronchitic changes linear atelectasis no focal consolidation sclerotic bone lesions known metastatic disease CT angio abdominal chest pelvis = increasing soft tissue mass infiltration low pelvis involving prostate bed extending to left pelvic sidewall and into proximal left adductor muscles?  Residual recurrent tumor versus posttreatment changes/possible inflammatory process-diffuse bladder wall thickening 2/2 Boop/posttreatment changes + calcification bladder prostatic region-bilateral hydronephrosis and hydroureter related to reflux or bladder outlet obstruction Low anterior rectus abdominis collection with surrounding stranding secondary to abscess? Cortical erosion symphysis pubis this?  Osteomyelitis radiation changes Multiple sclerotic bony lesions?  Metastatic disease   Transfused 2 units PRBC since admission With above constellation of findings General Surgery was consulted who felt IR should attempt to drain the area 10/10 IR drainage 30 cc body wall fluid collection sent for culture inclusive of cytology    Assessment  & Plan :    ?  Abscess intra-abdominal wall/infected rectus sheath hematoma Cardiology has cleared be at high risk for surgery if emergent Intra-abdominal abscess culture data shows gram-positive cocci-patient on Vanco Zosyn do not narrow--- white count responding Defer to general surgery/IR -he may not be able to get out of this situation without operative management Anemia of acute blood loss Appropriate rise of hemoglobin with transfusions as prior Watch closely Metastatic prostate cancer-treated with XRT and  resection Neurology aware nothing to do-D/W Dr. Timmy oncology PSA negative Testosterone  level is pending Further workup as per them and prognostication Hypotension chronic related to probable cardiomyopathy Atrial fibrillation permanent CHADVASC 2 > 4 not on anticoagulation secondary to falls radiation proctitis-->with difficulty with rate control secondary to probable sick sinus syndrome CAD HFrEF chronic-echo this admission EF 25-30% He has had episodes and admissions for syncope and was last hospitalized in September for syncope with hypotension requiring daily  midodrine   ASA held indeficntely He is a little hypotensive today so I have increased his midodrine  to 10 3 times daily-I would watch him closely for sepsis physiology but think that he is overall stable from prior Previous severe radiation proctitis precluding anticoagulation of above Supposed to be based on elevated CHADVASC of about 4 Have to watch for GI bleed Previous fall with multiple fractures as detailed above   Hemodynamically stable but potential for significant decompensation given multiple systemic diseases  Data Reviewed:   Sodium 135 potassium 3.4 BUN/creatinine 19/0.7 WBC 13 hemoglobin 9 platelet 333 Pathologies and other still pending  DVT prophylaxis: SCD  Status is: Inpatient Remains inpatient appropriate because:   Requires workup     Current Dispo: Inpatient currently     Subjective:   Looks better has not been out of bed though has pain when he moves out in and out of bed overall the pain seems a little better Eating some No chest pain No nausea no vomiting  Objective + exam Vitals:   07/12/24 2000 07/13/24 0000 07/13/24 0400 07/13/24 0735  BP: (!) 92/59 (!) 96/48 (!) 108/50 (!) 99/49  Pulse: 86 77 77 84  Resp: 16 (!) 21 17 20   Temp: 97.9 F (36.6 C) 98 F (36.7 C) 98.1 F (36.7 C) 98.1 F (36.7 C)  TempSrc: Oral  Oral Oral  SpO2: 94% 93% 94% 96%  Weight:      Height:        Filed Weights   07/10/24 1755 07/11/24 2013  Weight: 64 kg 67 kg     Examination:  Coherent pleasant no distress Looks about stated age Supraclavicular bitemporal wasting Moderate dentition No icterus no pallor Abdomen is slightly less tender I see a bandage over the area that they drained the fluid It is not red or erythematous No lower extremity edema Power is 5/5    Scheduled Meds:  Chlorhexidine  Gluconate Cloth  6 each Topical Daily   enoxaparin (LOVENOX) injection  40 mg Subcutaneous Q24H   ferrous sulfate   325 mg Oral Q breakfast   midodrine   10 mg Oral TID WC   pravastatin   20 mg Oral QPM   sodium chloride  flush  3 mL Intravenous Q12H   sodium chloride  flush  3 mL Intravenous Q12H   Continuous Infusions:  piperacillin-tazobactam (ZOSYN)  IV 3.375 g (07/13/24 0706)   vancomycin  1,250 mg (07/13/24 0511)    Time 45  Colen Grimes, MD  [facility]

## 2024-07-13 NOTE — Progress Notes (Signed)
 Subjective/Chief Complaint: Pt s/p drainage of muscular fluid collection by IR   Objective: Vital signs in last 24 hours: Temp:  [97.9 F (36.6 C)-98.8 F (37.1 C)] 98.1 F (36.7 C) (10/11 0735) Pulse Rate:  [72-86] 84 (10/11 0735) Resp:  [16-23] 20 (10/11 0735) BP: (92-108)/(48-59) 99/49 (10/11 0735) SpO2:  [92 %-100 %] 96 % (10/11 0735)    Intake/Output from previous day: 10/10 0701 - 10/11 0700 In: -  Out: 1225 [Urine:1225] Intake/Output this shift: No intake/output data recorded.  PE:  Constitutional: No acute distress, conversant, appears states age. Eyes: Anicteric sclerae, moist conjunctiva, no lid lag Lungs: Clear to auscultation bilaterally, normal respiratory effort CV: regular rate and rhythm, no murmurs, no peripheral edema, pedal pulses 2+ GI: Soft, no masses or hepatosplenomegaly,  suprapubic area firm, nttp Skin: No rashes, palpation reveals normal turgor Psychiatric: appropriate judgment and insight, oriented to person, place, and time   Lab Results:  Recent Labs    07/12/24 2057 07/13/24 0538  WBC 14.4* 13.0*  HGB 9.0* 9.2*  HCT 28.6* 28.9*  PLT 325 333   BMET Recent Labs    07/12/24 2057 07/13/24 0538  NA 132* 135  K 3.6 3.4*  CL 97* 96*  CO2 23 25  GLUCOSE 155* 102*  BUN 21 19  CREATININE 0.94 0.79  CALCIUM  7.7* 7.7*   PT/INR Recent Labs    07/10/24 1913  LABPROT 15.9*  INR 1.2  Studies/Results: US  Abscess Drain Result Date: 07/12/2024 INDICATION: Severe lower abdominal pain. CT demonstrates Low anterior rectus abdominus collection with surrounding stranding likely representing abscess. EXAM: U/S ASPIRATION OF ANTERIOR BODY WALL COLLECTION MEDICATIONS: No periprocedural antibiotics were indicated ANESTHESIA/SEDATION: 1% lidocaine  subcutaneous COMPLICATIONS: None immediate. PROCEDURE: Informed written consent was obtained from the patient after a thorough discussion of the procedural risks, benefits and alternatives. All  questions were addressed. Maximal Sterile Barrier Technique was utilized including caps, mask, sterile gowns, sterile gloves, sterile drape, hand hygiene and skin antiseptic. A timeout was performed prior to the initiation of the procedure. Survey ultrasound of the left pelvic anterior body wall revealed a complex fluid collection corresponding to recent CT findings. An appropriate lateral skin entry site was determined and marked, then prepped with chlorhexidine , draped in usual sterile fashion, infiltrated locally with 1% lidocaine . Under real-time ultrasound guidance, a 10 cm multi sidehole 5 [place] Yueh sheath needle advanced into the collection. Approximately 30 mL of cloudy serosanguineous fluid were aspirated using 2 passes. These were sent for culture and sensitivity. Postprocedure scans show significant decompression of the collection, with small residual fluid. The patient tolerated the procedure well. IMPRESSION: 1. Technically successful ultrasound-guided aspiration of left anterior pelvic body wall collection, removing 30 mL cloudy fluid, sent for Gram stain and culture. Electronically Signed   By: JONETTA Faes M.D.   On: 07/12/2024 16:07   ECHOCARDIOGRAM LIMITED Result Date: 07/11/2024    ECHOCARDIOGRAM LIMITED REPORT   Patient Name:   Ronald Kaiser Date of Exam: 07/11/2024 Medical Rec #:  [id number]      Height:       71.0 in Accession #:    [id number]     Weight:       141.0 lb Date of Birth:  [birth year]     BSA:          1.817 m Patient Age:    89 years       BP:           96/51 mmHg Patient Gender: M  HR:           81 bpm. Exam Location:  Inpatient Procedure: 2D Echo, Limited Color Doppler and Intracardiac Opacification Agent Indications:    Congestive heart failure  History:        Patient has prior history of Echocardiogram examinations. CHF,                 CAD; Risk Factors:Hypertension.  Sonographer:    Charmaine Gaskins Referring Phys: [id number] SUBRINA SUNDIL IMPRESSIONS  1. Left  ventricular ejection fraction, by estimation, is 25 to 30%. The left ventricle has severely decreased function. The left ventricle demonstrates regional wall motion abnormalities with mid to apical anteroseptal and inferoseptal akinesis, apical anterior akinesis, apical lateral akinesis, apical inferior akinesis and akinesis of the true apex. LAD territory wall motion abnormalities. No LV thrombus visualized.  2. The mitral valve is normal in structure. Mild mitral valve regurgitation. No evidence of mitral stenosis.  3. The aortic valve is tricuspid. There is moderate calcification of the aortic valve. Aortic valve regurgitation is mild. No aortic stenosis is present.  4. Aortic dilatation noted. There is mild dilatation of the aortic root, measuring 40 mm.  5. Limited echo for LV function. FINDINGS  Left Ventricle: Left ventricular ejection fraction, by estimation, is 25 to 30%. The left ventricle has severely decreased function. The left ventricle demonstrates regional wall motion abnormalities. Definity  contrast agent was given IV to delineate the left ventricular endocardial borders. Left Atrium: Left atrial size was normal in size. Right Atrium: Right atrial size was normal in size. Mitral Valve: The mitral valve is normal in structure. Mild mitral valve regurgitation. No evidence of mitral valve stenosis. Tricuspid Valve: Tricuspid valve regurgitation is mild. Aortic Valve: The aortic valve is tricuspid. There is moderate calcification of the aortic valve. Aortic valve regurgitation is mild. No aortic stenosis is present. Aorta: The aortic root is normal in size and structure and aortic dilatation noted. There is mild dilatation of the aortic root, measuring 40 mm.  LV Volumes (MOD) LV vol d, MOD A2C: 142.0 ml LV vol d, MOD A4C: 182.0 ml LV vol s, MOD A2C: 108.0 ml LV vol s, MOD A4C: 112.0 ml LV SV MOD A2C:     34.0 ml LV SV MOD A4C:     182.0 ml LV SV MOD BP:      57.0 ml RIGHT VENTRICLE RV S prime:      21.10 cm/s  AORTA Ao Root diam: 4.00 cm Ao Asc diam:  3.50 cm Dalton McleanMD Electronically signed by Ezra Kanner Signature Date/Time: 07/11/2024/1:35:24 PM    Final     Anti-infectives: Anti-infectives (From admission, onward)    Start     Dose/Rate Route Frequency Ordered Stop   07/12/24 0000  vancomycin  (VANCOREADY) IVPB 1250 mg/250 mL        1,250 mg 166.7 mL/hr over 90 Minutes Intravenous Every 24 hours 07/11/24 0057     07/11/24 0600  piperacillin-tazobactam (ZOSYN) IVPB 3.375 g        3.375 g 12.5 mL/hr over 240 Minutes Intravenous Every 8 hours 07/11/24 0057     07/11/24 0000  metroNIDAZOLE (FLAGYL) IVPB 500 mg        500 mg 100 mL/hr over 60 Minutes Intravenous  Once 07/10/24 2348 07/11/24 0626   07/11/24 0000  vancomycin  (VANCOREADY) IVPB 1500 mg/300 mL        1,500 mg 150 mL/hr over 120 Minutes Intravenous  Once 07/10/24 2353 07/11/24 9487  07/10/24 2315  ceFEPIme  (MAXIPIME ) 2 g in sodium chloride  0.9 % 100 mL IVPB        2 g 200 mL/hr over 30 Minutes Intravenous  Once 07/10/24 2304 07/11/24 0219       Assessment/Plan: Infected abdominal wall hematoma versus abscess -Appreciate interventional radiology attempting drainage -If interventional radiology drainage Cardiac history including decreased ejection fraction noted, will have to consider the risks of anesthesia if he does require surgical drainage -f/u cultures -CCS will follow closely   FEN: reg IVF per primary VTE: Lovenox ID: [email or web address]   Metastatic prostate cancer with signs of progressive disease in the pelvis Acute on chronic CHF Chronic hypotension on midodrine  A-fib not on anticoagulation CAD    LOS: 1 day    I reviewed nursing notes, hospitalist notes, last 24 h vitals and pain scores, last 48 h intake and output, last 24 h labs and trends, and last 24 h imaging results.   This care required moderate level of medical decision making.   LOS: 2 days    Lynda Leos 07/13/2024

## 2024-07-14 DIAGNOSIS — L02211 Cutaneous abscess of abdominal wall: Secondary | ICD-10-CM | POA: Diagnosis not present

## 2024-07-14 LAB — CBC WITH DIFFERENTIAL/PLATELET
Abs Immature Granulocytes: 0.06 K/uL (ref 0.00–0.07)
Basophils Absolute: 0.1 K/uL (ref 0.0–0.1)
Basophils Relative: 1 %
Eosinophils Absolute: 0.1 K/uL (ref 0.0–0.5)
Eosinophils Relative: 0 %
HCT: 29 % — ABNORMAL LOW (ref 39.0–52.0)
Hemoglobin: 9.1 g/dL — ABNORMAL LOW (ref 13.0–17.0)
Immature Granulocytes: 1 %
Lymphocytes Relative: 9 %
Lymphs Abs: 1 K/uL (ref 0.7–4.0)
MCH: 29.6 pg (ref 26.0–34.0)
MCHC: 31.4 g/dL (ref 30.0–36.0)
MCV: 94.5 fL (ref 80.0–100.0)
Monocytes Absolute: 0.6 K/uL (ref 0.1–1.0)
Monocytes Relative: 5 %
Neutro Abs: 9.7 K/uL — ABNORMAL HIGH (ref 1.7–7.7)
Neutrophils Relative %: 84 %
Platelets: 328 K/uL (ref 150–400)
RBC: 3.07 MIL/uL — ABNORMAL LOW (ref 4.22–5.81)
RDW: 19 % — ABNORMAL HIGH (ref 11.5–15.5)
Smear Review: NORMAL
WBC: 11.5 K/uL — ABNORMAL HIGH (ref 4.0–10.5)
nRBC: 0 % (ref 0.0–0.2)

## 2024-07-14 LAB — BASIC METABOLIC PANEL WITH GFR
Anion gap: 14 (ref 5–15)
BUN: 14 mg/dL (ref 8–23)
CO2: 25 mmol/L (ref 22–32)
Calcium: 7.7 mg/dL — ABNORMAL LOW (ref 8.9–10.3)
Chloride: 98 mmol/L (ref 98–111)
Creatinine, Ser: 0.9 mg/dL (ref 0.61–1.24)
GFR, Estimated: >60 mL/min (ref >60)
Glucose, Bld: 159 mg/dL — ABNORMAL HIGH (ref 70–99)
Potassium: 3.4 mmol/L — ABNORMAL LOW (ref 3.5–5.1)
Sodium: 137 mmol/L (ref 135–145)

## 2024-07-14 LAB — HEPATIC FUNCTION PANEL
ALT: 12 U/L (ref 0–44)
AST: 17 U/L (ref 15–41)
Albumin: <1.5 g/dL — ABNORMAL LOW (ref 3.5–5.0)
Alkaline Phosphatase: 56 U/L (ref 38–126)
Bilirubin, Direct: <0.1 mg/dL (ref 0.0–0.2)
Total Bilirubin: 0.3 mg/dL (ref 0.0–1.2)
Total Protein: 5.7 g/dL — ABNORMAL LOW (ref 6.5–8.1)

## 2024-07-14 LAB — TESTOSTERONE: Testosterone: 30 ng/dL — ABNORMAL LOW (ref 264–916)

## 2024-07-14 NOTE — Plan of Care (Signed)

## 2024-07-14 NOTE — Progress Notes (Signed)
 [facility]   ROUNDING   NOTE Ronald Kaiser FMW:[id number]  DOB: 06/30/1934  DOA: 07/10/2024  PCP: [name], [name]  07/14/2024,11:05 AM  LOS: 3 days    Code Status:  full     From:  home    89 year old white male HFrEF 25-30% Metastatic lesions from prostatic cancer on Xtandi  managed previously by Dr. Cam Dr. Patrcia high if and has had radiation proctitis with anemia previously and underwent colonoscopy 05/28/2024 showing severe radiation proctitis with APC prohibiting full colonoscopy-at the time was given Carafate and instructed to follow-up as an outpatient Permanent A-fib not on anticoagulation complicated by severe orthostasis precluding rate control and not a good candidate for anticoagulation because of elevated has bled score HLD Previous channel TURP 03/01/2019 Dr. Cam   Hospitalized 9/4 through 06/10/2024 after a fall hitting the back of the counter was hypotensive went to the ICU had pressors and was placed on midodrine  that admission septic shock 2/2 UTI negative cultures completed 7 days cefadroxil  total at that admission also acute traumatic T11, chronic T12 and 3rd and 4th rib fractures which were managed conservatively per orthopedics with TLSO found to have left rectal wall hematoma as well General Surgery did not feel any role for intervention at the time     Patient followed up with nurse practitioner 9/12 after hospitalization he was taking aspirin  325 scheduled for iron  infusion and there was documentation of MAB requiring further workup   9/23 followed with cardiology-they document difficulty understanding what meds he takes and they decided to workup syncope and a 2-week live monitor was ordered but he was not felt to be an ICD candidate and GDMT was limited by his hypotension and they hinted at maybe having a goals of care discussion   10/8 brought to ED with lower abdominal pain radiating down to his legs over the past 7 to 10 days improves when he does not move or do  any exercises associated with shortness of breath occasionally     Labs on admit Sodium 133 potassium 4.2 BUN/creatinine 41/1.0 troponin 82 lactic acid 3.1 WBC 22 hemoglobin 8 platelet 360 INR 1.2 Blood culture X2 obtained Urine analysis negative Echocardiogram EF 25-30%   Imaging CXR 1 view shallow inspiration emphysematous changes fibrosis bronchitic changes linear atelectasis no focal consolidation sclerotic bone lesions known metastatic disease CT angio abdominal chest pelvis = increasing soft tissue mass infiltration low pelvis involving prostate bed extending to left pelvic sidewall and into proximal left adductor muscles?  Residual recurrent tumor versus posttreatment changes/possible inflammatory process-diffuse bladder wall thickening 2/2 Boop/posttreatment changes + calcification bladder prostatic region-bilateral hydronephrosis and hydroureter related to reflux or bladder outlet obstruction Low anterior rectus abdominis collection with surrounding stranding secondary to abscess? Cortical erosion symphysis pubis this?  Osteomyelitis radiation changes Multiple sclerotic bony lesions?  Metastatic disease   Chronology  Transfused 2 units PRBC since admission With above constellation of findings General Surgery was consulted who felt IR should attempt to drain the area 10/10 IR drainage 30 cc body wall fluid collection sent for culture inclusive of cytology   Assessment  & Plan :    Intra-abdominal abscess/infected rectus sheath hematoma Cultures growing gram-positive cocci in clusters -Continues vancomycin  and Zosyn would not narrow until we have a clear plan between IR and general surgery Defer imaging to general surgery and timing of the same White countResponding some to antibiotic management Continues on hydrocodone 1 every 4 as needed moderate, IV morphine  1 every 4 as needed  severe pain Anemia acute blood loss?  Secondary to hematoma Transfusions as above Hemoglobin has  maintained stability Continue p.o. iron  325 Metastatic prostate cancer XRT resection-Gleason 4+4 initially treated 2003- ADT X 3 years Dr. Francella Guadalupe  2019 bone scan restaging showed right sixth rib- TURP 03/01/2019 recurrent prostate cancer involving 95% resected tissue with increase in PSA to 6.55 in 2020 Repeat TURP 08/19/2020 He had a PET scan last 02/01/2021 and underwent XRT as above PSA NORMAL TESTOSTERONE  LESS THAN 30  Defer to oncology/radiation oncology how they want to approach this If this is castrate resistant cancer-verbal report from Dr. Timmy indicates that there may be some treatment options? Cytology from the biopsy performed was ordered and hopefully will be added on Atrial fibrillation CHADVASC >4 not on anticoagulation has bled score >4 Hypotension related cardiomyopathy CAD chronic HFrEF With his cardiomyopathy he was increased from his usual dose of midodrine  to 10 3 times daily he looks like he got a dose overnight additionally He is not a candidate for GDMT for his CAD not really candidate for diuretics-is not volume overloaded-his statin from admission has been held, I have held his Lasix  from prior to admission additionally Previous radiation proctitis with GI bleed Poor  candidacy of anticoagulation or even aspirin  at this time Defer to cardiology when to resume the same Previous fall with multiple fractures  Data Reviewed:  WBC 11.5 hemoglobin 9.1 platelet 328 Sodium 137 potassium 3.4 BUN/creatinine 14/0.9 alk phos 56 AST/ALT 17/12 bilirubin 0.3  DVT prophylaxis: Lovenox  Status is: Inpatient Remains inpatient appropriate because: Requires a durable longer term plan with regards to imaging and next apps as per general surgery    Dispo: Inpatient   Time 50 including review of old records   Subjective:   Awake coherent in nad --not blood pressures somewhat soft---not symptomatic Pain in much improved No cP fever chills n/v Abd soft nt nd no  rebound no guard   Objective + exam Vitals:   07/13/24 2348 07/14/24 0000 07/14/24 0422 07/14/24 0742  BP: (!) 103/52 (!) 105/52 (!) 114/99 (!) 96/57  Pulse:  83 82 84  Resp:  (!) 22 20 (!) 32  Temp: (!) 97.4 F (36.3 C)  97.6 F (36.4 C) (!) 97.4 F (36.3 C)  TempSrc: Oral  Oral Axillary  SpO2:  92% 95% 93%  Weight:      Height:       Filed Weights   07/10/24 1755 07/11/24 2013  Weight: 64 kg 67 kg   Examination: Awake cohenet in nad Mildy decreased AE RL lung fields Abd soft--no tender even on deeper palpitaion No le edema  Scheduled Meds:  Chlorhexidine  Gluconate Cloth  6 each Topical Daily   enoxaparin (LOVENOX) injection  40 mg Subcutaneous Q24H   ferrous sulfate   325 mg Oral Q breakfast   midodrine   10 mg Oral TID WC   pravastatin   20 mg Oral QPM   sodium chloride  flush  3 mL Intravenous Q12H   sodium chloride  flush  3 mL Intravenous Q12H   Continuous Infusions:  piperacillin-tazobactam (ZOSYN)  IV 3.375 g (07/14/24 0635)   vancomycin  1,250 mg (07/14/24 0457)   acetaminophen  **OR** acetaminophen , HYDROcodone-acetaminophen , morphine  injection, ondansetron  **OR** ondansetron  (ZOFRAN ) IV, sodium chloride  flush  Colen Grimes, MD  [facility]

## 2024-07-14 NOTE — TOC Initial Note (Signed)
 Transition of Care [facility]) - Initial/Assessment Note    Patient Details  Name: Ronald Kaiser MRN: [id number] Date of Birth: [birth year]  [facility]) CM/SW Contact:    Marval Gell, RN Phone Number: 07/14/2024, 2:34 PM  Clinical Narrative:                  Patient admitted from home. Lives w spouse.  Sepsis, abd wall hematoma vs abscess. Patient active with [facility].  ICM will continue to follow for needs   Expected Discharge Plan: Home w Home Health Services Barriers to Discharge: Continued Medical Work up   Patient Goals and CMS Choice            Expected Discharge Plan and Services   Discharge Planning Services: CM Consult   Living arrangements for the past 2 months: Single Family Home                                      Prior Living Arrangements/Services Living arrangements for the past 2 months: Single Family Home Lives with:: Spouse                   Activities of Daily Living   ADL Screening (condition at time of admission) Independently performs ADLs?: Yes (appropriate for developmental age) Is the patient deaf or have difficulty hearing?: Yes Does the patient have difficulty seeing, even when wearing glasses/contacts?: Yes Does the patient have difficulty concentrating, remembering, or making decisions?: No  Permission Sought/Granted                  Emotional Assessment              Admission diagnosis:  Abdominal wall abscess [L02.211] Patient Active Problem List   Diagnosis Date Noted   Abdominal wall abscess 07/11/2024   History of CAD (coronary artery disease) 07/11/2024   HFrEF (heart failure with reduced ejection fraction) (HCC) 07/11/2024   Sepsis (HCC) 07/11/2024   Elevated troponin 07/11/2024   Iron  deficiency anemia, unspecified 06/11/2024   Dizziness 06/07/2024   Paroxysmal atrial fibrillation (HCC) 05/30/2024   Radiation proctitis 05/28/2024   Gastric erythema 05/28/2024   Rectal bleeding  05/27/2024   Iron  deficiency anemia 05/27/2024   Symptomatic anemia 05/26/2024   Prostate cancer metastatic to bone (HCC) 02/26/2021   Bladder outlet obstruction 11/27/2018   Acute kidney injury    Hypokalemia    Other hydronephrosis    Acute on chronic systolic CHF (congestive heart failure) (HCC) 11/11/2018   AKI (acute kidney injury) 11/11/2018   Basal cell carcinoma (BCC) of skin of nose 01/24/2018   Abnormal nuclear cardiac imaging test    Actinic keratoses 01/13/2016   CHF (congestive heart failure) (HCC) 01/13/2016   Male erectile disorder 01/13/2016   Osteoarthritis 01/13/2016   Seborrheic keratoses 01/13/2016   Bruit 11/23/2012   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 [address] [place] - [address] [place] [address] Phone: [phone] Fax: [phone]  [facility] - [place], [place] - [address] [place] [address] Phone: [phone] Fax: [phone]     Social Drivers of Health (SDOH) Social History: SDOH Screenings  Food Insecurity: Patient Declined (07/12/2024)  Housing: Unknown (07/12/2024)  Transportation Needs: Patient Declined (07/12/2024)  Utilities: Patient Declined (07/12/2024)  Social Connections: Patient Declined (07/12/2024)  Recent Concern: Social Connections - Moderately Isolated (06/07/2024)  Tobacco Use: Medium Risk (07/11/2024)   SDOH Interventions:     Readmission Risk Interventions    06/10/2024   10:37 AM  Readmission Risk Prevention Plan  Transportation Screening Complete  PCP or Specialist Appt within 3-5 Days Complete  HRI or Home Care Consult Complete  Palliative Care Screening Not Applicable  Medication Review (RN Care  Manager) Complete

## 2024-07-15 DIAGNOSIS — L02211 Cutaneous abscess of abdominal wall: Secondary | ICD-10-CM | POA: Diagnosis not present

## 2024-07-15 LAB — CULTURE, BLOOD (ROUTINE X 2)
Culture: NO GROWTH
Culture: NO GROWTH
Special Requests: ADEQUATE
Special Requests: ADEQUATE

## 2024-07-15 LAB — CBC WITH DIFFERENTIAL/PLATELET
Abs Immature Granulocytes: 0.05 K/uL (ref 0.00–0.07)
Basophils Absolute: 0.1 K/uL (ref 0.0–0.1)
Basophils Relative: 1 %
Eosinophils Absolute: 0.1 K/uL (ref 0.0–0.5)
Eosinophils Relative: 1 %
HCT: 29 % — ABNORMAL LOW (ref 39.0–52.0)
Hemoglobin: 9.2 g/dL — ABNORMAL LOW (ref 13.0–17.0)
Immature Granulocytes: 1 %
Lymphocytes Relative: 12 %
Lymphs Abs: 1.1 K/uL (ref 0.7–4.0)
MCH: 29.5 pg (ref 26.0–34.0)
MCHC: 31.7 g/dL (ref 30.0–36.0)
MCV: 92.9 fL (ref 80.0–100.0)
Monocytes Absolute: 0.4 K/uL (ref 0.1–1.0)
Monocytes Relative: 5 %
Neutro Abs: 7.5 K/uL (ref 1.7–7.7)
Neutrophils Relative %: 80 %
Platelets: 306 K/uL (ref 150–400)
RBC: 3.12 MIL/uL — ABNORMAL LOW (ref 4.22–5.81)
RDW: 18.8 % — ABNORMAL HIGH (ref 11.5–15.5)
WBC: 9.2 K/uL (ref 4.0–10.5)
nRBC: 0 % (ref 0.0–0.2)

## 2024-07-15 LAB — BASIC METABOLIC PANEL WITH GFR
Anion gap: 11 (ref 5–15)
BUN: 15 mg/dL (ref 8–23)
CO2: 25 mmol/L (ref 22–32)
Calcium: 7.9 mg/dL — ABNORMAL LOW (ref 8.9–10.3)
Chloride: 98 mmol/L (ref 98–111)
Creatinine, Ser: 0.77 mg/dL (ref 0.61–1.24)
GFR, Estimated: >60 mL/min (ref >60)
Glucose, Bld: 109 mg/dL — ABNORMAL HIGH (ref 70–99)
Potassium: 3.7 mmol/L (ref 3.5–5.1)
Sodium: 134 mmol/L — ABNORMAL LOW (ref 135–145)

## 2024-07-15 LAB — HEPATIC FUNCTION PANEL
ALT: 13 U/L (ref 0–44)
AST: 17 U/L (ref 15–41)
Albumin: 1.5 g/dL — ABNORMAL LOW (ref 3.5–5.0)
Alkaline Phosphatase: 52 U/L (ref 38–126)
Bilirubin, Direct: <0.1 mg/dL (ref 0.0–0.2)
Total Bilirubin: 0.4 mg/dL (ref 0.0–1.2)
Total Protein: 6.1 g/dL — ABNORMAL LOW (ref 6.5–8.1)

## 2024-07-15 MED ORDER — SODIUM CHLORIDE 0.9 % IV SOLN
2.0000 g | INTRAVENOUS | Status: DC
  Administered 2024-07-15 – 2024-07-23 (×9): 2 g via INTRAVENOUS
  Filled 2024-07-15 (×9): qty 20

## 2024-07-15 NOTE — Care Management Important Message (Signed)
 Important Message  Patient Details  Name: Ronald Kaiser MRN: [id number] Date of Birth: 1934-09-19   Important Message Given:  Yes - Medicare IM     Claretta Deed 07/15/2024, 4:01 PM

## 2024-07-15 NOTE — Plan of Care (Signed)

## 2024-07-15 NOTE — Progress Notes (Signed)
 Subjective/Chief Complaint: Pt s/p aspiration of muscular fluid collection by IR 10/10. Cx w. Proteus mirabilus, same as urine cultures.  Patient reports less abdominal wall pain compared to prior. He is tolerating PO. He wants to get OOB and walk. He wants to go home but understands that weakness, recurrent falls may be an issue/barrier.   Objective: Vital signs in last 24 hours: Temp:  [97.3 F (36.3 C)-98.2 F (36.8 C)] 97.5 F (36.4 C) (10/13 0751) Pulse Rate:  [72-87] 86 (10/13 0751) Resp:  [19-22] 20 (10/13 0751) BP: (107-119)/(48-63) 108/59 (10/13 0751) SpO2:  [93 %-95 %] 93 % (10/13 0751) Last BM Date : 07/13/24  Intake/Output from previous day: 10/12 0701 - 10/13 0700 In: 1593 [P.O.:240; I.V.:3; IV Piggyback:1350] Out: 900 [Urine:900] Intake/Output this shift: No intake/output data recorded.  PE:  Constitutional: No acute distress, conversant, appears states age. Lungs: normal respiratory effort CV: regular rate and rhythm GI: Soft, LLQ aspiration site c/d/I with bandaid. Suprapubic firmness present but improved compared to prior. There is still faint erythema of the abdominal wall that is also improved compared to prior. no masses or hepatosplenomegaly Skin: No rashes, palpation reveals normal turgor Psychiatric: appropriate judgment and insight, oriented to person, place, and time   Lab Results:  Recent Labs    07/14/24 0516 07/15/24 0418  WBC 11.5* 9.2  HGB 9.1* 9.2*  HCT 29.0* 29.0*  PLT 328 306   BMET Recent Labs    07/14/24 0516 07/15/24 0418  NA 137 134*  K 3.4* 3.7  CL 98 98  CO2 25 25  GLUCOSE 159* 109*  BUN 14 15  CREATININE 0.90 0.77  CALCIUM  7.7* 7.9*   PT/INR No results for input(s): LABPROT, INR in the last 72 hours. Studies/Results: No results found.   Anti-infectives: Anti-infectives (From admission, onward)    Start     Dose/Rate Route Frequency Ordered Stop   07/12/24 0000  vancomycin  (VANCOREADY) IVPB 1250  mg/250 mL        1,250 mg 166.7 mL/hr over 90 Minutes Intravenous Every 24 hours 07/11/24 0057     07/11/24 0600  piperacillin-tazobactam (ZOSYN) IVPB 3.375 g        3.375 g 12.5 mL/hr over 240 Minutes Intravenous Every 8 hours 07/11/24 0057     07/11/24 0000  metroNIDAZOLE (FLAGYL) IVPB 500 mg        500 mg 100 mL/hr over 60 Minutes Intravenous  Once 07/10/24 2348 07/11/24 0626   07/11/24 0000  vancomycin  (VANCOREADY) IVPB 1500 mg/300 mL        1,500 mg 150 mL/hr over 120 Minutes Intravenous  Once 07/10/24 2353 07/11/24 0512   07/10/24 2315  ceFEPIme  (MAXIPIME ) 2 g in sodium chloride  0.9 % 100 mL IVPB        2 g 200 mL/hr over 30 Minutes Intravenous  Once 07/10/24 2304 07/11/24 0219       Assessment/Plan: Infected abdominal wall hematoma versus abscess -Appreciate interventional radiology aspirating - patient is clinically improving but I do have concerns about this abdominal collection being incompletely drained. I still think that avoiding surgical drainage is in his best interest given his MMP and cardiac history, we will continue to follow his abdominal exam over the next couple of days and make recommendations. will have to consider the risks of anesthesia if he does require surgical drainage    FEN: reg IVF per primary VTE: Lovenox ID: Zosyn, Vanc; Cx with proteus, abx per primary team   Metastatic prostate cancer  with signs of progressive disease in the pelvis Acute on chronic CHF with reduced EF Chronic hypotension on midodrine  A-fib not on anticoagulation CAD    LOS: 1 day    I reviewed nursing notes, hospitalist notes, last 24 h vitals and pain scores, last 48 h intake and output, last 24 h labs and trends, and last 24 h imaging results.   This care required moderate level of medical decision making.   LOS: 4 days    Almarie GORMAN Pringle 07/15/2024

## 2024-07-15 NOTE — Progress Notes (Signed)
 TR[facility]   ROUNDING   NOTE Ronald Kaiser FMW:[id number]  DOB: September 28, 1934  DOA: 07/10/2024  PCP: [name], [name]  07/15/2024,2:50 PM  LOS: 4 days    Code Status:  full     From:  home    89 year old white male HFrEF 25-30% Metastatic lesions from prostatic cancer on Xtandi  managed previously by Dr. Cam Dr. Patrcia high if and has had radiation proctitis with anemia previously and underwent colonoscopy 05/28/2024 showing severe radiation proctitis with APC prohibiting full colonoscopy-at the time was given Carafate and instructed to follow-up as an outpatient Permanent A-fib not on anticoagulation complicated by severe orthostasis precluding rate control and not a good candidate for anticoagulation because of elevated has bled score HLD Previous channel TURP 03/01/2019 Dr. Cam   Hospitalized 9/4 through 06/10/2024 after a fall hitting the back of the counter was hypotensive went to the ICU had pressors and was placed on midodrine  that admission septic shock 2/2 UTI negative cultures completed 7 days cefadroxil  total at that admission also acute traumatic T11, chronic T12 and 3rd and 4th rib fractures which were managed conservatively per orthopedics with TLSO found to have left rectal wall hematoma as well General Surgery did not feel any role for intervention at the time     Patient followed up with nurse practitioner 9/12 after hospitalization he was taking aspirin  325 scheduled for iron  infusion    9/23 followed with cardiology-they document difficulty understanding what meds he takes and they decided to workup syncope and a 2-week live monitor was ordered but he was not felt to be an ICD candidate and GDMT was limited by his hypotension and they hinted at maybe having a goals of care discussion   10/8 brought to ED with lower abdominal pain radiating down to his legs over the past 7 to 10 days improves when he does not move or do any exercises associated with shortness of breath  occasionally--- he was found to have rectus sheath hematoma/abscess/other on the CT angio as dictated below for this indication because of severe pain  Labs on admit Sodium 133 potassium 4.2 BUN/creatinine 41/1.0 troponin 82 lactic acid 3.1 WBC 22 hemoglobin 8 platelet 360 INR 1.2 Blood culture X2 obtained Urine analysis negative Echocardiogram EF 25-30%   Imaging CXR 1 view shallow inspiration emphysematous changes fibrosis bronchitic changes linear atelectasis no focal consolidation sclerotic bone lesions known metastatic disease CT angio abdominal chest pelvis = increasing soft tissue mass infiltration low pelvis involving prostate bed extending to left pelvic sidewall and into proximal left adductor muscles?  Residual recurrent tumor versus posttreatment changes/possible inflammatory process-diffuse bladder wall thickening 2/2 Boop/posttreatment changes + calcification bladder prostatic region-bilateral hydronephrosis and hydroureter related to reflux or bladder outlet obstruction Low anterior rectus abdominis collection with surrounding stranding secondary to abscess? Cortical erosion symphysis pubis this?  Osteomyelitis radiation changes Multiple sclerotic bony lesions?  Metastatic disease  10/13 MRI pelvis is still pending   Chronology  Transfused 2 units PRBC since admission With above constellation of findings General Surgery was consulted who felt IR should attempt to drain the area 10/10 IR drainage 30 cc body wall fluid collection sent for culture inclusive of cytology 10/10 cardiology consulted for perioperative clearance in the setting of abscess requiring possible surgery   oncology consulted to give opinion regarding prostate cancer 10/13 grade on suggesting MRI pelvis--- curb sided urologist Mr. Ole who feels that this needs to be followed up in the outpatient setting if any and suggests outpatient  medical oncology follow-up    Assessment  & Plan :    Intra-abdominal  abscess/infected rectus sheath hematoma Cultures growing relatively pansensitive Proteus Vancomycin  and Zosyn since 10/8-->ceftriaxone  10/13 Duration dependent on imaging/general surgery input-overall clinical condition/pain have improved feel that this was a superficial hematoma without deep extension Follow MRI as per above suggestion by radiation oncology team-risk of osteoradionecrosis of pubic symphysis versus metastatic disease from prostate primary Anemia acute blood loss?  Secondary to hematoma Transfusions as above---Hemoglobin has maintained stability Continue p.o. iron  325 Metastatic prostate cancer XRT resection-Gleason 4+4 initially treated 2003- ADT X 3 years Dr. Francella Ripley  2019 bone scan restaging showed right sixth rib- TURP 03/01/2019 recurrent prostate cancer involving 95% resected tissue with increase in PSA to 6.55 in 2020 Repeat TURP 08/19/2020 He had a PET scan last 02/01/2021 and underwent XRT as above Defer to oncology/radiation oncolog?  Rx castrate resistant cancer-verbal report from Dr. Timmy indicates that there may be some treatment options Cytology from the biopsy .  Testosterone  level PSA performed earlier during hospital stay are normal MRI as above discussion Atrial fibrillation CHADVASC >4 not on anticoagulation has bled score >4 Hypotension related cardiomyopathy CAD chronic HFrEF Continues midodrine  to 10 3 times daily  No GDMT  at this time-is not volume overloaded-resume/statin Lasix  as able at discharge Previous radiation proctitis with GI bleed Poor  candidacy of anticoagulation or even aspirin  at this time Would not resume aspirin  (hematoma) and needs follow-up in the outpatient to discuss this Previous fall with multiple fractures  Data Reviewed:   WBC 9.7 hemoglobin 8.8 platelet 332 Sodium 135 potassium 3.6 BUN/creatinine 12/0.7  DVT prophylaxis: Lovenox  Status is: Inpatient Remains inpatient appropriate because:   Await  hemodynamic stability/imaging prior to planning for discharge per general surgery    Dispo: Inpatient   25 minutes    Subjective:   Looks well feels fair no distress has not really been up and out of bed No chest pain no fever  Objective + exam Vitals:   07/15/24 0400 07/15/24 0751 07/15/24 0800 07/15/24 1145  BP: 107/60 (!) 108/59 (!) 111/59 (!) 103/55  Pulse: 87 86 86 75  Resp: (!) 21 20 (!) 23 16  Temp: 98.2 F (36.8 C) (!) 97.5 F (36.4 C)    TempSrc: Oral Oral    SpO2: 93% 93% 92% 92%  Weight:      Height:       Filed Weights   07/10/24 1755 07/11/24 2013  Weight: 64 kg 67 kg   Examination:  EOMI NCAT no focal deficit no icterus no pallor Overall condition seems improved ROM intact Abdomen is soft no rebound some slight discomfort and tenderness with bandage over area but much less tender than prior No lower extremity edema Chest is clear   Scheduled Meds:  Chlorhexidine  Gluconate Cloth  6 each Topical Daily   enoxaparin (LOVENOX) injection  40 mg Subcutaneous Q24H   ferrous sulfate   325 mg Oral Q breakfast   midodrine   10 mg Oral TID WC   pravastatin   20 mg Oral QPM   sodium chloride  flush  3 mL Intravenous Q12H   sodium chloride  flush  3 mL Intravenous Q12H   Continuous Infusions:  cefTRIAXone  (ROCEPHIN )  IV 2 g (07/15/24 1314)   acetaminophen  **OR** acetaminophen , HYDROcodone-acetaminophen , morphine  injection, ondansetron  **OR** ondansetron  (ZOFRAN ) IV, sodium chloride  flush  Colen Grimes, MD  [facility]

## 2024-07-15 NOTE — Progress Notes (Signed)
 Patient ambulated outside the room using front wheel walker. Noted to have mild dizziness when getting up but was resolved right away. No SOB while walking.   Patient up in chair after walking.

## 2024-07-15 NOTE — Progress Notes (Addendum)
  Radiation Oncology         [phone]) [phone] ________________________________  Name: Ronald Kaiser MRN: [id number]  Date: 06/06/2024  DOB: 05/08/1934  Chart Note:  I was contacted by the oncology team regarding this patient, who has a history of Stage IV metastatic prostate adenocarcinoma under the care of Dr. Cam and currently treated with enzalutamide  (Xtandi ).  He received pelvic/prostate radiotherapy in 2003 ([place] ), salvage SBRT to the left seminal vesicle and T12 in 2022, and most recently SBRT to a left pubic ramus oligometastasis in 2023.  He is now admitted with lower abdominal pain and imaging demonstrating a rectus sheath abscess communicating with an erosion of the left pubic symphysis, with possible posterior extension toward the bladder.  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 Differentiation from osteitis pubis or metastatic disease is critical, as management differs.  Key considerations for the inpatient team:  Obtain MRI pelvis (if not contraindicated) or contrast-enhanced CT to evaluate the extent of bone and soft-tissue involvement.  Culture-directed antibiotics and surgical drainage as clinically indicated for abscess management.  Consider infectious disease and orthopedic/urology consultation for coordinated care.  If ORN is suspected or confirmed, adjunctive hyperbaric oxygen therapy (HBO) and/or medical management with pentoxifylline + tocopherol  clodronate (per "PENTOCLO" protocol) may be beneficial in select cases.  Multidisciplinary discussion recommended to balance infection control, pain relief, and local tissue preservation.  I am available for further review of the radiation history, dose distribution, and potential contribution to current findings as  needed.   ------------------------------------------------   Donnice Barge, MD [facility]  Radiation Oncology Direct Dial: [phone]  Fax: [phone] [email or web address]  Skype  LinkedIn

## 2024-07-16 ENCOUNTER — Inpatient Hospital Stay (HOSPITAL_COMMUNITY)

## 2024-07-16 DIAGNOSIS — L02211 Cutaneous abscess of abdominal wall: Secondary | ICD-10-CM | POA: Diagnosis not present

## 2024-07-16 LAB — CBC WITH DIFFERENTIAL/PLATELET
Abs Immature Granulocytes: 0.03 K/uL (ref 0.00–0.07)
Basophils Absolute: 0.1 K/uL (ref 0.0–0.1)
Basophils Relative: 1 %
Eosinophils Absolute: 0.1 K/uL (ref 0.0–0.5)
Eosinophils Relative: 1 %
HCT: 28 % — ABNORMAL LOW (ref 39.0–52.0)
Hemoglobin: 8.8 g/dL — ABNORMAL LOW (ref 13.0–17.0)
Immature Granulocytes: 0 %
Lymphocytes Relative: 12 %
Lymphs Abs: 1.2 K/uL (ref 0.7–4.0)
MCH: 29.4 pg (ref 26.0–34.0)
MCHC: 31.4 g/dL (ref 30.0–36.0)
MCV: 93.6 fL (ref 80.0–100.0)
Monocytes Absolute: 0.5 K/uL (ref 0.1–1.0)
Monocytes Relative: 5 %
Neutro Abs: 7.8 K/uL — ABNORMAL HIGH (ref 1.7–7.7)
Neutrophils Relative %: 81 %
Platelets: 332 K/uL (ref 150–400)
RBC: 2.99 MIL/uL — ABNORMAL LOW (ref 4.22–5.81)
RDW: 18.7 % — ABNORMAL HIGH (ref 11.5–15.5)
WBC: 9.7 K/uL (ref 4.0–10.5)
nRBC: 0 % (ref 0.0–0.2)

## 2024-07-16 LAB — BASIC METABOLIC PANEL WITH GFR
Anion gap: 10 (ref 5–15)
BUN: 12 mg/dL (ref 8–23)
CO2: 25 mmol/L (ref 22–32)
Calcium: 7.8 mg/dL — ABNORMAL LOW (ref 8.9–10.3)
Chloride: 100 mmol/L (ref 98–111)
Creatinine, Ser: 0.71 mg/dL (ref 0.61–1.24)
GFR, Estimated: >60 mL/min (ref >60)
Glucose, Bld: 107 mg/dL — ABNORMAL HIGH (ref 70–99)
Potassium: 3.6 mmol/L (ref 3.5–5.1)
Sodium: 135 mmol/L (ref 135–145)

## 2024-07-16 LAB — CYTOLOGY - NON PAP

## 2024-07-16 MED ORDER — GADOBUTROL 1 MMOL/ML IV SOLN
6.5000 mL | Freq: Once | INTRAVENOUS | Status: AC | PRN
  Administered 2024-07-16: 6.5 mL via INTRAVENOUS

## 2024-07-16 NOTE — Progress Notes (Signed)
 Rn noticed foley leaking from insertion site. No pain or discomfort. Foley was adjusted as needed. Still leaking as seen. Care team will be made aware. Output of 1200 recorded, patient soiled blankets and gown completely, unknown amount recorded.

## 2024-07-16 NOTE — Plan of Care (Signed)
   Problem: Health Behavior/Discharge Planning: Goal: Ability to manage health-related needs will improve Outcome: Progressing

## 2024-07-16 NOTE — Progress Notes (Signed)
 I find it very interesting that he has a Proteus UTI.  He also has Proteus growing out of this abscess in his abdomen.  I think, he may have some type of connection between the bladder and this abscess.  He tells me that Dr. Nieves is his urologist.  I would have to think that it would be very helpful to get Urology to see him and to see if he needs to have a cystoscopy.  I talked to Dr. Patrcia of Radiation Oncology yesterday.  He did provide a very comprehensive note as to what he thinks might be going on.  I am sure that Ronald Kaiser has metastatic prostate cancer.  It is hard to say how extensive this is.  His PSA is only 3.55.  I am not sure what his prior PSAs were.  He says he has not been on Xtandi  for a month.  He says that he will not take any Lupron.  He is on oral iron .  His CBC shows white count 9.7.  Hemoglobin 8.8.  Platelet count 332,000.   Sodium 135.  Potassium 3.6.  BUN 12 creatinine 0.71.  Calcium  7.8 with an albumin of 1.5.  Again, I think that we need to get Urology involved.  I hope that he does not have any kind of fistula with the bladder and this abscess.  At the present time, I am unsure if his prostate cancer really is much of a acute issue.  Jeralyn Crease, MD  Romans 1:16

## 2024-07-16 NOTE — Progress Notes (Signed)
 Subjective/Chief Complaint: Continues to feel better in terms of abdominal discomfort.   Objective: Vital signs in last 24 hours: Temp:  [97.9 F (36.6 C)-98.1 F (36.7 C)] 98 F (36.7 C) (10/14 0725) Pulse Rate:  [68-95] 83 (10/14 0725) Resp:  [16-28] 22 (10/14 0725) BP: (103-120)/(55-66) 116/56 (10/14 0725) SpO2:  [91 %-98 %] 96 % (10/14 0725) Last BM Date : 07/16/24  Intake/Output from previous day: 10/13 0701 - 10/14 0700 In: 300 [P.O.:200; IV Piggyback:100] Out: 3400 [Urine:3400] Intake/Output this shift: No intake/output data recorded.  PE:  Constitutional: No acute distress, conversant, appears states age. Lungs: normal respiratory effort CV: regular rate and rhythm GI: Soft, LLQ aspiration site c/d/I with bandaid. Suprapubic firmness present but improved compared to prior. Abdominal wall erythema appears resolved. no masses or hepatosplenomegaly Skin: No rashes, palpation reveals normal turgor Psychiatric: appropriate judgment and insight, oriented to person, place, and time   Lab Results:  Recent Labs    07/15/24 0418 07/16/24 0258  WBC 9.2 9.7  HGB 9.2* 8.8*  HCT 29.0* 28.0*  PLT 306 332   BMET Recent Labs    07/15/24 0418 07/16/24 0258  NA 134* 135  K 3.7 3.6  CL 98 100  CO2 25 25  GLUCOSE 109* 107*  BUN 15 12  CREATININE 0.77 0.71  CALCIUM  7.9* 7.8*   PT/INR No results for input(s): LABPROT, INR in the last 72 hours. Studies/Results: No results found.   Anti-infectives: Anti-infectives (From admission, onward)    Start     Dose/Rate Route Frequency Ordered Stop   07/15/24 1300  cefTRIAXone  (ROCEPHIN ) 2 g in sodium chloride  0.9 % 100 mL IVPB        2 g 200 mL/hr over 30 Minutes Intravenous Every 24 hours 07/15/24 1214     07/12/24 0000  vancomycin  (VANCOREADY) IVPB 1250 mg/250 mL  Status:  Discontinued        1,250 mg 166.7 mL/hr over 90 Minutes Intravenous Every 24 hours 07/11/24 0057 07/15/24 1214   07/11/24 0600   piperacillin-tazobactam (ZOSYN) IVPB 3.375 g  Status:  Discontinued        3.375 g 12.5 mL/hr over 240 Minutes Intravenous Every 8 hours 07/11/24 0057 07/15/24 1214   07/11/24 0000  metroNIDAZOLE (FLAGYL) IVPB 500 mg        500 mg 100 mL/hr over 60 Minutes Intravenous  Once 07/10/24 2348 07/11/24 0626   07/11/24 0000  vancomycin  (VANCOREADY) IVPB 1500 mg/300 mL        1,500 mg 150 mL/hr over 120 Minutes Intravenous  Once 07/10/24 2353 07/11/24 0512   07/10/24 2315  ceFEPIme  (MAXIPIME ) 2 g in sodium chloride  0.9 % 100 mL IVPB        2 g 200 mL/hr over 30 Minutes Intravenous  Once 07/10/24 2304 07/11/24 0219       Assessment/Plan: Infected abdominal wall hematoma versus abscess -Appreciate interventional radiology aspirating - patient is clinically improving s/p aspiration on antibiotics. Palpable firmness of the abdominal wall is still present but erythema is gone, will monitor.  - reviewed notes from Dr. Timmy and rad onc regarding concerns for abdominal wall collection communicating with pelvic process.     FEN: reg IVF per primary VTE: Lovenox ID: Zosyn, Vanc; Cx with proteus, abx per primary team   Metastatic prostate cancer with signs of progressive disease in the pelvis Acute on chronic CHF with reduced EF Chronic hypotension on midodrine  A-fib not on anticoagulation CAD    LOS: 1 day  I reviewed nursing notes, hospitalist notes, last 24 h vitals and pain scores, last 48 h intake and output, last 24 h labs and trends, and last 24 h imaging results.   This care required moderate level of medical decision making.   LOS: 5 days    Ronald Kaiser 07/16/2024

## 2024-07-16 NOTE — Progress Notes (Signed)
 THIs was 10/13 noe   TRH   ROUNDING   NOTE HARM JOU FMW:[id number]  DOB: Feb 17, 1934  DOA: 07/10/2024  PCP: [name], [name]  07/15/2024,2:50 PM  LOS: 4 days    Code Status:  full     From:  home      89 year old white male HFrEF 25-30% Metastatic lesions from prostatic cancer on Xtandi  managed previously by Dr. Cam Dr. Patrcia high if and has had radiation proctitis with anemia previously and underwent colonoscopy 05/28/2024 showing severe radiation proctitis with APC prohibiting full colonoscopy-at the time was given Carafate and instructed to follow-up as an outpatient Permanent A-fib not on anticoagulation complicated by severe orthostasis precluding rate control and not a good candidate for anticoagulation because of elevated has bled score HLD Previous channel TURP 03/01/2019 Dr. Cam   Hospitalized 9/4 through 06/10/2024 after a fall hitting the back of the counter was hypotensive went to the ICU had pressors and was placed on midodrine  that admission septic shock 2/2 UTI negative cultures completed 7 days cefadroxil  total at that admission also acute traumatic T11, chronic T12 and 3rd and 4th rib fractures which were managed conservatively per orthopedics with TLSO found to have left rectal wall hematoma as well General Surgery did not feel any role for intervention at the time     Patient followed up with nurse practitioner 9/12 after hospitalization he was taking aspirin  325 scheduled for iron  infusion    9/23 followed with cardiology-they document difficulty understanding what meds he takes and they decided to workup syncope and a 2-week live monitor was ordered but he was not felt to be an ICD candidate and GDMT was limited by his hypotension and they hinted at maybe having a goals of care discussion   10/8 brought to ED with lower abdominal pain radiating down to his legs over the past 7 to 10 days improves when he does not move or do any exercises associated with  shortness of breath occasionally--- he was found to have rectus sheath hematoma/abscess/other on the CT angio as dictated below for this indication because of severe pain   Labs on admit Sodium 133 potassium 4.2 BUN/creatinine 41/1.0 troponin 82 lactic acid 3.1 WBC 22 hemoglobin 8 platelet 360 INR 1.2 Blood culture X2 obtained Urine analysis negative Echocardiogram EF 25-30%   Imaging CXR 1 view shallow inspiration emphysematous changes fibrosis bronchitic changes linear atelectasis no focal consolidation sclerotic bone lesions known metastatic disease CT angio abdominal chest pelvis = increasing soft tissue mass infiltration low pelvis involving prostate bed extending to left pelvic sidewall and into proximal left adductor muscles?  Residual recurrent tumor versus posttreatment changes/possible inflammatory process-diffuse bladder wall thickening 2/2 Boop/posttreatment changes + calcification bladder prostatic region-bilateral hydronephrosis and hydroureter related to reflux or bladder outlet obstruction Low anterior rectus abdominis collection with surrounding stranding secondary to abscess? Cortical erosion symphysis pubis this?  Osteomyelitis radiation changes Multiple sclerotic bony lesions?  Metastatic disease   Chronology  Transfused 2 units PRBC since admission With above constellation of findings General Surgery was consulted who felt IR should attempt to drain the area 10/10 IR drainage 30 cc body wall fluid collection sent for culture inclusive of cytology 10/10 neurology consulted for perioperative clearance in the setting of abscess requiring possible surgery    Assessment  & Plan :      Intra-abdominal abscess/infected rectus sheath hematoma Cultures growing relatively pansensitive Proteus Continues vancomycin  and Zosyn--narrowed to ceftriaxone  as no clear operative plan but watchful waiting  Imaging to be repeated as per general surgery Has not been needing much in terms of  pain control-pain regimen as written Anemia acute blood loss?  Secondary to hematoma Transfusions as above Hemoglobin has maintained stability Continue p.o. iron  325 Metastatic prostate cancer XRT resection-Gleason 4+4 initially treated 2003- ADT X 3 years Dr. Francella [place]  2019 bone scan restaging showed right sixth rib- TURP 03/01/2019 recurrent prostate cancer involving 95% resected tissue with increase in PSA to 6.55 in 2020 Repeat TURP 08/19/2020 He had a PET scan last 02/01/2021 and underwent XRT as above Defer to oncology/radiation oncolog?  Rx castrate resistant cancer-verbal report from Dr. Timmy indicates that there may be some treatment options?--ccDr. Patrcia with Rad-onc Cytology from the biopsy performed was ordered and hopefully will be added on Atrial fibrillation CHADVASC >4 not on anticoagulation has bled score >4 Hypotension related cardiomyopathy CAD chronic HFrEF Continues midodrine  to 10 3 times daily  No GDMT f at this time-is not volume overloaded-his statin from admission has been held, I have held his Lasix  from prior to admission additionally Previous radiation proctitis with GI bleed Poor  candidacy of anticoagulation or even aspirin  at this time Would not resume aspirin  and needs follow-up in the outpatient to discuss this Previous fall with multiple fractures   Data Reviewed:    WBC 9.2 hemoglobin 9.2 platelet 306 Sodium 134 potassium 3.7 BUN/creatinine 15/0.7 LFTs are normal   DVT prophylaxis: Lovenox   Status is: Inpatient Remains inpatient appropriate because:    Await hemodynamic stability/imaging prior to planning for discharge per general surgery     Dispo: Inpatient          25 minutes Care coordination with oncology and radiation oncology    Subjective:    Looks feels fair No pain Eating drinking Has not been out of bed yet nursing aware to mobilize     Objective + exam       Vitals:    07/15/24 0400 07/15/24 0751  07/15/24 0800 07/15/24 1145  BP: 107/60 (!) 108/59 (!) 111/59 (!) 103/55  Pulse: 87 86 86 75  Resp: (!) 21 20 (!) 23 16  Temp: 98.2 F (36.8 C) (!) 97.5 F (36.4 C)      TempSrc: Oral Oral      SpO2: 93% 93% 92% 92%  Weight:          Height:                Filed Weights    07/10/24 1755 07/11/24 2013  Weight: 64 kg 67 kg    Examination: Awake cohenet in nad Mildy decreased AE RL lung fields Abd soft--no tender even on deeper palpitaion-continues to improve No le edema   Scheduled Meds:  Chlorhexidine  Gluconate Cloth  6 each Topical Daily   enoxaparin (LOVENOX) injection  40 mg Subcutaneous Q24H   ferrous sulfate   325 mg Oral Q breakfast   midodrine   10 mg Oral TID WC   pravastatin   20 mg Oral QPM   sodium chloride  flush  3 mL Intravenous Q12H   sodium chloride  flush  3 mL Intravenous Q12H        Continuous Infusions:  cefTRIAXone  (ROCEPHIN )  IV 2 g (07/15/24 1314)        acetaminophen  **OR** acetaminophen , HYDROcodone-acetaminophen , morphine  injection, ondansetron  **OR** ondansetron  (ZOFRAN ) IV, sodium chloride  flush       Colen Grimes, MD  [facility]

## 2024-07-16 NOTE — Plan of Care (Signed)

## 2024-07-16 NOTE — TOC Progression Note (Signed)
 Transition of Care [facility]) - Progression Note    Patient Details  Name: Ronald Kaiser MRN: [id number] Date of Birth: December 29, 1933  Transition of Care [facility] CM/SW Contact  Marval Gell, RN Phone Number: 07/16/2024, 9:57 AM  Clinical Narrative:     Confirmed HH services w Meredith Sermon w attending, added in [facility] for PT and RN to resume care at [facility]. Updated AVS   Expected Discharge Plan: Home w Home Health Services Barriers to Discharge: Continued Medical Work up               Expected Discharge Plan and Services   Discharge Planning Services: CM Consult   Living arrangements for the past 2 months: Single Family Home                                       Social Drivers of Health (SDOH) Interventions SDOH Screenings   Food Insecurity: Patient Declined (07/12/2024)  Housing: Unknown (07/12/2024)  Transportation Needs: Patient Declined (07/12/2024)  Utilities: Patient Declined (07/12/2024)  Social Connections: Patient Declined (07/12/2024)  Recent Concern: Social Connections - Moderately Isolated (06/07/2024)  Tobacco Use: Medium Risk (07/11/2024)    Readmission Risk Interventions    06/10/2024   10:37 AM  Readmission Risk Prevention Plan  Transportation Screening Complete  PCP or Specialist Appt within 3-5 Days Complete  HRI or Home Care Consult Complete  Palliative Care Screening Not Applicable  Medication Review (RN Care Manager) Complete

## 2024-07-17 ENCOUNTER — Inpatient Hospital Stay (HOSPITAL_COMMUNITY)

## 2024-07-17 DIAGNOSIS — L02211 Cutaneous abscess of abdominal wall: Secondary | ICD-10-CM | POA: Diagnosis not present

## 2024-07-17 LAB — BASIC METABOLIC PANEL WITH GFR
Anion gap: 8 (ref 5–15)
BUN: 11 mg/dL (ref 8–23)
CO2: 24 mmol/L (ref 22–32)
Calcium: 7.8 mg/dL — ABNORMAL LOW (ref 8.9–10.3)
Chloride: 100 mmol/L (ref 98–111)
Creatinine, Ser: 0.67 mg/dL (ref 0.61–1.24)
GFR, Estimated: >60 mL/min (ref >60)
Glucose, Bld: 107 mg/dL — ABNORMAL HIGH (ref 70–99)
Potassium: 3.7 mmol/L (ref 3.5–5.1)
Sodium: 132 mmol/L — ABNORMAL LOW (ref 135–145)

## 2024-07-17 LAB — CBC WITH DIFFERENTIAL/PLATELET
Abs Immature Granulocytes: 0.03 K/uL (ref 0.00–0.07)
Basophils Absolute: 0.1 K/uL (ref 0.0–0.1)
Basophils Relative: 1 %
Eosinophils Absolute: 0 K/uL (ref 0.0–0.5)
Eosinophils Relative: 0 %
HCT: 27.3 % — ABNORMAL LOW (ref 39.0–52.0)
Hemoglobin: 8.8 g/dL — ABNORMAL LOW (ref 13.0–17.0)
Immature Granulocytes: 0 %
Lymphocytes Relative: 12 %
Lymphs Abs: 1.1 K/uL (ref 0.7–4.0)
MCH: 30 pg (ref 26.0–34.0)
MCHC: 32.2 g/dL (ref 30.0–36.0)
MCV: 93.2 fL (ref 80.0–100.0)
Monocytes Absolute: 0.4 K/uL (ref 0.1–1.0)
Monocytes Relative: 5 %
Neutro Abs: 7.4 K/uL (ref 1.7–7.7)
Neutrophils Relative %: 82 %
Platelets: 294 K/uL (ref 150–400)
RBC: 2.93 MIL/uL — ABNORMAL LOW (ref 4.22–5.81)
RDW: 18.8 % — ABNORMAL HIGH (ref 11.5–15.5)
WBC: 9 K/uL (ref 4.0–10.5)
nRBC: 0 % (ref 0.0–0.2)

## 2024-07-17 LAB — AEROBIC/ANAEROBIC CULTURE W GRAM STAIN (SURGICAL/DEEP WOUND)

## 2024-07-17 MED ORDER — IOHEXOL 300 MG/ML  SOLN
50.0000 mL | Freq: Once | INTRAMUSCULAR | Status: AC | PRN
  Administered 2024-07-17: 50 mL

## 2024-07-17 MED ORDER — IOHEXOL 300 MG/ML  SOLN: 50.0000 mL | Freq: Once | INTRAMUSCULAR | Status: DC | PRN

## 2024-07-17 NOTE — Consult Note (Addendum)
 Urology Consult Note   Requesting Attending Physician:  Sherlon Brayton RAMAN, MD Service Providing Consult: Urology  Consulting Attending: Dr. Cam   Reason for Consult: Cystitis with abutting abdominal fluid collection  HPI: Ronald Kaiser is seen in consultation for reasons noted above at the request of Elgergawy, Brayton RAMAN, MD. Patient is a 89 y.o. male presenting to [facility] emergency department with complaint of lower abdominal pain and radiating left anterior leg pain x 1 month.  PMH significant for CAD-s/p cardiac stent placement, paroxysmal A-fib, HTN, HLD, metastatic prostate cancer-s/p radiation therapy 2X, ischemic cardiomyopathy with reduced EF-30 to 35%, and hypotension.  Patient developed a rectus sheath hematoma after a fall at home.   This was diagnosed during his 06/08/2024 admission.  On imaging there was again a large fluid collection which was evacuated by interventional radiology and assessed by general surgery.  He is typically followed by Dr. Nieves of [facility] for his advanced prostate cancer where he receives Orgovyx and Xtandi .  Most recent PSA was 2.6 in July of this year.  I was able to speak with the patient briefly before he was taken away for imaging.  He looks well for his age and comorbidities.  He reports frequent urinary tract infections symptoms.  ------------------  Assessment:   89 y.o. male with fistulous connection between bladder, pubic symphysis, and abdominal fluid collection   Recommendations: # Metastatic prostate cancer # Dystrophic prostatic calcifications # Diffuse bladder wall thickening # Abdominal fluid collection  Initially contacted informally by primary team to review case.  Recent hospital visit showed encouragingly low PSA and CT imaging did not have any findings supporting acute surgical intervention.  This was reviewed with Dr. Nieves, Dr. Cam, and Dr. Alvaro on separate occasions.  Fluid collection was  aspirated by interventional radiology.  As culture data from fluid drain speciated, it was found to have the same bacteria as had been found in the urinary bladder, raising concern for fistulous connection between abdominal fluid collection and urinary bladder.  CT cystogram was collected and identified communication through or with pubic symphysis, urinary bladder, and abdominal fluid collection.  Considering patient's advanced age and multiple rounds of pelvic radiation, he would not be a surgical candidate at this time.  Advised to proceed conservatively with antibiotics and bladder decompression.  Infectious disease will help with a long-term suppressive antibiotic regimen.  Reasonable to resume Orgovyx and Xtandi .  Reviewed ongoing cancer treatment with Dr. Nieves.  Case and plan discussed with Dr. Cam and Dr. Sherlon.  Past Medical History: Past Medical History:  Diagnosis Date   AKI (acute kidney injury) 11/12/2018   CAD (coronary artery disease)    CHF (congestive heart failure) (HCC)    Dyspnea    Foley catheter in place    HTN (hypertension)    Hyperlipidemia    Myocardial infarction (HCC) 2000   REPORTS HE HAS HAD 5 HEART ATTACKS BUT SOME OF THE WERE NOT SERIOUS    Obesity    Prostate cancer [facility])     Past Surgical History:  Past Surgical History:  Procedure Laterality Date   CARDIAC CATHETERIZATION N/A 03/09/2016   Procedure: Left Heart Cath and Coronary Angiography;  Surgeon: Victory LELON Sharps, MD;  Location: [facility] INVASIVE CV LAB;  Service: Cardiovascular;  Laterality: N/A;   COLONOSCOPY N/A 05/28/2024   Procedure: COLONOSCOPY;  Surgeon: Leigh Elspeth SQUIBB, MD;  Location: [facility] ENDOSCOPY;  Service: Gastroenterology;  Laterality: N/A;   CYSTOSCOPY W/ RETROGRADES Bilateral 08/19/2020  Procedure: BILATERAL RETROGRADE PYELOGRAM;  Surgeon: Cam Morene ORN, MD;  Location: [facility] ORS;  Service: Urology;  Laterality: Bilateral;   ESOPHAGOGASTRODUODENOSCOPY N/A 05/28/2024    Procedure: EGD (ESOPHAGOGASTRODUODENOSCOPY);  Surgeon: Leigh Elspeth SQUIBB, MD;  Location: [facility] ENDOSCOPY;  Service: Gastroenterology;  Laterality: N/A;   RADIATION TO PROSTATE     Stenting of LAD  2000   TONSILLECTOMY     TRANSURETHRAL RESECTION OF PROSTATE N/A 03/01/2019   Procedure: TRANSURETHRAL RESECTION OF THE PROSTATE (TURP);  Surgeon: Cam Morene ORN, MD;  Location: [facility] ORS;  Service: Urology;  Laterality: N/A;  75 MINS   TRANSURETHRAL RESECTION OF PROSTATE N/A 08/19/2020   Procedure: TRANSURETHRAL RESECTION OF THE PROSTATE (TURP);  Surgeon: Cam Morene ORN, MD;  Location: [facility] ORS;  Service: Urology;  Laterality: N/A;    Medication: Current Facility-Administered Medications  Medication Dose Route Frequency Provider Last Rate Last Admin   acetaminophen  (TYLENOL ) tablet 650 mg  650 mg Oral Q6H PRN Sundil, Subrina, MD       Or   acetaminophen  (TYLENOL ) suppository 650 mg  650 mg Rectal Q6H PRN Sundil, Subrina, MD       cefTRIAXone  (ROCEPHIN ) 2 g in sodium chloride  0.9 % 100 mL IVPB  2 g Intravenous Q24H Samtani, Jai-Gurmukh, MD 200 mL/hr at 07/17/24 1237 2 g at 07/17/24 1237   Chlorhexidine  Gluconate Cloth 2 % PADS 6 each  6 each Topical Daily Sundil, Subrina, MD   6 each at 07/17/24 0958   enoxaparin (LOVENOX) injection 40 mg  40 mg Subcutaneous Q24H Lola Maurilio PENNER, RPH   40 mg at 07/17/24 9041   ferrous sulfate  tablet 325 mg  325 mg Oral Q breakfast Sundil, Subrina, MD   325 mg at 07/17/24 0816   HYDROcodone-acetaminophen  (NORCO/VICODIN) 5-325 MG per tablet 1 tablet  1 tablet Oral Q4H PRN Samtani, Jai-Gurmukh, MD   1 tablet at 07/12/24 2146   midodrine  (PROAMATINE ) tablet 10 mg  10 mg Oral TID WC Samtani, Jai-Gurmukh, MD   10 mg at 07/17/24 1234   morphine  (PF) 2 MG/ML injection 1 mg  1 mg Intravenous Q4H PRN Samtani, Jai-Gurmukh, MD   1 mg at 07/12/24 1358   ondansetron  (ZOFRAN ) tablet 4 mg  4 mg Oral Q6H PRN Sundil, Subrina, MD       Or   ondansetron  (ZOFRAN ) injection 4 mg  4  mg Intravenous Q6H PRN Sundil, Subrina, MD       pravastatin  (PRAVACHOL ) tablet 20 mg  20 mg Oral QPM Sundil, Subrina, MD   20 mg at 07/16/24 1724   sodium chloride  flush (NS) 0.9 % injection 3 mL  3 mL Intravenous Q12H Sundil, Subrina, MD   3 mL at 07/17/24 0958   sodium chloride  flush (NS) 0.9 % injection 3 mL  3 mL Intravenous Q12H Sundil, Subrina, MD   3 mL at 07/17/24 0958   sodium chloride  flush (NS) 0.9 % injection 3 mL  3 mL Intravenous PRN Sundil, Subrina, MD        Allergies: Allergies  Allergen Reactions   Bactrim [Sulfamethoxazole-Trimethoprim] Hives, Shortness Of Breath and Swelling   Augmentin [Amoxicillin-Pot Clavulanate] Diarrhea   Crestor  [Rosuvastatin  Calcium ] Other (See Comments)    Back pain Grogginess  Dizziness    Zetia  [Ezetimibe ] Other (See Comments)    Back pain Grogginess  Dizziness     Social History: Social History   Tobacco Use   Smoking status: Former    Current packs/day: 0.00    Average packs/day: 0.5 packs/day for  7.0 years (3.5 ttl pk-yrs)    Types: Cigarettes    Start date: 10/04/1955    Quit date: 10/03/1962    Years since quitting: 61.8   Smokeless tobacco: Never  Vaping Use   Vaping status: Never Used  Substance Use Topics   Alcohol use: Yes    Comment: [employer]    Drug use: Not Currently    Family History Family History  Problem Relation Age of Onset   Hypertension Father    Breast cancer Neg Hx    Prostate cancer Neg Hx    Colon cancer Neg Hx    Pancreatic cancer Neg Hx     Review of Systems  Genitourinary:  Negative for dysuria, flank pain, frequency, hematuria and urgency.     Objective   Vital signs in last 24 hours: BP (!) 97/57 (BP Location: Left Arm)   Pulse 74   Temp 98.5 F (36.9 C) (Oral)   Resp 20   Ht 5' 11 (1.803 m)   Wt 67 kg   SpO2 94%   BMI 20.60 kg/m   Physical Exam General: A&O, resting, appropriate HEENT: [place]/AT Pulmonary: Normal work of breathing Cardiovascular: no  cyanosis Abdomen: Soft, NTTP, nondistended GU: Foley catheter in place draining clear yellow urine   Most Recent Labs: Lab Results  Component Value Date   WBC 9.0 07/17/2024   HGB 8.8 (L) 07/17/2024   HCT 27.3 (L) 07/17/2024   PLT 294 07/17/2024    Lab Results  Component Value Date   NA 132 (L) 07/17/2024   K 3.7 07/17/2024   CL 100 07/17/2024   CO2 24 07/17/2024   BUN 11 07/17/2024   CREATININE 0.67 07/17/2024   CALCIUM  7.8 (L) 07/17/2024   MG 1.6 (L) 06/07/2024   PHOS 3.2 11/14/2018   PHOS 3.2 11/14/2018    Lab Results  Component Value Date   INR 1.2 07/10/2024   APTT 28 03/07/2016     Urine Culture: @[id number] (laburin,org,[id number])@   IMAGING: CT CYSTOGRAM ABD/PELVIS Result Date: 07/17/2024 CLINICAL DATA:  Pelvic abscess with possible communication between the bladder and the abscess. EXAM: CT CYSTOGRAM (CT ABDOMEN AND PELVIS WITH CONTRAST) TECHNIQUE: Multi-detector CT imaging through the abdomen and pelvis was performed after dilute contrast had been introduced into the bladder for the purposes of performing CT cystography. RADIATION DOSE REDUCTION: This exam was performed according to the departmental dose-optimization program which includes automated exposure control, adjustment of the mA and/or kV according to patient size and/or use of iterative reconstruction technique. CONTRAST:  50mL OMNIPAQUE  IOHEXOL  300 MG/ML  SOLN COMPARISON:  MRI 07/16/2024. FINDINGS: Lower chest: Bronchiectasis with architectural distortion and atelectasis/scarring noted in both lung bases, right greater. Hepatobiliary: No suspicious focal abnormality in the liver on this study without intravenous contrast. There is no evidence for gallstones, gallbladder wall thickening, or pericholecystic fluid. No intrahepatic or extrahepatic biliary dilation. Pancreas: No focal mass lesion. No dilatation of the main duct. No intraparenchymal cyst. No peripancreatic edema. Spleen: No splenomegaly. No  suspicious focal mass lesion. Adrenals/Urinary Tract: No adrenal nodule or mass. Mild fullness noted right ureter without overt right hydroureteronephrosis. There is mild left hydroureteronephrosis. Contrast material in the bladder has been introduced in a retrograde fashion. Bladder is poorly distended but there is evidence for extraperitoneal extravasation of contrast into the pelvic floor with contrast material tracking anteriorly in the pelvis through the region of the symphysis pubis into a thick walled collection of fluid, gas, and extravasated contrast in the anterior  pelvic wall corresponding to the described abscess on CT of 07/10/2024. the fistula between the bladder seems to tract just caudal to the symphysis pubis although it may also tract directly through the symphysis pubis itself. Stomach/Bowel: Stomach is unremarkable. No gastric wall thickening. No evidence of outlet obstruction. Duodenum is normally positioned as is the ligament of Treitz. No small bowel wall thickening. No small bowel dilatation. The terminal ileum is normal. The appendix is normal. No gross colonic mass. No colonic wall thickening. Moderate stool volume evident. Vascular/Lymphatic: There is moderate atherosclerotic calcification of the abdominal aorta without aneurysm. There is no gastrohepatic or hepatoduodenal ligament lymphadenopathy. No retroperitoneal or mesenteric lymphadenopathy. No pelvic sidewall lymphadenopathy. Reproductive: Prostate gland not well seen. Other: No substantial intraperitoneal free fluid. Musculoskeletal: Diffuse body wall edema evident. There is erosion bony anatomy in the pubic bones bilaterally, left greater than right, raising concern for metastatic disease and/or osteomyelitis. Compression deformity noted at T11 and T12. IMPRESSION: 1. Extraperitoneal extravasation of contrast at the bladder base into the pelvic floor with contrast material tracking anteriorly in the pelvis through the region of  the symphysis pubis into a thick walled collection of fluid, gas, and extravasated contrast in the anterior pelvic wall corresponding to the described abscess on CT of 07/10/2024. 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 2. Erosion of the bony anatomy in the pubic bones bilaterally, left greater than right, raising concern for metastatic disease and/or osteomyelitis. This was better characterized on recent MRI of the pelvis. 3. Mild left hydroureteronephrosis. 4. Bronchiectasis with architectural distortion and atelectasis/scarring in both lung bases, right greater than left. 5. Diffuse body wall edema. 6. Compression deformity at T11 and T12. 7.  Aortic Atherosclerosis (ICD10-I70.0). Electronically Signed   By: Camellia Candle M.D.   On: 07/17/2024 12:50   MR PELVIS W WO CONTRAST Result Date: 07/17/2024 CLINICAL DATA:  Prior radiation therapy to the pelvis for prostate cancer. Cortical erosions of the pubis. History pelvic/prostate radiotherapy in 2003, salvage SBRT to the left seminal vesicle and T12 in 2022, and more recently SP RT to a left pubic ramus metastasis in 2023. EXAM: MRI PELVIS WITHOUT AND WITH CONTRAST TECHNIQUE: Multiplanar multisequence MR imaging of the pelvis was performed both before and after administration of intravenous contrast. CONTRAST:  6.5mL GADAVIST GADOBUTROL 1 MMOL/ML IV SOLN COMPARISON:  CT scan 07/10/2024 FINDINGS: Urinary Tract: Mild distal hydroureter. Irregular and thick-walled urinary bladder with enhancing margins compatible with cystitis, with a Foley catheter in place. There is some mixed signal intensity material inferiorly in the urinary bladder below the Foley catheter balloon, and suspected continuity of the anterior wall of the urinary bladder with the pubic symphysis on image 36 series 11. The fluid in the pubic symphysis itself is questionably continuous with an apparent abscess of the rectus  abdominus musculature (left greater than right) and linea alba measuring about 7.1 by 2.4 by 8.3 cm (volume = 74 cm^3), with internal gas and fluid density and prominently enhancing margins. If clinically warranted, cystogram conducted through the Foley catheter could be utilized to assess for confirm continuity of the urinary bladder with the pubic symphysis and anterior abdominal wall abscess. Bowel: Moderate lower rectal wall thickening, radiation proctitis not excluded. Vascular/Lymphatic: Unremarkable Reproductive:  Recognizable prostate gland not identified. Other: Low-level edema tracks along the iliacus and gluteal musculature. More striking edema along the hip adductor musculature and pelvic sidewall musculature could reflect myositis. Musculoskeletal: Bony erosions  in the pubic bodies along the pubic symphysis with associated marrow edema in the pubic bodies and adjacent pubic rami favoring osteomyelitis. Enhancing lesion favoring a metastatic deposit in the left ischial tuberosity measuring 2.8 cm in diameter on image 40 series 7. Bandlike edema with central low signal T2 intensity in the right iliac bone adjacent to the SI joint measuring 4.4 by 1.5 cm on image 19 series 6, metastatic lesion not excluded. 1.5 cm in diameter lesion with high T2 and low T1 signal in the left femoral head and neck on image 24 series 3, metastatic lesion not excluded. Lower lumbar spondylosis and degenerative disc disease. IMPRESSION: 1. Bony erosions in the pubic bodies along the pubic symphysis with associated marrow edema in the pubic bodies and adjacent pubic rami favoring osteomyelitis. 2. Suspected continuity of the anterior lumen of the urinary bladder with the pubic symphysis and anterior abdominal wall abscess. if clinically warranted, cystogram through the Foley catheter could be utilized to assess for continuity of the urinary bladder with the pubic symphysis and anterior abdominal wall abscess. 3. Enhancing  lesion favoring a metastatic deposit in the left ischial tuberosity. 4. Bandlike edema with central low signal T2 intensity in the right iliac bone adjacent to the SI joint, metastatic lesion not excluded. 5. 1.5 cm in diameter lesion with high T2 and low T1 signal in the left femoral head and neck, metastatic lesion not excluded. 6. Moderate lower rectal wall thickening, radiation proctitis not excluded. 7. Mild distal hydroureter. 8. Low-level edema tracks along the iliacus and gluteal musculature. More striking edema along the hip adductor musculature and pelvic sidewall musculature could reflect myositis. Electronically Signed   By: Ryan Salvage M.D.   On: 07/17/2024 11:43    ------  Ole Bourdon, NP Pager: [phone]   Please contact the urology consult pager with any further questions/concerns.

## 2024-07-17 NOTE — Progress Notes (Signed)
 Radiation Oncology         [phone]) [phone] ________________________________  Name: Ronald Kaiser MRN: [id number]  Date: 07/10/2024  DOB: 20-Jun-1934  Chart Note:  I reviewed this patient's most recent findings and wanted to take a minute to document my impression.  Ronald Kaiser is a 89 year old man with a history of pelvic radiotherapy (2003) and more recent SBRT to the left pubic ramus for oligometastatic prostate cancer who is admitted with lower abdominal pain and fever. 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  Given the prior high-dose pelvic irradiation and current tissue quality, the patient has been deemed not a candidate for operative repair by the urology team. He is clinically improving on broad-spectrum antibiotics with better pain control.   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 Best current course is conservative, non-surgical management aimed at infection control, urinary diversion/leak minimization, and promotion of radiated-tissue healing.  Recommended Considerations: Antimicrobial therapy (ID to lead): Continue culture-guided antibiotics targeting urinary and bone pathogens. Anticipate prolonged course (often 6-12 weeks) with ESR/CRP every 2-4 weeks to follow response. Re-image (see #7) if clinical status plateaus or worsens. Urinary leak mitigation (Urology to lead): Maintain continuous bladder drainage (indwelling catheter). If leakage persists or catheter not tolerated, consider suprapubic diversion to reduce urinary passage across the symphysis. Avoid traumatic instrumentation when possible. ORN  medical regimen (Radiation Oncology to initiate, coordinate with ID/Primary Team): Pentoxifylline (Trental) 400 mg PO three times daily with food, as tolerated. Vitamin E (alpha-tocopherol) 1,000 IU PO daily. Check for drug-drug interactions (anticoagulants/antiplatelets) and monitor for GI upset, dizziness, or bleeding risk. If inadequate response after 6-8 weeks and infection controlled, may discuss adding a bisphosphonate (e.g., clodronate) off-label in a multidisciplinary forum. Hyperbaric Oxygen Therapy (HBOT): Outpatient referral once stable: typical course 30-40 sessions, 90 min at 2.0-2.5 ATA, 5 days/week. Screen for contraindications (e.g., untreated pneumothorax, certain ear/sinus conditions). HBOT to be sequenced with antibiotics; Ronald start while on therapy if logistics allow. Abscess management: Continue current drainage strategy (if placed) and wound care. Interventional Radiology to reassess if the collection enlarges or fails to improve clinically. Analgesia and supportive care: Multimodal pain control; avoid NSAIDs if bleeding risk or renal concerns. Bowel regimen while on opioids; antiemetics as needed. Monitoring and follow-up imaging: Pelvic MRI (with metal artifact reduction if applicable) in 6-8 weeks to reassess marrow edema, fistula tract, and abscess cavity; earlier if clinical decline. Consider repeat CT cystogram if urinary leakage persists despite diversion. Rehabilitation & activity: Protected weight-bearing as tolerated to minimize symphyseal stress; PT for safe mobility. Nutrition & risk modification: High-protein diet; optimize vitamin D/calcium . Care coordination & follow-up appointments: ID clinic follow-up arranged for antibiotic course and labs. Urology follow-up for bladder management/diversion assessment. Radiation Oncology follow-up in 2-3 weeks to review tolerance to PENTO/Vit E and HBOT scheduling, and again after interval  imaging.  Prognosis/Expectations: With infection control, continuous urinary drainage, and radiated-tissue healing measures (pentoxifylline/Vitamin E and HBOT), many patients experience gradual pain reduction and functional improvement over weeks to a few months. We will continue conservative management unless there is clinical deterioration that re-opens discussion of surgical options.   ------------------------------------------------   Donnice Barge, MD [facility]  Radiation Oncology Direct Dial: [phone]  Fax: [phone] [email or web address]  Skype  LinkedIn

## 2024-07-17 NOTE — Plan of Care (Signed)
   Problem: Education: Goal: Knowledge of General Education information will improve Description Including pain rating scale, medication(s)/side effects and non-pharmacologic comfort measures Outcome: Progressing   Problem: Health Behavior/Discharge Planning: Goal: Ability to manage health-related needs will improve Outcome: Progressing

## 2024-07-17 NOTE — Plan of Care (Signed)
 Awaiting cystogram to characterize possible fistulous connection between bladder and abd fluid collection. Will formally consult after imaging read is available.

## 2024-07-17 NOTE — Progress Notes (Signed)
 PROGRESS NOTE    Ronald Kaiser  FMW:[id number] DOB: 12-17-1933 DOA: 07/10/2024 PCP: [name], [name]   Chief Complaint  Patient presents with   Shortness of Breath   Abdominal Pain    Brief Narrative:   89 year old white male HFrEF 25-30% Metastatic lesions from prostatic cancer on Xtandi  managed previously by Dr. Cam Dr. Patrcia high if and has had radiation proctitis with anemia previously and underwent colonoscopy 05/28/2024 showing severe radiation proctitis with APC prohibiting full colonoscopy-at the time was given Carafate and instructed to follow-up as an outpatient Permanent A-fib not on anticoagulation complicated by severe orthostasis precluding rate control and not a good candidate for anticoagulation because of elevated has bled score HLD Previous channel TURP 03/01/2019 Dr. Cam   Hospitalized 9/4 through 06/10/2024 after a fall hitting the back of the counter was hypotensive went to the ICU had pressors and was placed on midodrine  that admission septic shock 2/2 UTI negative cultures completed 7 days cefadroxil  total at that admission also acute traumatic T11, chronic T12 and 3rd and 4th rib fractures which were managed conservatively per orthopedics with TLSO found to have left rectal wall hematoma as well General Surgery did not feel any role for intervention at the time     Patient followed up with nurse practitioner 9/12 after hospitalization he was taking aspirin  325 scheduled for iron  infusion    9/23 followed with cardiology-they document difficulty understanding what meds he takes and they decided to workup syncope and a 2-week live monitor was ordered but he was not felt to be an ICD candidate and GDMT was limited by his hypotension and they hinted at maybe having a goals of care discussion   10/8 brought to ED with lower abdominal pain radiating down to his legs over the past 7 to 10 days improves when he does not move or do any exercises associated  with shortness of breath occasionally--- he was found to have rectus sheath hematoma/abscess/other on the CT angio as dictated below for this indication because of severe pain   Labs on admit Sodium 133 potassium 4.2 BUN/creatinine 41/1.0 troponin 82 lactic acid 3.1 WBC 22 hemoglobin 8 platelet 360 INR 1.2 Blood culture X2 obtained Urine analysis negative Echocardiogram EF 25-30%   Imaging CXR 1 view shallow inspiration emphysematous changes fibrosis bronchitic changes linear atelectasis no focal consolidation sclerotic bone lesions known metastatic disease CT angio abdominal chest pelvis = increasing soft tissue mass infiltration low pelvis involving prostate bed extending to left pelvic sidewall and into proximal left adductor muscles?  Residual recurrent tumor versus posttreatment changes/possible inflammatory process-diffuse bladder wall thickening 2/2 Boop/posttreatment changes + calcification bladder prostatic region-bilateral hydronephrosis and hydroureter related to reflux or bladder outlet obstruction Low anterior rectus abdominis collection with surrounding stranding secondary to abscess? Cortical erosion symphysis pubis this?  Osteomyelitis radiation changes Multiple sclerotic bony lesions?  Metastatic disease   Assessment & Plan:   Principal Problem:   Abdominal wall abscess Active Problems:   Sepsis (HCC)   Essential hypertension   Prostate cancer metastatic to bone (HCC)   Paroxysmal atrial fibrillation (HCC)   History of CAD (coronary artery disease)   HFrEF (heart failure with reduced ejection fraction) (HCC)   Elevated troponin     Intra-abdominal rectus sheath abscess versus infected hematoma  - post aspiration by IR abscess/infected rectus sheath hematoma, culture growing Proteus mirabilis, empirically on vancomycin  and Zosyn, narrowed to Rocephin  as culture growing pansensitive Proteus . - Appears to be much improved on  physical exam, no swelling, tenderness or  erythema . - Continue with as needed pain regimen . - MRI pelvis done 10/14, just was read, with concern of surrounding osteomyelitisPelvic rami and pubic area, will consult ID regarding recommendation for antibiotics treatment. - Concern for continuity of anterior lumen of urinary bladder with pubic symphysis and anterior abdominal wall abscess, urology consulted.  Anemia acute blood loss?  Secondary to hematoma -Continue with supportive transfusion  Metastatic prostate cancer XRT resection -Oncolgy/radiation oncology input greatly appreciated -Gleason 4+4 initially treated 2003- -ADT X 3 years Dr. Francella [place]  -2019 bone scan restaging showed right sixth rib- -03/01/2019 recurrent prostate cancer involving 95% resected tissue with increase in PSA to 6.55 in 2020 -Repeat TURP 08/19/2020 -had a PET scan last 02/01/2021 and underwent XRT as above   Atrial fibrillation CHADVASC >4 not on anticoagulation has bled score >4  Hypotension related cardiomyopathy  CAD  chronic HFrEF -Continues midodrine  to 10 3 times daily  -No GDMT f at this time-is not volume overloaded-his statin from admission has been held, I have held his Lasix  from prior to admission additionally  Previous radiation proctitis with GI bleed -Poor  candidacy of anticoagulation or even aspirin  at this time -Would not resume aspirin  and needs follow-up in the outpatient to discuss this -Previous fall with multiple fractures  DVT prophylaxis: (Lovenox) Code Status: (Full) Family Communication: (none at bedside) Disposition:   Status is: Inpatient    Consultants:  Oncology General surgery Rad/ONC Cardiology urology   Subjective: He denies any complaints today  Objective: Vitals:   07/17/24 0200 07/17/24 0400 07/17/24 0536 07/17/24 0700  BP:  (!) 89/48 (!) 111/56 90/61  Pulse: 80 89  74  Resp: 20 (!) 22  20  Temp:  98.4 F (36.9 C)  98.3 F (36.8 C)  TempSrc:  Oral  Oral  SpO2: 98% 92% 92% 94%   Weight:      Height:        Intake/Output Summary (Last 24 hours) at 07/17/2024 1212 Last data filed at 07/17/2024 0800 Gross per 24 hour  Intake --  Output 2000 ml  Net -2000 ml   Filed Weights   07/10/24 1755 07/11/24 2013  Weight: 64 kg 67 kg    Examination:  Awake Alert, Oriented X 3, No new F.N deficits, Normal affect Symmetrical Chest wall movement, Good air movement bilaterally, CTAB RRR,No Gallops,Rubs or new Murmurs, No Parasternal Heave +ve B.Sounds, Abd Soft, No tenderness, No rebound - guarding or rigidity. No Cyanosis, Clubbing or edema, No new Rash or bruise       Data Reviewed: I have personally reviewed following labs and imaging studies  CBC: Recent Labs  Lab 07/13/24 0538 07/14/24 0516 07/15/24 0418 07/16/24 0258 07/17/24 0401  WBC 13.0* 11.5* 9.2 9.7 9.0  NEUTROABS 10.9* 9.7* 7.5 7.8* 7.4  HGB 9.2* 9.1* 9.2* 8.8* 8.8*  HCT 28.9* 29.0* 29.0* 28.0* 27.3*  MCV 92.6 94.5 92.9 93.6 93.2  PLT 333 328 306 332 294    Basic Metabolic Panel: Recent Labs  Lab 07/13/24 0538 07/14/24 0516 07/15/24 0418 07/16/24 0258 07/17/24 0401  NA 135 137 134* 135 132*  K 3.4* 3.4* 3.7 3.6 3.7  CL 96* 98 98 100 100  CO2 25 25 25 25 24   GLUCOSE 102* 159* 109* 107* 107*  BUN 19 14 15 12 11   CREATININE 0.79 0.90 0.77 0.71 0.67  CALCIUM  7.7* 7.7* 7.9* 7.8* 7.8*    GFR: Estimated Creatinine Clearance: 61.6 mL/min (by  C-G formula based on SCr of 0.67 mg/dL).  Liver Function Tests: Recent Labs  Lab 07/11/24 0537 07/12/24 2057 07/13/24 0538 07/14/24 0516 07/15/24 0418  AST 15 16 15 17 17   ALT 15 13 13 12 13   ALKPHOS 55 60 57 56 52  BILITOT 0.4 0.4 0.4 0.3 0.4  PROT 5.5* 5.8* 5.6* 5.7* 6.1*  ALBUMIN 1.5* <1.5* <1.5* <1.5* 1.5*    CBG: No results for input(s): GLUCAP in the last 168 hours.   Recent Results (from the past 240 hours)  Blood Culture (routine x 2)     Status: None   Collection Time: 07/10/24  7:05 PM   Specimen: BLOOD  Result  Value Ref Range Status   Specimen Description BLOOD RIGHT ANTECUBITAL  Final   Special Requests   Final    BOTTLES DRAWN AEROBIC AND ANAEROBIC Blood Culture adequate volume   Culture   Final    NO GROWTH 5 DAYS Performed at [facility], [address], [place], [place] [address]    Report Status 07/15/2024 FINAL  Final  Blood Culture (routine x 2)     Status: None   Collection Time: 07/10/24  7:10 PM   Specimen: BLOOD RIGHT FOREARM  Result Value Ref Range Status   Specimen Description BLOOD RIGHT FOREARM  Final   Special Requests   Final    BOTTLES DRAWN AEROBIC AND ANAEROBIC Blood Culture adequate volume   Culture   Final    NO GROWTH 5 DAYS Performed at [facility], [address], [place], [place] [address]    Report Status 07/15/2024 FINAL  Final  Urine Culture (for pregnant, neutropenic or urologic patients or patients with an indwelling urinary catheter)     Status: Abnormal   Collection Time: 07/11/24 12:47 AM   Specimen: Urine, Clean Catch  Result Value Ref Range Status   Specimen Description URINE, CLEAN CATCH  Final   Special Requests   Final    Immunocompromised Performed at [facility], [address], [place], [place] [address]    Culture >=100,000 COLONIES/mL PROTEUS MIRABILIS (A)  Final   Report Status 07/13/2024 FINAL  Final   Organism ID, Bacteria PROTEUS MIRABILIS (A)  Final      Susceptibility   Proteus mirabilis - MIC*    AMPICILLIN <=2 SENSITIVE Sensitive     CEFAZOLIN (URINE) Value in next row Sensitive      <=1 SENSITIVEThis is a modified FDA-approved test that has been validated and its performance characteristics determined by the reporting laboratory.  This laboratory is certified under the Clinical Laboratory Improvement Amendments CLIA as qualified to perform high complexity clinical laboratory testing.    CEFEPIME  Value in next row Sensitive      <=1 SENSITIVEThis is a modified FDA-approved test that has been validated and its  performance characteristics determined by the reporting laboratory.  This laboratory is certified under the Clinical Laboratory Improvement Amendments CLIA as qualified to perform high complexity clinical laboratory testing.    ERTAPENEM Value in next row Sensitive      <=1 SENSITIVEThis is a modified FDA-approved test that has been validated and its performance characteristics determined by the reporting laboratory.  This laboratory is certified under the Clinical Laboratory Improvement Amendments CLIA as qualified to perform high complexity clinical laboratory testing.    CEFTRIAXONE  Value in next row Sensitive      <=1 SENSITIVEThis is a modified FDA-approved test that has been validated and its performance characteristics determined  by the reporting laboratory.  This laboratory is certified under the Clinical Laboratory Improvement Amendments CLIA as qualified to perform high complexity clinical laboratory testing.    CIPROFLOXACIN  Value in next row Sensitive      <=1 SENSITIVEThis is a modified FDA-approved test that has been validated and its performance characteristics determined by the reporting laboratory.  This laboratory is certified under the Clinical Laboratory Improvement Amendments CLIA as qualified to perform high complexity clinical laboratory testing.    GENTAMICIN  Value in next row Sensitive      <=1 SENSITIVEThis is a modified FDA-approved test that has been validated and its performance characteristics determined by the reporting laboratory.  This laboratory is certified under the Clinical Laboratory Improvement Amendments CLIA as qualified to perform high complexity clinical laboratory testing.    NITROFURANTOIN Value in next row Resistant      <=1 SENSITIVEThis is a modified FDA-approved test that has been validated and its performance characteristics determined by the reporting laboratory.  This laboratory is certified under the Clinical Laboratory Improvement Amendments CLIA as  qualified to perform high complexity clinical laboratory testing.    TRIMETH/SULFA Value in next row Sensitive      <=1 SENSITIVEThis is a modified FDA-approved test that has been validated and its performance characteristics determined by the reporting laboratory.  This laboratory is certified under the Clinical Laboratory Improvement Amendments CLIA as qualified to perform high complexity clinical laboratory testing.    AMPICILLIN/SULBACTAM Value in next row Sensitive      <=1 SENSITIVEThis is a modified FDA-approved test that has been validated and its performance characteristics determined by the reporting laboratory.  This laboratory is certified under the Clinical Laboratory Improvement Amendments CLIA as qualified to perform high complexity clinical laboratory testing.    PIP/TAZO Value in next row Sensitive      <=4 SENSITIVEThis is a modified FDA-approved test that has been validated and its performance characteristics determined by the reporting laboratory.  This laboratory is certified under the Clinical Laboratory Improvement Amendments CLIA as qualified to perform high complexity clinical laboratory testing.    MEROPENEM Value in next row Sensitive      <=4 SENSITIVEThis is a modified FDA-approved test that has been validated and its performance characteristics determined by the reporting laboratory.  This laboratory is certified under the Clinical Laboratory Improvement Amendments CLIA as qualified to perform high complexity clinical laboratory testing.    * >=100,000 COLONIES/mL PROTEUS MIRABILIS  Aerobic/Anaerobic Culture w Gram Stain (surgical/deep wound)     Status: None (Preliminary result)   Collection Time: 07/12/24  3:09 PM   Specimen: Abscess  Result Value Ref Range Status   Specimen Description ABSCESS  Final   Special Requests LLQ  Final   Gram Stain   Final    ABUNDANT WBC PRESENT, PREDOMINANTLY PMN RARE GRAM POSITIVE COCCI IN CLUSTERS Performed at [facility]  Lab, [address] [place], [place] [address]    Culture   Final    MODERATE PROTEUS MIRABILIS NO ANAEROBES ISOLATED; CULTURE IN PROGRESS FOR 5 DAYS    Report Status PENDING  Incomplete   Organism ID, Bacteria PROTEUS MIRABILIS  Final      Susceptibility   Proteus mirabilis - MIC*    AMPICILLIN <=2 SENSITIVE Sensitive     CEFAZOLIN (NON-URINE) 4 INTERMEDIATE Intermediate     CEFEPIME  <=0.12 SENSITIVE Sensitive     ERTAPENEM <=0.12 SENSITIVE Sensitive     CEFTRIAXONE  <=0.25 SENSITIVE Sensitive     CIPROFLOXACIN  <=0.06 SENSITIVE  Sensitive     GENTAMICIN  <=1 SENSITIVE Sensitive     MEROPENEM <=0.25 SENSITIVE Sensitive     TRIMETH/SULFA <=20 SENSITIVE Sensitive     AMPICILLIN/SULBACTAM <=2 SENSITIVE Sensitive     PIP/TAZO Value in next row Sensitive      <=4 SENSITIVEThis is a modified FDA-approved test that has been validated and its performance characteristics determined by the reporting laboratory.  This laboratory is certified under the Clinical Laboratory Improvement Amendments CLIA as qualified to perform high complexity clinical laboratory testing.    * MODERATE PROTEUS MIRABILIS         Radiology Studies: MR PELVIS W WO CONTRAST Result Date: 07/17/2024 CLINICAL DATA:  Prior radiation therapy to the pelvis for prostate cancer. Cortical erosions of the pubis. History pelvic/prostate radiotherapy in 2003, salvage SBRT to the left seminal vesicle and T12 in 2022, and more recently SP RT to a left pubic ramus metastasis in 2023. EXAM: MRI PELVIS WITHOUT AND WITH CONTRAST TECHNIQUE: Multiplanar multisequence MR imaging of the pelvis was performed both before and after administration of intravenous contrast. CONTRAST:  6.5mL GADAVIST GADOBUTROL 1 MMOL/ML IV SOLN COMPARISON:  CT scan 07/10/2024 FINDINGS: Urinary Tract: Mild distal hydroureter. Irregular and thick-walled urinary bladder with enhancing margins compatible with cystitis, with a Foley catheter in place. There is some mixed signal  intensity material inferiorly in the urinary bladder below the Foley catheter balloon, and suspected continuity of the anterior wall of the urinary bladder with the pubic symphysis on image 36 series 11. The fluid in the pubic symphysis itself is questionably continuous with an apparent abscess of the rectus abdominus musculature (left greater than right) and linea alba measuring about 7.1 by 2.4 by 8.3 cm (volume = 74 cm^3), with internal gas and fluid density and prominently enhancing margins. If clinically warranted, cystogram conducted through the Foley catheter could be utilized to assess for confirm continuity of the urinary bladder with the pubic symphysis and anterior abdominal wall abscess. Bowel: Moderate lower rectal wall thickening, radiation proctitis not excluded. Vascular/Lymphatic: Unremarkable Reproductive:  Recognizable prostate gland not identified. Other: Low-level edema tracks along the iliacus and gluteal musculature. More striking edema along the hip adductor musculature and pelvic sidewall musculature could reflect myositis. Musculoskeletal: Bony erosions in the pubic bodies along the pubic symphysis with associated marrow edema in the pubic bodies and adjacent pubic rami favoring osteomyelitis. Enhancing lesion favoring a metastatic deposit in the left ischial tuberosity measuring 2.8 cm in diameter on image 40 series 7. Bandlike edema with central low signal T2 intensity in the right iliac bone adjacent to the SI joint measuring 4.4 by 1.5 cm on image 19 series 6, metastatic lesion not excluded. 1.5 cm in diameter lesion with high T2 and low T1 signal in the left femoral head and neck on image 24 series 3, metastatic lesion not excluded. Lower lumbar spondylosis and degenerative disc disease. IMPRESSION: 1. Bony erosions in the pubic bodies along the pubic symphysis with associated marrow edema in the pubic bodies and adjacent pubic rami favoring osteomyelitis. 2. Suspected continuity of  the anterior lumen of the urinary bladder with the pubic symphysis and anterior abdominal wall abscess. if clinically warranted, cystogram through the Foley catheter could be utilized to assess for continuity of the urinary bladder with the pubic symphysis and anterior abdominal wall abscess. 3. Enhancing lesion favoring a metastatic deposit in the left ischial tuberosity. 4. Bandlike edema with central low signal T2 intensity in the right iliac bone adjacent to the SI  joint, metastatic lesion not excluded. 5. 1.5 cm in diameter lesion with high T2 and low T1 signal in the left femoral head and neck, metastatic lesion not excluded. 6. Moderate lower rectal wall thickening, radiation proctitis not excluded. 7. Mild distal hydroureter. 8. Low-level edema tracks along the iliacus and gluteal musculature. More striking edema along the hip adductor musculature and pelvic sidewall musculature could reflect myositis. Electronically Signed   By: Ryan Salvage M.D.   On: 07/17/2024 11:43        Scheduled Meds:  Chlorhexidine  Gluconate Cloth  6 each Topical Daily   enoxaparin (LOVENOX) injection  40 mg Subcutaneous Q24H   ferrous sulfate   325 mg Oral Q breakfast   midodrine   10 mg Oral TID WC   pravastatin   20 mg Oral QPM   sodium chloride  flush  3 mL Intravenous Q12H   sodium chloride  flush  3 mL Intravenous Q12H   Continuous Infusions:  cefTRIAXone  (ROCEPHIN )  IV 2 g (07/16/24 1227)     LOS: 6 days      Brayton Lye, MD [facility]   To contact the attending provider between 7A-7P or the covering provider during after hours 7P-7A, please log into the web site [email or web address] and access using universal [place] password for that web site. If you do not have the password, please call the hospital operator.  07/17/2024, 12:12 PM

## 2024-07-17 NOTE — Progress Notes (Signed)
   07/17/24 1540  Mobility  Activity Ambulated with assistance  Level of Assistance Standby assist, set-up cues, supervision of patient - no hands on  Assistive Device Front wheel walker;Four wheel walker  Distance Ambulated (ft) 300 ft  Activity Response Tolerated fair  Mobility Referral Yes  Mobility visit 1 Mobility  Mobility Specialist Start Time (ACUTE ONLY) 1540  Mobility Specialist Stop Time (ACUTE ONLY) 1550  Mobility Specialist Time Calculation (min) (ACUTE ONLY) 10 min   Mobility Specialist: Progress Note  Pt agreeable to mobility session - received in standstill chair. Pt was asymptomatic throughout session with no complaints. Returned to stand still chair with all needs met - call bell within reach.   Additional comments: RN present in room. Pt took one standing rest break stated I just need to rest. Denied fatigue, weakness, and pain.   Virgle Boards, BS Mobility Specialist Please contact via SecureChat or  Rehab office at [phone].

## 2024-07-18 DIAGNOSIS — M869 Osteomyelitis, unspecified: Secondary | ICD-10-CM | POA: Diagnosis not present

## 2024-07-18 DIAGNOSIS — R222 Localized swelling, mass and lump, trunk: Secondary | ICD-10-CM

## 2024-07-18 DIAGNOSIS — B964 Proteus (mirabilis) (morganii) as the cause of diseases classified elsewhere: Secondary | ICD-10-CM

## 2024-07-18 DIAGNOSIS — L02211 Cutaneous abscess of abdominal wall: Secondary | ICD-10-CM | POA: Diagnosis not present

## 2024-07-18 LAB — CBC WITH DIFFERENTIAL/PLATELET
Abs Immature Granulocytes: 0.03 K/uL (ref 0.00–0.07)
Basophils Absolute: 0.1 K/uL (ref 0.0–0.1)
Basophils Relative: 1 %
Eosinophils Absolute: 0.1 K/uL (ref 0.0–0.5)
Eosinophils Relative: 1 %
HCT: 29.2 % — ABNORMAL LOW (ref 39.0–52.0)
Hemoglobin: 9.2 g/dL — ABNORMAL LOW (ref 13.0–17.0)
Immature Granulocytes: 0 %
Lymphocytes Relative: 11 %
Lymphs Abs: 0.9 K/uL (ref 0.7–4.0)
MCH: 29.7 pg (ref 26.0–34.0)
MCHC: 31.5 g/dL (ref 30.0–36.0)
MCV: 94.2 fL (ref 80.0–100.0)
Monocytes Absolute: 0.4 K/uL (ref 0.1–1.0)
Monocytes Relative: 5 %
Neutro Abs: 6.4 K/uL (ref 1.7–7.7)
Neutrophils Relative %: 82 %
Platelets: 322 K/uL (ref 150–400)
RBC: 3.1 MIL/uL — ABNORMAL LOW (ref 4.22–5.81)
RDW: 19.2 % — ABNORMAL HIGH (ref 11.5–15.5)
WBC: 7.8 K/uL (ref 4.0–10.5)
nRBC: 0 % (ref 0.0–0.2)

## 2024-07-18 LAB — BASIC METABOLIC PANEL WITH GFR
Anion gap: 9 (ref 5–15)
BUN: 14 mg/dL (ref 8–23)
CO2: 25 mmol/L (ref 22–32)
Calcium: 7.9 mg/dL — ABNORMAL LOW (ref 8.9–10.3)
Chloride: 100 mmol/L (ref 98–111)
Creatinine, Ser: 0.65 mg/dL (ref 0.61–1.24)
GFR, Estimated: >60 mL/min (ref >60)
Glucose, Bld: 103 mg/dL — ABNORMAL HIGH (ref 70–99)
Potassium: 3.9 mmol/L (ref 3.5–5.1)
Sodium: 134 mmol/L — ABNORMAL LOW (ref 135–145)

## 2024-07-18 LAB — C-REACTIVE PROTEIN: CRP: 7 mg/dL — ABNORMAL HIGH (ref <1.0)

## 2024-07-18 LAB — SEDIMENTATION RATE: Sed Rate: 136 mm/h — ABNORMAL HIGH (ref 0–16)

## 2024-07-18 MED ORDER — ADULT MULTIVITAMIN W/MINERALS CH
1.0000 | ORAL_TABLET | Freq: Every day | ORAL | Status: DC
  Administered 2024-07-19 – 2024-07-23 (×5): 1 via ORAL
  Filled 2024-07-18 (×5): qty 1

## 2024-07-18 MED ORDER — PENTOXIFYLLINE ER 400 MG PO TBCR
400.0000 mg | EXTENDED_RELEASE_TABLET | Freq: Three times a day (TID) | ORAL | Status: DC
  Administered 2024-07-18 – 2024-07-23 (×17): 400 mg via ORAL
  Filled 2024-07-18 (×18): qty 1

## 2024-07-18 MED ORDER — ENSURE PLUS HIGH PROTEIN PO LIQD
237.0000 mL | Freq: Three times a day (TID) | ORAL | Status: DC
  Administered 2024-07-18 – 2024-07-23 (×17): 237 mL via ORAL

## 2024-07-18 MED ORDER — PANTOPRAZOLE SODIUM 40 MG PO TBEC
40.0000 mg | DELAYED_RELEASE_TABLET | Freq: Every day | ORAL | Status: DC
  Administered 2024-07-18 – 2024-07-23 (×6): 40 mg via ORAL
  Filled 2024-07-18 (×6): qty 1

## 2024-07-18 MED ORDER — VITAMIN E 45 MG (100 UNIT) PO CAPS
1000.0000 [IU] | ORAL_CAPSULE | Freq: Every day | ORAL | Status: DC
  Administered 2024-07-19 – 2024-07-23 (×5): 1000 [IU] via ORAL
  Filled 2024-07-18 (×7): qty 2

## 2024-07-18 MED ORDER — VITAMIN E 45 MG (100 UNIT) PO CAPS
1000.0000 [IU] | ORAL_CAPSULE | Freq: Every day | ORAL | Status: DC
  Filled 2024-07-18: qty 10

## 2024-07-18 NOTE — Progress Notes (Signed)
 PHARMACY CONSULT NOTE FOR:  OUTPATIENT  PARENTERAL ANTIBIOTIC THERAPY (OPAT)  Indication: Abdominal wall abscess Regimen: Ceftriaxone  IV 2g Q24H End date: 08/23/2024  IV antibiotic discharge orders are pended. To discharging provider:  please sign these orders via discharge navigator,  Select New Orders & click on the button choice - Manage This Unsigned Work.    Thank you for allowing pharmacy to be a part of this patient's care.  Feliciano Close, PharmD PGY2 Infectious Diseases Pharmacy Resident  07/18/2024 12:07 PM

## 2024-07-18 NOTE — Progress Notes (Signed)
 PROGRESS NOTE    Ronald Kaiser  FMW:[id number] DOB: 10/29/1933 DOA: 07/10/2024 PCP: [name], [name]   Chief Complaint  Patient presents with   Shortness of Breath   Abdominal Pain    Brief Narrative:   89 year old white male, HFrEF 25-30%, Metastatic lesions from prostatic cancer on Xtandi , he is managed currently by Dr. Nieves ,has had radiation proctitis with anemia previously and underwent colonoscopy 05/28/2024 showing severe radiation proctitis with APC prohibiting full colonoscopy-at the time was given Carafate and instructed to follow-up as an outpatient Permanent A-fib not on anticoagulation complicated by severe orthostasis precluding rate control and not a good candidate for anticoagulation because of history of recent GI bleed. -Patient with multiple recent hospitalization last August and September due to GI bleed radiation proctitis and UTI. -Patient presents to ED with lower abdominal pain, workup significant for UTI/cystitis, with abdominal wall rectus sheath abscess, further workup also showing evidence of metastatic prostate disease, please see discussion below.     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 - Appears to be much improved on physical exam, no swelling, tenderness or erythema . - Continue with as needed pain regimen  . - MRI pelvis done 10/14, just was read, with concern of surrounding osteomyelitisPelvic rami and pubic area, will consult ID regarding recommendation for antibiotics treatment. - Concern for continuity of anterior lumen of urinary bladder with pubic symphysis and anterior abdominal wall abscess, urology consulted.  Surgical candidate - ED consulted regarding antibiotic recommendation, likely will need prolonged IV antibiotics on discharge  Anemia, multifactorial due to malignancy and chronic blood loss anemia -Continue with supportive transfusion, received 1 unit 10/ - Hemoglobin remained stable  Metastatic prostate cancer XRT resection - Urology/Oncolgy/radiation oncology input greatly appreciated - Reasonable to resume Orgovyx and Xtandi  per urology - Patient requested palliative medicine consult  Radiation associated pubic symphysis ORN - Rad/onc input greatly appreciated, started on Trental and vitamin E - can consider bisphosphonate in 6 to 8 weeks  Atrial fibrillation CHADVASC >4 not on anticoagulation has bled score >4, recent hospitalization for associated hematoma and bleed due to radiation proctitis  CAD  chronic HFrEF -Continues midodrine  to 10 milligram 3 times daily  -No GDMT f at this time-is not volume overloaded-his statin from admission has been held, I have held his Lasix  from prior to admission additionally - He appears to be a euvolemic  Previous radiation proctitis with GI bleed -Poor  candidacy of anticoagulation or even aspirin  at this time -Would not resume aspirin  and needs follow-up in the outpatient to discuss this -Previous fall with multiple fractures  DVT prophylaxis: (Lovenox) Code Status: (Full) Family Communication: (none at bedside) Disposition:   Status is: Inpatient    Consultants:  Oncology General surgery Rad/ONC Cardiology Urology Infectious disease Palliative medicine   Subjective: Patient reports good appetite, able to sit  on the chair yesterday,  Objective: Vitals:   07/17/24 2040 07/17/24  2348 07/18/24 0443 07/18/24 0700  BP: 113/71 (!) 106/59  (!) 98/56  Pulse:      Resp:      Temp: 97.8 F (36.6 C) 97.7 F (36.5 C) 97.7 F (36.5 C) 98 F (36.7 C)  TempSrc: Oral Oral Oral Oral  SpO2:      Weight:      Height:        Intake/Output Summary (Last 24 hours) at 07/18/2024 1157 Last data filed at 07/18/2024 0506 Gross per 24 hour  Intake --  Output 1500 ml  Net -1500 ml   Filed Weights   07/10/24 1755 07/11/24 2013  Weight: 64 kg 67 kg    Examination:  Awake Alert, Oriented X 3, No new F.N deficits, Normal affect Symmetrical Chest wall movement, Good air movement bilaterally, CTAB RRR,No Gallops,Rubs or new Murmurs, No Parasternal Heave +ve B.Sounds, Abd Soft, No tenderness, No rebound - guarding or rigidity. No Cyanosis, Clubbing or edema, No new Rash or bruise        Data Reviewed: I have personally reviewed following labs and imaging studies  CBC: Recent Labs  Lab 07/14/24 0516 07/15/24 0418 07/16/24 0258 07/17/24 0401 07/18/24 0325  WBC 11.5* 9.2 9.7 9.0 7.8  NEUTROABS 9.7* 7.5 7.8* 7.4 6.4  HGB 9.1* 9.2* 8.8* 8.8* 9.2*  HCT 29.0* 29.0* 28.0* 27.3* 29.2*  MCV 94.5 92.9 93.6 93.2 94.2  PLT 328 306 332 294 322    Basic Metabolic Panel: Recent Labs  Lab 07/14/24 0516 07/15/24 0418 07/16/24 0258 07/17/24 0401 07/18/24 0325  NA 137 134* 135 132* 134*  K 3.4* 3.7 3.6 3.7 3.9  CL 98 98 100 100 100  CO2 25 25 25 24 25   GLUCOSE 159* 109* 107* 107* 103*  BUN 14 15 12 11 14   CREATININE 0.90 0.77 0.71 0.67 0.65  CALCIUM  7.7* 7.9* 7.8* 7.8* 7.9*    GFR: Estimated Creatinine Clearance: 61.6 mL/min (by C-G formula based on SCr of 0.65 mg/dL).  Liver Function Tests: Recent Labs  Lab 07/12/24 2057 07/13/24 0538 07/14/24 0516 07/15/24 0418  AST 16 15 17 17   ALT 13 13 12 13   ALKPHOS 60 57 56 52  BILITOT 0.4 0.4 0.3 0.4  PROT 5.8* 5.6* 5.7* 6.1*  ALBUMIN <1.5*  <1.5* <1.5* 1.5*    CBG: No results for input(s): GLUCAP in the last 168 hours.   Recent Results (from the past 240 hours)  Blood Culture (routine x 2)     Status: None   Collection Time: 07/10/24  7:05 PM   Specimen: BLOOD  Result Value Ref Range Status   Specimen Description BLOOD RIGHT ANTECUBITAL  Final   Special Requests   Final    BOTTLES DRAWN AEROBIC AND ANAEROBIC Blood Culture adequate volume   Culture   Final    NO GROWTH 5 DAYS Performed at [facility], [address] [place], [place] [address]    Report Status 07/15/2024 FINAL  Final  Blood Culture (routine x 2)     Status: None   Collection Time: 07/10/24  7:10 PM   Specimen: BLOOD RIGHT FOREARM  Result Value Ref Range Status   Specimen Description BLOOD RIGHT FOREARM  Final   Special Requests   Final    BOTTLES DRAWN AEROBIC AND ANAEROBIC Blood Culture adequate volume   Culture   Final    NO GROWTH 5 DAYS Performed at [facility], [address], [place], [place] [address]    Report Status  07/15/2024 FINAL  Final  Urine Culture (for pregnant, neutropenic or urologic patients or patients with an indwelling urinary catheter)     Status: Abnormal   Collection Time: 07/11/24 12:47 AM   Specimen: Urine, Clean Catch  Result Value Ref Range Status   Specimen Description URINE, CLEAN CATCH  Final   Special Requests   Final    Immunocompromised Performed at [facility], [address], [place], [place] [address]    Culture >=100,000 COLONIES/mL PROTEUS MIRABILIS (A)  Final   Report Status 07/13/2024 FINAL  Final   Organism ID, Bacteria PROTEUS MIRABILIS (A)  Final      Susceptibility   Proteus mirabilis - MIC*    AMPICILLIN <=2 SENSITIVE Sensitive     CEFAZOLIN (URINE) Value in next row Sensitive      <=1 SENSITIVEThis is a modified FDA-approved test that has been validated and its performance characteristics determined by the reporting laboratory.  This laboratory is certified under the  Clinical Laboratory Improvement Amendments CLIA as qualified to perform high complexity clinical laboratory testing.    CEFEPIME  Value in next row Sensitive      <=1 SENSITIVEThis is a modified FDA-approved test that has been validated and its performance characteristics determined by the reporting laboratory.  This laboratory is certified under the Clinical Laboratory Improvement Amendments CLIA as qualified to perform high complexity clinical laboratory testing.    ERTAPENEM Value in next row Sensitive      <=1 SENSITIVEThis is a modified FDA-approved test that has been validated and its performance characteristics determined by the reporting laboratory.  This laboratory is certified under the Clinical Laboratory Improvement Amendments CLIA as qualified to perform high complexity clinical laboratory testing.    CEFTRIAXONE  Value in next row Sensitive      <=1 SENSITIVEThis is a modified FDA-approved test that has been validated and its performance characteristics determined by the reporting laboratory.  This laboratory is certified under the Clinical Laboratory Improvement Amendments CLIA as qualified to perform high complexity clinical laboratory testing.    CIPROFLOXACIN  Value in next row Sensitive      <=1 SENSITIVEThis is a modified FDA-approved test that has been validated and its performance characteristics determined by the reporting laboratory.  This laboratory is certified under the Clinical Laboratory Improvement Amendments CLIA as qualified to perform high complexity clinical laboratory testing.    GENTAMICIN  Value in next row Sensitive      <=1 SENSITIVEThis is a modified FDA-approved test that has been validated and its performance characteristics determined by the reporting laboratory.  This laboratory is certified under the Clinical Laboratory Improvement Amendments CLIA as qualified to perform high complexity clinical laboratory testing.    NITROFURANTOIN Value in next row Resistant       <=1 SENSITIVEThis is a modified FDA-approved test that has been validated and its performance characteristics determined by the reporting laboratory.  This laboratory is certified under the Clinical Laboratory Improvement Amendments CLIA as qualified to perform high complexity clinical laboratory testing.    TRIMETH/SULFA Value in next row Sensitive      <=1 SENSITIVEThis is a modified FDA-approved test that has been validated and its performance characteristics determined by the reporting laboratory.  This laboratory is certified under the Clinical Laboratory Improvement Amendments CLIA as qualified to perform high complexity clinical laboratory testing.    AMPICILLIN/SULBACTAM Value in next row Sensitive      <=1 SENSITIVEThis is a modified FDA-approved test that has been validated and its performance characteristics determined  by the reporting laboratory.  This laboratory is certified under the Clinical Laboratory Improvement Amendments CLIA as qualified to perform high complexity clinical laboratory testing.    PIP/TAZO Value in next row Sensitive      <=4 SENSITIVEThis is a modified FDA-approved test that has been validated and its performance characteristics determined by the reporting laboratory.  This laboratory is certified under the Clinical Laboratory Improvement Amendments CLIA as qualified to perform high complexity clinical laboratory testing.    MEROPENEM Value in next row Sensitive      <=4 SENSITIVEThis is a modified FDA-approved test that has been validated and its performance characteristics determined by the reporting laboratory.  This laboratory is certified under the Clinical Laboratory Improvement Amendments CLIA as qualified to perform high complexity clinical laboratory testing.    * >=100,000 COLONIES/mL PROTEUS MIRABILIS  Aerobic/Anaerobic Culture w Gram Stain (surgical/deep wound)     Status: None   Collection Time: 07/12/24  3:09 PM   Specimen: Abscess  Result Value Ref  Range Status   Specimen Description ABSCESS  Final   Special Requests LLQ  Final   Gram Stain   Final    ABUNDANT WBC PRESENT, PREDOMINANTLY PMN RARE GRAM POSITIVE COCCI IN CLUSTERS    Culture   Final    MODERATE PROTEUS MIRABILIS FEW AEROCOCCUS SPECIES Standardized susceptibility testing for this organism is not available. Performed at [facility], [address], [place], [place] [address]    Report Status 07/17/2024 FINAL  Final   Organism ID, Bacteria PROTEUS MIRABILIS  Final      Susceptibility   Proteus mirabilis - MIC*    AMPICILLIN <=2 SENSITIVE Sensitive     CEFAZOLIN (NON-URINE) 4 INTERMEDIATE Intermediate     CEFEPIME  <=0.12 SENSITIVE Sensitive     ERTAPENEM <=0.12 SENSITIVE Sensitive     CEFTRIAXONE  <=0.25 SENSITIVE Sensitive     CIPROFLOXACIN  <=0.06 SENSITIVE Sensitive     GENTAMICIN  <=1 SENSITIVE Sensitive     MEROPENEM <=0.25 SENSITIVE Sensitive     TRIMETH/SULFA <=20 SENSITIVE Sensitive     AMPICILLIN/SULBACTAM <=2 SENSITIVE Sensitive     PIP/TAZO Value in next row Sensitive      <=4 SENSITIVEThis is a modified FDA-approved test that has been validated and its performance characteristics determined by the reporting laboratory.  This laboratory is certified under the Clinical Laboratory Improvement Amendments CLIA as qualified to perform high complexity clinical laboratory testing.    * MODERATE PROTEUS MIRABILIS         Radiology Studies: CT CYSTOGRAM ABD/PELVIS Result Date: 07/17/2024 CLINICAL DATA:  Pelvic abscess with possible communication between the bladder and the abscess. EXAM: CT CYSTOGRAM (CT ABDOMEN AND PELVIS WITH CONTRAST) TECHNIQUE: Multi-detector CT imaging through the abdomen and pelvis was performed after dilute contrast had been introduced into the bladder for the purposes of performing CT cystography. RADIATION DOSE REDUCTION: This exam was performed according to the departmental dose-optimization program which includes automated  exposure control, adjustment of the mA and/or kV according to patient size and/or use of iterative reconstruction technique. CONTRAST:  50mL OMNIPAQUE  IOHEXOL  300 MG/ML  SOLN COMPARISON:  MRI 07/16/2024. FINDINGS: Lower chest: Bronchiectasis with architectural distortion and atelectasis/scarring noted in both lung bases, right greater. Hepatobiliary: No suspicious focal abnormality in the liver on this study without intravenous contrast. There is no evidence for gallstones, gallbladder wall thickening, or pericholecystic fluid. No intrahepatic or extrahepatic biliary dilation. Pancreas: No focal mass lesion. No dilatation of the main duct. No intraparenchymal cyst. No peripancreatic  edema. Spleen: No splenomegaly. No suspicious focal mass lesion. Adrenals/Urinary Tract: No adrenal nodule or mass. Mild fullness noted right ureter without overt right hydroureteronephrosis. There is mild left hydroureteronephrosis. Contrast material in the bladder has been introduced in a retrograde fashion. Bladder is poorly distended but there is evidence for extraperitoneal extravasation of contrast into the pelvic floor with contrast material tracking anteriorly in the pelvis through the region of the symphysis pubis into a thick walled collection of fluid, gas, and extravasated contrast in the anterior pelvic wall corresponding to the described abscess on CT of 07/10/2024. the fistula between the bladder seems to tract just caudal to the symphysis pubis although it may also tract directly through the symphysis pubis itself. Stomach/Bowel: Stomach is unremarkable. No gastric wall thickening. No evidence of outlet obstruction. Duodenum is normally positioned as is the ligament of Treitz. No small bowel wall thickening. No small bowel dilatation. The terminal ileum is normal. The appendix is normal. No gross colonic mass. No colonic wall thickening. Moderate stool volume evident. Vascular/Lymphatic: There is moderate atherosclerotic  calcification of the abdominal aorta without aneurysm. There is no gastrohepatic or hepatoduodenal ligament lymphadenopathy. No retroperitoneal or mesenteric lymphadenopathy. No pelvic sidewall lymphadenopathy. Reproductive: Prostate gland not well seen. Other: No substantial intraperitoneal free fluid. Musculoskeletal: Diffuse body wall edema evident. There is erosion bony anatomy in the pubic bones bilaterally, left greater than right, raising concern for metastatic disease and/or osteomyelitis. Compression deformity noted at T11 and T12. IMPRESSION: 1. Extraperitoneal extravasation of contrast at the bladder base into the pelvic floor with contrast material tracking anteriorly in the pelvis through the region of the symphysis pubis into a thick walled collection of fluid, gas, and extravasated contrast in the anterior pelvic wall corresponding to the described abscess on CT of 07/10/2024. 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 2. Erosion of the bony anatomy in the pubic bones bilaterally, left greater than right, raising concern for metastatic disease and/or osteomyelitis. This was better characterized on recent MRI of the pelvis. 3. Mild left hydroureteronephrosis. 4. Bronchiectasis with architectural distortion and atelectasis/scarring in both lung bases, right greater than left. 5. Diffuse body wall edema. 6. Compression deformity at T11 and T12. 7.  Aortic Atherosclerosis (ICD10-I70.0). Electronically Signed   By: Camellia Candle M.D.   On: 07/17/2024 12:50   MR PELVIS W WO CONTRAST Result Date: 07/17/2024 CLINICAL DATA:  Prior radiation therapy to the pelvis for prostate cancer. Cortical erosions of the pubis. History pelvic/prostate radiotherapy in 2003, salvage SBRT to the left seminal vesicle and T12 in 2022, and more recently SP RT to a left pubic ramus metastasis in 2023. EXAM: MRI PELVIS WITHOUT AND WITH CONTRAST  TECHNIQUE: Multiplanar multisequence MR imaging of the pelvis was performed both before and after administration of intravenous contrast. CONTRAST:  6.5mL GADAVIST GADOBUTROL 1 MMOL/ML IV SOLN COMPARISON:  CT scan 07/10/2024 FINDINGS: Urinary Tract: Mild distal hydroureter. Irregular and thick-walled urinary bladder with enhancing margins compatible with cystitis, with a Foley catheter in place. There is some mixed signal intensity material inferiorly in the urinary bladder below the Foley catheter balloon, and suspected continuity of the anterior wall of the urinary bladder with the pubic symphysis on image 36 series 11. The fluid in the pubic symphysis itself is questionably continuous with an apparent abscess of the rectus abdominus musculature (left greater than right) and linea alba measuring about 7.1 by 2.4 by 8.3 cm (volume = 74  cm^3), with internal gas and fluid density and prominently enhancing margins. If clinically warranted, cystogram conducted through the Foley catheter could be utilized to assess for confirm continuity of the urinary bladder with the pubic symphysis and anterior abdominal wall abscess. Bowel: Moderate lower rectal wall thickening, radiation proctitis not excluded. Vascular/Lymphatic: Unremarkable Reproductive:  Recognizable prostate gland not identified. Other: Low-level edema tracks along the iliacus and gluteal musculature. More striking edema along the hip adductor musculature and pelvic sidewall musculature could reflect myositis. Musculoskeletal: Bony erosions in the pubic bodies along the pubic symphysis with associated marrow edema in the pubic bodies and adjacent pubic rami favoring osteomyelitis. Enhancing lesion favoring a metastatic deposit in the left ischial tuberosity measuring 2.8 cm in diameter on image 40 series 7. Bandlike edema with central low signal T2 intensity in the right iliac bone adjacent to the SI joint measuring 4.4 by 1.5 cm on image 19 series 6,  metastatic lesion not excluded. 1.5 cm in diameter lesion with high T2 and low T1 signal in the left femoral head and neck on image 24 series 3, metastatic lesion not excluded. Lower lumbar spondylosis and degenerative disc disease. IMPRESSION: 1. Bony erosions in the pubic bodies along the pubic symphysis with associated marrow edema in the pubic bodies and adjacent pubic rami favoring osteomyelitis. 2. Suspected continuity of the anterior lumen of the urinary bladder with the pubic symphysis and anterior abdominal wall abscess. if clinically warranted, cystogram through the Foley catheter could be utilized to assess for continuity of the urinary bladder with the pubic symphysis and anterior abdominal wall abscess. 3. Enhancing lesion favoring a metastatic deposit in the left ischial tuberosity. 4. Bandlike edema with central low signal T2 intensity in the right iliac bone adjacent to the SI joint, metastatic lesion not excluded. 5. 1.5 cm in diameter lesion with high T2 and low T1 signal in the left femoral head and neck, metastatic lesion not excluded. 6. Moderate lower rectal wall thickening, radiation proctitis not excluded. 7. Mild distal hydroureter. 8. Low-level edema tracks along the iliacus and gluteal musculature. More striking edema along the hip adductor musculature and pelvic sidewall musculature could reflect myositis. Electronically Signed   By: Ryan Salvage M.D.   On: 07/17/2024 11:43        Scheduled Meds:  Chlorhexidine  Gluconate Cloth  6 each Topical Daily   enoxaparin (LOVENOX) injection  40 mg Subcutaneous Q24H   feeding supplement  237 mL Oral TID BM   ferrous sulfate   325 mg Oral Q breakfast   midodrine   10 mg Oral TID WC   pantoprazole   40 mg Oral Daily   pentoxifylline  400 mg Oral TID WC   pravastatin   20 mg Oral QPM   sodium chloride  flush  3 mL Intravenous Q12H   sodium chloride  flush  3 mL Intravenous Q12H   Continuous Infusions:  cefTRIAXone  (ROCEPHIN )  IV 2 g  (07/17/24 1237)   vitamin E capsule 1,000 Units       LOS: 7 days      Brayton Lye, MD [facility]   To contact the attending provider between 7A-7P or the covering provider during after hours 7P-7A, please log into the web site [email or web address] and access using universal [place] password for that web site. If you do not have the password, please call the hospital operator.  07/18/2024, 11:57 AM

## 2024-07-18 NOTE — Progress Notes (Signed)
 Subjective: Patient was in good spirits this morning.  His brother accompanied him at the bedside.  We reviewed the CT findings from yesterday and discussed long-term plan.  All questions were answered to their satisfaction.  Objective: Vital signs in last 24 hours: Temp:  [97.7 F (36.5 C)-98.5 F (36.9 C)] 98.5 F (36.9 C) (10/16 1216) Pulse Rate:  [84] 84 (10/16 1216) Resp:  [18] 18 (10/16 1216) BP: (98-113)/(53-71) 105/53 (10/16 1216) SpO2:  [94 %] 94 % (10/16 1216)  Assessment/Plan: # Metastatic prostate cancer # Dystrophic prostatic calcifications # Diffuse bladder wall thickening # Bladder/pubic symphysis/abdominal fluid collection fistula  Reviewed case and plan with patient and discussed that considering his multiple rounds of pelvic radiation and advanced age, there is high likelihood of severe complication with attempted surgical intervention.  Shared decision was made to proceed conservatively with long-term bladder decompression while working with infectious disease on antibiotic regimen.  Resume Orgovyx and Xtandi .  Follow-up with Dr. Nieves as scheduled.  Okay to discharge once medically clear.   Intake/Output from previous day: 10/15 0701 - 10/16 0700 In: -  Out: 3100 [Urine:3100]  Intake/Output this shift: No intake/output data recorded.  Physical Exam:  General: Alert and oriented CV: No cyanosis Lungs: equal chest rise Abdomen: Soft, NTND, no rebound or guarding Gu: Foley catheter in place draining clear yellow urine with thick white sediment  Lab Results: Recent Labs    07/16/24 0258 07/17/24 0401 07/18/24 0325  HGB 8.8* 8.8* 9.2*  HCT 28.0* 27.3* 29.2*   BMET Recent Labs    07/17/24 0401 07/18/24 0325  NA 132* 134*  K 3.7 3.9  CL 100 100  CO2 24 25  GLUCOSE 107* 103*  BUN 11 14  CREATININE 0.67 0.65  CALCIUM  7.8* 7.9*  HGB 8.8* 9.2*  WBC 9.0 7.8     Studies/Results: CT CYSTOGRAM ABD/PELVIS Result Date:  07/17/2024 CLINICAL DATA:  Pelvic abscess with possible communication between the bladder and the abscess. EXAM: CT CYSTOGRAM (CT ABDOMEN AND PELVIS WITH CONTRAST) TECHNIQUE: Multi-detector CT imaging through the abdomen and pelvis was performed after dilute contrast had been introduced into the bladder for the purposes of performing CT cystography. RADIATION DOSE REDUCTION: This exam was performed according to the departmental dose-optimization program which includes automated exposure control, adjustment of the mA and/or kV according to patient size and/or use of iterative reconstruction technique. CONTRAST:  50mL OMNIPAQUE  IOHEXOL  300 MG/ML  SOLN COMPARISON:  MRI 07/16/2024. FINDINGS: Lower chest: Bronchiectasis with architectural distortion and atelectasis/scarring noted in both lung bases, right greater. Hepatobiliary: No suspicious focal abnormality in the liver on this study without intravenous contrast. There is no evidence for gallstones, gallbladder wall thickening, or pericholecystic fluid. No intrahepatic or extrahepatic biliary dilation. Pancreas: No focal mass lesion. No dilatation of the main duct. No intraparenchymal cyst. No peripancreatic edema. Spleen: No splenomegaly. No suspicious focal mass lesion. Adrenals/Urinary Tract: No adrenal nodule or mass. Mild fullness noted right ureter without overt right hydroureteronephrosis. There is mild left hydroureteronephrosis. Contrast material in the bladder has been introduced in a retrograde fashion. Bladder is poorly distended but there is evidence for extraperitoneal extravasation of contrast into the pelvic floor with contrast material tracking anteriorly in the pelvis through the region of the symphysis pubis into a thick walled collection of fluid, gas, and extravasated contrast in the anterior pelvic wall corresponding to the described abscess on CT of 07/10/2024. the fistula between the bladder seems to tract just caudal to the symphysis  pubis  although it may also tract directly through the symphysis pubis itself. Stomach/Bowel: Stomach is unremarkable. No gastric wall thickening. No evidence of outlet obstruction. Duodenum is normally positioned as is the ligament of Treitz. No small bowel wall thickening. No small bowel dilatation. The terminal ileum is normal. The appendix is normal. No gross colonic mass. No colonic wall thickening. Moderate stool volume evident. Vascular/Lymphatic: There is moderate atherosclerotic calcification of the abdominal aorta without aneurysm. There is no gastrohepatic or hepatoduodenal ligament lymphadenopathy. No retroperitoneal or mesenteric lymphadenopathy. No pelvic sidewall lymphadenopathy. Reproductive: Prostate gland not well seen. Other: No substantial intraperitoneal free fluid. Musculoskeletal: Diffuse body wall edema evident. There is erosion bony anatomy in the pubic bones bilaterally, left greater than right, raising concern for metastatic disease and/or osteomyelitis. Compression deformity noted at T11 and T12. IMPRESSION: 1. Extraperitoneal extravasation of contrast at the bladder base into the pelvic floor with contrast material tracking anteriorly in the pelvis through the region of the symphysis pubis into a thick walled collection of fluid, gas, and extravasated contrast in the anterior pelvic wall corresponding to the described abscess on CT of 07/10/2024. 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 2. Erosion of the bony anatomy in the pubic bones bilaterally, left greater than right, raising concern for metastatic disease and/or osteomyelitis. This was better characterized on recent MRI of the pelvis. 3. Mild left hydroureteronephrosis. 4. Bronchiectasis with architectural distortion and atelectasis/scarring in both lung bases, right greater than left. 5. Diffuse body wall edema. 6. Compression deformity at T11 and T12. 7.   Aortic Atherosclerosis (ICD10-I70.0). Electronically Signed   By: Camellia Candle M.D.   On: 07/17/2024 12:50   MR PELVIS W WO CONTRAST Result Date: 07/17/2024 CLINICAL DATA:  Prior radiation therapy to the pelvis for prostate cancer. Cortical erosions of the pubis. History pelvic/prostate radiotherapy in 2003, salvage SBRT to the left seminal vesicle and T12 in 2022, and more recently SP RT to a left pubic ramus metastasis in 2023. EXAM: MRI PELVIS WITHOUT AND WITH CONTRAST TECHNIQUE: Multiplanar multisequence MR imaging of the pelvis was performed both before and after administration of intravenous contrast. CONTRAST:  6.5mL GADAVIST GADOBUTROL 1 MMOL/ML IV SOLN COMPARISON:  CT scan 07/10/2024 FINDINGS: Urinary Tract: Mild distal hydroureter. Irregular and thick-walled urinary bladder with enhancing margins compatible with cystitis, with a Foley catheter in place. There is some mixed signal intensity material inferiorly in the urinary bladder below the Foley catheter balloon, and suspected continuity of the anterior wall of the urinary bladder with the pubic symphysis on image 36 series 11. The fluid in the pubic symphysis itself is questionably continuous with an apparent abscess of the rectus abdominus musculature (left greater than right) and linea alba measuring about 7.1 by 2.4 by 8.3 cm (volume = 74 cm^3), with internal gas and fluid density and prominently enhancing margins. If clinically warranted, cystogram conducted through the Foley catheter could be utilized to assess for confirm continuity of the urinary bladder with the pubic symphysis and anterior abdominal wall abscess. Bowel: Moderate lower rectal wall thickening, radiation proctitis not excluded. Vascular/Lymphatic: Unremarkable Reproductive:  Recognizable prostate gland not identified. Other: Low-level edema tracks along the iliacus and gluteal musculature. More striking edema along the hip adductor musculature and pelvic sidewall musculature  could reflect myositis. Musculoskeletal: Bony erosions in the pubic bodies along the pubic symphysis with associated marrow edema in the pubic bodies and adjacent pubic rami favoring osteomyelitis. Enhancing  lesion favoring a metastatic deposit in the left ischial tuberosity measuring 2.8 cm in diameter on image 40 series 7. Bandlike edema with central low signal T2 intensity in the right iliac bone adjacent to the SI joint measuring 4.4 by 1.5 cm on image 19 series 6, metastatic lesion not excluded. 1.5 cm in diameter lesion with high T2 and low T1 signal in the left femoral head and neck on image 24 series 3, metastatic lesion not excluded. Lower lumbar spondylosis and degenerative disc disease. IMPRESSION: 1. Bony erosions in the pubic bodies along the pubic symphysis with associated marrow edema in the pubic bodies and adjacent pubic rami favoring osteomyelitis. 2. Suspected continuity of the anterior lumen of the urinary bladder with the pubic symphysis and anterior abdominal wall abscess. if clinically warranted, cystogram through the Foley catheter could be utilized to assess for continuity of the urinary bladder with the pubic symphysis and anterior abdominal wall abscess. 3. Enhancing lesion favoring a metastatic deposit in the left ischial tuberosity. 4. Bandlike edema with central low signal T2 intensity in the right iliac bone adjacent to the SI joint, metastatic lesion not excluded. 5. 1.5 cm in diameter lesion with high T2 and low T1 signal in the left femoral head and neck, metastatic lesion not excluded. 6. Moderate lower rectal wall thickening, radiation proctitis not excluded. 7. Mild distal hydroureter. 8. Low-level edema tracks along the iliacus and gluteal musculature. More striking edema along the hip adductor musculature and pelvic sidewall musculature could reflect myositis. Electronically Signed   By: Ryan Salvage M.D.   On: 07/17/2024 11:43      LOS: 7 days   Ole Bourdon, NP [facility] Pager: [phone]  07/18/2024, 1:00 PM

## 2024-07-18 NOTE — Evaluation (Signed)
 Physical Therapy Brief Evaluation and Discharge Note Patient Details Name: Ronald Kaiser MRN: [id number] DOB: May 18, 1934 Today's Date: 07/18/2024   History of Present Illness  89 year old white male on 10/8 brought to ED with lower abdominal pain radiating down to his legs over the past 7 to 10 days improves when he does not move or do any exercises associated with shortness of breath occasionally--- he was found to have rectus sheath hematoma/abscess/other on the CT angio as dictated below for this indication because of severe pain. PMH: s/p colonoscopy and EGD 8/26, prostate Cancer, MI, HFrEF, HTN, HLD, CAD  Clinical Impression  Pt presents with admitting diagnosis above. Pt today was able to ambulate in hallway with rollator at supervision level. PTA pt reports he was Mod I with rollator/furniture walking. Pt presents at or near baseline mobility. Pt has no further acute PT needs and will be signing off. Re consult PT if mobility status changes. Pt would benefit from continued mobility with mobility specialist during acute stay.        PT Assessment Patient does not need any further PT services  Assistance Needed at Discharge  PRN    Equipment Recommendations None recommended by PT  Recommendations for Other Services       Precautions/Restrictions Precautions Precautions: Fall Recall of Precautions/Restrictions: Intact Restrictions Weight Bearing Restrictions Per Provider Order: No        Mobility  Bed Mobility   Supine/Sidelying to sit: Modified independent (Device/Increased time) Sit to supine/sidelying: Modified independent (Device/Increased time)    Transfers Overall transfer level: Independent Equipment used: Rollator (4 wheels)                    Ambulation/Gait Ambulation/Gait assistance: Supervision Gait Distance (Feet): 300 Feet Assistive device: Rollator (4 wheels) Gait Pattern/deviations: Trunk flexed, Decreased stride length, Step-through  pattern Gait Speed: Pace WFL General Gait Details: 2 standing rest breaks. no LOB noted. Pt sta  Home Activity Instructions    Stairs            Modified Rankin (Stroke Patients Only)        Balance Overall balance assessment: Mild deficits observed, not formally tested                        Pertinent Vitals/Pain PT - Brief Vital Signs All Vital Signs Stable: Yes Pain Assessment Pain Assessment: No/denies pain     Home Living Family/patient expects to be discharged to:: Private residence Living Arrangements: Spouse/significant other Available Help at Discharge: Family;Available 24 hours/day Home Environment: Stairs to enter  [employer] of Steps: 1 Home Equipment: Rollator (4 wheels);Shower seat;Grab bars - tub/shower;Hand held shower head        Prior Function Level of Independence: Independent      UE/LE Assessment   UE ROM/Strength/Tone/Coordination: WFL    LE ROM/Strength/Tone/Coordination: [facility]      Communication   Communication Communication: Impaired Factors Affecting Communication: Hearing impaired     Cognition Overall Cognitive Status: Appears within functional limits for tasks assessed/performed       General Comments General comments (skin integrity, edema, etc.): VSS    Exercises     Assessment/Plan    PT Problem List         PT Visit Diagnosis Other abnormalities of gait and mobility (R26.89)    No Skilled PT Patient at baseline level of functioning;Patient is supervision for all activity/mobility   Co-evaluation  AMPAC 6 Clicks Help needed turning from your back to your side while in a flat bed without using bedrails?: None Help needed moving from lying on your back to sitting on the side of a flat bed without using bedrails?: None Help needed moving to and from a bed to a chair (including a wheelchair)?: None Help needed standing up from a chair using your arms (e.g., wheelchair or  bedside chair)?: None Help needed to walk in hospital room?: A Little Help needed climbing 3-5 steps with a railing? : A Little 6 Click Score: 22      End of Session Equipment Utilized During Treatment: Gait belt Activity Tolerance: Patient tolerated treatment well Patient left: in bed;with call bell/phone within reach Nurse Communication: Mobility status PT Visit Diagnosis: Other abnormalities of gait and mobility (R26.89)     Time: 8981-8958 PT Time Calculation (min) (ACUTE ONLY): 23 min  Charges:   PT Evaluation $PT Eval Moderate Complexity: 1 Mod PT Treatments $Gait Training: 8-22 mins    Jayleen Afonso B, PT, DPT Acute Rehab Services [phone]   Esmeralda Malay  07/18/2024, 12:27 PM

## 2024-07-18 NOTE — TOC Progression Note (Signed)
 Transition of Care [facility]) - Progression Note    Patient Details  Name: Ronald Kaiser MRN: [id number] Date of Birth: 1934-09-05  [facility] CM/SW Contact  Andrez JULIANNA George, RN Phone Number: 07/18/2024, 1:46 PM  Clinical Narrative:     Pt is going to need 6 weeks of IV abx. CM spoke to patient's spouse and she is unable to do this at home. She says they dont have anyone that could assist. She has macular degeneration and is unable to administer the medication. She is open to having pt faxed out in the [facility] area. Will provide bed offers as they become available.  IP Care management following.  Expected Discharge Plan: Skilled Nursing Facility Barriers to Discharge: Continued Medical Work up               Expected Discharge Plan and Services   Discharge Planning Services: CM Consult Post Acute Care Choice: Skilled Nursing Facility Living arrangements for the past 2 months: Single Family Home                                       Social Drivers of Health (SDOH) Interventions SDOH Screenings   Food Insecurity: Patient Declined (07/12/2024)  Housing: Unknown (07/12/2024)  Transportation Needs: Patient Declined (07/12/2024)  Utilities: Patient Declined (07/12/2024)  Social Connections: Patient Declined (07/12/2024)  Recent Concern: Social Connections - Moderately Isolated (06/07/2024)  Tobacco Use: Medium Risk (07/11/2024)    Readmission Risk Interventions    06/10/2024   10:37 AM  Readmission Risk Prevention Plan  Transportation Screening Complete  PCP or Specialist Appt within 3-5 Days Complete  HRI or Home Care Consult Complete  Palliative Care Screening Not Applicable  Medication Review (RN Care Manager) Complete

## 2024-07-18 NOTE — Plan of Care (Signed)

## 2024-07-18 NOTE — Progress Notes (Addendum)
 Initial Nutrition Assessment  DOCUMENTATION CODES:   Severe malnutrition in context of chronic illness  INTERVENTION:  Add Ensure Plus High Protein po BID, each supplement provides 350 kcal and 20 grams of protein  Add MVI w/ minerals Continue liberalized diet to maximize PO intake Add Magic cup TID with meals, each supplement provides 290 kcal and 9 grams of protein    NUTRITION DIAGNOSIS:   Severe Malnutrition related to chronic illness as evidenced by severe muscle depletion, severe fat depletion.  GOAL:   Patient will meet greater than or equal to 90% of their needs   MONITOR:   PO intake, Supplement acceptance  REASON FOR ASSESSMENT:   Consult Assessment of nutrition requirement/status  ASSESSMENT:   Pt with PMH significant for: metastatic lesions from prostate cancer on Xtandi , HTN, CAD, CHF. Has severe radiation proctitis w/ anemia. Permanent afib and not on anticoagulation. Presented with lower abdominal pain and found to have UTI/cystitis with abdominal wall rectus sheath abscess and metastatic prostate disease.   He has extensive history with prostate cancer with metastasis. For infection, will require six weeks of IV ABX via PICC. He is post aspiration of abscess by IR with culture growing pansensitive Proteus mirabilis. MRI completed on 10/14 concerning for surrounding osteomyelitis of pelvic rami and pubic area. ID following.   Average Meal Intake 10/13: 75% x 1 documented meal  Patient states intake was somewhat decreased prior to admission. He  has no issues chewing or swallowing. Tries to take in two Ensures per day in addition to his meals.   24-Hour Recall B: eggs, yogurt x2, cheerios w/ juice or buttermilk L: Ensure or skips or milk w/ grilled cheese sandwich and fruit; occasionally Pedialyte D: chicken w/ broccoli or green beans or cauliflower and peas Snacks: Ensure x1-2 daily  He is on regular diet. Bowels stable. Has pressure injury to his  vertebral column. Based off his observed muscle and fat depletions, he meets criteria for malnutrition in the context of chronic illness.   Admit Weight: 64 kg - appears pulled forward/stated Current Weight: 67 kg  States his recent UBW around 150lbs, which is around his current measured body weight. Endorses no significant weight loss in last six to twelve months. Per chart review, has shown 3% weight loss in last six months and gained weight in the last month and three months. Weight trends observed are not considered clinically significant for the time frames reviewed. No edema on exam.    Drain/Lines: Foley catheter UOP: 3100 ml x24 hours  Meds: ferrous sulfate , pantoprazole , vitamin E, IV ABX  Labs:  Na+ 134 (L) K+ 3.9 (wdl) CRP 7 (H) CBGs 103-107 x24 hours  NUTRITION - FOCUSED PHYSICAL EXAM:  Flowsheet Row Most Recent Value  Orbital Region Moderate depletion  Upper Arm Region Severe depletion  Thoracic and Lumbar Region Severe depletion  Buccal Region Severe depletion  Temple Region Moderate depletion  Clavicle Bone Region Severe depletion  Clavicle and Acromion Bone Region Severe depletion  Scapular Bone Region Severe depletion  Dorsal Hand Severe depletion  Patellar Region Severe depletion  Anterior Thigh Region Severe depletion  Posterior Calf Region Severe depletion  Edema (RD Assessment) None  Hair Reviewed  Eyes Reviewed  Mouth Reviewed  Skin Reviewed  Nails Reviewed    Diet Order:   Diet Order             Diet regular Room service appropriate? Yes; Fluid consistency: Thin  Diet effective now  EDUCATION NEEDS:   No education needs have been identified at this time  Skin:  Skin Assessment: Skin Integrity Issues: Skin Integrity Issues:: Other (Comment) Other: pressure injury to vertebral column  Last BM:  10/15 - type 4/6 x2  Height:  Ht Readings from Last 1 Encounters:  07/11/24 5' 11 (1.803 m)   Weight:  Wt Readings from Last 1  Encounters:  07/11/24 67 kg   Ideal Body Weight:  78.2 kg  BMI:  Body mass index is 20.6 kg/m.  Estimated Nutritional Needs:   Kcal:  2200-2400 kcal  Protein:  115-130g  Fluid:  >2L/day  Blair Deaner MS, RD, LDN Registered Dietitian Clinical Nutrition RD Inpatient Contact Info in Amion

## 2024-07-18 NOTE — [place] FL2 (Signed)
 [place]  MEDICAID FL2 LEVEL OF CARE FORM     IDENTIFICATION  Patient Name: Ronald Kaiser Birthdate: 02/26/1934 Sex: male Admission Date (Current Location): 07/10/2024  [facility] and [place] Number:  Producer, television/film/video and Address:  [facility], [address], [place], [place] [address]      Provider Number: [id number]  Attending Physician Name and Address:  Sherlon Brayton RAMAN, MD  Relative Name and Phone Number:  Terrick, Allred [phone]    Current Level of Care: Hospital Recommended Level of Care: Skilled Nursing Facility Prior Approval Number:    Date Approved/Denied:   PASRR Number: [id number]  Discharge Plan: SNF    Current Diagnoses: Patient Active Problem List   Diagnosis Date Noted   Abdominal wall abscess 07/11/2024   History of CAD (coronary artery disease) 07/11/2024   HFrEF (heart failure with reduced ejection fraction) (HCC) 07/11/2024   Sepsis (HCC) 07/11/2024   Elevated troponin 07/11/2024   Iron  deficiency anemia, unspecified 06/11/2024   Dizziness 06/07/2024   Paroxysmal atrial fibrillation (HCC) 05/30/2024   Radiation proctitis 05/28/2024   Gastric erythema 05/28/2024   Rectal bleeding 05/27/2024   Iron  deficiency anemia 05/27/2024   Symptomatic anemia 05/26/2024   Prostate cancer metastatic to bone (HCC) 02/26/2021   Bladder outlet obstruction 11/27/2018   Acute kidney injury    Hypokalemia    Other hydronephrosis    Acute on chronic systolic CHF (congestive heart failure) (HCC) 11/11/2018   AKI (acute kidney injury) 11/11/2018   Basal cell carcinoma (BCC) of skin of nose 01/24/2018   Abnormal nuclear cardiac imaging test    Actinic keratoses 01/13/2016   CHF (congestive heart failure) (HCC) 01/13/2016   Male erectile disorder 01/13/2016   Osteoarthritis 01/13/2016   Seborrheic keratoses 01/13/2016   Bruit 11/23/2012   JDMS (juvenile dermatomyositis) (HCC) 11/25/2011   Mixed hyperlipidemia 11/25/2011    Coronary arteriosclerosis 11/25/2011   Essential hypertension 11/25/2011   History of prostate cancer 10/03/2001    Orientation RESPIRATION BLADDER Height & Weight     Self, Time, Situation, Place  Normal Indwelling catheter Weight: 67 kg Height:  5' 11 (180.3 cm)  BEHAVIORAL SYMPTOMS/MOOD NEUROLOGICAL BOWEL NUTRITION STATUS      Incontinent Diet (regular with thin liquids)  AMBULATORY STATUS COMMUNICATION OF NEEDS Skin   Supervision Verbally Other (Comment) (redness to back/ sacrum/ spine)                       Personal Care Assistance Level of Assistance  Bathing, Feeding, Dressing Bathing Assistance: Limited assistance Feeding assistance: Independent Dressing Assistance: Limited assistance     Functional Limitations Info  Sight, Hearing, Speech Sight Info: Impaired Hearing Info: Impaired Speech Info: Adequate    SPECIAL CARE FACTORS FREQUENCY                       Contractures Contractures Info: Not present    Additional Factors Info  Code Status, Allergies Code Status Info: Full Allergies Info: Bactrim/ Augmentin/ Crestor / Zetia            Current Medications (07/18/2024):  This is the current hospital active medication list Current Facility-Administered Medications  Medication Dose Route Frequency Provider Last Rate Last Admin   acetaminophen  (TYLENOL ) tablet 650 mg  650 mg Oral Q6H PRN Sundil, Subrina, MD       Or   acetaminophen  (TYLENOL ) suppository 650 mg  650 mg Rectal Q6H PRN Sundil, Subrina, MD  cefTRIAXone  (ROCEPHIN ) 2 g in sodium chloride  0.9 % 100 mL IVPB  2 g Intravenous Q24H Samtani, Jai-Gurmukh, MD 200 mL/hr at 07/17/24 1237 2 g at 07/17/24 1237   Chlorhexidine  Gluconate Cloth 2 % PADS 6 each  6 each Topical Daily Sundil, Subrina, MD   6 each at 07/18/24 1000   enoxaparin (LOVENOX) injection 40 mg  40 mg Subcutaneous Q24H Paytes, Maurilio PENNER, RPH   40 mg at 07/18/24 9166   feeding supplement (ENSURE PLUS HIGH PROTEIN) liquid 237 mL   237 mL Oral TID BM Elgergawy, Dawood S, MD   237 mL at 07/18/24 1000   ferrous sulfate  tablet 325 mg  325 mg Oral Q breakfast Sundil, Subrina, MD   325 mg at 07/18/24 9167   HYDROcodone-acetaminophen  (NORCO/VICODIN) 5-325 MG per tablet 1 tablet  1 tablet Oral Q4H PRN Samtani, Jai-Gurmukh, MD   1 tablet at 07/12/24 2146   midodrine  (PROAMATINE ) tablet 10 mg  10 mg Oral TID WC Samtani, Jai-Gurmukh, MD   10 mg at 07/18/24 1213   morphine  (PF) 2 MG/ML injection 1 mg  1 mg Intravenous Q4H PRN Samtani, Jai-Gurmukh, MD   1 mg at 07/12/24 1358   ondansetron  (ZOFRAN ) tablet 4 mg  4 mg Oral Q6H PRN Sundil, Subrina, MD       Or   ondansetron  (ZOFRAN ) injection 4 mg  4 mg Intravenous Q6H PRN Sundil, Subrina, MD       pantoprazole  (PROTONIX ) EC tablet 40 mg  40 mg Oral Daily Elgergawy, Dawood S, MD   40 mg at 07/18/24 9167   pentoxifylline (TRENTAL) CR tablet 400 mg  400 mg Oral TID WC Elgergawy, Dawood S, MD   400 mg at 07/18/24 1213   pravastatin  (PRAVACHOL ) tablet 20 mg  20 mg Oral QPM Sundil, Subrina, MD   20 mg at 07/17/24 1736   sodium chloride  flush (NS) 0.9 % injection 3 mL  3 mL Intravenous Q12H Sundil, Subrina, MD   3 mL at 07/18/24 9166   sodium chloride  flush (NS) 0.9 % injection 3 mL  3 mL Intravenous Q12H Sundil, Subrina, MD   3 mL at 07/18/24 9166   sodium chloride  flush (NS) 0.9 % injection 3 mL  3 mL Intravenous PRN Sundil, Subrina, MD       vitamin E capsule 1,000 Units  1,000 Units Oral Daily Elgergawy, Dawood S, MD         Discharge Medications: Please see discharge summary for a list of discharge medications.  Relevant Imaging Results:  Relevant Lab Results:   Additional Information SS#: [id number]--Eu needs 6 weeks of IV Rocephin  2g IV daily  Henrry Feil F Jaylyn Booher, RN

## 2024-07-18 NOTE — Progress Notes (Signed)
   07/18/24 0830  Mobility  Activity Refused and notified nurse if applicable  Mobility Specialist Start Time (ACUTE ONLY) 0830   Mobility Specialist: Progress Note  Pt refused mobility session d/t wanting to wait until after breakfast mobilize. Pt refused to sit EOB to take meds. - Received and left in bed with all needs met. Call bell within reach. RN present. MS will follow up as time permits.   Virgle Boards, BS Mobility Specialist Please contact via SecureChat or Rehab office at [phone].

## 2024-07-18 NOTE — Progress Notes (Incomplete)
OPAT

## 2024-07-18 NOTE — Consult Note (Addendum)
 I have seen and examined the patient. I have personally reviewed the clinical findings, laboratory findings, microbiological data and imaging studies. The assessment and treatment plan was discussed with the Nurse Practitioner. I agree with her/his recommendations except following additions/corrections.  89 year old with history of metastatic prostate cancer on Xtandi , followed by urology Dr. Nieves, on Orgovyx and Xtandi  complicated with radiation proctitis, recent admission for septic shock/possible UTI with left rectal wall hematoma who initially presented with lower abdominal pain with radiating to legs.   Imaging with  Low anterior rectus abdominus collection with surrounding stranding likely representing abscess.  S/p ultrasound-guided aspiration of left anterior pelvic body wall collection. Cx Proteus mirabilis.  Urine culture Proteus  mirabilis and aerococcus.  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  Also concern for possible bony metastatic disease versus radiation changes  Plan - Plan to continue 6 weeks of IV ceftriaxone  from 10/10 with potential consideration for suppression has been considered a nonsurgical candidate - Needs PICC - Will need SNF for IV antibiotics due to poor support at home.  Engage social work.  - Monitor CBC, CMP, ESR and CRP - Follow-up with urology - Universal/standard isolation precaution ID will sign off, recall back with questions or concerns D/w primary team  OPAT  Diagnosis: Lower abdominal wall abscess, pubic symphysis osteomyelitis  Culture Result: Proteus mirabilis  Allergies  Allergen Reactions   Bactrim [Sulfamethoxazole-Trimethoprim] Hives, Shortness Of Breath and Swelling   Augmentin [Amoxicillin-Pot Clavulanate] Diarrhea   Crestor  [Rosuvastatin  Calcium ] Other (See Comments)    Back pain Grogginess   Dizziness    Zetia  [Ezetimibe ] Other (See Comments)    Back pain Grogginess  Dizziness     OPAT Orders Discharge antibiotics to be given via PICC line Discharge antibiotics: Ceftriaxone  2 g IV daily Per pharmacy protocol  Duration: 6 weeks  End Date: 11/21  [facility] Care Per Protocol:  Home health RN for IV administration and teaching; PICC line care and labs.    Labs weekly while on IV antibiotics: X__ CBC with differential __ BMP X__ CMP X__ CRP X__ ESR __ Vancomycin  trough __ CK  X__ Please pull PIC at completion of IV antibiotics __ Please leave PIC in place until doctor has seen patient or been notified  Fax weekly labs to [phone]  Clinic Follow Up Appt: 11/20 at 10: 45 am  I spent 70 minutes involved in face-to-face and non-face-to-face activities for this patient on the day of the visit. Professional time spent includes the following activities: preparing to see the patient (review of tests), obtaining and reviewing separately obtained history (H&P, hospitalist progress note, surgery note, urology note,, IR note), performing a medically appropriate examination, ordering medications, communicating with other health care professionals, documenting clinical information in the EMR, communicating results and counseling patient and family member regarding medication and plan of care, and care coordination.     [facility]    Date of Admission:  07/10/2024     Total days of antibiotics 9               Reason for Consult: Intra-abdominal abscess   Referring Provider: Dr. Sherlon  Primary Care Provider: [name], [name]   ASSESSMENT:  Ronald Kaiser is an 89 year old Caucasian male with prostate cancer admitted with lower abdominal pain and bilateral leg weakness and found to have an abdominal wall  abscess and pelvic osteomyelitis with fistulous tract between the bladder and an abdominal wall abscess.  Blood cultures without  evidence of bacteremia and abscess aspiration growing Proteus mirabilis and Aerococcus species.  Not currently a surgical candidate with recommendations for antibiotic treatment and suppression.  Ronald Kaiser is tolerating antibiotics with no adverse side effects.  Surgical specimen cultures reviewed.  Discussed plan of care for 6 weeks of antimicrobial therapy with ceftriaxone  for pelvic osteomyelitis, abdominal wall abscess, and fistula.  Unfortunately without surgical intervention he is at high risk for reaccumulation of abscess and continued infection. Wife updated about plan of care per his request and she expressed concern about her ability to care for him at home given her own medical conditions and his recent issues with memory. Have discussed with Primary Team and will work on disposition. For now continue with current dose of ceftriaxone  and will plan for PICC placement once disposition is determined.  Standard/universal precautions. Remaining medical and supportive care per Internal Medicine.   PLAN:  Continue current dose of ceftriaxone .  PICC line placement pending determination of disposition. Prolonged course of antibiotics followed by consideration for suppression given fistula.  Standard/universal precautions.  Remaining medical and supportive care per Internal Medicine.   Principal Problem:   Abdominal wall abscess Active Problems:   Essential hypertension   Prostate cancer metastatic to bone (HCC)   Paroxysmal atrial fibrillation (HCC)   History of CAD (coronary artery disease)   HFrEF (heart failure with reduced ejection fraction) (HCC)   Sepsis (HCC)   Elevated troponin    Chlorhexidine  Gluconate Cloth  6 each Topical Daily   enoxaparin (LOVENOX) injection  40 mg Subcutaneous Q24H   feeding supplement  237 mL Oral TID BM   ferrous sulfate   325 mg Oral Q breakfast   midodrine   10 mg Oral TID WC   pantoprazole   40 mg Oral Daily   pentoxifylline  400 mg Oral TID WC    pravastatin   20 mg Oral QPM   sodium chloride  flush  3 mL Intravenous Q12H   sodium chloride  flush  3 mL Intravenous Q12H     HPI: Ronald Kaiser is a 89 y.o. male with previous medical history of congestive heart failure, hypertension, and prostate cancer presenting to the hospital with lower abdominal pain and bilateral leg weakness.  Ronald Kaiser presented to the hospital with 1 to 2 weeks of lower abdominal pain aggravated by therapy.  Initially afebrile with leukocytosis of 18,800.  CT on 07/10/2024 with increasing soft tissue mass/infiltration of low pelvis and low anterior rectus abdominis collection with surrounding stranding likely representing abscess.  Aspirated on 07/12/2024.  MRI pelvis with bony erosions in the pubic bodies along the pubic symphysis concerning for osteomyelitis and suspected continuity of the anterior lumen of the urinary bladder with the pubic symphysis and anterior abdominal wall abscess.  Cystogram completed on 07/17/24 with contrast material tracking anteriorly into the pelvis through the region of the symphysis pubis into the thick-walled collection of fluid, gas, and extubated contrast in the anterior pelvic wall corresponding to CT findings and compatible with fistulous tract between the bladder and lower abdominal wall abscess.  There was also erosion of the bony anatomy of the pubic bones bilaterally left greater than right concerning for metastatic disease versus osteomyelitis.  Started on broad-spectrum antibiotics.  Initial blood cultures from 07/10/2024 without growth.  Urine culture on 07/11/2024 with greater than 100,000 colonies of Proteus mirabilis.  Aspiration specimens with rare gram-positive cocci on  Gram stain and Proteus mirabilis and Aerococcus species on culture.  Ronald Kaiser is currently on day 9 of antimicrobial therapy with antibiotics narrowed to ceftriaxone  after having received broad-spectrum coverage with vancomycin , piperacillin-tazobactam, cefepime .   Urology consulted and given advanced age and multiple rounds of pelvic radiation would not be a surgical candidate at this time with recommendation for antibiotic treatment and bladder decompression.  Ronald Kaiser is feeling better since arriving in the hospital and tolerating antibiotics with no adverse side effects.  Spoke with his wife to provide update and she expressed concern about his memory recently and the possibility of his returning home and ability to care for him given her own comorbid medical conditions.  If he were to return home she feels that they would need additional support.  Review of Systems: Review of Systems  Constitutional:  Negative for chills, fever and weight loss.  Respiratory:  Negative for cough, shortness of breath and wheezing.   Cardiovascular:  Negative for chest pain and leg swelling.  Gastrointestinal:  Negative for abdominal pain, constipation, diarrhea, nausea and vomiting.  Skin:  Negative for rash.    Past Medical History:  Diagnosis Date   AKI (acute kidney injury) 11/12/2018   CAD (coronary artery disease)    CHF (congestive heart failure) (HCC)    Dyspnea    Foley catheter in place    HTN (hypertension)    Hyperlipidemia    Myocardial infarction (HCC) 2000   REPORTS HE HAS HAD 5 HEART ATTACKS BUT SOME OF THE WERE NOT SERIOUS    Obesity    Prostate cancer [facility])    Past Surgical History:  Procedure Laterality Date   CARDIAC CATHETERIZATION N/A 03/09/2016   Procedure: Left Heart Cath and Coronary Angiography;  Surgeon: Victory LELON Sharps, MD;  Location: [facility] INVASIVE CV LAB;  Service: Cardiovascular;  Laterality: N/A;   COLONOSCOPY N/A 05/28/2024   Procedure: COLONOSCOPY;  Surgeon: Leigh Elspeth SQUIBB, MD;  Location: [facility] ENDOSCOPY;  Service: Gastroenterology;  Laterality: N/A;   CYSTOSCOPY W/ RETROGRADES Bilateral 08/19/2020   Procedure: BILATERAL RETROGRADE PYELOGRAM;  Surgeon: Cam Morene LELON, MD;  Location: [facility] ORS;  Service: Urology;  Laterality:  Bilateral;   ESOPHAGOGASTRODUODENOSCOPY N/A 05/28/2024   Procedure: EGD (ESOPHAGOGASTRODUODENOSCOPY);  Surgeon: Leigh Elspeth SQUIBB, MD;  Location: [facility] ENDOSCOPY;  Service: Gastroenterology;  Laterality: N/A;   RADIATION TO PROSTATE     Stenting of LAD  2000   TONSILLECTOMY     TRANSURETHRAL RESECTION OF PROSTATE N/A 03/01/2019   Procedure: TRANSURETHRAL RESECTION OF THE PROSTATE (TURP);  Surgeon: Cam Morene LELON, MD;  Location: [facility] ORS;  Service: Urology;  Laterality: N/A;  75 MINS   TRANSURETHRAL RESECTION OF PROSTATE N/A 08/19/2020   Procedure: TRANSURETHRAL RESECTION OF THE PROSTATE (TURP);  Surgeon: Cam Morene LELON, MD;  Location: [facility] ORS;  Service: Urology;  Laterality: N/A;     Social History   Tobacco Use   Smoking status: Former    Current packs/day: 0.00    Average packs/day: 0.5 packs/day for 7.0 years (3.5 ttl pk-yrs)    Types: Cigarettes    Start date: 10/04/1955    Quit date: 10/03/1962    Years since quitting: 61.8   Smokeless tobacco: Never  Vaping Use   Vaping status: Never Used  Substance Use Topics   Alcohol use: Yes    Comment: [employer]    Drug use: Not Currently    Family History  Problem Relation Age of Onset   Hypertension Father  Breast cancer Neg Hx    Prostate cancer Neg Hx    Colon cancer Neg Hx    Pancreatic cancer Neg Hx     Allergies  Allergen Reactions   Bactrim [Sulfamethoxazole-Trimethoprim] Hives, Shortness Of Breath and Swelling   Augmentin [Amoxicillin-Pot Clavulanate] Diarrhea   Crestor  [Rosuvastatin  Calcium ] Other (See Comments)    Back pain Grogginess  Dizziness    Zetia  [Ezetimibe ] Other (See Comments)    Back pain Grogginess  Dizziness     OBJECTIVE: Blood pressure (!) 98/56, pulse 74, temperature 98 F (36.7 C), temperature source Oral, resp. rate 20, height 5' 11 (1.803 m), weight 67 kg, SpO2 94%.  Physical Exam Constitutional:      General: He is not in acute distress.    Appearance: He is  well-developed.  Cardiovascular:     Rate and Rhythm: Normal rate and regular rhythm.     Heart sounds: Normal heart sounds.  Pulmonary:     Effort: Pulmonary effort is normal.     Breath sounds: Normal breath sounds.  Skin:    General: Skin is warm and dry.  Neurological:     Mental Status: He is alert and oriented to person, place, and time.     Lab Results Lab Results  Component Value Date   WBC 7.8 07/18/2024   HGB 9.2 (L) 07/18/2024   HCT 29.2 (L) 07/18/2024   MCV 94.2 07/18/2024   PLT 322 07/18/2024    Lab Results  Component Value Date   CREATININE 0.65 07/18/2024   BUN 14 07/18/2024   NA 134 (L) 07/18/2024   K 3.9 07/18/2024   CL 100 07/18/2024   CO2 25 07/18/2024    Lab Results  Component Value Date   ALT 13 07/15/2024   AST 17 07/15/2024   ALKPHOS 52 07/15/2024   BILITOT 0.4 07/15/2024     Microbiology: Recent Results (from the past 240 hours)  Blood Culture (routine x 2)     Status: None   Collection Time: 07/10/24  7:05 PM   Specimen: BLOOD  Result Value Ref Range Status   Specimen Description BLOOD RIGHT ANTECUBITAL  Final   Special Requests   Final    BOTTLES DRAWN AEROBIC AND ANAEROBIC Blood Culture adequate volume   Culture   Final    NO GROWTH 5 DAYS Performed at [facility] : [facility], [address] [place], [place] [address]    Report Status 07/15/2024 FINAL  Final  Blood Culture (routine x 2)     Status: None   Collection Time: 07/10/24  7:10 PM   Specimen: BLOOD RIGHT FOREARM  Result Value Ref Range Status   Specimen Description BLOOD RIGHT FOREARM  Final   Special Requests   Final    BOTTLES DRAWN AEROBIC AND ANAEROBIC Blood Culture adequate volume   Culture   Final    NO GROWTH 5 DAYS Performed at [facility], [address], [place], [place] [address]    Report Status 07/15/2024 FINAL  Final  Urine Culture (for pregnant, neutropenic or urologic patients or patients with an indwelling urinary catheter)     Status: Abnormal    Collection Time: 07/11/24 12:47 AM   Specimen: Urine, Clean Catch  Result Value Ref Range Status   Specimen Description URINE, CLEAN CATCH  Final   Special Requests   Final    Immunocompromised Performed at [facility], [address] [place], [place] [address]    Culture >=100,000 COLONIES/mL PROTEUS MIRABILIS (  A)  Final   Report Status 07/13/2024 FINAL  Final   Organism ID, Bacteria PROTEUS MIRABILIS (A)  Final      Susceptibility   Proteus mirabilis - MIC*    AMPICILLIN <=2 SENSITIVE Sensitive     CEFAZOLIN (URINE) Value in next row Sensitive      <=1 SENSITIVEThis is a modified FDA-approved test that has been validated and its performance characteristics determined by the reporting laboratory.  This laboratory is certified under the Clinical Laboratory Improvement Amendments CLIA as qualified to perform high complexity clinical laboratory testing.    CEFEPIME  Value in next row Sensitive      <=1 SENSITIVEThis is a modified FDA-approved test that has been validated and its performance characteristics determined by the reporting laboratory.  This laboratory is certified under the Clinical Laboratory Improvement Amendments CLIA as qualified to perform high complexity clinical laboratory testing.    ERTAPENEM Value in next row Sensitive      <=1 SENSITIVEThis is a modified FDA-approved test that has been validated and its performance characteristics determined by the reporting laboratory.  This laboratory is certified under the Clinical Laboratory Improvement Amendments CLIA as qualified to perform high complexity clinical laboratory testing.    CEFTRIAXONE  Value in next row Sensitive      <=1 SENSITIVEThis is a modified FDA-approved test that has been validated and its performance characteristics determined by the reporting laboratory.  This laboratory is certified under the Clinical Laboratory Improvement Amendments CLIA as qualified to perform high complexity clinical laboratory  testing.    CIPROFLOXACIN  Value in next row Sensitive      <=1 SENSITIVEThis is a modified FDA-approved test that has been validated and its performance characteristics determined by the reporting laboratory.  This laboratory is certified under the Clinical Laboratory Improvement Amendments CLIA as qualified to perform high complexity clinical laboratory testing.    GENTAMICIN  Value in next row Sensitive      <=1 SENSITIVEThis is a modified FDA-approved test that has been validated and its performance characteristics determined by the reporting laboratory.  This laboratory is certified under the Clinical Laboratory Improvement Amendments CLIA as qualified to perform high complexity clinical laboratory testing.    NITROFURANTOIN Value in next row Resistant      <=1 SENSITIVEThis is a modified FDA-approved test that has been validated and its performance characteristics determined by the reporting laboratory.  This laboratory is certified under the Clinical Laboratory Improvement Amendments CLIA as qualified to perform high complexity clinical laboratory testing.    TRIMETH/SULFA Value in next row Sensitive      <=1 SENSITIVEThis is a modified FDA-approved test that has been validated and its performance characteristics determined by the reporting laboratory.  This laboratory is certified under the Clinical Laboratory Improvement Amendments CLIA as qualified to perform high complexity clinical laboratory testing.    AMPICILLIN/SULBACTAM Value in next row Sensitive      <=1 SENSITIVEThis is a modified FDA-approved test that has been validated and its performance characteristics determined by the reporting laboratory.  This laboratory is certified under the Clinical Laboratory Improvement Amendments CLIA as qualified to perform high complexity clinical laboratory testing.    PIP/TAZO Value in next row Sensitive      <=4 SENSITIVEThis is a modified FDA-approved test that has been validated and its performance  characteristics determined by the reporting laboratory.  This laboratory is certified under the Clinical Laboratory Improvement Amendments CLIA as qualified to perform high complexity clinical laboratory testing.    MEROPENEM Value in  next row Sensitive      <=4 SENSITIVEThis is a modified FDA-approved test that has been validated and its performance characteristics determined by the reporting laboratory.  This laboratory is certified under the Clinical Laboratory Improvement Amendments CLIA as qualified to perform high complexity clinical laboratory testing.    * >=100,000 COLONIES/mL PROTEUS MIRABILIS  Aerobic/Anaerobic Culture w Gram Stain (surgical/deep wound)     Status: None   Collection Time: 07/12/24  3:09 PM   Specimen: Abscess  Result Value Ref Range Status   Specimen Description ABSCESS  Final   Special Requests LLQ  Final   Gram Stain   Final    ABUNDANT WBC PRESENT, PREDOMINANTLY PMN RARE GRAM POSITIVE COCCI IN CLUSTERS    Culture   Final    MODERATE PROTEUS MIRABILIS FEW AEROCOCCUS SPECIES Standardized susceptibility testing for this organism is not available. Performed at [facility], [address] [place], [place] [address]    Report Status 07/17/2024 FINAL  Final   Organism ID, Bacteria PROTEUS MIRABILIS  Final      Susceptibility   Proteus mirabilis - MIC*    AMPICILLIN <=2 SENSITIVE Sensitive     CEFAZOLIN (NON-URINE) 4 INTERMEDIATE Intermediate     CEFEPIME  <=0.12 SENSITIVE Sensitive     ERTAPENEM <=0.12 SENSITIVE Sensitive     CEFTRIAXONE  <=0.25 SENSITIVE Sensitive     CIPROFLOXACIN  <=0.06 SENSITIVE Sensitive     GENTAMICIN  <=1 SENSITIVE Sensitive     MEROPENEM <=0.25 SENSITIVE Sensitive     TRIMETH/SULFA <=20 SENSITIVE Sensitive     AMPICILLIN/SULBACTAM <=2 SENSITIVE Sensitive     PIP/TAZO Value in next row Sensitive      <=4 SENSITIVEThis is a modified FDA-approved test that has been validated and its performance characteristics determined by the  reporting laboratory.  This laboratory is certified under the Clinical Laboratory Improvement Amendments CLIA as qualified to perform high complexity clinical laboratory testing.    * MODERATE PROTEUS MIRABILIS   Imaging  CT CYSTOGRAM ABD/PELVIS Result Date: 07/17/2024 CLINICAL DATA:  Pelvic abscess with possible communication between the bladder and the abscess. EXAM: CT CYSTOGRAM (CT ABDOMEN AND PELVIS WITH CONTRAST) TECHNIQUE: Multi-detector CT imaging through the abdomen and pelvis was performed after dilute contrast had been introduced into the bladder for the purposes of performing CT cystography. RADIATION DOSE REDUCTION: This exam was performed according to the departmental dose-optimization program which includes automated exposure control, adjustment of the mA and/or kV according to patient size and/or use of iterative reconstruction technique. CONTRAST:  50mL OMNIPAQUE  IOHEXOL  300 MG/ML  SOLN COMPARISON:  MRI 07/16/2024. FINDINGS: Lower chest: Bronchiectasis with architectural distortion and atelectasis/scarring noted in both lung bases, right greater. Hepatobiliary: No suspicious focal abnormality in the liver on this study without intravenous contrast. There is no evidence for gallstones, gallbladder wall thickening, or pericholecystic fluid. No intrahepatic or extrahepatic biliary dilation. Pancreas: No focal mass lesion. No dilatation of the main duct. No intraparenchymal cyst. No peripancreatic edema. Spleen: No splenomegaly. No suspicious focal mass lesion. Adrenals/Urinary Tract: No adrenal nodule or mass. Mild fullness noted right ureter without overt right hydroureteronephrosis. There is mild left hydroureteronephrosis. Contrast material in the bladder has been introduced in a retrograde fashion. Bladder is poorly distended but there is evidence for extraperitoneal extravasation of contrast into the pelvic floor with contrast material tracking anteriorly in the pelvis through the region of  the symphysis pubis into a thick walled collection of fluid, gas, and extravasated contrast in the anterior pelvic wall corresponding to  the described abscess on CT of 07/10/2024. the fistula between the bladder seems to tract just caudal to the symphysis pubis although it may also tract directly through the symphysis pubis itself. Stomach/Bowel: Stomach is unremarkable. No gastric wall thickening. No evidence of outlet obstruction. Duodenum is normally positioned as is the ligament of Treitz. No small bowel wall thickening. No small bowel dilatation. The terminal ileum is normal. The appendix is normal. No gross colonic mass. No colonic wall thickening. Moderate stool volume evident. Vascular/Lymphatic: There is moderate atherosclerotic calcification of the abdominal aorta without aneurysm. There is no gastrohepatic or hepatoduodenal ligament lymphadenopathy. No retroperitoneal or mesenteric lymphadenopathy. No pelvic sidewall lymphadenopathy. Reproductive: Prostate gland not well seen. Other: No substantial intraperitoneal free fluid. Musculoskeletal: Diffuse body wall edema evident. There is erosion bony anatomy in the pubic bones bilaterally, left greater than right, raising concern for metastatic disease and/or osteomyelitis. Compression deformity noted at T11 and T12. IMPRESSION: 1. Extraperitoneal extravasation of contrast at the bladder base into the pelvic floor with contrast material tracking anteriorly in the pelvis through the region of the symphysis pubis into a thick walled collection of fluid, gas, and extravasated contrast in the anterior pelvic wall corresponding to the described abscess on CT of 07/10/2024. 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 2. Erosion of the bony anatomy in the pubic bones bilaterally, left greater than right, raising concern for metastatic disease and/or osteomyelitis. This was better characterized  on recent MRI of the pelvis. 3. Mild left hydroureteronephrosis. 4. Bronchiectasis with architectural distortion and atelectasis/scarring in both lung bases, right greater than left. 5. Diffuse body wall edema. 6. Compression deformity at T11 and T12. 7.  Aortic Atherosclerosis (ICD10-I70.0). Electronically Signed   By: Camellia Candle M.D.   On: 07/17/2024 12:50   MR PELVIS W WO CONTRAST Result Date: 07/17/2024 CLINICAL DATA:  Prior radiation therapy to the pelvis for prostate cancer. Cortical erosions of the pubis. History pelvic/prostate radiotherapy in 2003, salvage SBRT to the left seminal vesicle and T12 in 2022, and more recently SP RT to a left pubic ramus metastasis in 2023. EXAM: MRI PELVIS WITHOUT AND WITH CONTRAST TECHNIQUE: Multiplanar multisequence MR imaging of the pelvis was performed both before and after administration of intravenous contrast. CONTRAST:  6.5mL GADAVIST GADOBUTROL 1 MMOL/ML IV SOLN COMPARISON:  CT scan 07/10/2024 FINDINGS: Urinary Tract: Mild distal hydroureter. Irregular and thick-walled urinary bladder with enhancing margins compatible with cystitis, with a Foley catheter in place. There is some mixed signal intensity material inferiorly in the urinary bladder below the Foley catheter balloon, and suspected continuity of the anterior wall of the urinary bladder with the pubic symphysis on image 36 series 11. The fluid in the pubic symphysis itself is questionably continuous with an apparent abscess of the rectus abdominus musculature (left greater than right) and linea alba measuring about 7.1 by 2.4 by 8.3 cm (volume = 74 cm^3), with internal gas and fluid density and prominently enhancing margins. If clinically warranted, cystogram conducted through the Foley catheter could be utilized to assess for confirm continuity of the urinary bladder with the pubic symphysis and anterior abdominal wall abscess. Bowel: Moderate lower rectal wall thickening, radiation proctitis not  excluded. Vascular/Lymphatic: Unremarkable Reproductive:  Recognizable prostate gland not identified. Other: Low-level edema tracks along the iliacus and gluteal musculature. More striking edema along the hip adductor musculature and pelvic sidewall musculature could reflect myositis. Musculoskeletal: Bony erosions in the pubic bodies  along the pubic symphysis with associated marrow edema in the pubic bodies and adjacent pubic rami favoring osteomyelitis. Enhancing lesion favoring a metastatic deposit in the left ischial tuberosity measuring 2.8 cm in diameter on image 40 series 7. Bandlike edema with central low signal T2 intensity in the right iliac bone adjacent to the SI joint measuring 4.4 by 1.5 cm on image 19 series 6, metastatic lesion not excluded. 1.5 cm in diameter lesion with high T2 and low T1 signal in the left femoral head and neck on image 24 series 3, metastatic lesion not excluded. Lower lumbar spondylosis and degenerative disc disease. IMPRESSION: 1. Bony erosions in the pubic bodies along the pubic symphysis with associated marrow edema in the pubic bodies and adjacent pubic rami favoring osteomyelitis. 2. Suspected continuity of the anterior lumen of the urinary bladder with the pubic symphysis and anterior abdominal wall abscess. if clinically warranted, cystogram through the Foley catheter could be utilized to assess for continuity of the urinary bladder with the pubic symphysis and anterior abdominal wall abscess. 3. Enhancing lesion favoring a metastatic deposit in the left ischial tuberosity. 4. Bandlike edema with central low signal T2 intensity in the right iliac bone adjacent to the SI joint, metastatic lesion not excluded. 5. 1.5 cm in diameter lesion with high T2 and low T1 signal in the left femoral head and neck, metastatic lesion not excluded. 6. Moderate lower rectal wall thickening, radiation proctitis not excluded. 7. Mild distal hydroureter. 8. Low-level edema tracks along the  iliacus and gluteal musculature. More striking edema along the hip adductor musculature and pelvic sidewall musculature could reflect myositis. Electronically Signed   By: Ryan Salvage M.D.   On: 07/17/2024 11:43   US  Abscess Drain Result Date: 07/12/2024 INDICATION: Severe lower abdominal pain. CT demonstrates Low anterior rectus abdominus collection with surrounding stranding likely representing abscess. EXAM: U/S ASPIRATION OF ANTERIOR BODY WALL COLLECTION MEDICATIONS: No periprocedural antibiotics were indicated ANESTHESIA/SEDATION: 1% lidocaine  subcutaneous COMPLICATIONS: None immediate. PROCEDURE: Informed written consent was obtained from the patient after a thorough discussion of the procedural risks, benefits and alternatives. All questions were addressed. Maximal Sterile Barrier Technique was utilized including caps, mask, sterile gowns, sterile gloves, sterile drape, hand hygiene and skin antiseptic. A timeout was performed prior to the initiation of the procedure. Survey ultrasound of the left pelvic anterior body wall revealed a complex fluid collection corresponding to recent CT findings. An appropriate lateral skin entry site was determined and marked, then prepped with chlorhexidine , draped in usual sterile fashion, infiltrated locally with 1% lidocaine . Under real-time ultrasound guidance, a 10 cm multi sidehole 5 [place] Yueh sheath needle advanced into the collection. Approximately 30 mL of cloudy serosanguineous fluid were aspirated using 2 passes. These were sent for culture and sensitivity. Postprocedure scans show significant decompression of the collection, with small residual fluid. The patient tolerated the procedure well. IMPRESSION: 1. Technically successful ultrasound-guided aspiration of left anterior pelvic body wall collection, removing 30 mL cloudy fluid, sent for Gram stain and culture. Electronically Signed   By: JONETTA Faes M.D.   On: 07/12/2024 16:07   ECHOCARDIOGRAM  LIMITED Result Date: 07/11/2024    ECHOCARDIOGRAM LIMITED REPORT   Patient Name:   Ronald Kaiser Date of Exam: 07/11/2024 Medical Rec #:  [id number]      Height:       71.0 in Accession #:    [id number]     Weight:       141.0 lb Date of Birth:  [birth year]     BSA:          1.817 m Patient Age:    89 years       BP:           96/51 mmHg Patient Gender: M              HR:           81 bpm. Exam Location:  Inpatient Procedure: 2D Echo, Limited Color Doppler and Intracardiac Opacification Agent Indications:    Congestive heart failure  History:        Patient has prior history of Echocardiogram examinations. CHF,                 CAD; Risk Factors:Hypertension.  Sonographer:    Charmaine Gaskins Referring Phys: [id number] SUBRINA SUNDIL IMPRESSIONS  1. Left ventricular ejection fraction, by estimation, is 25 to 30%. The left ventricle has severely decreased function. The left ventricle demonstrates regional wall motion abnormalities with mid to apical anteroseptal and inferoseptal akinesis, apical anterior akinesis, apical lateral akinesis, apical inferior akinesis and akinesis of the true apex. LAD territory wall motion abnormalities. No LV thrombus visualized.  2. The mitral valve is normal in structure. Mild mitral valve regurgitation. No evidence of mitral stenosis.  3. The aortic valve is tricuspid. There is moderate calcification of the aortic valve. Aortic valve regurgitation is mild. No aortic stenosis is present.  4. Aortic dilatation noted. There is mild dilatation of the aortic root, measuring 40 mm.  5. Limited echo for LV function. FINDINGS  Left Ventricle: Left ventricular ejection fraction, by estimation, is 25 to 30%. The left ventricle has severely decreased function. The left ventricle demonstrates regional wall motion abnormalities. Definity  contrast agent was given IV to delineate the left ventricular endocardial borders. Left Atrium: Left atrial size was normal in size. Right Atrium: Right atrial size  was normal in size. Mitral Valve: The mitral valve is normal in structure. Mild mitral valve regurgitation. No evidence of mitral valve stenosis. Tricuspid Valve: Tricuspid valve regurgitation is mild. Aortic Valve: The aortic valve is tricuspid. There is moderate calcification of the aortic valve. Aortic valve regurgitation is mild. No aortic stenosis is present. Aorta: The aortic root is normal in size and structure and aortic dilatation noted. There is mild dilatation of the aortic root, measuring 40 mm.  LV Volumes (MOD) LV vol d, MOD A2C: 142.0 ml LV vol d, MOD A4C: 182.0 ml LV vol s, MOD A2C: 108.0 ml LV vol s, MOD A4C: 112.0 ml LV SV MOD A2C:     34.0 ml LV SV MOD A4C:     182.0 ml LV SV MOD BP:      57.0 ml RIGHT VENTRICLE RV S prime:     21.10 cm/s  AORTA Ao Root diam: 4.00 cm Ao Asc diam:  3.50 cm Dalton McleanMD Electronically signed by Ezra Kanner Signature Date/Time: 07/11/2024/1:35:24 PM    Final    CT Angio Chest/Abd/Pel for Dissection W and/or Wo Contrast Result Date: 07/10/2024 CLINICAL DATA:  Acute aortic syndrome suspected. Shortness of breath. EXAM: CT ANGIOGRAPHY CHEST, ABDOMEN AND PELVIS TECHNIQUE: Non-contrast CT of the chest was initially obtained. Multidetector CT imaging through the chest, abdomen and pelvis was performed using the standard protocol during bolus administration of intravenous contrast. Multiplanar reconstructed images and MIPs were obtained and reviewed to evaluate the vascular anatomy. RADIATION DOSE REDUCTION: This exam was performed according to the departmental dose-optimization program which includes automated exposure control,  adjustment of the mA and/or kV according to patient size and/or use of iterative reconstruction technique. CONTRAST:  75mL OMNIPAQUE  IOHEXOL  350 MG/ML SOLN COMPARISON:  Chest radiograph 07/10/2024. CT abdomen and pelvis 05/26/2024. CT chest 03/04/2022. PET CT 12/13/2022 FINDINGS: CTA CHEST FINDINGS Cardiovascular: Unenhanced images of the  chest demonstrate diffuse calcification throughout the aorta and coronary arteries. No acute intramural hematoma. Images obtained during arterial phase after intravenous contrast material administration demonstrate patent common normal caliber thoracic aorta. No aortic dissection. Great vessel origins are patent. No filling defects in the central pulmonary arteries. No evidence of significant pulmonary embolus. Normal heart size although the left ventricle appears mildly dilated. No pericardial effusion. Mediastinum/Nodes: Esophagus is decompressed. No significant lymphadenopathy. Thyroid gland is unremarkable. Lungs/Pleura: Emphysematous changes in the lungs. Bilateral basilar atelectasis. Peribronchial thickening with mild bronchiectasis consistent with chronic bronchitis. No pleural effusion or pneumothorax. Musculoskeletal: Degenerative changes in the spine. Compression of T11 and T12 vertebra, unchanged. Sclerotic lesions demonstrated in several thoracic vertebrae and ribs corresponding to known metastatic disease. Review of the MIP images confirms the above findings. CTA ABDOMEN AND PELVIS FINDINGS VASCULAR Aorta: Normal caliber aorta without aneurysm, dissection, vasculitis or significant stenosis. Diffuse aortic calcification. Celiac: Patent without evidence of aneurysm, dissection, vasculitis or significant stenosis. SMA: Patent without evidence of aneurysm, dissection, vasculitis or significant stenosis. Renals: Both renal arteries are patent without evidence of aneurysm, dissection, vasculitis, fibromuscular dysplasia or significant stenosis. IMA: Patent without evidence of aneurysm, dissection, vasculitis or significant stenosis. Inflow: Patent without evidence of aneurysm, dissection, vasculitis or significant stenosis. Veins: No obvious venous abnormality within the limitations of this arterial phase study. Review of the MIP images confirms the above findings. NON-VASCULAR Hepatobiliary: No focal liver  abnormality is seen. No gallstones, gallbladder wall thickening, or biliary dilatation. Pancreas: Unremarkable. No pancreatic ductal dilatation or surrounding inflammatory changes. Spleen: Normal in size without focal abnormality. Adrenals/Urinary Tract: No adrenal gland nodules. Renal nephrograms are symmetrical but there is bilateral hydronephrosis and hydroureter down to the level of the bladder. No ureteral stones are identified. Changes likely indicate reflux disease or bladder outlet obstruction. Diffuse bladder wall thickening likely to represent radiation change given history of prostate cancer. Bladder outlet obstruction could also have this appearance. Coarse calcifications in the dependent portion of the bladder or possibly in the bladder wall or prostate. This is unchanged since prior study. Stomach/Bowel: Stomach, small bowel, and colon are not abnormally distended. Stool throughout the colon. No wall thickening or inflammatory changes. Appendix is not identified. Lymphatic: No significant lymphadenopathy. Reproductive: Prostate gland is not well visualized and may be surgically absent or atrophic due to radiation therapy. In the prostate bed, there is increased soft tissue which extends into the anterolateral left pelvic sidewall and the left proximal hip adductor muscles. This likely represents residual tumor or post treatment changes. Pelvic soft tissue infiltration is increasing since the previous studies. Loss of fat plane between the soft tissues and the bladder. Pelvic sidewall soft tissue today measures 3.2 cm thickness. Other: No free air or free fluid in the abdomen. There is a large heterogeneous collection in the inferior left and central rectus abdominus muscles extending down to the symphysis pubis. This collection measures up to about 5.2 cm diameter. Stranding in the surrounding subcutaneous fat. This is likely an abscess. Musculoskeletal: Degenerative changes in the spine and hips.  Scattered focal areas of bone sclerosis consistent with known metastasis. Cortical erosion at the symphysis pubis may represent post treatment changes or osteomyelitis. Review of  the MIP images confirms the above findings. IMPRESSION: 1. Aortic atherosclerosis. No evidence of aneurysm or dissection involving the thoracic or abdominal aorta. 2. Overall normal heart size with left ventricular dilatation. 3. Atelectasis in the lung bases. 4. Increasing soft tissue mass/infiltration in the low pelvis involving the prostate bed and extending to the left pelvic sidewall and into the proximal left adductor muscles. This likely represents residual recurrent tumor versus post treatment changes or possibly inflammatory process. 5. Diffuse bladder wall thickening may be due to bladder outlet obstruction or treatment changes. Stable calcification in the bladder or prostate region. Bilateral hydronephrosis and hydroureter likely related to reflux or bladder outlet obstruction. 6. Low anterior rectus abdominus collection with surrounding stranding likely representing abscess. 7. Cortical erosion at the symphysis pubis may represent osteomyelitis or possibly radiation changes. 8. Multiple sclerotic bone lesions consistent with known metastatic disease. Electronically Signed   By: Elsie Gravely M.D.   On: 07/10/2024 23:03   DG Chest Port 1 View Result Date: 07/10/2024 CLINICAL DATA:  Question of sepsis to evaluate for abnormality. Diffuse abdominal pain for 2 weeks radiating down the legs. EXAM: PORTABLE CHEST 1 VIEW COMPARISON:  06/06/2024 FINDINGS: Shallow inspiration. Heart size and pulmonary vascularity are normal for technique. Peribronchial thickening with bronchiectasis in streaky perihilar opacities likely representing bronchitic changes. This could represent acute or chronic bronchitis. Scarring in the lung apices and bases. Probable linear atelectasis also in the lung bases. No pleural effusion or pneumothorax.  Mediastinal contours appear intact. Calcification of the aorta. Poorly defined sclerotic lesions demonstrated in the scapula and ribs correlating with known metastatic disease. IMPRESSION: 1. Shallow inspiration. 2. Emphysematous changes, fibrosis, and bronchitic changes in the lungs. 3. Mild linear atelectasis in the lung bases. No focal consolidation. 4. Sclerotic bone lesions corresponding to known metastatic disease. Electronically Signed   By: Elsie Gravely M.D.   On: 07/10/2024 19:37     I have personally spent 40 minutes involved in face-to-face and non-face-to-face activities for this patient on the day of the visit. Professional time spent includes the following activities: preparing to see the patient (review of tests), obtaining and reviewing separately obtained history (admission/discharge record), performing a medically appropriate examination, ordering medications, communicating with other health care professionals, documenting clinical information in the EMR, communicating results and counseling patient and family regarding medication and plan of care, and care coordination.    Greg Calone, NP [facility]  07/18/2024  11:44 AM

## 2024-07-19 DIAGNOSIS — Z7189 Other specified counseling: Secondary | ICD-10-CM

## 2024-07-19 DIAGNOSIS — Z515 Encounter for palliative care: Secondary | ICD-10-CM | POA: Diagnosis not present

## 2024-07-19 DIAGNOSIS — L02211 Cutaneous abscess of abdominal wall: Secondary | ICD-10-CM | POA: Diagnosis not present

## 2024-07-19 DIAGNOSIS — I5020 Unspecified systolic (congestive) heart failure: Secondary | ICD-10-CM | POA: Diagnosis not present

## 2024-07-19 DIAGNOSIS — E43 Unspecified severe protein-calorie malnutrition: Secondary | ICD-10-CM | POA: Insufficient documentation

## 2024-07-19 LAB — CBC WITH DIFFERENTIAL/PLATELET
Abs Immature Granulocytes: 0.04 K/uL (ref 0.00–0.07)
Basophils Absolute: 0.1 K/uL (ref 0.0–0.1)
Basophils Relative: 1 %
Eosinophils Absolute: 0.1 K/uL (ref 0.0–0.5)
Eosinophils Relative: 1 %
HCT: 27.6 % — ABNORMAL LOW (ref 39.0–52.0)
Hemoglobin: 8.7 g/dL — ABNORMAL LOW (ref 13.0–17.0)
Immature Granulocytes: 1 %
Lymphocytes Relative: 13 %
Lymphs Abs: 1 K/uL (ref 0.7–4.0)
MCH: 29.5 pg (ref 26.0–34.0)
MCHC: 31.5 g/dL (ref 30.0–36.0)
MCV: 93.6 fL (ref 80.0–100.0)
Monocytes Absolute: 0.4 K/uL (ref 0.1–1.0)
Monocytes Relative: 6 %
Neutro Abs: 6.2 K/uL (ref 1.7–7.7)
Neutrophils Relative %: 78 %
Platelets: 319 K/uL (ref 150–400)
RBC: 2.95 MIL/uL — ABNORMAL LOW (ref 4.22–5.81)
RDW: 19.1 % — ABNORMAL HIGH (ref 11.5–15.5)
WBC: 7.8 K/uL (ref 4.0–10.5)
nRBC: 0 % (ref 0.0–0.2)

## 2024-07-19 LAB — BASIC METABOLIC PANEL WITH GFR
Anion gap: 9 (ref 5–15)
BUN: 20 mg/dL (ref 8–23)
CO2: 26 mmol/L (ref 22–32)
Calcium: 8.2 mg/dL — ABNORMAL LOW (ref 8.9–10.3)
Chloride: 99 mmol/L (ref 98–111)
Creatinine, Ser: 0.69 mg/dL (ref 0.61–1.24)
GFR, Estimated: >60 mL/min (ref >60)
Glucose, Bld: 117 mg/dL — ABNORMAL HIGH (ref 70–99)
Potassium: 4.2 mmol/L (ref 3.5–5.1)
Sodium: 134 mmol/L — ABNORMAL LOW (ref 135–145)

## 2024-07-19 MED ORDER — ENZALUTAMIDE 40 MG PO TABS: 160.0000 mg | ORAL_TABLET | Freq: Every day | ORAL | Status: DC

## 2024-07-19 MED ORDER — RELUGOLIX 120 MG PO TABS: 120.0000 mg | ORAL_TABLET | Freq: Every day | ORAL | Status: DC

## 2024-07-19 NOTE — Consult Note (Incomplete)
 Palliative Care Consult Note                                  Date: 07/19/2024   Patient Name: Ronald Kaiser  DOB: [birth year]  MRN: [id number]  Age / Sex: 89 y.o., male  PCP: Ronald Kaiser Referring Physician: Sherlon Brayton RAMAN, MD  Reason for Consultation: Establishing goals of care  HPI/Patient Profile: 89 y.o. male  with past medical history of HFrEF 25-30%, metastatic prostate cancer on Xtandi , radiation proctitis, Prementaid A-fib not on anticoagulation due to history of recent GI bleed, and anemia.  Patient with multiple recent hospitalizations in August and September due to GI bleed, radiation proctitis, and UTI.  He presented to the ED on 07/10/2024 with lower abdominal pain and is admitted with UTI/cystitis, abdominal wall rectus sheath abscess, and pubic symphysis-pelvic ramus infection/osteomyelitis.   Palliative Medicine has been consulted for goals of care discussions and complex medical decision making.   Clinical Assessment and Goals of Care:   Extensive chart review has been completed including labs, vital signs, imaging, progress/consult notes, orders, medications and available advance directive documents.    I met with patient to discuss diagnosis, prognosis, GOC, disposition, and options.  I introduced Palliative Medicine as specialized medical care for people living with serious illness. It focuses on providing relief from the symptoms and stress of a serious illness.   Created space and opportunity for patient and family to express thoughts and feelings regarding current medical situation. Values and goals of care were attempted to be elicited.  A discussion was had today regarding advanced directives. We discussed code status and scopes of care. We discussed the difference between full scope versus limited interventions versus comfort care. The MOST form was introduced and discussed.  Life Review: Patient  was born in [place], but raised in [place] .  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  His wife Ronald Kaiser was his high school prom date, they married after reconnecting at their 50th class reunion.  He has 1 son from his previous marriage.  Functional Status: Patient lives at home with his wife.  He is ambulatory and independent with basic ADLs.  Discussion: We discussed patient's current illness and what it means in the larger context of his ongoing co-morbidities.  Hospital course and current clinical status was reviewed.   Patient has a fairly good understanding of his medical issues. We reviewed his history of prostate cancer, heart failure with reduced EF, and A-fib.  We also reviewed his acute issues of an abdominal wall abscess and pelvic bone infection.  I offered education that this is a very serious infection and requires a longer course of treatment, which is why ID has recommended 6 weeks of IV antibiotics.  Patient expresses some frustration that there is not yet a plan for where he will receive these antibiotics.  He states he prefers to be at home, but is concerned that this will be difficult for his wife due to her vision impairment secondary to him macular degeneration.  Discussed the what ifs and encouraged patient to consider what medical interventions he would want  (or not want) in the event his condition were to deteriorate, keeping in mind the concept of quality of life.   Discussed code status. I encouraged consideration of DNR status and provided education on evidence-based poor outcomes in similar hospitalized patients,  as the cause of cardiac arrest would likely be associated with advanced illness rather than a reversible condition.  Patient states that his goal is to live; also to be at home and functional. He is unsure if he would want aggressive interventions such as CPR and/or mechanical ventilation, but plans to give  this further consideration. However, he is very clear he would want to live on machines if there was low chance for meaningful recovery. He specifically states he would not want to be a vegetable.   Discussed the importance of continued conversation with the medical team regarding overall plan of care and treatment options. Questions and concerns addressed.  _______________________________________________________________________  I later spoke with his wife Ronald Kaiser by phone for collateral information. She confirms she is not able to help care for him at home  and wants him to go to rehab. She is interested in the rehab at [facility], and I told I would pass this along to the care team.    Review of Systems  Gastrointestinal:  Positive for abdominal pain.    Objective:   Primary Diagnoses: Present on Admission:  Paroxysmal atrial fibrillation [facility])  Essential hypertension  Prostate cancer metastatic to bone [facility])   Physical Exam Vitals reviewed.  Constitutional:      General: He is not in acute distress.    Comments: Chronically ill-appearing  Pulmonary:     Effort: Pulmonary effort is normal.  Neurological:     Mental Status: He is alert and oriented to person, place, and time.     Vital Signs:  BP (!) 101/49 (BP Location: Left Arm)   Pulse 83   Temp 98 F (36.7 C) (Oral)   Resp 20   Ht 5' 11 (1.803 m)   Wt 67 kg   SpO2 95%   BMI 20.60 kg/m   Palliative Assessment/Data: PPS 50%     Assessment & Plan:   SUMMARY OF RECOMMENDATIONS   Continue current scope of care Goal of care is continued treatment and recovery to the greatest extent possible Ongoing palliative support  Primary Decision Maker: PATIENT  Existing Vynca/ACP Documentation: None Wife states he has a living will and HCPOA document; I have requested she please provide a copy to the hospital  Code Status/Advance Care Planning: Full code  Symptom Management:  Per attending  Prognosis:  Unable  to determine  Discharge Planning:  To Be Determined     Thank you for allowing us  to participate in the care of Ronald Kaiser   Time Total: 78 minutes  Detailed review of medical records (labs, imaging, vital signs), medically appropriate exam, discussed with treatment team, counseling and education to patient, family, & staff, documenting clinical information, coordination of care.   Signed by: Recardo Loll, NP Palliative Medicine Team  Team Phone # [phone]  For individual providers, please see AMION

## 2024-07-19 NOTE — Plan of Care (Signed)

## 2024-07-19 NOTE — TOC Initial Note (Signed)
 Transition of Care [facility]) - Initial/Assessment Note    Patient Details  Name: Ronald Kaiser MRN: [id number] Date of Birth: 12-29-1933  Transition of Care [facility]) CM/SW Contact:    Inocente GORMAN Kindle, LCSW Phone Number: 07/19/2024, 6:08 PM  Clinical Narrative:                 CSW met with patient and discussed discharge plan. Patient expressed that he does not want to go to a SNF for 6 weeks (he has toured [facility] before but does not feel he and wife are ready for that). Patient stated he would like to remain in the hospital for the 6 weeks. CSW explained that insurance would not cover him to remain in the hospital only for antibiotics and that he would either have to complete them in a SNF or at home. He expressed frustration but stated that he talked with his wife and would like to discuss doing them at home. CSW offered to have [facility] company speak with him and show him what would be required (CSW has been advised that it would be a 10 minute push with pre-filled syringes at home). Patient reported agreement and stated that his wife can see better than people think she can. CSW requested [employer] liaison, Pam, visit patient. She stated she would be able to come out on Monday.  CSW did leave SNF bed offers and Medicare ratings list with patient just in case.   Expected Discharge Plan: Home w Home Health Services Barriers to Discharge: Continued Medical Work up   Patient Goals and CMS Choice Patient states their goals for this hospitalization and ongoing recovery are:: Return home CMS Medicare.gov Compare Post Acute Care list provided to:: Patient Choice offered to / list presented to : Patient, Spouse      Expected Discharge Plan and Services In-house Referral: Clinical Social Work Discharge Planning Services: CM Consult Post Acute Care Choice: Skilled Nursing Facility Living arrangements for the past 2 months: Single Family Home                                       Prior Living Arrangements/Services Living arrangements for the past 2 months: Single Family Home Lives with:: Spouse Patient language and need for interpreter reviewed:: Yes Do you feel safe going back to the place where you live?: Yes      Need for Family Participation in Patient Care: Yes (Comment) Care giver support system in place?: Yes (comment)   Criminal Activity/Legal Involvement Pertinent to Current Situation/Hospitalization: No - Comment as needed  Activities of Daily Living   ADL Screening (condition at time of admission) Independently performs ADLs?: Yes (appropriate for developmental age) Is the patient deaf or have difficulty hearing?: Yes Does the patient have difficulty seeing, even when wearing glasses/contacts?: Yes Does the patient have difficulty concentrating, remembering, or making decisions?: No  Permission Sought/Granted Permission sought to share information with : Facility Industrial/product designer granted to share information with : Yes, Verbal Permission Granted              Emotional Assessment Appearance:: Appears stated age Attitude/Demeanor/Rapport: Engaged Affect (typically observed): Accepting Orientation: : Oriented to Self, Oriented to Place, Oriented to  Time, Oriented to Situation Alcohol / Substance Use: Not Applicable Psych Involvement: No (comment)  Admission diagnosis:  Abdominal wall abscess [L02.211] Patient Active Problem List   Diagnosis Date Noted  Protein-calorie malnutrition, severe 07/19/2024   Abdominal wall abscess 07/11/2024   History of CAD (coronary artery disease) 07/11/2024   HFrEF (heart failure with reduced ejection fraction) (HCC) 07/11/2024   Sepsis (HCC) 07/11/2024   Elevated troponin 07/11/2024   Iron  deficiency anemia, unspecified 06/11/2024   Dizziness 06/07/2024   Paroxysmal atrial fibrillation (HCC) 05/30/2024   Radiation proctitis 05/28/2024   Gastric erythema 05/28/2024   Rectal  bleeding 05/27/2024   Iron  deficiency anemia 05/27/2024   Symptomatic anemia 05/26/2024   Prostate cancer metastatic to bone (HCC) 02/26/2021   Bladder outlet obstruction 11/27/2018   Acute kidney injury    Hypokalemia    Other hydronephrosis    Acute on chronic systolic CHF (congestive heart failure) (HCC) 11/11/2018   AKI (acute kidney injury) 11/11/2018   Basal cell carcinoma (BCC) of skin of nose 01/24/2018   Abnormal nuclear cardiac imaging test    Actinic keratoses 01/13/2016   CHF (congestive heart failure) (HCC) 01/13/2016   Male erectile disorder 01/13/2016   Osteoarthritis 01/13/2016   Seborrheic keratoses 01/13/2016   Bruit 11/23/2012   JDMS (juvenile dermatomyositis) (HCC) 11/25/2011   Mixed hyperlipidemia 11/25/2011   Coronary arteriosclerosis 11/25/2011   Essential hypertension 11/25/2011   History of prostate cancer 10/03/2001   PCP:  [name], [name] Pharmacy:   [facility] [address], [place] - [address] [place] [address] Phone: [phone] Fax: [phone]  [facility] - [place], [place] - [address] [place] [address] Phone: [phone] Fax: [phone]     Social Drivers of Health (SDOH) Social History: SDOH Screenings   Food Insecurity: Patient Declined (07/12/2024)  Housing: Unknown (07/12/2024)  Transportation Needs: Patient Declined (07/12/2024)  Utilities: Patient Declined (07/12/2024)  Social Connections: Patient Declined (07/12/2024)  Recent Concern: Social Connections - Moderately Isolated (06/07/2024)  Tobacco Use: Medium Risk (07/11/2024)   SDOH Interventions:     Readmission Risk Interventions    07/19/2024    6:07 PM 06/10/2024   10:37 AM  Readmission Risk Prevention Plan  Transportation Screening Complete Complete  PCP or Specialist Appt within 3-5 Days  Complete  HRI or Home Care Consult  Complete  Palliative Care Screening   Not Applicable  Medication Review (RN Care Manager) Complete Complete  PCP or Specialist appointment within 3-5 days of discharge Complete   HRI or Home Care Consult Complete   SW Recovery Care/Counseling Consult Complete   Palliative Care Screening Not Applicable   Skilled Nursing Facility Patient Refused

## 2024-07-19 NOTE — Progress Notes (Signed)
 PROGRESS NOTE    Ronald Kaiser  FMW:[id number] DOB: July 15, 1934 DOA: 07/10/2024 PCP: [name], [name]   Chief Complaint  Patient presents with   Shortness of Breath   Abdominal Pain    Brief Narrative:   89 year old white male, HFrEF 25-30%, Metastatic lesions from prostatic cancer on Xtandi , he is managed currently by Dr. Nieves ,has had radiation proctitis with anemia previously and underwent colonoscopy 05/28/2024 showing severe radiation proctitis with APC prohibiting full colonoscopy-at the time was given Carafate and instructed to follow-up as an outpatient Permanent A-fib not on anticoagulation complicated by severe orthostasis precluding rate control and not a good candidate for anticoagulation because of history of recent GI bleed. -Patient with multiple recent hospitalization last August and September due to GI bleed radiation proctitis and UTI. -Patient presents to ED with lower abdominal pain, workup significant for UTI/cystitis, with abdominal wall rectus sheath abscess, further workup also showing evidence of metastatic prostate disease, please see discussion below.     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 - Appears to be much improved on physical exam, no swelling, tenderness or erythema  . - Continue with as needed pain regimen . - MRI pelvis done 10/14, just was read, with concern of surrounding osteomyelitisPelvic rami and pubic area, will consult ID regarding recommendation for antibiotics treatment. - Concern for continuity of anterior lumen of urinary bladder with pubic symphysis and anterior abdominal wall abscess, urology consulted.  Surgical candidate - ED consulted regarding antibiotic recommendation,  will need prolonged IV antibiotics on discharge, will need total of 6 weeks, TOC discussing with patient this can be arranged at home, or will need to be arranged at facility.  Anemia, multifactorial due to malignancy and chronic blood loss anemia -Continue with supportive transfusion, received 1 unit 10/ - Hemoglobin remained stable  Metastatic prostate cancer XRT resection - Urology/Oncolgy/radiation oncology input greatly appreciated - Ok to resume Orgovyx and Xtandi  per urology, discussed with pharmacy to resume - Patient requested palliative medicine consult  Radiation associated pubic symphysis ORN - Rad/onc input greatly appreciated, started on Trental and vitamin E - can consider bisphosphonate in 6 to 8 weeks  Atrial fibrillation CHADVASC >4 not on anticoagulation has bled score >4, recent hospitalization for associated hematoma and bleed due to radiation proctitis  CAD  chronic HFrEF -Continues midodrine  to 10 milligram 3 times daily  -No GDMT f at this time-is not volume overloaded-his statin from admission has been held, I have held his Lasix  from prior to admission additionally - He appears to be a euvolemic  Previous radiation proctitis with GI bleed -Poor  candidacy of anticoagulation or even aspirin  at this time -Would not resume aspirin  and needs follow-up in the outpatient to discuss this -Previous fall with multiple fractures  DVT prophylaxis: (Lovenox) Code Status: (Full) Family Communication: (none at bedside) Disposition:   Status is:  Inpatient  Consultants:  Oncology General surgery Rad/ONC Cardiology Urology Infectious disease Palliative medicine   Subjective:  He denies any complaints today, but he is upset that he might not be able to go home as he will need IV antibiotics, and maybe this is difficult for him and his wife to manage and may need SNF for that Objective: Vitals:   07/18/24 2004 07/19/24 0005 07/19/24 0355 07/19/24 0800  BP: 98/66 (!) 121/58 (!) 104/59 (!) 101/49  Pulse: 83  81 83  Resp: 20  20 20   Temp: 98.1 F (36.7 C) 98.4 F (36.9 C) 98.2 F (36.8 C) 98 F (36.7 C)  TempSrc: Oral Oral Oral Oral  SpO2: 94%  95% 95%  Weight:      Height:        Intake/Output Summary (Last 24 hours) at 07/19/2024 1148 Last data filed at 07/19/2024 0549 Gross per 24 hour  Intake --  Output 1900 ml  Net -1900 ml   Filed Weights   07/10/24 1755 07/11/24 2013  Weight: 64 kg 67 kg    Examination:  Awake Alert, Oriented X 3, frail Symmetrical Chest wall movement, Good air movement bilaterally,  RRR,No Gallops,Rubs or new Murmurs, No Parasternal Heave +ve B.Sounds, Abd Soft, No tenderness No Cyanosis, Clubbing or edema, No new Rash or bruise        Data Reviewed: I have personally reviewed following labs and imaging studies  CBC: Recent Labs  Lab 07/15/24 0418 07/16/24 0258 07/17/24 0401 07/18/24 0325 07/19/24 0349  WBC 9.2 9.7 9.0 7.8 7.8  NEUTROABS 7.5 7.8* 7.4 6.4 6.2  HGB 9.2* 8.8* 8.8* 9.2* 8.7*  HCT 29.0* 28.0* 27.3* 29.2* 27.6*  MCV 92.9 93.6 93.2 94.2 93.6  PLT 306 332 294 322 319    Basic Metabolic Panel: Recent Labs  Lab 07/15/24 0418 07/16/24 0258 07/17/24 0401 07/18/24 0325 07/19/24 0349  NA 134* 135 132* 134* 134*  K 3.7 3.6 3.7 3.9 4.2  CL 98 100 100 100 99  CO2 25 25 24 25 26   GLUCOSE 109* 107* 107* 103* 117*  BUN 15 12 11 14 20   CREATININE 0.77 0.71 0.67 0.65 0.69  CALCIUM  7.9* 7.8* 7.8* 7.9* 8.2*    GFR: Estimated Creatinine Clearance: 61.6  mL/min (by C-G formula based on SCr of 0.69 mg/dL).  Liver Function Tests: Recent Labs  Lab 07/12/24 2057 07/13/24 0538 07/14/24 0516 07/15/24 0418  AST 16 15 17 17   ALT 13 13 12 13   ALKPHOS 60 57 56 52  BILITOT 0.4 0.4 0.3 0.4  PROT 5.8* 5.6* 5.7* 6.1*  ALBUMIN <1.5* <1.5* <1.5* 1.5*    CBG: No results for input(s): GLUCAP in the last 168 hours.   Recent Results (from the past 240 hours)  Blood Culture (routine x 2)     Status: None   Collection Time: 07/10/24  7:05 PM   Specimen: BLOOD  Result Value Ref Range Status   Specimen Description BLOOD RIGHT ANTECUBITAL  Final   Special Requests   Final    BOTTLES DRAWN AEROBIC AND ANAEROBIC Blood Culture adequate volume   Culture   Final    NO GROWTH 5 DAYS Performed at [facility], [address] [place], [place] [address]    Report Status 07/15/2024 FINAL  Final  Blood Culture (routine x 2)     Status: None   Collection Time: 07/10/24  7:10 PM   Specimen: BLOOD RIGHT FOREARM  Result Value Ref Range Status   Specimen Description  BLOOD RIGHT FOREARM  Final   Special Requests   Final    BOTTLES DRAWN AEROBIC AND ANAEROBIC Blood Culture adequate volume   Culture   Final    NO GROWTH 5 DAYS Performed at [facility], [address], [place], [place] [address]    Report Status 07/15/2024 FINAL  Final  Urine Culture (for pregnant, neutropenic or urologic patients or patients with an indwelling urinary catheter)     Status: Abnormal   Collection Time: 07/11/24 12:47 AM   Specimen: Urine, Clean Catch  Result Value Ref Range Status   Specimen Description URINE, CLEAN CATCH  Final   Special Requests   Final    Immunocompromised Performed at [facility], [address] [place], [place] [address]    Culture >=100,000 COLONIES/mL PROTEUS MIRABILIS (A)  Final   Report Status 07/13/2024 FINAL  Final   Organism ID, Bacteria PROTEUS MIRABILIS (A)  Final      Susceptibility   Proteus mirabilis - MIC*     AMPICILLIN <=2 SENSITIVE Sensitive     CEFAZOLIN (URINE) Value in next row Sensitive      <=1 SENSITIVEThis is a modified FDA-approved test that has been validated and its performance characteristics determined by the reporting laboratory.  This laboratory is certified under the Clinical Laboratory Improvement Amendments CLIA as qualified to perform high complexity clinical laboratory testing.    CEFEPIME  Value in next row Sensitive      <=1 SENSITIVEThis is a modified FDA-approved test that has been validated and its performance characteristics determined by the reporting laboratory.  This laboratory is certified under the Clinical Laboratory Improvement Amendments CLIA as qualified to perform high complexity clinical laboratory testing.    ERTAPENEM Value in next row Sensitive      <=1 SENSITIVEThis is a modified FDA-approved test that has been validated and its performance characteristics determined by the reporting laboratory.  This laboratory is certified under the Clinical Laboratory Improvement Amendments CLIA as qualified to perform high complexity clinical laboratory testing.    CEFTRIAXONE  Value in next row Sensitive      <=1 SENSITIVEThis is a modified FDA-approved test that has been validated and its performance characteristics determined by the reporting laboratory.  This laboratory is certified under the Clinical Laboratory Improvement Amendments CLIA as qualified to perform high complexity clinical laboratory testing.    CIPROFLOXACIN  Value in next row Sensitive      <=1 SENSITIVEThis is a modified FDA-approved test that has been validated and its performance characteristics determined by the reporting laboratory.  This laboratory is certified under the Clinical Laboratory Improvement Amendments CLIA as qualified to perform high complexity clinical laboratory testing.    GENTAMICIN  Value in next row Sensitive      <=1 SENSITIVEThis is a modified FDA-approved test that has been validated  and its performance characteristics determined by the reporting laboratory.  This laboratory is certified under the Clinical Laboratory Improvement Amendments CLIA as qualified to perform high complexity clinical laboratory testing.    NITROFURANTOIN Value in next row Resistant      <=1 SENSITIVEThis is a modified FDA-approved test that has been validated and its performance characteristics determined by the reporting laboratory.  This laboratory is certified under the Clinical Laboratory Improvement Amendments CLIA as qualified to perform high complexity clinical laboratory testing.    TRIMETH/SULFA Value in next row Sensitive      <=1 SENSITIVEThis is a modified FDA-approved test that has been validated and its performance characteristics determined  by the reporting laboratory.  This laboratory is certified under the Clinical Laboratory Improvement Amendments CLIA as qualified to perform high complexity clinical laboratory testing.    AMPICILLIN/SULBACTAM Value in next row Sensitive      <=1 SENSITIVEThis is a modified FDA-approved test that has been validated and its performance characteristics determined by the reporting laboratory.  This laboratory is certified under the Clinical Laboratory Improvement Amendments CLIA as qualified to perform high complexity clinical laboratory testing.    PIP/TAZO Value in next row Sensitive      <=4 SENSITIVEThis is a modified FDA-approved test that has been validated and its performance characteristics determined by the reporting laboratory.  This laboratory is certified under the Clinical Laboratory Improvement Amendments CLIA as qualified to perform high complexity clinical laboratory testing.    MEROPENEM Value in next row Sensitive      <=4 SENSITIVEThis is a modified FDA-approved test that has been validated and its performance characteristics determined by the reporting laboratory.  This laboratory is certified under the Clinical Laboratory Improvement  Amendments CLIA as qualified to perform high complexity clinical laboratory testing.    * >=100,000 COLONIES/mL PROTEUS MIRABILIS  Aerobic/Anaerobic Culture w Gram Stain (surgical/deep wound)     Status: None   Collection Time: 07/12/24  3:09 PM   Specimen: Abscess  Result Value Ref Range Status   Specimen Description ABSCESS  Final   Special Requests LLQ  Final   Gram Stain   Final    ABUNDANT WBC PRESENT, PREDOMINANTLY PMN RARE GRAM POSITIVE COCCI IN CLUSTERS    Culture   Final    MODERATE PROTEUS MIRABILIS FEW AEROCOCCUS SPECIES Standardized susceptibility testing for this organism is not available. Performed at [facility], [address] [place], [place] [address]    Report Status 07/17/2024 FINAL  Final   Organism ID, Bacteria PROTEUS MIRABILIS  Final      Susceptibility   Proteus mirabilis - MIC*    AMPICILLIN <=2 SENSITIVE Sensitive     CEFAZOLIN (NON-URINE) 4 INTERMEDIATE Intermediate     CEFEPIME  <=0.12 SENSITIVE Sensitive     ERTAPENEM <=0.12 SENSITIVE Sensitive     CEFTRIAXONE  <=0.25 SENSITIVE Sensitive     CIPROFLOXACIN  <=0.06 SENSITIVE Sensitive     GENTAMICIN  <=1 SENSITIVE Sensitive     MEROPENEM <=0.25 SENSITIVE Sensitive     TRIMETH/SULFA <=20 SENSITIVE Sensitive     AMPICILLIN/SULBACTAM <=2 SENSITIVE Sensitive     PIP/TAZO Value in next row Sensitive      <=4 SENSITIVEThis is a modified FDA-approved test that has been validated and its performance characteristics determined by the reporting laboratory.  This laboratory is certified under the Clinical Laboratory Improvement Amendments CLIA as qualified to perform high complexity clinical laboratory testing.    * MODERATE PROTEUS MIRABILIS         Radiology Studies: CT CYSTOGRAM ABD/PELVIS Result Date: 07/17/2024 CLINICAL DATA:  Pelvic abscess with possible communication between the bladder and the abscess. EXAM: CT CYSTOGRAM (CT ABDOMEN AND PELVIS WITH CONTRAST) TECHNIQUE: Multi-detector CT imaging  through the abdomen and pelvis was performed after dilute contrast had been introduced into the bladder for the purposes of performing CT cystography. RADIATION DOSE REDUCTION: This exam was performed according to the departmental dose-optimization program which includes automated exposure control, adjustment of the mA and/or kV according to patient size and/or use of iterative reconstruction technique. CONTRAST:  50mL OMNIPAQUE  IOHEXOL  300 MG/ML  SOLN COMPARISON:  MRI 07/16/2024. FINDINGS: Lower chest: Bronchiectasis with architectural distortion and atelectasis/scarring  noted in both lung bases, right greater. Hepatobiliary: No suspicious focal abnormality in the liver on this study without intravenous contrast. There is no evidence for gallstones, gallbladder wall thickening, or pericholecystic fluid. No intrahepatic or extrahepatic biliary dilation. Pancreas: No focal mass lesion. No dilatation of the main duct. No intraparenchymal cyst. No peripancreatic edema. Spleen: No splenomegaly. No suspicious focal mass lesion. Adrenals/Urinary Tract: No adrenal nodule or mass. Mild fullness noted right ureter without overt right hydroureteronephrosis. There is mild left hydroureteronephrosis. Contrast material in the bladder has been introduced in a retrograde fashion. Bladder is poorly distended but there is evidence for extraperitoneal extravasation of contrast into the pelvic floor with contrast material tracking anteriorly in the pelvis through the region of the symphysis pubis into a thick walled collection of fluid, gas, and extravasated contrast in the anterior pelvic wall corresponding to the described abscess on CT of 07/10/2024. the fistula between the bladder seems to tract just caudal to the symphysis pubis although it may also tract directly through the symphysis pubis itself. Stomach/Bowel: Stomach is unremarkable. No gastric wall thickening. No evidence of outlet obstruction. Duodenum is normally  positioned as is the ligament of Treitz. No small bowel wall thickening. No small bowel dilatation. The terminal ileum is normal. The appendix is normal. No gross colonic mass. No colonic wall thickening. Moderate stool volume evident. Vascular/Lymphatic: There is moderate atherosclerotic calcification of the abdominal aorta without aneurysm. There is no gastrohepatic or hepatoduodenal ligament lymphadenopathy. No retroperitoneal or mesenteric lymphadenopathy. No pelvic sidewall lymphadenopathy. Reproductive: Prostate gland not well seen. Other: No substantial intraperitoneal free fluid. Musculoskeletal: Diffuse body wall edema evident. There is erosion bony anatomy in the pubic bones bilaterally, left greater than right, raising concern for metastatic disease and/or osteomyelitis. Compression deformity noted at T11 and T12. IMPRESSION: 1. Extraperitoneal extravasation of contrast at the bladder base into the pelvic floor with contrast material tracking anteriorly in the pelvis through the region of the symphysis pubis into a thick walled collection of fluid, gas, and extravasated contrast in the anterior pelvic wall corresponding to the described abscess on CT of 07/10/2024. 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 2. Erosion of the bony anatomy in the pubic bones bilaterally, left greater than right, raising concern for metastatic disease and/or osteomyelitis. This was better characterized on recent MRI of the pelvis. 3. Mild left hydroureteronephrosis. 4. Bronchiectasis with architectural distortion and atelectasis/scarring in both lung bases, right greater than left. 5. Diffuse body wall edema. 6. Compression deformity at T11 and T12. 7.  Aortic Atherosclerosis (ICD10-I70.0). Electronically Signed   By: Camellia Candle M.D.   On: 07/17/2024 12:50        Scheduled Meds:  Chlorhexidine  Gluconate Cloth  6 each Topical Daily    enoxaparin (LOVENOX) injection  40 mg Subcutaneous Q24H   feeding supplement  237 mL Oral TID BM   ferrous sulfate   325 mg Oral Q breakfast   midodrine   10 mg Oral TID WC   multivitamin with minerals  1 tablet Oral Daily   pantoprazole   40 mg Oral Daily   pentoxifylline  400 mg Oral TID WC   pravastatin   20 mg Oral QPM   sodium chloride  flush  3 mL Intravenous Q12H   sodium chloride  flush  3 mL Intravenous Q12H   Continuous Infusions:  cefTRIAXone  (ROCEPHIN )  IV 2 g (07/18/24 1427)   vitamin E capsule 1,000 Units  LOS: 8 days      Brayton Lye, MD [facility]   To contact the attending provider between 7A-7P or the covering provider during after hours 7P-7A, please log into the web site [email or web address] and access using universal [place] password for that web site. If you do not have the password, please call the hospital operator.  07/19/2024, 11:48 AM

## 2024-07-19 NOTE — Evaluation (Signed)
 Occupational Therapy Evaluation Patient Details Name: Ronald Kaiser MRN: [id number] DOB: [birth year] Today's Date: 07/19/2024   History of Present Illness   89 year old white male on 10/8 brought to ED with lower abdominal pain radiating down to his legs over the past 7 to 10 days improves when he does not move or do any exercises associated with shortness of breath occasionally--- he was found to have rectus sheath hematoma/abscess/other on the CT angio as dictated below for this indication because of severe pain. PMH: s/p colonoscopy and EGD 8/26, prostate Cancer, MI, HFrEF, HTN, HLD, CAD     Clinical Impressions Pt resting in bed, c/o no pain. Pt lives with wife, level entry from the garage, ramp with 1 STE from front porch, PLOF mod I. Pt currently close to baseline, with some decreased activity tolerance. Overall set up/supervision for ADLs, uses rollator for mobility. Pt at this time has no further acute OT needs, no OT follow up, mobility to follow acutely to maximize activity tolerance. Pt reports sharing a rollator with his wife at home, would benefit from a rollator for return home to maximize safety with mobility.     If plan is discharge home, recommend the following:   A little help with bathing/dressing/bathroom;Assistance with cooking/housework;Assist for transportation     Functional Status Assessment   Patient has had a recent decline in their functional status and demonstrates the ability to make significant improvements in function in a reasonable and predictable amount of time.     Equipment Recommendations   Other (comment) (rollator)     Recommendations for Other Services         Precautions/Restrictions   Precautions Precautions: Fall Recall of Precautions/Restrictions: Intact Restrictions Weight Bearing Restrictions Per Provider Order: No     Mobility Bed Mobility Overal bed mobility: Modified Independent             General bed  mobility comments: increased time    Transfers Overall transfer level: Modified independent Equipment used: Rollator (4 wheels)                      Balance Overall balance assessment: Mild deficits observed, not formally tested                                         ADL either performed or assessed with clinical judgement   ADL Overall ADL's : Needs assistance/impaired                                       General ADL Comments: set up/supervision, good overall strength and ROM, decreased activity tolerance     Vision Baseline Vision/History: 1 Wears glasses Ability to See in Adequate Light: 0 Adequate Patient Visual Report: No change from baseline       Perception         Praxis         Pertinent Vitals/Pain Pain Assessment Pain Assessment: No/denies pain     Extremity/Trunk Assessment Upper Extremity Assessment Upper Extremity Assessment: Overall WFL for tasks assessed           Communication Communication Communication: Impaired Factors Affecting Communication: Hearing impaired   Cognition Arousal: Alert Behavior During Therapy: WFL for tasks assessed/performed Cognition: No apparent impairments  Following commands: Intact       Cueing  General Comments   Cueing Techniques: Verbal cues      Exercises     Shoulder Instructions      Home Living Family/patient expects to be discharged to:: Private residence Living Arrangements: Spouse/significant other Available Help at Discharge: Family;Available 24 hours/day Type of Home: House Home Access: Stairs to enter [employer] of Steps: 1 step at front, level from garage Entrance Stairs-Rails: None Home Layout: One level     Bathroom Shower/Tub: Tub/shower unit;Walk-in shower   Bathroom Toilet: Handicapped height Bathroom Accessibility: Yes How Accessible: Accessible via walker Home Equipment:  Rollator (4 wheels);Shower seat;Grab bars - tub/shower;Hand held shower head          Prior Functioning/Environment Prior Level of Function : Independent/Modified Independent;Driving             Mobility Comments: Use of rollator ADLs Comments: independent in ADL, IADL, medication management; asssists wife as needed at home    OT Problem List: Decreased strength;Decreased activity tolerance   OT Treatment/Interventions:        OT Goals(Current goals can be found in the care plan section)   Acute Rehab OT Goals Patient Stated Goal: to return home OT Goal Formulation: With patient Time For Goal Achievement: 08/02/24 Potential to Achieve Goals: Good   OT Frequency:       Co-evaluation              AM-PAC OT 6 Clicks Daily Activity     Outcome Measure Help from another person eating meals?: None Help from another person taking care of personal grooming?: A Little Help from another person toileting, which includes using toliet, bedpan, or urinal?: A Little Help from another person bathing (including washing, rinsing, drying)?: A Little Help from another person to put on and taking off regular upper body clothing?: A Little Help from another person to put on and taking off regular lower body clothing?: A Little 6 Click Score: 19   End of Session Equipment Utilized During Treatment: Gait belt;Rollator (4 wheels) Nurse Communication: Mobility status  Activity Tolerance: Patient tolerated treatment well Patient left: in bed;with call bell/phone within reach  OT Visit Diagnosis: Unsteadiness on feet (R26.81);Muscle weakness (generalized) (M62.81)                Time: 8960-8894 OT Time Calculation (min): 26 min Charges:  OT General Charges $OT Visit: 1 Visit OT Evaluation $OT Eval Low Complexity: 1 Low OT Treatments $Self Care/Home Management : 8-22 mins  [place], OTR/L   Elouise JONELLE Bott 07/19/2024, 11:39 AM

## 2024-07-19 NOTE — Progress Notes (Signed)
 Patient had a run of SVT. Now back in NSR. MD aware. Patient asymptomatic. VS stable.

## 2024-07-20 ENCOUNTER — Other Ambulatory Visit: Payer: Self-pay

## 2024-07-20 DIAGNOSIS — L02211 Cutaneous abscess of abdominal wall: Secondary | ICD-10-CM | POA: Diagnosis not present

## 2024-07-20 MED ORDER — ONDANSETRON HCL 4 MG/2ML IJ SOLN: 4.0000 mg | Freq: Once | INTRAMUSCULAR | Status: DC

## 2024-07-20 NOTE — Progress Notes (Signed)
 PROGRESS NOTE    Ronald Kaiser  FMW:[id number] DOB: [birth year] DOA: 07/10/2024 PCP: [name], [name]   Chief Complaint  Patient presents with   Shortness of Breath   Abdominal Pain    Brief Narrative:   89 year old white male, HFrEF 25-30%, Metastatic lesions from prostatic cancer on Xtandi , he is managed currently by Dr. Nieves ,has had radiation proctitis with anemia previously and underwent colonoscopy 05/28/2024 showing severe radiation proctitis with APC prohibiting full colonoscopy-at the time was given Carafate and instructed to follow-up as an outpatient Permanent A-fib not on anticoagulation complicated by severe orthostasis precluding rate control and not a good candidate for anticoagulation because of history of recent GI bleed. -Patient with multiple recent hospitalization last August and September due to GI bleed radiation proctitis and UTI. -Patient presents to ED with lower abdominal pain, workup significant for UTI/cystitis, with abdominal wall rectus sheath abscess, further workup also showing evidence of metastatic prostate disease, please see discussion below.     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 - Appears to be much improved on physical exam, no swelling, tenderness or erythema  . - Continue with as needed pain regimen . - MRI pelvis done 10/14, just was read, with concern of surrounding osteomyelitisPelvic rami and pubic area, will consult ID regarding recommendation for antibiotics treatment. - Concern for continuity of anterior lumen of urinary bladder with pubic symphysis and anterior abdominal wall abscess, urology consulted.  Surgical candidate - ED consulted regarding antibiotic recommendation,  will need prolonged IV antibiotics on discharge, will need total of 6 weeks, TOC discussing with patient this can be arranged at home, or will need to be arranged at facility.  Home health agent will be on Monday to talk to the patient about IV antibiotics to see if he can do it at home.  Anemia, multifactorial due to malignancy and chronic blood loss anemia -Continue with supportive transfusion, received 1 unit 10/ - Hemoglobin remained stable  Metastatic prostate cancer XRT resection - Urology/Oncolgy/radiation oncology input greatly appreciated - Ok to resume Orgovyx and Xtandi  per urology, discussed with pharmacy to resume - Patient requested palliative medicine consult  Radiation associated pubic symphysis ORN - Rad/onc input greatly appreciated, started on Trental and vitamin E - can consider bisphosphonate in 6 to 8 weeks  Atrial fibrillation CHADVASC >4 not on anticoagulation has bled score >4, recent hospitalization for associated hematoma and bleed due to radiation proctitis  CAD  chronic HFrEF -Continues midodrine  to 10 milligram 3 times daily  -No GDMT f at this time-is not volume overloaded-his statin from admission has been held, I have held his Lasix  from prior to admission additionally - He appears to be a euvolemic  Previous radiation proctitis with GI bleed -Poor  candidacy of anticoagulation or even aspirin  at this time -Would not resume aspirin  and needs follow-up in the outpatient to discuss this -Previous fall  with multiple fractures  DVT  prophylaxis: (Lovenox) Code Status: (Full) Family Communication: (none at bedside) Disposition:   Status is: Inpatient    Consultants:  Oncology General surgery Rad/ONC Cardiology Urology Infectious disease Palliative medicine   Subjective:  No complaints overnight, no significant event Objective: Vitals:   07/20/24 0400 07/20/24 0800 07/20/24 1147 07/20/24 1148  BP: (!) 104/53 104/60 (!) 106/53 (!) 106/53  Pulse: 80 73 84 89  Resp: 20 (!) 24 (!) 26 17  Temp: 98.8 F (37.1 C) (!) 97.4 F (36.3 C) 97.9 F (36.6 C)   TempSrc: Oral Oral Oral   SpO2: 94% 95% 95% 97%  Weight:      Height:        Intake/Output Summary (Last 24 hours) at 07/20/2024 1202 Last data filed at 07/20/2024 0941 Gross per 24 hour  Intake 3 ml  Output 1700 ml  Net -1697 ml   Filed Weights   07/10/24 1755 07/11/24 2013  Weight: 64 kg 67 kg    Examination:  Awake Alert, Oriented X 3, No new F.N deficits, Normal affect Symmetrical Chest wall movement, Good air movement bilaterally RRR,No Gallops,Rubs or new Murmurs, No Parasternal Heave +ve B.Sounds, Abd Soft, No tenderness No Cyanosis, Clubbing or edema, No new Rash or bruise         Data Reviewed: I have personally reviewed following labs and imaging studies  CBC: Recent Labs  Lab 07/15/24 0418 07/16/24 0258 07/17/24 0401 07/18/24 0325 07/19/24 0349  WBC 9.2 9.7 9.0 7.8 7.8  NEUTROABS 7.5 7.8* 7.4 6.4 6.2  HGB 9.2* 8.8* 8.8* 9.2* 8.7*  HCT 29.0* 28.0* 27.3* 29.2* 27.6*  MCV 92.9 93.6 93.2 94.2 93.6  PLT 306 332 294 322 319    Basic Metabolic Panel: Recent Labs  Lab 07/15/24 0418 07/16/24 0258 07/17/24 0401 07/18/24 0325 07/19/24 0349  NA 134* 135 132* 134* 134*  K 3.7 3.6 3.7 3.9 4.2  CL 98 100 100 100 99  CO2 25 25 24 25 26   GLUCOSE 109* 107* 107* 103* 117*  BUN 15 12 11 14 20   CREATININE 0.77 0.71 0.67 0.65 0.69  CALCIUM  7.9* 7.8* 7.8* 7.9* 8.2*    GFR: Estimated Creatinine Clearance: 61.6 mL/min (by  C-G formula based on SCr of 0.69 mg/dL).  Liver Function Tests: Recent Labs  Lab 07/14/24 0516 07/15/24 0418  AST 17 17  ALT 12 13  ALKPHOS 56 52  BILITOT 0.3 0.4  PROT 5.7* 6.1*  ALBUMIN <1.5* 1.5*    CBG: No results for input(s): GLUCAP in the last 168 hours.   Recent Results (from the past 240 hours)  Blood Culture (routine x 2)     Status: None   Collection Time: 07/10/24  7:05 PM   Specimen: BLOOD  Result Value Ref Range Status   Specimen Description BLOOD RIGHT ANTECUBITAL  Final   Special Requests   Final    BOTTLES DRAWN AEROBIC AND ANAEROBIC Blood Culture adequate volume   Culture   Final    NO GROWTH 5 DAYS Performed at [facility], [address], [place], [place] [address]    Report Status 07/15/2024 FINAL  Final  Blood Culture (routine x 2)     Status: None   Collection Time: 07/10/24  7:10 PM   Specimen: BLOOD RIGHT FOREARM  Result Value Ref Range Status   Specimen Description BLOOD RIGHT FOREARM  Final   Special Requests   Final    BOTTLES DRAWN AEROBIC AND ANAEROBIC Blood  Culture adequate volume   Culture   Final    NO GROWTH 5 DAYS Performed at [facility], [address] [place], [place] [address]    Report Status 07/15/2024 FINAL  Final  Urine Culture (for pregnant, neutropenic or urologic patients or patients with an indwelling urinary catheter)     Status: Abnormal   Collection Time: 07/11/24 12:47 AM   Specimen: Urine, Clean Catch  Result Value Ref Range Status   Specimen Description URINE, CLEAN CATCH  Final   Special Requests   Final    Immunocompromised Performed at [facility], [address] [place], [place] [address]    Culture >=100,000 COLONIES/mL PROTEUS MIRABILIS (A)  Final   Report Status 07/13/2024 FINAL  Final   Organism ID, Bacteria PROTEUS MIRABILIS (A)  Final      Susceptibility   Proteus mirabilis - MIC*    AMPICILLIN <=2 SENSITIVE Sensitive     CEFAZOLIN (URINE) Value in next row Sensitive       <=1 SENSITIVEThis is a modified FDA-approved test that has been validated and its performance characteristics determined by the reporting laboratory.  This laboratory is certified under the Clinical Laboratory Improvement Amendments CLIA as qualified to perform high complexity clinical laboratory testing.    CEFEPIME  Value in next row Sensitive      <=1 SENSITIVEThis is a modified FDA-approved test that has been validated and its performance characteristics determined by the reporting laboratory.  This laboratory is certified under the Clinical Laboratory Improvement Amendments CLIA as qualified to perform high complexity clinical laboratory testing.    ERTAPENEM Value in next row Sensitive      <=1 SENSITIVEThis is a modified FDA-approved test that has been validated and its performance characteristics determined by the reporting laboratory.  This laboratory is certified under the Clinical Laboratory Improvement Amendments CLIA as qualified to perform high complexity clinical laboratory testing.    CEFTRIAXONE  Value in next row Sensitive      <=1 SENSITIVEThis is a modified FDA-approved test that has been validated and its performance characteristics determined by the reporting laboratory.  This laboratory is certified under the Clinical Laboratory Improvement Amendments CLIA as qualified to perform high complexity clinical laboratory testing.    CIPROFLOXACIN  Value in next row Sensitive      <=1 SENSITIVEThis is a modified FDA-approved test that has been validated and its performance characteristics determined by the reporting laboratory.  This laboratory is certified under the Clinical Laboratory Improvement Amendments CLIA as qualified to perform high complexity clinical laboratory testing.    GENTAMICIN  Value in next row Sensitive      <=1 SENSITIVEThis is a modified FDA-approved test that has been validated and its performance characteristics determined by the reporting laboratory.  This laboratory  is certified under the Clinical Laboratory Improvement Amendments CLIA as qualified to perform high complexity clinical laboratory testing.    NITROFURANTOIN Value in next row Resistant      <=1 SENSITIVEThis is a modified FDA-approved test that has been validated and its performance characteristics determined by the reporting laboratory.  This laboratory is certified under the Clinical Laboratory Improvement Amendments CLIA as qualified to perform high complexity clinical laboratory testing.    TRIMETH/SULFA Value in next row Sensitive      <=1 SENSITIVEThis is a modified FDA-approved test that has been validated and its performance characteristics determined by the reporting laboratory.  This laboratory is certified under the Clinical Laboratory Improvement Amendments CLIA as qualified to perform high  complexity clinical laboratory testing.    AMPICILLIN/SULBACTAM Value in next row Sensitive      <=1 SENSITIVEThis is a modified FDA-approved test that has been validated and its performance characteristics determined by the reporting laboratory.  This laboratory is certified under the Clinical Laboratory Improvement Amendments CLIA as qualified to perform high complexity clinical laboratory testing.    PIP/TAZO Value in next row Sensitive      <=4 SENSITIVEThis is a modified FDA-approved test that has been validated and its performance characteristics determined by the reporting laboratory.  This laboratory is certified under the Clinical Laboratory Improvement Amendments CLIA as qualified to perform high complexity clinical laboratory testing.    MEROPENEM Value in next row Sensitive      <=4 SENSITIVEThis is a modified FDA-approved test that has been validated and its performance characteristics determined by the reporting laboratory.  This laboratory is certified under the Clinical Laboratory Improvement Amendments CLIA as qualified to perform high complexity clinical laboratory testing.    * >=100,000  COLONIES/mL PROTEUS MIRABILIS  Aerobic/Anaerobic Culture w Gram Stain (surgical/deep wound)     Status: None   Collection Time: 07/12/24  3:09 PM   Specimen: Abscess  Result Value Ref Range Status   Specimen Description ABSCESS  Final   Special Requests LLQ  Final   Gram Stain   Final    ABUNDANT WBC PRESENT, PREDOMINANTLY PMN RARE GRAM POSITIVE COCCI IN CLUSTERS    Culture   Final    MODERATE PROTEUS MIRABILIS FEW AEROCOCCUS SPECIES Standardized susceptibility testing for this organism is not available. Performed at [facility], [address] [place], [place] [address]    Report Status 07/17/2024 FINAL  Final   Organism ID, Bacteria PROTEUS MIRABILIS  Final      Susceptibility   Proteus mirabilis - MIC*    AMPICILLIN <=2 SENSITIVE Sensitive     CEFAZOLIN (NON-URINE) 4 INTERMEDIATE Intermediate     CEFEPIME  <=0.12 SENSITIVE Sensitive     ERTAPENEM <=0.12 SENSITIVE Sensitive     CEFTRIAXONE  <=0.25 SENSITIVE Sensitive     CIPROFLOXACIN  <=0.06 SENSITIVE Sensitive     GENTAMICIN  <=1 SENSITIVE Sensitive     MEROPENEM <=0.25 SENSITIVE Sensitive     TRIMETH/SULFA <=20 SENSITIVE Sensitive     AMPICILLIN/SULBACTAM <=2 SENSITIVE Sensitive     PIP/TAZO Value in next row Sensitive      <=4 SENSITIVEThis is a modified FDA-approved test that has been validated and its performance characteristics determined by the reporting laboratory.  This laboratory is certified under the Clinical Laboratory Improvement Amendments CLIA as qualified to perform high complexity clinical laboratory testing.    * MODERATE PROTEUS MIRABILIS         Radiology Studies: No results found.       Scheduled Meds:  Chlorhexidine  Gluconate Cloth  6 each Topical Daily   enoxaparin (LOVENOX) injection  40 mg Subcutaneous Q24H   enzalutamide   160 mg Oral Daily   feeding supplement  237 mL Oral TID BM   ferrous sulfate   325 mg Oral Q breakfast   midodrine   10 mg Oral TID WC   multivitamin with  minerals  1 tablet Oral Daily   pantoprazole   40 mg Oral Daily   pentoxifylline  400 mg Oral TID WC   pravastatin   20 mg Oral QPM   relugolix  120 mg Oral Daily   sodium chloride  flush  3 mL Intravenous Q12H   sodium chloride  flush  3 mL Intravenous Q12H   Continuous  Infusions:  cefTRIAXone  (ROCEPHIN )  IV 2 g (07/19/24 1348)   vitamin E capsule 1,000 Units       LOS: 9 days      Brayton Lye, MD [facility]   To contact the attending provider between 7A-7P or the covering provider during after hours 7P-7A, please log into the web site [email or web address] and access using universal [place] password for that web site. If you do not have the password, please call the hospital operator.  07/20/2024, 12:02 PM

## 2024-07-20 NOTE — Plan of Care (Signed)
   Problem: Clinical Measurements: Goal: Diagnostic test results will improve Outcome: Progressing

## 2024-07-20 NOTE — Plan of Care (Signed)
  Problem: Education: Goal: Knowledge of General Education information will improve Description: Including pain rating scale, medication(s)/side effects and non-pharmacologic comfort measures Outcome: Progressing   Problem: Clinical Measurements: Goal: Ability to maintain clinical measurements within normal limits will improve Outcome: Progressing Goal: Diagnostic test results will improve Outcome: Progressing   Problem: Activity: Goal: Risk for activity intolerance will decrease Outcome: Progressing   Problem: Elimination: Goal: Will not experience complications related to urinary retention Outcome: Progressing

## 2024-07-20 NOTE — Progress Notes (Addendum)
   07/20/24 1403  Mobility  Activity Ambulated with assistance  Level of Assistance Standby assist, set-up cues, supervision of patient - no hands on  Assistive Device Four wheel walker  Distance Ambulated (ft) 200 ft  Activity Response Tolerated fair  Mobility Referral Yes  Mobility visit 1 Mobility  Mobility Specialist Start Time (ACUTE ONLY) 1403  Mobility Specialist Stop Time (ACUTE ONLY) 1428  Mobility Specialist Time Calculation (min) (ACUTE ONLY) 25 min   Mobility Specialist: Progress Note  Pt agreeable to mobility session - received in bed. Pt was asymptomatic throughout session with no complaints. Returned to chair with all needs met - call bell within reach.   Additional comments:  Virgle Boards, BS Mobility Specialist Please contact via SecureChat or  Rehab office at [phone].

## 2024-07-20 NOTE — Progress Notes (Addendum)
 PICC consent obtained from pt.  Plan on placement Sunday.  Pt currently as 2 PIV's working well.  RN notified

## 2024-07-21 DIAGNOSIS — L02211 Cutaneous abscess of abdominal wall: Secondary | ICD-10-CM | POA: Diagnosis not present

## 2024-07-21 MED ORDER — SODIUM CHLORIDE 0.9% FLUSH: 10.0000 mL | INTRAVENOUS | Status: DC | PRN

## 2024-07-21 NOTE — Progress Notes (Signed)
 PROGRESS NOTE    Ronald Kaiser  FMW:[id number] DOB: 01-26-1934 DOA: 07/10/2024 PCP: [name], [name]   Chief Complaint  Patient presents with   Shortness of Breath   Abdominal Pain    Brief Narrative:   89 year old white male, HFrEF 25-30%, Metastatic lesions from prostatic cancer on Xtandi , he is managed currently by Dr. Nieves ,has had radiation proctitis with anemia previously and underwent colonoscopy 05/28/2024 showing severe radiation proctitis with APC prohibiting full colonoscopy-at the time was given Carafate and instructed to follow-up as an outpatient Permanent A-fib not on anticoagulation complicated by severe orthostasis precluding rate control and not a good candidate for anticoagulation because of history of recent GI bleed. -Patient with multiple recent hospitalization last August and September due to GI bleed radiation proctitis and UTI. -Patient presents to ED with lower abdominal pain, workup significant for UTI/cystitis, with abdominal wall rectus sheath abscess, further workup also showing evidence of metastatic prostate disease, please see discussion below.     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 - Appears to be much improved on physical exam, no swelling, tenderness or erythema  . - Continue with as needed pain regimen . - MRI pelvis done 10/14, just was read, with concern of surrounding osteomyelitisPelvic rami and pubic area, will consult ID regarding recommendation for antibiotics treatment. - Concern for continuity of anterior lumen of urinary bladder with pubic symphysis and anterior abdominal wall abscess, urology consulted.  Surgical candidate - ED consulted regarding antibiotic recommendation,  will need prolonged IV antibiotics on discharge, will need total of 6 weeks, TOC discussing with patient this can be arranged at home, or will need to be arranged at facility.  Home health agent will be on Monday to talk to the patient about IV antibiotics to see if he can do it at home. - Will request PICC line insertion today  Anemia, multifactorial due to malignancy and chronic blood loss anemia -Continue with supportive transfusion, received 1 unit infusion during hospital stay, so far hemoglobin remained stable - Hemoglobin remained stable  Metastatic prostate cancer XRT resection - Urology/Oncolgy/radiation oncology input greatly appreciated - Ok to resume Orgovyx and Xtandi  per urology, discussed with pharmacy to resume - Palliative medicine consult requested upon patient request  Radiation associated pubic symphysis ORN - Rad/onc input greatly appreciated, started on Trental and vitamin E - can consider bisphosphonate in 6 to 8 weeks  Atrial fibrillation CHADVASC >4 not on anticoagulation has bled score >4, recent hospitalization for associated hematoma and bleed due to radiation proctitis  CAD  chronic HFrEF -Continues midodrine  to 10 milligram 3 times daily  -No GDMT f at this time-is not volume overloaded-his statin from admission has been held, I have held his Lasix  from prior to admission additionally - He appears to be a euvolemic  Previous radiation proctitis with GI bleed -Poor  candidacy of anticoagulation or even aspirin  at  this time -Would not  resume aspirin  and needs follow-up in the outpatient to discuss this -Previous fall with multiple fractures  DVT prophylaxis: (Lovenox) Code Status: (Full) Family Communication: (none at bedside, discussed with wife by phone 10/18) Disposition:   Status is: Inpatient    Consultants:  Oncology General surgery Rad/ONC Cardiology Urology Infectious disease Palliative medicine   Subjective:  No complaints overnight, no significant event, he reports good appetite, ambulated in the hallway yesterday with staff Objective: Vitals:   07/20/24 1935 07/21/24 0000 07/21/24 0400 07/21/24 0800  BP: (!) 111/51 (!) 109/49 (!) 120/48   Pulse: 81 84 71   Resp: 20 (!) 21 (!) 21   Temp: 98.2 F (36.8 C) 98.4 F (36.9 C) 97.9 F (36.6 C) 97.6 F (36.4 C)  TempSrc: Oral Oral Oral Oral  SpO2: 93% 95% 93%   Weight:      Height:        Intake/Output Summary (Last 24 hours) at 07/21/2024 1130 Last data filed at 07/21/2024 9078 Gross per 24 hour  Intake 3 ml  Output 2150 ml  Net -2147 ml   Filed Weights   07/10/24 1755 07/11/24 2013  Weight: 64 kg 67 kg    Examination:  Awake Alert, Oriented X 3, No new F.N deficits, Normal affect Symmetrical Chest wall movement, Good air movement bilaterally RRR,No Gallops,Rubs or new Murmurs, No Parasternal Heave +ve B.Sounds, Abd Soft, No tenderness No Cyanosis, Clubbing or edema, No new Rash or bruise          Data Reviewed: I have personally reviewed following labs and imaging studies  CBC: Recent Labs  Lab 07/15/24 0418 07/16/24 0258 07/17/24 0401 07/18/24 0325 07/19/24 0349  WBC 9.2 9.7 9.0 7.8 7.8  NEUTROABS 7.5 7.8* 7.4 6.4 6.2  HGB 9.2* 8.8* 8.8* 9.2* 8.7*  HCT 29.0* 28.0* 27.3* 29.2* 27.6*  MCV 92.9 93.6 93.2 94.2 93.6  PLT 306 332 294 322 319    Basic Metabolic Panel: Recent Labs  Lab 07/15/24 0418 07/16/24 0258 07/17/24 0401 07/18/24 0325 07/19/24 0349  NA 134* 135 132* 134* 134*  K 3.7 3.6 3.7 3.9 4.2   CL 98 100 100 100 99  CO2 25 25 24 25 26   GLUCOSE 109* 107* 107* 103* 117*  BUN 15 12 11 14 20   CREATININE 0.77 0.71 0.67 0.65 0.69  CALCIUM  7.9* 7.8* 7.8* 7.9* 8.2*    GFR: Estimated Creatinine Clearance: 61.6 mL/min (by C-G formula based on SCr of 0.69 mg/dL).  Liver Function Tests: Recent Labs  Lab 07/15/24 0418  AST 17  ALT 13  ALKPHOS 52  BILITOT 0.4  PROT 6.1*  ALBUMIN 1.5*    CBG: No results for input(s): GLUCAP in the last 168 hours.   Recent Results (from the past 240 hours)  Aerobic/Anaerobic Culture w Gram Stain (surgical/deep wound)     Status: None   Collection Time: 07/12/24  3:09 PM   Specimen: Abscess  Result Value Ref Range Status   Specimen Description ABSCESS  Final   Special Requests LLQ  Final   Gram Stain   Final    ABUNDANT WBC PRESENT, PREDOMINANTLY PMN RARE GRAM POSITIVE COCCI IN CLUSTERS    Culture   Final    MODERATE PROTEUS MIRABILIS FEW AEROCOCCUS SPECIES Standardized susceptibility testing for this organism is not available. Performed at [facility], [address] [place], [place] [address]    Report Status 07/17/2024 FINAL  Final   Organism ID, Bacteria PROTEUS MIRABILIS  Final      Susceptibility   Proteus mirabilis - MIC*    AMPICILLIN <=2 SENSITIVE Sensitive     CEFAZOLIN (NON-URINE) 4 INTERMEDIATE Intermediate     CEFEPIME  <=0.12 SENSITIVE Sensitive     ERTAPENEM <=0.12 SENSITIVE Sensitive     CEFTRIAXONE  <=0.25 SENSITIVE Sensitive     CIPROFLOXACIN  <=0.06 SENSITIVE Sensitive     GENTAMICIN  <=1 SENSITIVE Sensitive     MEROPENEM <=0.25 SENSITIVE Sensitive     TRIMETH/SULFA <=20 SENSITIVE Sensitive     AMPICILLIN/SULBACTAM <=2 SENSITIVE Sensitive     PIP/TAZO Value in next row Sensitive      <=4 SENSITIVEThis is a modified FDA-approved test that has been validated and its performance characteristics determined by the reporting laboratory.  This laboratory is certified under the Clinical Laboratory Improvement  Amendments CLIA as qualified to perform high complexity clinical laboratory testing.    * MODERATE PROTEUS MIRABILIS         Radiology Studies: US  EKG SITE RITE Result Date: 07/20/2024 If Site Rite image not attached, placement could not be confirmed due to current cardiac rhythm.        Scheduled Meds:  Chlorhexidine  Gluconate Cloth  6 each Topical Daily   enoxaparin (LOVENOX) injection  40 mg Subcutaneous Q24H   enzalutamide   160 mg Oral Daily   feeding supplement  237 mL Oral TID BM   ferrous sulfate   325 mg Oral Q breakfast   midodrine   10 mg Oral TID WC   multivitamin with minerals  1 tablet Oral Daily   pantoprazole   40 mg Oral Daily   pentoxifylline  400 mg Oral TID WC   pravastatin   20 mg Oral QPM   relugolix  120 mg Oral Daily   sodium chloride  flush  3 mL Intravenous Q12H   sodium chloride  flush  3 mL Intravenous Q12H   Continuous Infusions:  cefTRIAXone  (ROCEPHIN )  IV 2 g (07/20/24 1407)   vitamin E capsule 1,000 Units       LOS: 10 days      Brayton Lye, MD [facility]   To contact the attending provider between 7A-7P or the covering provider during after hours 7P-7A, please log into the web site [email or web address] and access using universal [place] password for that web site. If you do not have the password, please call the hospital operator.  07/21/2024, 11:30 AM

## 2024-07-21 NOTE — Progress Notes (Signed)
 Peripherally Inserted Central Catheter Placement  The IV Nurse has discussed with the patient and/or persons authorized to consent for the patient, the purpose of this procedure and the potential benefits and risks involved with this procedure.  The benefits include less needle sticks, lab draws from the catheter, and the patient may be discharged home with the catheter. Risks include, but not limited to, infection, bleeding, blood clot (thrombus formation), and puncture of an artery; nerve damage and irregular heartbeat and possibility to perform a PICC exchange if needed/ordered by physician.  Alternatives to this procedure were also discussed.  Bard Power PICC patient education guide, fact sheet on infection prevention and patient information card has been provided to patient /or left at bedside.    PICC Placement Documentation  PICC Single Lumen 07/21/24 Right Brachial 39 cm 0 cm (Active)  Indication for Insertion or Continuance of Line Home intravenous therapies (PICC only) 07/21/24 1609  Exposed Catheter (cm) 0 cm 07/21/24 1609  Site Assessment Clean, Dry, Intact 07/21/24 1609  Line Status Flushed;Saline locked;Blood return noted 07/21/24 1609  Dressing Type Transparent;Securing device 07/21/24 1609  Dressing Status Antimicrobial disc/dressing in place;Clean, Dry, Intact 07/21/24 1609  Line Care Tubing changed;Cap changed;Connections checked and tightened 07/21/24 1609  Line Adjustment (NICU/IV Team Only) No 07/21/24 1609  Dressing Intervention New dressing;Adhesive placed at insertion site (IV team only);Adhesive placed around edges of dressing (IV team/ICU RN only) 07/21/24 1609  Dressing Change Due 07/28/24 07/21/24 1609       Ronald Kaiser 07/21/2024, 4:09 PM

## 2024-07-21 NOTE — Plan of Care (Signed)
   Problem: Clinical Measurements: Goal: Diagnostic test results will improve Outcome: Progressing   Problem: Safety: Goal: Ability to remain free from injury will improve Outcome: Progressing   Problem: Skin Integrity: Goal: Risk for impaired skin integrity will decrease Outcome: Progressing

## 2024-07-22 DIAGNOSIS — L02211 Cutaneous abscess of abdominal wall: Secondary | ICD-10-CM | POA: Diagnosis not present

## 2024-07-22 LAB — CBC
HCT: 27.8 % — ABNORMAL LOW (ref 39.0–52.0)
Hemoglobin: 8.6 g/dL — ABNORMAL LOW (ref 13.0–17.0)
MCH: 29.8 pg (ref 26.0–34.0)
MCHC: 30.9 g/dL (ref 30.0–36.0)
MCV: 96.2 fL (ref 80.0–100.0)
Platelets: 323 K/uL (ref 150–400)
RBC: 2.89 MIL/uL — ABNORMAL LOW (ref 4.22–5.81)
RDW: 19.9 % — ABNORMAL HIGH (ref 11.5–15.5)
WBC: 7.4 K/uL (ref 4.0–10.5)
nRBC: 0 % (ref 0.0–0.2)

## 2024-07-22 LAB — BASIC METABOLIC PANEL WITH GFR
Anion gap: 9 (ref 5–15)
BUN: 24 mg/dL — ABNORMAL HIGH (ref 8–23)
CO2: 28 mmol/L (ref 22–32)
Calcium: 8.4 mg/dL — ABNORMAL LOW (ref 8.9–10.3)
Chloride: 96 mmol/L — ABNORMAL LOW (ref 98–111)
Creatinine, Ser: 0.62 mg/dL (ref 0.61–1.24)
GFR, Estimated: >60 mL/min (ref >60)
Glucose, Bld: 127 mg/dL — ABNORMAL HIGH (ref 70–99)
Potassium: 3.9 mmol/L (ref 3.5–5.1)
Sodium: 133 mmol/L — ABNORMAL LOW (ref 135–145)

## 2024-07-22 NOTE — Plan of Care (Signed)
  Problem: Clinical Measurements: Goal: Will remain free from infection Outcome: Progressing   Problem: Safety: Goal: Ability to remain free from injury will improve Outcome: Progressing   

## 2024-07-22 NOTE — Progress Notes (Signed)
 PROGRESS NOTE    Ronald Kaiser  FMW:[id number] DOB: 10-09-1933 DOA: 07/10/2024 PCP: [name], [name]   Chief Complaint  Patient presents with   Shortness of Breath   Abdominal Pain    Brief Narrative:   89 year old white male, HFrEF 25-30%, Metastatic lesions from prostatic cancer on Xtandi , he is managed currently by Dr. Nieves ,has had radiation proctitis with anemia previously and underwent colonoscopy 05/28/2024 showing severe radiation proctitis with APC prohibiting full colonoscopy-at the time was given Carafate and instructed to follow-up as an outpatient Permanent A-fib not on anticoagulation complicated by severe orthostasis precluding rate control and not a good candidate for anticoagulation because of history of recent GI bleed. -Patient with multiple recent hospitalization last August and September due to GI bleed radiation proctitis and UTI. -Patient presents to ED with lower abdominal pain, workup significant for UTI/cystitis, with abdominal wall rectus sheath abscess, further workup also showing evidence of metastatic prostate disease, please see discussion below.     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 - Appears to be much improved on physical exam, no swelling, tenderness or erythema  . - Continue with as needed pain regimen . - MRI pelvis done 10/14, just was read, with concern of surrounding osteomyelitisPelvic rami and pubic area, will consult ID regarding recommendation for antibiotics treatment. - Concern for continuity of anterior lumen of urinary bladder with pubic symphysis and anterior abdominal wall abscess, urology consulted.  Surgical candidate - ED consulted regarding antibiotic recommendation,  will need prolonged IV antibiotics on discharge, will need total of 6 weeks, TOC discussing with patient this can be arranged at home, or will need to be arranged at facility.  Home health agent will be come talk to patient today, to see if diabetics can be done at home,. -PICC was inserted 10/19   Anemia, multifactorial due to malignancy and chronic blood loss anemia -Continue with supportive transfusion, received 1 unit infusion during hospital stay, so far hemoglobin remained stable - Hemoglobin remained stable  Metastatic prostate cancer XRT resection - Urology/Oncolgy/radiation oncology input greatly appreciated - Ok to resume Orgovyx and Xtandi  per urology, discussed with pharmacy to resume - Palliative medicine consult requested upon patient request  Radiation associated pubic symphysis ORN - Rad/onc input greatly appreciated, started on Trental and vitamin E - can consider bisphosphonate in 6 to 8 weeks  Atrial fibrillation CHADVASC >4 not on anticoagulation has bled score >4, recent hospitalization for associated hematoma and bleed due to radiation proctitis  CAD  chronic HFrEF -Continues midodrine  to 10 milligram 3 times daily  -No GDMT f at this time-is not volume overloaded-his statin from admission has been held, I have held his Lasix  from prior to admission additionally - He appears to be a euvolemic  Previous radiation proctitis with GI bleed -Poor  candidacy of anticoagulation or even aspirin  at this time -Would not resume aspirin  and  needs  follow-up in the outpatient to discuss this -Previous fall with multiple fractures  DVT prophylaxis: (Lovenox) Code Status: (Full) Family Communication: (none at bedside, discussed with wife by phone 10/18) Disposition:   Status is: Inpatient    Consultants:  Oncology General surgery Rad/ONC Cardiology Urology Infectious disease Palliative medicine   Subjective:  No significant events overnight, he denies any complaints Objective: Vitals:   07/22/24 0000 07/22/24 0400 07/22/24 0718 07/22/24 1106  BP: (!) 107/53 (!) 107/54 (!) 106/54   Pulse: 81 84    Resp: (!) 21 (!) 21 20   Temp: 98.3 F (36.8 C) 98.5 F (36.9 C) 98.2 F (36.8 C) 98.1 F (36.7 C)  TempSrc: Oral Oral Oral Oral  SpO2: 93% 94%    Weight:      Height:        Intake/Output Summary (Last 24 hours) at 07/22/2024 1134 Last data filed at 07/22/2024 1106 Gross per 24 hour  Intake 483 ml  Output 2550 ml  Net -2067 ml   Filed Weights   07/10/24 1755 07/11/24 2013  Weight: 64 kg 67 kg    Examination:  Awake Alert, Oriented X 3, No new F.N deficits, Normal affect Symmetrical Chest wall movement, Good air movement bilaterally, CTAB RRR,No Gallops,Rubs or new Murmurs, No Parasternal Heave +ve B.Sounds, Abd Soft, No tenderness, No rebound - guarding or rigidity. No Cyanosis, Clubbing or edema, No new Rash or bruise          Data Reviewed: I have personally reviewed following labs and imaging studies  CBC: Recent Labs  Lab 07/16/24 0258 07/17/24 0401 07/18/24 0325 07/19/24 0349 07/22/24 0700  WBC 9.7 9.0 7.8 7.8 7.4  NEUTROABS 7.8* 7.4 6.4 6.2  --   HGB 8.8* 8.8* 9.2* 8.7* 8.6*  HCT 28.0* 27.3* 29.2* 27.6* 27.8*  MCV 93.6 93.2 94.2 93.6 96.2  PLT 332 294 322 319 323    Basic Metabolic Panel: Recent Labs  Lab 07/16/24 0258 07/17/24 0401 07/18/24 0325 07/19/24 0349 07/22/24 0700  NA 135 132* 134* 134* 133*  K 3.6 3.7 3.9 4.2 3.9  CL 100 100 100 99 96*  CO2 25 24 25 26 28    GLUCOSE 107* 107* 103* 117* 127*  BUN 12 11 14 20  24*  CREATININE 0.71 0.67 0.65 0.69 0.62  CALCIUM  7.8* 7.8* 7.9* 8.2* 8.4*    GFR: Estimated Creatinine Clearance: 60.5 mL/min (by C-G formula based on SCr of 0.62 mg/dL).  Liver Function Tests: No results for input(s): AST, ALT, ALKPHOS, BILITOT, PROT, ALBUMIN in the last 168 hours.   CBG: No results for input(s): GLUCAP in the last 168 hours.   Recent Results (from the past 240 hours)  Aerobic/Anaerobic Culture w Gram Stain (surgical/deep wound)     Status: None   Collection Time: 07/12/24  3:09 PM   Specimen: Abscess  Result Value Ref Range Status   Specimen Description ABSCESS  Final   Special Requests LLQ  Final   Gram Stain   Final    ABUNDANT WBC PRESENT, PREDOMINANTLY PMN RARE GRAM POSITIVE COCCI IN CLUSTERS    Culture   Final    MODERATE PROTEUS MIRABILIS FEW AEROCOCCUS SPECIES Standardized susceptibility testing for this organism is not available. Performed at [facility], [address] [place], [place] [address]    Report Status 07/17/2024 FINAL  Final   Organism ID, Bacteria PROTEUS MIRABILIS  Final      Susceptibility   Proteus mirabilis - MIC*  AMPICILLIN <=2 SENSITIVE Sensitive     CEFAZOLIN (NON-URINE) 4 INTERMEDIATE Intermediate     CEFEPIME  <=0.12 SENSITIVE Sensitive     ERTAPENEM <=0.12 SENSITIVE Sensitive     CEFTRIAXONE  <=0.25 SENSITIVE Sensitive     CIPROFLOXACIN  <=0.06 SENSITIVE Sensitive     GENTAMICIN  <=1 SENSITIVE Sensitive     MEROPENEM <=0.25 SENSITIVE Sensitive     TRIMETH/SULFA <=20 SENSITIVE Sensitive     AMPICILLIN/SULBACTAM <=2 SENSITIVE Sensitive     PIP/TAZO Value in next row Sensitive      <=4 SENSITIVEThis is a modified FDA-approved test that has been validated and its performance characteristics determined by the reporting laboratory.  This laboratory is certified under the Clinical Laboratory Improvement Amendments CLIA as qualified to perform high  complexity clinical laboratory testing.    * MODERATE PROTEUS MIRABILIS         Radiology Studies: US  EKG SITE RITE Result Date: 07/20/2024 If Site Rite image not attached, placement could not be confirmed due to current cardiac rhythm.        Scheduled Meds:  Chlorhexidine  Gluconate Cloth  6 each Topical Daily   enoxaparin (LOVENOX) injection  40 mg Subcutaneous Q24H   enzalutamide   160 mg Oral Daily   feeding supplement  237 mL Oral TID BM   ferrous sulfate   325 mg Oral Q breakfast   midodrine   10 mg Oral TID WC   multivitamin with minerals  1 tablet Oral Daily   pantoprazole   40 mg Oral Daily   pentoxifylline  400 mg Oral TID WC   pravastatin   20 mg Oral QPM   relugolix  120 mg Oral Daily   sodium chloride  flush  3 mL Intravenous Q12H   sodium chloride  flush  3 mL Intravenous Q12H   Continuous Infusions:  cefTRIAXone  (ROCEPHIN )  IV 2 g (07/21/24 1245)   vitamin E capsule 1,000 Units       LOS: 11 days      Brayton Lye, MD [facility]   To contact the attending provider between 7A-7P or the covering provider during after hours 7P-7A, please log into the web site [email or web address] and access using universal [place] password for that web site. If you do not have the password, please call the hospital operator.  07/22/2024, 11:34 AM

## 2024-07-23 ENCOUNTER — Other Ambulatory Visit (HOSPITAL_COMMUNITY): Payer: Self-pay

## 2024-07-23 DIAGNOSIS — L02211 Cutaneous abscess of abdominal wall: Secondary | ICD-10-CM | POA: Diagnosis not present

## 2024-07-23 MED ORDER — VITAMIN E 180 MG (400 UNIT) PO CAPS
800.0000 [IU] | ORAL_CAPSULE | Freq: Every day | ORAL | 0 refills | Status: DC
  Filled 2024-07-23: qty 120, 60d supply, fill #0

## 2024-07-23 MED ORDER — RELUGOLIX 120 MG PO TABS: 120.0000 mg | ORAL_TABLET | Freq: Every day | ORAL | Status: DC

## 2024-07-23 MED ORDER — ENZALUTAMIDE 80 MG PO TABS: 160.0000 mg | ORAL_TABLET | Freq: Every day | ORAL | Status: AC

## 2024-07-23 MED ORDER — MIDODRINE HCL 10 MG PO TABS
10.0000 mg | ORAL_TABLET | Freq: Three times a day (TID) | ORAL | 0 refills | Status: AC
  Filled 2024-07-23: qty 90, 30d supply, fill #0

## 2024-07-23 MED ORDER — PENTOXIFYLLINE ER 400 MG PO TBCR
400.0000 mg | EXTENDED_RELEASE_TABLET | Freq: Three times a day (TID) | ORAL | 0 refills | Status: AC
  Filled 2024-07-23: qty 90, 30d supply, fill #0

## 2024-07-23 MED ORDER — ENSURE PLUS HIGH PROTEIN PO LIQD: 237.0000 mL | Freq: Three times a day (TID) | ORAL | Status: AC

## 2024-07-23 MED ORDER — ACETAMINOPHEN 325 MG PO TABS: 650.0000 mg | ORAL_TABLET | Freq: Four times a day (QID) | ORAL | Status: AC | PRN

## 2024-07-23 MED ORDER — PANTOPRAZOLE SODIUM 40 MG PO TBEC
40.0000 mg | DELAYED_RELEASE_TABLET | Freq: Every day | ORAL | 0 refills | Status: AC
  Filled 2024-07-23: qty 30, 30d supply, fill #0

## 2024-07-23 MED ORDER — CEFTRIAXONE IV (FOR PTA / DISCHARGE USE ONLY): 2.0000 g | INTRAVENOUS | 0 refills | Status: DC

## 2024-07-23 NOTE — Discharge Summary (Signed)
 Physician Discharge Summary  Ronald Kaiser FMW:[id number] DOB: 05-May-1934 DOA: 07/10/2024  PCP: Ronald Kaiser ORN, [name]  Admit date: 07/10/2024 Discharge date: 07/23/2024  Admitted From: (Home) Disposition:  (Home)  Recommendations for Outpatient Follow-up:  Follow up with PCP in 1-2 weeks Please obtain BMP/CBC in one week Patient will be discharged with a chronic Foley catheter, do not remove unless instructed so by his primary urologist.   Recommendation from ID service for outpatient IV antibiotics OPAT Orders Discharge antibiotics to be given via PICC line Discharge antibiotics: Ceftriaxone  2 g IV daily Per pharmacy protocol  Duration: 88 weeks  End Date: 11/21   Ronald Kaiser Care Per Protocol:   Home health RN for IV administration and teaching; PICC line care and labs.     Labs weekly while on IV antibiotics: X__ CBC with differential __ BMP X__ CMP X__ CRP X__ ESR __ Vancomycin  trough __ CK   X__ Please pull PIC at completion of IV antibiotics __ Please leave PIC in place until doctor has seen patient or been notified   Fax weekly labs to [phone]   Clinic Follow Up Appt: 11/20 at 10: 45 am     Diet recommendation: Regular diet  Brief/Interim Summary:  89 year old white male, HFrEF 25-30%, Metastatic lesions from prostatic cancer on Xtandi , he is managed currently by Dr. Nieves ,has had radiation proctitis with anemia previously and underwent colonoscopy 05/28/2024 showing severe radiation proctitis with APC prohibiting full colonoscopy-at the time was given Carafate and instructed to follow-up as an outpatient Permanent A-fib not on anticoagulation complicated by severe orthostasis precluding rate control and not a good candidate for anticoagulation because of history of recent GI bleed. -Patient with multiple recent hospitalization last August and September due to GI bleed radiation proctitis and UTI. -Patient presents to ED with lower abdominal pain, workup  significant for UTI/cystitis, with abdominal wall rectus sheath abscess, further workup also showing evidence of metastatic prostate disease, please see discussion below.      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  He will be discharged on prolonged IV antibiotic course per ID recommendation, please see discussion below. - much improved on physical exam, no swelling, tenderness or erythema . - MRI pelvis done 10/14, just was read, with concern of surrounding osteomyelitisPelvic rami and pubic area, will consult ID regarding recommendation for antibiotics treatment. - Concern for continuity of anterior lumen of urinary bladder with pubic symphysis and anterior abdominal wall abscess, urology consulted.  They recommended medical management, as he is not a surgical candidate - ID consulted regarding antibiotic recommendation,  will need prolonged IV antibiotics on discharge, will need total of 6 weeks, to be discharged on IV Rocephin  till 04/23/2087, and likely will need suppressive p.o. antibiotic lifelong after that . -PICC was inserted 10/19     Anemia, multifactorial due to malignancy and chronic blood loss anemia -Continue with supportive transfusion, received 1 unit infusion during hospital stay, so far hemoglobin remained stable - Hemoglobin remained stable   Metastatic prostate cancer XRT resection - Urology/Oncolgy/radiation oncology input greatly appreciated - Ok to resume Orgovyx and Xtandi  per urology, discussed with pharmacy to resume - Palliative medicine consult requested upon patient request   Radiation associated pubic symphysis ORN - Rad/onc input greatly appreciated, started on  Trental  and vitamin E - can consider bisphosphonate in 6 to 8 weeks   Atrial fibrillation CHADVASC >4 not on anticoagulation has bled score >4, recent hospitalization for associated hematoma and bleed due to radiation proctitis   CAD  chronic HFrEF Orthostasis/hypotension -Continues midodrine  to 10 milligram 3 times daily  - Low blood pressure, cannot tolerate any GDMT, euvolemic, no need for diuresis. - He is on midodrine , have increased the dose, instructed to continue with TED hose thigh-high all the time with activity   Previous radiation proctitis with GI bleed -Poor  candidacy of anticoagulation or even aspirin  at this time -Would not resume aspirin , especially now he is started on Trental and needs follow-up in the outpatient to discuss this -Previous fall with multiple fractures     Discharge Diagnoses:  Principal Problem:   Abdominal wall abscess Active Problems:   Sepsis (HCC)   Essential hypertension   Prostate cancer metastatic to bone (HCC)   Paroxysmal atrial fibrillation (HCC)   History of CAD (coronary artery disease)   HFrEF (heart failure with reduced ejection fraction) (HCC)   Elevated troponin   Protein-calorie malnutrition, severe    Discharge Instructions  Discharge Instructions     [facility] pharmacist to adjust dose for Vancomycin , Aminoglycosides and other anti-infective therapies as requested by physician.   Complete by: As directed    [facility] to provide Cath Flo 2mg    Complete by: As directed    Administer for PICC line occlusion and as ordered by physician for other access device issues.   Anaphylaxis Kit: Provided to treat any anaphylactic reaction to the medication being provided to the patient if First Dose or when requested by physician   Complete by: As directed    Epinephrine 1mg /ml vial / amp: Administer 0.3mg  (0.18ml) subcutaneously once for moderate to severe anaphylaxis, nurse to call physician and pharmacy  when reaction occurs and call 911 if needed for immediate care   Diphenhydramine 50mg /ml IV vial: Administer 25-50mg  IV/IM PRN for first dose reaction, rash, itching, mild reaction, nurse to call physician and pharmacy when reaction occurs   Sodium Chloride  0.9% NS 500ml IV: Administer if needed for hypovolemic blood pressure drop or as ordered by physician after call to physician with anaphylactic reaction   Change dressing on IV access line weekly and PRN   Complete by: As directed    Diet - low sodium heart healthy   Complete by: As directed    Discharge instructions   Complete by: As directed    Follow with Primary MD Ronald Kaiser ORN, [name] in 7 days   Get CBC, CMP,  checked  by Primary MD next visit.    Activity: As tolerated with Full fall precautions use walker/cane & assistance as needed   Disposition Home    Diet: Regular diet   On your next visit with your primary care physician please Get Medicines reviewed and adjusted.   Please request your Prim.MD to go over all Hospital Tests and Procedure/Radiological results at the follow up, please get all Hospital records sent to your Prim MD by signing hospital release before you go home.   If you experience worsening of your admission symptoms, develop shortness of breath, life threatening emergency, suicidal or homicidal thoughts you must seek medical attention immediately by calling 911 or calling your MD immediately  if symptoms less severe.  You Must read complete instructions/literature along with all the possible adverse reactions/side effects for all the Medicines you take and  that have been prescribed to you. Take any new Medicines after you have completely understood and accpet all the possible adverse reactions/side effects.   Do not drive, operating heavy machinery, perform activities at heights, swimming or participation in water  activities or provide baby sitting services if your were admitted for syncope or siezures  until you have seen by Primary MD or a Neurologist and advised to do so again.  Do not drive when taking Pain medications.    Do not take more than prescribed Pain, Sleep and Anxiety Medications  Special Instructions: If you have smoked or chewed Tobacco  in the last 2 yrs please stop smoking, stop any regular Alcohol  and or any Recreational drug use.  Wear Seat belts while driving.   Please note  You were cared for by a hospitalist during your hospital stay. If you have any questions about your discharge medications or the care you received while you were in the hospital after you are discharged, you can call the unit and asked to speak with the hospitalist on call if the hospitalist that took care of you is not available. Once you are discharged, your primary care physician will handle any further medical issues. Please note that NO REFILLS for any discharge medications will be authorized once you are discharged, as it is imperative that you return to your primary care physician (or establish a relationship with a primary care physician if you do not have one) for your aftercare needs so that they can reassess your need for medications and monitor your lab values.   Flush IV access with Sodium Chloride  0.9% and Heparin  10 units/ml or 100 units/ml   Complete by: As directed    Home infusion instructions - [facility]   Complete by: As directed    Instructions: Flush IV access with Sodium Chloride  0.9% and Heparin  10units/ml or 100units/ml   Change dressing on IV access line: Weekly and PRN   Instructions Cath Flo 2mg : Administer for PICC Line occlusion and as ordered by physician for other access device   [facility] pharmacist to adjust dose for: Vancomycin , Aminoglycosides and other anti-infective therapies as requested by physician   Increase activity slowly   Complete by: As directed    Method of administration may be changed at the discretion of home infusion  pharmacist based upon assessment of the patient and/or caregiver's ability to self-administer the medication ordered   Complete by: As directed    No wound care   Complete by: As directed       Allergies as of 07/23/2024       Reactions   Bactrim [sulfamethoxazole-trimethoprim] Hives, Shortness Of Breath, Swelling   Augmentin [amoxicillin-pot Clavulanate] Diarrhea   Crestor  [rosuvastatin  Calcium ] Other (See Comments)   Back pain Grogginess  Dizziness    Zetia  [ezetimibe ] Other (See Comments)   Back pain Grogginess  Dizziness         Medication List     STOP taking these medications    aspirin  EC 81 MG tablet   furosemide  20 MG tablet Commonly known as: LASIX        TAKE these medications    acetaminophen  325 MG tablet Commonly known as: TYLENOL  Take 2 tablets (650 mg total) by mouth every 6 (six) hours as needed for mild pain (pain score 1-3) or fever (or Fever >/= 101).   CALCIUM  + VITAMIN D3 PO Take 1 tablet by mouth daily.   cefTRIAXone  IVPB Commonly known as: ROCEPHIN  Inject  2 g into the vein daily. Indication:  Intra-abdominal abscess First Dose: Yes Last Day of Therapy:  08/23/24 Labs - Once weekly:  CBC/D and BMP, Labs - Once weekly: ESR and CRP Method of administration: IV Push or per SNF protocol (i.e IVPB) Method of administration may be changed at the discretion of home infusion pharmacist based upon assessment of the patient and/or caregiver's ability to self-administer the medication ordered.   Centrum Silver Men 50+ Tabs Take 1 tablet by mouth daily.   enzalutamide  80 MG tablet Commonly known as: Xtandi  Take 2 tablets (160 mg total) by mouth daily. Resume home meds   feeding supplement Liqd Take 237 mLs by mouth 3 (three) times daily between meals.   ferrous sulfate  325 (65 FE) MG EC tablet Take 325 mg by mouth daily with breakfast.   midodrine  10 MG tablet Commonly known as: PROAMATINE  Take 1 tablet (10 mg total) by mouth 3 (three)  times daily with meals. What changed:  medication strength how much to take when to take this   nitroGLYCERIN  0.4 MG SL tablet Commonly known as: NITROSTAT  Place 1 tablet (0.4 mg total) under the tongue every 5 (five) minutes as needed.   pantoprazole  40 MG tablet Commonly known as: PROTONIX  Take 1 tablet (40 mg total) by mouth daily.   pentoxifylline 400 MG CR tablet Commonly known as: TRENTAL Take 1 tablet (400 mg total) by mouth 3 (three) times daily with meals.   pravastatin  20 MG tablet Commonly known as: PRAVACHOL  Take 1 tablet (20 mg total) by mouth every evening.   relugolix 120 MG tablet Commonly known as: ORGOVYX Take 1 tablet (120 mg total) by mouth daily. Resume home meds   vitamin E 180 MG (400 UNITS) capsule Take 2 capsules (800 Units total) by mouth daily.               Durable Medical Equipment  (From admission, onward)           Start     Ordered   07/23/24 0948  For home use only DME 4 wheeled rolling walker with seat  Once       Question:  Patient needs a walker to treat with the following condition  Answer:  Weakness   07/23/24 0947              Discharge Care Instructions  (From admission, onward)           Start     Ordered   07/23/24 0000  Change dressing on IV access line weekly and PRN  (Home infusion instructions - [facility] )        07/23/24 0918            Follow-up Information     [facility] Follow up.   Why: to resume [facility] services Contact information: [address] [place] [address] [phone]                Allergies  Allergen Reactions   Bactrim [Sulfamethoxazole-Trimethoprim] Hives, Shortness Of Breath and Swelling   Augmentin [Amoxicillin-Pot Clavulanate] Diarrhea   Crestor  [Rosuvastatin  Calcium ] Other (See Comments)    Back pain Grogginess  Dizziness    Zetia  [Ezetimibe ] Other (See Comments)    Back pain Grogginess  Dizziness      Consultations: Oncology General surgery Rad/ONC Cardiology Urology Infectious disease Palliative medicin    Procedures/Studies: US  EKG SITE RITE Result Date: 07/20/2024 If Site Rite image not  attached, placement could not be confirmed due to current cardiac rhythm.  CT CYSTOGRAM ABD/PELVIS Result Date: 07/17/2024 CLINICAL DATA:  Pelvic abscess with possible communication between the bladder and the abscess. EXAM: CT CYSTOGRAM (CT ABDOMEN AND PELVIS WITH CONTRAST) TECHNIQUE: Multi-detector CT imaging through the abdomen and pelvis was performed after dilute contrast had been introduced into the bladder for the purposes of performing CT cystography. RADIATION DOSE REDUCTION: This exam was performed according to the departmental dose-optimization program which includes automated exposure control, adjustment of the mA and/or kV according to patient size and/or use of iterative reconstruction technique. CONTRAST:  50mL OMNIPAQUE  IOHEXOL  300 MG/ML  SOLN COMPARISON:  MRI 07/16/2024. FINDINGS: Lower chest: Bronchiectasis with architectural distortion and atelectasis/scarring noted in both lung bases, right greater. Hepatobiliary: No suspicious focal abnormality in the liver on this study without intravenous contrast. There is no evidence for gallstones, gallbladder wall thickening, or pericholecystic fluid. No intrahepatic or extrahepatic biliary dilation. Pancreas: No focal mass lesion. No dilatation of the main duct. No intraparenchymal cyst. No peripancreatic edema. Spleen: No splenomegaly. No suspicious focal mass lesion. Adrenals/Urinary Tract: No adrenal nodule or mass. Mild fullness noted right ureter without overt right hydroureteronephrosis. There is mild left hydroureteronephrosis. Contrast material in the bladder has been introduced in a retrograde fashion. Bladder is poorly distended but there is evidence for extraperitoneal extravasation of contrast into the pelvic floor with contrast  material tracking anteriorly in the pelvis through the region of the symphysis pubis into a thick walled collection of fluid, gas, and extravasated contrast in the anterior pelvic wall corresponding to the described abscess on CT of 07/10/2024. the fistula between the bladder seems to tract just caudal to the symphysis pubis although it may also tract directly through the symphysis pubis itself. Stomach/Bowel: Stomach is unremarkable. No gastric wall thickening. No evidence of outlet obstruction. Duodenum is normally positioned as is the ligament of Treitz. No small bowel wall thickening. No small bowel dilatation. The terminal ileum is normal. The appendix is normal. No gross colonic mass. No colonic wall thickening. Moderate stool volume evident. Vascular/Lymphatic: There is moderate atherosclerotic calcification of the abdominal aorta without aneurysm. There is no gastrohepatic or hepatoduodenal ligament lymphadenopathy. No retroperitoneal or mesenteric lymphadenopathy. No pelvic sidewall lymphadenopathy. Reproductive: Prostate gland not well seen. Other: No substantial intraperitoneal free fluid. Musculoskeletal: Diffuse body wall edema evident. There is erosion bony anatomy in the pubic bones bilaterally, left greater than right, raising concern for metastatic disease and/or osteomyelitis. Compression deformity noted at T11 and T12. IMPRESSION: 1. Extraperitoneal extravasation of contrast at the bladder base into the pelvic floor with contrast material tracking anteriorly in the pelvis through the region of the symphysis pubis into a thick walled collection of fluid, gas, and extravasated contrast in the anterior pelvic wall corresponding to the described abscess on CT of 07/10/2024. 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 2. Erosion of the bony anatomy in the pubic bones bilaterally, left greater than right, raising concern for  metastatic disease and/or osteomyelitis. This was better characterized on recent MRI of the pelvis. 3. Mild left hydroureteronephrosis. 4. Bronchiectasis with architectural distortion and atelectasis/scarring in both lung bases, right greater than left. 5. Diffuse body wall edema. 6. Compression deformity at T11 and T12. 7.  Aortic Atherosclerosis (ICD10-I70.0). Electronically Signed   By: Camellia Candle M.D.   On: 07/17/2024 12:50   MR PELVIS W WO CONTRAST Result Date: 07/17/2024 CLINICAL DATA:  Prior radiation therapy to the pelvis for prostate cancer. Cortical erosions of the pubis. History pelvic/prostate radiotherapy in 2003, salvage SBRT to the left seminal vesicle and T12 in 2022, and more recently SP RT to a left pubic ramus metastasis in 2023. EXAM: MRI PELVIS WITHOUT AND WITH CONTRAST TECHNIQUE: Multiplanar multisequence MR imaging of the pelvis was performed both before and after administration of intravenous contrast. CONTRAST:  6.5mL GADAVIST GADOBUTROL 1 MMOL/ML IV SOLN COMPARISON:  CT scan 07/10/2024 FINDINGS: Urinary Tract: Mild distal hydroureter. Irregular and thick-walled urinary bladder with enhancing margins compatible with cystitis, with a Foley catheter in place. There is some mixed signal intensity material inferiorly in the urinary bladder below the Foley catheter balloon, and suspected continuity of the anterior wall of the urinary bladder with the pubic symphysis on image 36 series 11. The fluid in the pubic symphysis itself is questionably continuous with an apparent abscess of the rectus abdominus musculature (left greater than right) and linea alba measuring about 7.1 by 2.4 by 8.3 cm (volume = 74 cm^3), with internal gas and fluid density and prominently enhancing margins. If clinically warranted, cystogram conducted through the Foley catheter could be utilized to assess for confirm continuity of the urinary bladder with the pubic symphysis and anterior abdominal wall abscess. Bowel:  Moderate lower rectal wall thickening, radiation proctitis not excluded. Vascular/Lymphatic: Unremarkable Reproductive:  Recognizable prostate gland not identified. Other: Low-level edema tracks along the iliacus and gluteal musculature. More striking edema along the hip adductor musculature and pelvic sidewall musculature could reflect myositis. Musculoskeletal: Bony erosions in the pubic bodies along the pubic symphysis with associated marrow edema in the pubic bodies and adjacent pubic rami favoring osteomyelitis. Enhancing lesion favoring a metastatic deposit in the left ischial tuberosity measuring 2.8 cm in diameter on image 40 series 7. Bandlike edema with central low signal T2 intensity in the right iliac bone adjacent to the SI joint measuring 4.4 by 1.5 cm on image 19 series 6, metastatic lesion not excluded. 1.5 cm in diameter lesion with high T2 and low T1 signal in the left femoral head and neck on image 24 series 3, metastatic lesion not excluded. Lower lumbar spondylosis and degenerative disc disease. IMPRESSION: 1. Bony erosions in the pubic bodies along the pubic symphysis with associated marrow edema in the pubic bodies and adjacent pubic rami favoring osteomyelitis. 2. Suspected continuity of the anterior lumen of the urinary bladder with the pubic symphysis and anterior abdominal wall abscess. if clinically warranted, cystogram through the Foley catheter could be utilized to assess for continuity of the urinary bladder with the pubic symphysis and anterior abdominal wall abscess. 3. Enhancing lesion favoring a metastatic deposit in the left ischial tuberosity. 4. Bandlike edema with central low signal T2 intensity in the right iliac bone adjacent to the SI joint, metastatic lesion not excluded. 5. 1.5 cm in diameter lesion with high T2 and low T1 signal in the left femoral head and neck, metastatic lesion not excluded. 6. Moderate lower rectal wall thickening, radiation proctitis not excluded. 7.  Mild distal hydroureter. 8. Low-level edema tracks along the iliacus and gluteal musculature. More striking edema along the hip adductor musculature and pelvic sidewall musculature could reflect myositis. Electronically Signed   By: Ryan Salvage M.D.   On: 07/17/2024 11:43   US  Abscess Drain Result Date: 07/12/2024 INDICATION: Severe lower abdominal pain. CT demonstrates Low anterior rectus abdominus collection with surrounding stranding likely representing abscess. EXAM: U/S ASPIRATION OF ANTERIOR BODY WALL COLLECTION MEDICATIONS:  No periprocedural antibiotics were indicated ANESTHESIA/SEDATION: 1% lidocaine  subcutaneous COMPLICATIONS: None immediate. PROCEDURE: Informed written consent was obtained from the patient after a thorough discussion of the procedural risks, benefits and alternatives. All questions were addressed. Maximal Sterile Barrier Technique was utilized including caps, mask, sterile gowns, sterile gloves, sterile drape, hand hygiene and skin antiseptic. A timeout was performed prior to the initiation of the procedure. Survey ultrasound of the left pelvic anterior body wall revealed a complex fluid collection corresponding to recent CT findings. An appropriate lateral skin entry site was determined and marked, then prepped with chlorhexidine , draped in usual sterile fashion, infiltrated locally with 1% lidocaine . Under real-time ultrasound guidance, a 10 cm multi sidehole 5 [place] Yueh sheath needle advanced into the collection. Approximately 30 mL of cloudy serosanguineous fluid were aspirated using 2 passes. These were sent for culture and sensitivity. Postprocedure scans show significant decompression of the collection, with small residual fluid. The patient tolerated the procedure well. IMPRESSION: 1. Technically successful ultrasound-guided aspiration of left anterior pelvic body wall collection, removing 30 mL cloudy fluid, sent for Gram stain and culture. Electronically Signed   By:  JONETTA Faes M.D.   On: 07/12/2024 16:07   ECHOCARDIOGRAM LIMITED Result Date: 07/11/2024    ECHOCARDIOGRAM LIMITED REPORT   Patient Name:   Ronald Kaiser Date of Exam: 07/11/2024 Medical Rec #:  [id number]      Height:       71.0 in Accession #:    [id number]     Weight:       141.0 lb Date of Birth:  Jul 11, 1934     BSA:          1.817 m Patient Age:    89 years       BP:           96/51 mmHg Patient Gender: M              HR:           81 bpm. Exam Location:  Inpatient Procedure: 2D Echo, Limited Color Doppler and Intracardiac Opacification Agent Indications:    Congestive heart failure  History:        Patient has prior history of Echocardiogram examinations. CHF,                 CAD; Risk Factors:Hypertension.  Sonographer:    Charmaine Gaskins Referring Phys: [id number] SUBRINA SUNDIL IMPRESSIONS  1. Left ventricular ejection fraction, by estimation, is 25 to 30%. The left ventricle has severely decreased function. The left ventricle demonstrates regional wall motion abnormalities with mid to apical anteroseptal and inferoseptal akinesis, apical anterior akinesis, apical lateral akinesis, apical inferior akinesis and akinesis of the true apex. LAD territory wall motion abnormalities. No LV thrombus visualized.  2. The mitral valve is normal in structure. Mild mitral valve regurgitation. No evidence of mitral stenosis.  3. The aortic valve is tricuspid. There is moderate calcification of the aortic valve. Aortic valve regurgitation is mild. No aortic stenosis is present.  4. Aortic dilatation noted. There is mild dilatation of the aortic root, measuring 40 mm.  5. Limited echo for LV function. FINDINGS  Left Ventricle: Left ventricular ejection fraction, by estimation, is 25 to 30%. The left ventricle has severely decreased function. The left ventricle demonstrates regional wall motion abnormalities. Definity  contrast agent was given IV to delineate the left ventricular endocardial borders. Left Atrium: Left atrial  size was normal in size. Right Atrium: Right atrial size was normal in size.  Mitral Valve: The mitral valve is normal in structure. Mild mitral valve regurgitation. No evidence of mitral valve stenosis. Tricuspid Valve: Tricuspid valve regurgitation is mild. Aortic Valve: The aortic valve is tricuspid. There is moderate calcification of the aortic valve. Aortic valve regurgitation is mild. No aortic stenosis is present. Aorta: The aortic root is normal in size and structure and aortic dilatation noted. There is mild dilatation of the aortic root, measuring 40 mm.  LV Volumes (MOD) LV vol d, MOD A2C: 142.0 ml LV vol d, MOD A4C: 182.0 ml LV vol s, MOD A2C: 108.0 ml LV vol s, MOD A4C: 112.0 ml LV SV MOD A2C:     34.0 ml LV SV MOD A4C:     182.0 ml LV SV MOD BP:      57.0 ml RIGHT VENTRICLE RV S prime:     21.10 cm/s  AORTA Ao Root diam: 4.00 cm Ao Asc diam:  3.50 cm Dalton McleanMD Electronically signed by Ezra Kanner Signature Date/Time: 07/11/2024/1:35:24 PM    Final    CT Angio Chest/Abd/Pel for Dissection W and/or Wo Contrast Result Date: 07/10/2024 CLINICAL DATA:  Acute aortic syndrome suspected. Shortness of breath. EXAM: CT ANGIOGRAPHY CHEST, ABDOMEN AND PELVIS TECHNIQUE: Non-contrast CT of the chest was initially obtained. Multidetector CT imaging through the chest, abdomen and pelvis was performed using the standard protocol during bolus administration of intravenous contrast. Multiplanar reconstructed images and MIPs were obtained and reviewed to evaluate the vascular anatomy. RADIATION DOSE REDUCTION: This exam was performed according to the departmental dose-optimization program which includes automated exposure control, adjustment of the mA and/or kV according to patient size and/or use of iterative reconstruction technique. CONTRAST:  75mL OMNIPAQUE  IOHEXOL  350 MG/ML SOLN COMPARISON:  Chest radiograph 07/10/2024. CT abdomen and pelvis 05/26/2024. CT chest 03/04/2022. PET CT 12/13/2022 FINDINGS: CTA  CHEST FINDINGS Cardiovascular: Unenhanced images of the chest demonstrate diffuse calcification throughout the aorta and coronary arteries. No acute intramural hematoma. Images obtained during arterial phase after intravenous contrast material administration demonstrate patent common normal caliber thoracic aorta. No aortic dissection. Great vessel origins are patent. No filling defects in the central pulmonary arteries. No evidence of significant pulmonary embolus. Normal heart size although the left ventricle appears mildly dilated. No pericardial effusion. Mediastinum/Nodes: Esophagus is decompressed. No significant lymphadenopathy. Thyroid gland is unremarkable. Lungs/Pleura: Emphysematous changes in the lungs. Bilateral basilar atelectasis. Peribronchial thickening with mild bronchiectasis consistent with chronic bronchitis. No pleural effusion or pneumothorax. Musculoskeletal: Degenerative changes in the spine. Compression of T11 and T12 vertebra, unchanged. Sclerotic lesions demonstrated in several thoracic vertebrae and ribs corresponding to known metastatic disease. Review of the MIP images confirms the above findings. CTA ABDOMEN AND PELVIS FINDINGS VASCULAR Aorta: Normal caliber aorta without aneurysm, dissection, vasculitis or significant stenosis. Diffuse aortic calcification. Celiac: Patent without evidence of aneurysm, dissection, vasculitis or significant stenosis. SMA: Patent without evidence of aneurysm, dissection, vasculitis or significant stenosis. Renals: Both renal arteries are patent without evidence of aneurysm, dissection, vasculitis, fibromuscular dysplasia or significant stenosis. IMA: Patent without evidence of aneurysm, dissection, vasculitis or significant stenosis. Inflow: Patent without evidence of aneurysm, dissection, vasculitis or significant stenosis. Veins: No obvious venous abnormality within the limitations of this arterial phase study. Review of the MIP images confirms the  above findings. NON-VASCULAR Hepatobiliary: No focal liver abnormality is seen. No gallstones, gallbladder wall thickening, or biliary dilatation. Pancreas: Unremarkable. No pancreatic ductal dilatation or surrounding inflammatory changes. Spleen: Normal in size without focal abnormality. Adrenals/Urinary Tract: No adrenal gland  nodules. Renal nephrograms are symmetrical but there is bilateral hydronephrosis and hydroureter down to the level of the bladder. No ureteral stones are identified. Changes likely indicate reflux disease or bladder outlet obstruction. Diffuse bladder wall thickening likely to represent radiation change given history of prostate cancer. Bladder outlet obstruction could also have this appearance. Coarse calcifications in the dependent portion of the bladder or possibly in the bladder wall or prostate. This is unchanged since prior study. Stomach/Bowel: Stomach, small bowel, and colon are not abnormally distended. Stool throughout the colon. No wall thickening or inflammatory changes. Appendix is not identified. Lymphatic: No significant lymphadenopathy. Reproductive: Prostate gland is not well visualized and may be surgically absent or atrophic due to radiation therapy. In the prostate bed, there is increased soft tissue which extends into the anterolateral left pelvic sidewall and the left proximal hip adductor muscles. This likely represents residual tumor or post treatment changes. Pelvic soft tissue infiltration is increasing since the previous studies. Loss of fat plane between the soft tissues and the bladder. Pelvic sidewall soft tissue today measures 3.2 cm thickness. Other: No free air or free fluid in the abdomen. There is a large heterogeneous collection in the inferior left and central rectus abdominus muscles extending down to the symphysis pubis. This collection measures up to about 5.2 cm diameter. Stranding in the surrounding subcutaneous fat. This is likely an abscess.  Musculoskeletal: Degenerative changes in the spine and hips. Scattered focal areas of bone sclerosis consistent with known metastasis. Cortical erosion at the symphysis pubis may represent post treatment changes or osteomyelitis. Review of the MIP images confirms the above findings. IMPRESSION: 1. Aortic atherosclerosis. No evidence of aneurysm or dissection involving the thoracic or abdominal aorta. 2. Overall normal heart size with left ventricular dilatation. 3. Atelectasis in the lung bases. 4. Increasing soft tissue mass/infiltration in the low pelvis involving the prostate bed and extending to the left pelvic sidewall and into the proximal left adductor muscles. This likely represents residual recurrent tumor versus post treatment changes or possibly inflammatory process. 5. Diffuse bladder wall thickening may be due to bladder outlet obstruction or treatment changes. Stable calcification in the bladder or prostate region. Bilateral hydronephrosis and hydroureter likely related to reflux or bladder outlet obstruction. 6. Low anterior rectus abdominus collection with surrounding stranding likely representing abscess. 7. Cortical erosion at the symphysis pubis may represent osteomyelitis or possibly radiation changes. 8. Multiple sclerotic bone lesions consistent with known metastatic disease. Electronically Signed   By: Elsie Gravely M.D.   On: 07/10/2024 23:03   DG Chest Port 1 View Result Date: 07/10/2024 CLINICAL DATA:  Question of sepsis to evaluate for abnormality. Diffuse abdominal pain for 2 weeks radiating down the legs. EXAM: PORTABLE CHEST 1 VIEW COMPARISON:  06/06/2024 FINDINGS: Shallow inspiration. Heart size and pulmonary vascularity are normal for technique. Peribronchial thickening with bronchiectasis in streaky perihilar opacities likely representing bronchitic changes. This could represent acute or chronic bronchitis. Scarring in the lung apices and bases. Probable linear atelectasis also  in the lung bases. No pleural effusion or pneumothorax. Mediastinal contours appear intact. Calcification of the aorta. Poorly defined sclerotic lesions demonstrated in the scapula and ribs correlating with known metastatic disease. IMPRESSION: 1. Shallow inspiration. 2. Emphysematous changes, fibrosis, and bronchitic changes in the lungs. 3. Mild linear atelectasis in the lung bases. No focal consolidation. 4. Sclerotic bone lesions corresponding to known metastatic disease. Electronically Signed   By: Elsie Gravely M.D.   On: 07/10/2024 19:37  Subjective:  Denies any complaints today, excited about going home Discharge Exam: Vitals:   07/23/24 0800 07/23/24 1101  BP: 94/80 111/64  Pulse: 84   Resp: 16   Temp:  98.5 F (36.9 C)  SpO2: 97%    Vitals:   07/23/24 0600 07/23/24 0700 07/23/24 0800 07/23/24 1101  BP:  106/62 94/80 111/64  Pulse:   84   Resp: 18 20 16    Temp:  98.2 F (36.8 C)  98.5 F (36.9 C)  TempSrc:  Oral  Oral  SpO2: 93%  97%   Weight:      Height:        General: Pt is alert, awake, not in acute distress, frail Cardiovascular: RRR, S1/S2 +, no rubs, no gallops Respiratory: CTA bilaterally, no wheezing, no rhonchi Abdominal: Soft, NT, ND, bowel sounds + Extremities: no edema, no cyanosis    The results of significant diagnostics from this hospitalization (including imaging, microbiology, ancillary and laboratory) are listed below for reference.     Microbiology: No results found for this or any previous visit (from the past 240 hours).   Labs: BNP (last 3 results) Recent Labs    05/26/24 1218 07/10/24 1846  BNP 345.7* 857.5*   Basic Metabolic Panel: Recent Labs  Lab 07/17/24 0401 07/18/24 0325 07/19/24 0349 07/22/24 0700  NA 132* 134* 134* 133*  K 3.7 3.9 4.2 3.9  CL 100 100 99 96*  CO2 24 25 26 28   GLUCOSE 107* 103* 117* 127*  BUN 11 14 20  24*  CREATININE 0.67 0.65 0.69 0.62  CALCIUM  7.8* 7.9* 8.2* 8.4*   Liver Function  Tests: No results for input(s): AST, ALT, ALKPHOS, BILITOT, PROT, ALBUMIN in the last 168 hours. No results for input(s): LIPASE, AMYLASE in the last 168 hours. No results for input(s): AMMONIA in the last 168 hours. CBC: Recent Labs  Lab 07/17/24 0401 07/18/24 0325 07/19/24 0349 07/22/24 0700  WBC 9.0 7.8 7.8 7.4  NEUTROABS 7.4 6.4 6.2  --   HGB 8.8* 9.2* 8.7* 8.6*  HCT 27.3* 29.2* 27.6* 27.8*  MCV 93.2 94.2 93.6 96.2  PLT 294 322 319 323   Cardiac Enzymes: No results for input(s): CKTOTAL, CKMB, CKMBINDEX, TROPONINI in the last 168 hours. BNP: Invalid input(s): POCBNP CBG: No results for input(s): GLUCAP in the last 168 hours. D-Dimer No results for input(s): DDIMER in the last 72 hours. Hgb A1c No results for input(s): HGBA1C in the last 72 hours. Lipid Profile No results for input(s): CHOL, HDL, LDLCALC, TRIG, CHOLHDL, LDLDIRECT in the last 72 hours. Thyroid function studies No results for input(s): TSH, T4TOTAL, T3FREE, THYROIDAB in the last 72 hours.  Invalid input(s): FREET3 Anemia work up No results for input(s): VITAMINB12, FOLATE, FERRITIN, TIBC, IRON , RETICCTPCT in the last 72 hours. Urinalysis    Component Value Date/Time   COLORURINE YELLOW 07/10/2024 2359   APPEARANCEUR TURBID (A) 07/10/2024 2359   LABSPEC 1.024 07/10/2024 2359   PHURINE 7.0 07/10/2024 2359   GLUCOSEU NEGATIVE 07/10/2024 2359   HGBUR SMALL (A) 07/10/2024 2359   BILIRUBINUR NEGATIVE 07/10/2024 2359   KETONESUR NEGATIVE 07/10/2024 2359   PROTEINUR 100 (A) 07/10/2024 2359   NITRITE NEGATIVE 07/10/2024 2359   LEUKOCYTESUR SMALL (A) 07/10/2024 2359   Sepsis Labs Recent Labs  Lab 07/17/24 0401 07/18/24 0325 07/19/24 0349 07/22/24 0700  WBC 9.0 7.8 7.8 7.4   Microbiology No results found for this or any previous visit (from the past 240 hours).   Time coordinating discharge: Over  30 minutes  SIGNED:   Brayton Lye, MD  [facility] 07/23/2024, 11:23 AM Pager   If 7PM-7AM, please contact night-coverage [email or web address]

## 2024-07-23 NOTE — Progress Notes (Addendum)
     Subjective: NAEON. Pt looks well this am. Reviewed plans for long term foley, exchange, and follow up.   Objective: Vital signs in last 24 hours: Temp:  [97.8 F (36.6 C)-98.3 F (36.8 C)] 98 F (36.7 C) (10/21 0000) Pulse Rate:  [77-86] 77 (10/21 0000) Resp:  [18-20] 18 (10/21 0600) BP: (104-106)/(49-85) 106/58 (10/21 0000) SpO2:  [91 %-95 %] 93 % (10/21 0600)  Assessment/Plan: # Metastatic prostate cancer # Dystrophic prostatic calcifications # Diffuse bladder wall thickening # Bladder/pubic symphysis/abdominal fluid collection fistula  Discussed management with long term foley and 30d exchanges. He reports that he needed a catheter for a while previously and is already familiar with the process from our practice.   [facility] [address] second floor [place], [place]  [phone]   Resume Orgovyx and Xtandi .  Follow-up with Dr. Nieves as scheduled.  Labs and VSS. Okay to discharge once medically clear. Urology will sign off at this time.   Case and plan discussed with Dr. Cam and Dr. Nieves   Intake/Output from previous day: 10/20 0701 - 10/21 0700 In: 3 [I.V.:3] Out: 250 [Urine:250]  Intake/Output this shift: No intake/output data recorded.  Physical Exam:  General: Alert and oriented CV: No cyanosis Lungs: equal chest rise Abdomen: Soft, NTND, no rebound or guarding Gu: Foley catheter in place draining clear yellow urine  Lab Results: Recent Labs    07/22/24 0700  HGB 8.6*  HCT 27.8*   BMET Recent Labs    07/22/24 0700  NA 133*  K 3.9  CL 96*  CO2 28  GLUCOSE 127*  BUN 24*  CREATININE 0.62  CALCIUM  8.4*  HGB 8.6*  WBC 7.4     Studies/Results: No results found.     LOS: 12 days   Ole Bourdon, NP [facility] Pager: [phone]  07/23/2024, 7:33 AM

## 2024-07-23 NOTE — Plan of Care (Signed)
 Pt has rested quietly throughout the night with no distress noted. Alert and oriented. On room air. SR on the monitor. Foley cath intact to BSD. No complaints voiced.     Problem: Education: Goal: Knowledge of General Education information will improve Description: Including pain rating scale, medication(s)/side effects and non-pharmacologic comfort measures 07/23/2024 0637 by Boston Morrison, RN Outcome: Progressing 07/23/2024 0634 by Boston Morrison, RN Outcome: Progressing   Problem: Health Behavior/Discharge Planning: Goal: Ability to manage health-related needs will improve 07/23/2024 0637 by Boston Morrison, RN Outcome: Progressing 07/23/2024 0634 by Boston Morrison, RN Outcome: Progressing   Problem: Clinical Measurements: Goal: Ability to maintain clinical measurements within normal limits will improve Outcome: Progressing Goal: Respiratory complications will improve 07/23/2024 0637 by Boston Morrison, RN Outcome: Progressing 07/23/2024 0634 by Boston Morrison, RN Outcome: Progressing Goal: Cardiovascular complication will be avoided 07/23/2024 9362 by Boston Morrison, RN Outcome: Progressing 07/23/2024 0634 by Boston Morrison, RN Outcome: Progressing   Problem: Nutrition: Goal: Adequate nutrition will be maintained 07/23/2024 9362 by Boston Morrison, RN Outcome: Progressing 07/23/2024 0634 by Boston Morrison, RN Outcome: Progressing   Problem: Coping: Goal: Level of anxiety will decrease Outcome: Progressing   Problem: Pain Managment: Goal: General experience of comfort will improve and/or be controlled 07/23/2024 0637 by Boston Morrison, RN Outcome: Progressing 07/23/2024 0634 by Boston Morrison, RN Outcome: Progressing

## 2024-07-23 NOTE — Progress Notes (Addendum)
   07/23/24 1000  Mobility  Activity Ambulated with assistance  Level of Assistance Modified independent, requires aide device or extra time  Assistive Device Four wheel walker  Distance Ambulated (ft) 2000 ft  Activity Response Tolerated fair  Mobility Referral Yes  Mobility visit 1 Mobility  Mobility Specialist Start Time (ACUTE ONLY) 0915  Mobility Specialist Stop Time (ACUTE ONLY) 0930  Mobility Specialist Time Calculation (min) (ACUTE ONLY) 15 min   Mobility Specialist: Progress Note - Visits:2  Pre-Mobility:      HR 86 During Mobility: HR 120 Post-Mobility:    HR 116  Pt agreeable to mobility session - received in bed. C/o back pain throughout. Returned to chair with all needs met - call bell within reach.   Additional comments:  _____________________________________________________________________________  Pt agreeable to mobility session - received in chair. C/o back pain throughout. Returned to bed with all needs met - call bell within reach.    Additional comments:  Virgle Boards, BS Mobility Specialist Please contact via SecureChat or  Rehab office at [phone].

## 2024-07-23 NOTE — TOC Transition Note (Addendum)
 Transition of Care [facility]) - Discharge Note   Patient Details  Name: Ronald Kaiser MRN: [id number] Date of Birth: 11-Apr-1934  [facility] CM/SW Contact:  Marval Gell, RN Phone Number: 07/23/2024, 9:53 AM   Clinical Narrative:     Spoke to patient and confirmed he is comfortable with infusions and agreeable to DC plan of DC to home w HH and IV infusions. Discussed HH providers and cost and switch made from [place] to [place]. [facility] instructed to see patient tomorrow to reinforce infusion education.  [facility] and [facility] aware of plan for DC today.  Please administer today's dose before DC.  Rollator to be delivered to the room. No other CM needs identified for DC   Final next level of care: Home w Home Health Services Barriers to Discharge: No Barriers Identified   Patient Goals and CMS Choice Patient states their goals for this hospitalization and ongoing recovery are:: Return home CMS Medicare.gov Compare Post Acute Care list provided to:: Patient Choice offered to / list presented to : Patient, Spouse      Discharge Placement                       Discharge Plan and Services Additional resources added to the After Visit Summary for   In-house Referral: Clinical Social Work Discharge Planning Services: CM Consult Post Acute Care Choice: Skilled Nursing Facility          DME Arranged: Vannie rolling with seat DME Agency: [employer] Date DME Agency Contacted: 07/23/24 Time DME Agency Contacted: [phone] Representative spoke with at DME Agency: [place] HH Arranged: RN HH Agency: [facility] Date [facility] Agency Contacted: 07/23/24 Time HH Agency Contacted: [phone] Representative spoke with at [facility] Agency: [place]  Social Drivers of Health (SDOH) Interventions SDOH Screenings   Food Insecurity: Patient Declined (07/12/2024)  Housing: Unknown (07/12/2024)  Transportation Needs: Patient Declined (07/12/2024)  Utilities: Patient Declined  (07/12/2024)  Social Connections: Patient Declined (07/12/2024)  Recent Concern: Social Connections - Moderately Isolated (06/07/2024)  Tobacco Use: Medium Risk (07/11/2024)     Readmission Risk Interventions    07/19/2024    6:07 PM 06/10/2024   10:37 AM  Readmission Risk Prevention Plan  Transportation Screening Complete Complete  PCP or Specialist Appt within 3-5 Days  Complete  HRI or Home Care Consult  Complete  Palliative Care Screening  Not Applicable  Medication Review (RN Care Manager) Complete Complete  PCP or Specialist appointment within 3-5 days of discharge Complete   HRI or Home Care Consult Complete   SW Recovery Care/Counseling Consult Complete   Palliative Care Screening Not Applicable   Skilled Nursing Facility Patient Refused

## 2024-07-23 NOTE — Discharge Instructions (Signed)
 Follow with Primary MD [name], [name] in 7 days   Get CBC, CMP, checked  by Primary MD next visit.    Activity: As tolerated with Full fall precautions use walker/cane & assistance as needed   Disposition Home    Diet: Regular Diet   On your next visit with your primary care physician please Get Medicines reviewed and adjusted.   Please request your Prim.MD to go over all Hospital Tests and Procedure/Radiological results at the follow up, please get all Hospital records sent to your Prim MD by signing hospital release before you go home.   If you experience worsening of your admission symptoms, develop shortness of breath, life threatening emergency, suicidal or homicidal thoughts you must seek medical attention immediately by calling 911 or calling your MD immediately  if symptoms less severe.  You Must read complete instructions/literature along with all the possible adverse reactions/side effects for all the Medicines you take and that have been prescribed to you. Take any new Medicines after you have completely understood and accpet all the possible adverse reactions/side effects.   Do not drive, operating heavy machinery, perform activities at heights, swimming or participation in water  activities or provide baby sitting services if your were admitted for syncope or siezures until you have seen by Primary MD or a Neurologist and advised to do so again.  Do not drive when taking Pain medications.    Do not take more than prescribed Pain, Sleep and Anxiety Medications  Special Instructions: If you have smoked or chewed Tobacco  in the last 2 yrs please stop smoking, stop any regular Alcohol  and or any Recreational drug use.  Wear Seat belts while driving.   Please note  You were cared for by a hospitalist during your hospital stay. If you have any questions about your discharge medications or the care you received while you were in the hospital after you are  discharged, you can call the unit and asked to speak with the hospitalist on call if the hospitalist that took care of you is not available. Once you are discharged, your primary care physician will handle any further medical issues. Please note that NO REFILLS for any discharge medications will be authorized once you are discharged, as it is imperative that you return to your primary care physician (or establish a relationship with a primary care physician if you do not have one) for your aftercare needs so that they can reassess your need for medications and monitor your lab values.

## 2024-07-25 ENCOUNTER — Telehealth: Payer: Self-pay | Admitting: Radiation Oncology

## 2024-07-25 NOTE — Telephone Encounter (Signed)
 Called pt to schedule appt with Dr. Patrcia as requested by provider. Pt advised at this time he feels like this is not necessary; he has no complaints or issues and does not feel like this appt is necessary. Pt is refusing appt at this time and was advised to call if he changes his mind. He was advised Dr. Patrcia and team would be notified in case they wanted to f/u with him, he verbalized understanding and was agreeable to this.

## 2024-07-28 ENCOUNTER — Emergency Department (HOSPITAL_COMMUNITY)

## 2024-07-28 ENCOUNTER — Other Ambulatory Visit: Payer: Self-pay

## 2024-07-28 ENCOUNTER — Inpatient Hospital Stay (HOSPITAL_COMMUNITY)
Admission: EM | Admit: 2024-07-28 | Discharge: 2024-08-02 | DRG: 314 | Disposition: A | Discharge status: Hospice | Attending: Family Medicine | Admitting: Family Medicine

## 2024-07-28 DIAGNOSIS — J189 Pneumonia, unspecified organism: Secondary | ICD-10-CM | POA: Diagnosis present

## 2024-07-28 DIAGNOSIS — E669 Obesity, unspecified: Secondary | ICD-10-CM | POA: Diagnosis present

## 2024-07-28 DIAGNOSIS — I4821 Permanent atrial fibrillation: Secondary | ICD-10-CM | POA: Diagnosis present

## 2024-07-28 DIAGNOSIS — Z515 Encounter for palliative care: Secondary | ICD-10-CM | POA: Diagnosis not present

## 2024-07-28 DIAGNOSIS — I2693 Single subsegmental pulmonary embolism without acute cor pulmonale: Secondary | ICD-10-CM | POA: Diagnosis not present

## 2024-07-28 DIAGNOSIS — I2699 Other pulmonary embolism without acute cor pulmonale: Secondary | ICD-10-CM | POA: Diagnosis present

## 2024-07-28 DIAGNOSIS — E871 Hypo-osmolality and hyponatremia: Secondary | ICD-10-CM | POA: Diagnosis present

## 2024-07-28 DIAGNOSIS — R627 Adult failure to thrive: Secondary | ICD-10-CM | POA: Diagnosis present

## 2024-07-28 DIAGNOSIS — Y842 Radiological procedure and radiotherapy as the cause of abnormal reaction of the patient, or of later complication, without mention of misadventure at the time of the procedure: Secondary | ICD-10-CM | POA: Diagnosis present

## 2024-07-28 DIAGNOSIS — Z66 Do not resuscitate: Secondary | ICD-10-CM | POA: Diagnosis present

## 2024-07-28 DIAGNOSIS — C61 Malignant neoplasm of prostate: Secondary | ICD-10-CM | POA: Diagnosis present

## 2024-07-28 DIAGNOSIS — R6510 Systemic inflammatory response syndrome (SIRS) of non-infectious origin without acute organ dysfunction: Secondary | ICD-10-CM | POA: Diagnosis not present

## 2024-07-28 DIAGNOSIS — I480 Paroxysmal atrial fibrillation: Secondary | ICD-10-CM | POA: Diagnosis present

## 2024-07-28 DIAGNOSIS — I951 Orthostatic hypotension: Secondary | ICD-10-CM | POA: Diagnosis present

## 2024-07-28 DIAGNOSIS — I358 Other nonrheumatic aortic valve disorders: Secondary | ICD-10-CM

## 2024-07-28 DIAGNOSIS — L02211 Cutaneous abscess of abdominal wall: Secondary | ICD-10-CM | POA: Diagnosis present

## 2024-07-28 DIAGNOSIS — A419 Sepsis, unspecified organism: Secondary | ICD-10-CM

## 2024-07-28 DIAGNOSIS — N390 Urinary tract infection, site not specified: Secondary | ICD-10-CM | POA: Diagnosis present

## 2024-07-28 DIAGNOSIS — N322 Vesical fistula, not elsewhere classified: Secondary | ICD-10-CM | POA: Diagnosis not present

## 2024-07-28 DIAGNOSIS — K651 Peritoneal abscess: Secondary | ICD-10-CM | POA: Diagnosis not present

## 2024-07-28 DIAGNOSIS — R188 Other ascites: Secondary | ICD-10-CM

## 2024-07-28 DIAGNOSIS — M8610 Other acute osteomyelitis, unspecified site: Secondary | ICD-10-CM | POA: Diagnosis not present

## 2024-07-28 DIAGNOSIS — B9681 Helicobacter pylori [H. pylori] as the cause of diseases classified elsewhere: Secondary | ICD-10-CM | POA: Diagnosis not present

## 2024-07-28 DIAGNOSIS — I82401 Acute embolism and thrombosis of unspecified deep veins of right lower extremity: Secondary | ICD-10-CM | POA: Diagnosis present

## 2024-07-28 DIAGNOSIS — C7951 Secondary malignant neoplasm of bone: Secondary | ICD-10-CM | POA: Diagnosis present

## 2024-07-28 DIAGNOSIS — Z87891 Personal history of nicotine dependence: Secondary | ICD-10-CM

## 2024-07-28 DIAGNOSIS — Z79899 Other long term (current) drug therapy: Secondary | ICD-10-CM

## 2024-07-28 DIAGNOSIS — L89100 Pressure ulcer of unspecified part of back, unstageable: Secondary | ICD-10-CM | POA: Diagnosis present

## 2024-07-28 DIAGNOSIS — D649 Anemia, unspecified: Secondary | ICD-10-CM | POA: Diagnosis present

## 2024-07-28 DIAGNOSIS — T80219A Unspecified infection due to central venous catheter, initial encounter: Secondary | ICD-10-CM | POA: Diagnosis present

## 2024-07-28 DIAGNOSIS — M8630 Chronic multifocal osteomyelitis, unspecified site: Secondary | ICD-10-CM

## 2024-07-28 DIAGNOSIS — Z6822 Body mass index (BMI) 22.0-22.9, adult: Secondary | ICD-10-CM

## 2024-07-28 DIAGNOSIS — E785 Hyperlipidemia, unspecified: Secondary | ICD-10-CM | POA: Diagnosis present

## 2024-07-28 DIAGNOSIS — Y712 Prosthetic and other implants, materials and accessory cardiovascular devices associated with adverse incidents: Secondary | ICD-10-CM | POA: Diagnosis present

## 2024-07-28 DIAGNOSIS — Z9079 Acquired absence of other genital organ(s): Secondary | ICD-10-CM

## 2024-07-28 DIAGNOSIS — I5022 Chronic systolic (congestive) heart failure: Secondary | ICD-10-CM | POA: Diagnosis present

## 2024-07-28 DIAGNOSIS — E43 Unspecified severe protein-calorie malnutrition: Secondary | ICD-10-CM | POA: Diagnosis present

## 2024-07-28 DIAGNOSIS — R54 Age-related physical debility: Secondary | ICD-10-CM | POA: Diagnosis present

## 2024-07-28 DIAGNOSIS — L89151 Pressure ulcer of sacral region, stage 1: Secondary | ICD-10-CM | POA: Diagnosis present

## 2024-07-28 DIAGNOSIS — I959 Hypotension, unspecified: Secondary | ICD-10-CM | POA: Insufficient documentation

## 2024-07-28 DIAGNOSIS — A498 Other bacterial infections of unspecified site: Secondary | ICD-10-CM | POA: Diagnosis not present

## 2024-07-28 DIAGNOSIS — K627 Radiation proctitis: Secondary | ICD-10-CM | POA: Diagnosis present

## 2024-07-28 DIAGNOSIS — L988 Other specified disorders of the skin and subcutaneous tissue: Secondary | ICD-10-CM | POA: Diagnosis not present

## 2024-07-28 DIAGNOSIS — Z888 Allergy status to other drugs, medicaments and biological substances status: Secondary | ICD-10-CM

## 2024-07-28 DIAGNOSIS — K219 Gastro-esophageal reflux disease without esophagitis: Secondary | ICD-10-CM | POA: Diagnosis present

## 2024-07-28 DIAGNOSIS — Z86711 Personal history of pulmonary embolism: Secondary | ICD-10-CM | POA: Diagnosis not present

## 2024-07-28 DIAGNOSIS — R000 Tachycardia, unspecified: Secondary | ICD-10-CM

## 2024-07-28 DIAGNOSIS — M869 Osteomyelitis, unspecified: Secondary | ICD-10-CM

## 2024-07-28 DIAGNOSIS — I252 Old myocardial infarction: Secondary | ICD-10-CM

## 2024-07-28 DIAGNOSIS — B952 Enterococcus as the cause of diseases classified elsewhere: Secondary | ICD-10-CM | POA: Diagnosis not present

## 2024-07-28 DIAGNOSIS — Y848 Other medical procedures as the cause of abnormal reaction of the patient, or of later complication, without mention of misadventure at the time of the procedure: Secondary | ICD-10-CM | POA: Diagnosis present

## 2024-07-28 DIAGNOSIS — R55 Syncope and collapse: Secondary | ICD-10-CM | POA: Diagnosis not present

## 2024-07-28 DIAGNOSIS — I330 Acute and subacute infective endocarditis: Secondary | ICD-10-CM | POA: Diagnosis present

## 2024-07-28 DIAGNOSIS — Z8249 Family history of ischemic heart disease and other diseases of the circulatory system: Secondary | ICD-10-CM

## 2024-07-28 DIAGNOSIS — B9789 Other viral agents as the cause of diseases classified elsewhere: Secondary | ICD-10-CM | POA: Diagnosis not present

## 2024-07-28 DIAGNOSIS — Z1152 Encounter for screening for COVID-19: Secondary | ICD-10-CM

## 2024-07-28 DIAGNOSIS — I2510 Atherosclerotic heart disease of native coronary artery without angina pectoris: Secondary | ICD-10-CM | POA: Diagnosis present

## 2024-07-28 DIAGNOSIS — B964 Proteus (mirabilis) (morganii) as the cause of diseases classified elsewhere: Secondary | ICD-10-CM | POA: Diagnosis not present

## 2024-07-28 DIAGNOSIS — Z923 Personal history of irradiation: Secondary | ICD-10-CM

## 2024-07-28 DIAGNOSIS — I2694 Multiple subsegmental pulmonary emboli without acute cor pulmonale: Secondary | ICD-10-CM | POA: Diagnosis present

## 2024-07-28 DIAGNOSIS — M868X8 Other osteomyelitis, other site: Secondary | ICD-10-CM | POA: Diagnosis not present

## 2024-07-28 DIAGNOSIS — Z881 Allergy status to other antibiotic agents status: Secondary | ICD-10-CM

## 2024-07-28 DIAGNOSIS — M8618 Other acute osteomyelitis, other site: Secondary | ICD-10-CM | POA: Diagnosis not present

## 2024-07-28 DIAGNOSIS — I110 Hypertensive heart disease with heart failure: Secondary | ICD-10-CM | POA: Diagnosis present

## 2024-07-28 DIAGNOSIS — L8990 Pressure ulcer of unspecified site, unspecified stage: Secondary | ICD-10-CM | POA: Insufficient documentation

## 2024-07-28 LAB — CBC WITH DIFFERENTIAL/PLATELET
Basophils Absolute: 0.1 K/uL (ref 0.0–0.1)
Basophils Relative: 1 %
Eosinophils Absolute: 0 K/uL (ref 0.0–0.5)
Eosinophils Relative: 0 %
HCT: 26.8 % — ABNORMAL LOW (ref 39.0–52.0)
Hemoglobin: 8.5 g/dL — ABNORMAL LOW (ref 13.0–17.0)
Lymphocytes Relative: 4 %
Lymphs Abs: 0.4 K/uL — ABNORMAL LOW (ref 0.7–4.0)
MCH: 30.1 pg (ref 26.0–34.0)
MCHC: 31.7 g/dL (ref 30.0–36.0)
MCV: 95 fL (ref 80.0–100.0)
Monocytes Absolute: 0.3 K/uL (ref 0.1–1.0)
Monocytes Relative: 3 %
Neutro Abs: 9.2 K/uL — ABNORMAL HIGH (ref 1.7–7.7)
Neutrophils Relative %: 92 %
Platelets: 262 K/uL (ref 150–400)
RBC: 2.82 MIL/uL — ABNORMAL LOW (ref 4.22–5.81)
RDW: 20.8 % — ABNORMAL HIGH (ref 11.5–15.5)
WBC: 10 K/uL (ref 4.0–10.5)
nRBC: 0 % (ref 0.0–0.2)

## 2024-07-28 LAB — COMPREHENSIVE METABOLIC PANEL WITH GFR
ALT: 14 U/L (ref 0–44)
AST: 21 U/L (ref 15–41)
Albumin: 2.2 g/dL — ABNORMAL LOW (ref 3.5–5.0)
Alkaline Phosphatase: 58 U/L (ref 38–126)
Anion gap: 15 (ref 5–15)
BUN: 31 mg/dL — ABNORMAL HIGH (ref 8–23)
CO2: 23 mmol/L (ref 22–32)
Calcium: 8.1 mg/dL — ABNORMAL LOW (ref 8.9–10.3)
Chloride: 94 mmol/L — ABNORMAL LOW (ref 98–111)
Creatinine, Ser: 0.79 mg/dL (ref 0.61–1.24)
GFR, Estimated: >60 mL/min (ref >60)
Glucose, Bld: 124 mg/dL — ABNORMAL HIGH (ref 70–99)
Potassium: 4.1 mmol/L (ref 3.5–5.1)
Sodium: 132 mmol/L — ABNORMAL LOW (ref 135–145)
Total Bilirubin: 0.2 mg/dL (ref 0.0–1.2)
Total Protein: 6.7 g/dL (ref 6.5–8.1)

## 2024-07-28 LAB — URINALYSIS, W/ REFLEX TO CULTURE (INFECTION SUSPECTED)
Bilirubin Urine: NEGATIVE
Glucose, UA: NEGATIVE mg/dL
Ketones, ur: NEGATIVE mg/dL
Nitrite: NEGATIVE
Protein, ur: 100 mg/dL — AB
RBC / HPF: >50 RBC/hpf (ref 0–5)
Specific Gravity, Urine: 1.019 (ref 1.005–1.030)
WBC, UA: >50 WBC/hpf (ref 0–5)
pH: 5 (ref 5.0–8.0)

## 2024-07-28 LAB — PROTIME-INR
INR: 1.2 (ref 0.8–1.2)
Prothrombin Time: 15.5 s — ABNORMAL HIGH (ref 11.4–15.2)

## 2024-07-28 LAB — I-STAT CG4 LACTIC ACID, ED
Lactic Acid, Venous: 1 mmol/L (ref 0.5–1.9)
Lactic Acid, Venous: 1.6 mmol/L (ref 0.5–1.9)

## 2024-07-28 MED ORDER — ACETAMINOPHEN 500 MG PO TABS: 1000.0000 mg | ORAL_TABLET | Freq: Four times a day (QID) | ORAL | Status: DC | PRN

## 2024-07-28 MED ORDER — SODIUM CHLORIDE 0.9% FLUSH
3.0000 mL | Freq: Two times a day (BID) | INTRAVENOUS | Status: DC
  Administered 2024-07-30 – 2024-08-02 (×5): 3 mL via INTRAVENOUS

## 2024-07-28 MED ORDER — IOHEXOL 350 MG/ML SOLN
75.0000 mL | Freq: Once | INTRAVENOUS | Status: AC | PRN
  Administered 2024-07-28: 75 mL via INTRAVENOUS

## 2024-07-28 MED ORDER — FERROUS SULFATE 325 (65 FE) MG PO TABS
325.0000 mg | ORAL_TABLET | Freq: Every day | ORAL | Status: DC
  Administered 2024-07-29 – 2024-08-02 (×5): 325 mg via ORAL
  Filled 2024-07-28 (×5): qty 1

## 2024-07-28 MED ORDER — ADULT MULTIVITAMIN W/MINERALS CH
1.0000 | ORAL_TABLET | Freq: Every day | ORAL | Status: DC
  Administered 2024-07-29 – 2024-08-02 (×5): 1 via ORAL
  Filled 2024-07-28 (×5): qty 1

## 2024-07-28 MED ORDER — MELATONIN 3 MG PO TABS: 6.0000 mg | ORAL_TABLET | Freq: Every evening | ORAL | Status: DC | PRN

## 2024-07-28 MED ORDER — PANTOPRAZOLE SODIUM 40 MG PO TBEC
40.0000 mg | DELAYED_RELEASE_TABLET | Freq: Every day | ORAL | Status: DC
  Administered 2024-07-29 – 2024-08-02 (×5): 40 mg via ORAL
  Filled 2024-07-28 (×5): qty 1

## 2024-07-28 MED ORDER — HEPARIN (PORCINE) 25000 UT/250ML-% IV SOLN
1700.0000 [IU]/h | INTRAVENOUS | Status: DC
  Administered 2024-07-28: 1000 [IU]/h via INTRAVENOUS
  Administered 2024-07-30: 1700 [IU]/h via INTRAVENOUS
  Administered 2024-07-30: 1400 [IU]/h via INTRAVENOUS
  Administered 2024-07-31: 1700 [IU]/h via INTRAVENOUS
  Filled 2024-07-28 (×4): qty 250

## 2024-07-28 MED ORDER — PIPERACILLIN-TAZOBACTAM 3.375 G IVPB 30 MIN
3.3750 g | Freq: Once | INTRAVENOUS | Status: AC
  Administered 2024-07-28: 3.375 g via INTRAVENOUS
  Filled 2024-07-28: qty 50

## 2024-07-28 MED ORDER — MIDODRINE HCL 5 MG PO TABS: 10.0000 mg | ORAL_TABLET | Freq: Three times a day (TID) | ORAL | Status: DC

## 2024-07-28 MED ORDER — ENSURE PLUS HIGH PROTEIN PO LIQD
237.0000 mL | Freq: Three times a day (TID) | ORAL | Status: DC
  Administered 2024-07-29 – 2024-08-02 (×11): 237 mL via ORAL
  Filled 2024-07-28 (×2): qty 237

## 2024-07-28 MED ORDER — PRAVASTATIN SODIUM 40 MG PO TABS
20.0000 mg | ORAL_TABLET | Freq: Every evening | ORAL | Status: DC
  Administered 2024-07-29 – 2024-08-01 (×4): 20 mg via ORAL
  Filled 2024-07-28 (×5): qty 1

## 2024-07-28 MED ORDER — SUCRALFATE 1 GM/10ML PO SUSP
2.0000 g | Freq: Every day | ORAL | Status: DC
  Administered 2024-07-29 – 2024-08-02 (×4): 2 g via RECTAL
  Filled 2024-07-28 (×5): qty 20

## 2024-07-28 MED ORDER — ALBUTEROL SULFATE (2.5 MG/3ML) 0.083% IN NEBU
2.5000 mg | INHALATION_SOLUTION | RESPIRATORY_TRACT | Status: DC | PRN
  Filled 2024-07-28: qty 3

## 2024-07-28 MED ORDER — PIPERACILLIN-TAZOBACTAM 3.375 G IVPB
3.3750 g | Freq: Three times a day (TID) | INTRAVENOUS | Status: DC
  Administered 2024-07-29 – 2024-08-01 (×10): 3.375 g via INTRAVENOUS
  Filled 2024-07-28 (×10): qty 50

## 2024-07-28 MED ORDER — VANCOMYCIN HCL 1500 MG/300ML IV SOLN
1500.0000 mg | INTRAVENOUS | Status: DC
  Administered 2024-07-28: 1500 mg via INTRAVENOUS
  Filled 2024-07-28 (×2): qty 300

## 2024-07-28 MED ORDER — LACTATED RINGERS IV SOLN: INTRAVENOUS | Status: AC

## 2024-07-28 MED ORDER — POLYETHYLENE GLYCOL 3350 17 G PO PACK: 17.0000 g | PACK | Freq: Every day | ORAL | Status: DC | PRN

## 2024-07-28 MED ORDER — ONDANSETRON HCL 4 MG/2ML IJ SOLN: 4.0000 mg | Freq: Four times a day (QID) | INTRAMUSCULAR | Status: DC | PRN

## 2024-07-28 NOTE — ED Triage Notes (Addendum)
 Patient BIB EMS for code sepsis. Patient reportedly admitted several days ago for leaking bowel. Patient reports he was discharged with PICC line for IV Rocephin  at home. Had last dose at 1200 today.   T 99.7 BP 132/72 P 118 R 28 O2 96 CBG 146

## 2024-07-28 NOTE — Progress Notes (Signed)
 PHARMACY - ANTICOAGULATION CONSULT NOTE  Pharmacy Consult for heparin  Indication: pulmonary embolus  Allergies  Allergen Reactions   Bactrim [Sulfamethoxazole-Trimethoprim] Hives, Shortness Of Breath and Swelling   Augmentin [Amoxicillin-Pot Clavulanate] Diarrhea   Crestor  [Rosuvastatin  Calcium ] Other (See Comments)    Back pain Grogginess  Dizziness    Zetia  [Ezetimibe ] Other (See Comments)    Back pain Grogginess  Dizziness     Patient Measurements: Height: 5' 11 (180.3 cm) Weight: 67 kg (147 lb 11.3 oz) IBW/kg (Calculated) : 75.3 HEPARIN  DW (KG): 67  Vital Signs: Temp: 98.4 F (36.9 C) (10/26 2021) Temp Source: Oral (10/26 2021) BP: 111/58 (10/26 2100) Pulse Rate: 109 (10/26 2100)  Labs: Recent Labs    07/28/24 2021  HGB 8.5*  HCT 26.8*  PLT 262  LABPROT 15.5*  INR 1.2  CREATININE 0.79    Estimated Creatinine Clearance: 60.5 mL/min (by C-G formula based on SCr of 0.79 mg/dL).   Medical History: Past Medical History:  Diagnosis Date   AKI (acute kidney injury) 11/12/2018   CAD (coronary artery disease)    CHF (congestive heart failure) (HCC)    Dyspnea    Foley catheter in place    HTN (hypertension)    Hyperlipidemia    Myocardial infarction (HCC) 2000   REPORTS HE HAS HAD 5 HEART ATTACKS BUT SOME OF THE WERE NOT SERIOUS    Obesity    Prostate cancer (HCC)       Assessment: 52 yoM with PE on chest CT. Pt with recent infected rectus sheath hematoma so MD consulted pharmacy to dose IV heparin  with no bolus. No AC PTA. CBC low but stable.  Goal of Therapy:  Heparin  level 0.3-0.7 units/ml Monitor platelets by anticoagulation protocol: Yes   Plan:  Heparin  1000 units/h - no bolus Check heparin  level in 8h  Ozell Jamaica, PharmD, [place], [facility] Clinical Pharmacist [phone] Please check AMION for all [facility] numbers 07/28/2024

## 2024-07-28 NOTE — Progress Notes (Signed)
 Pharmacy Antibiotic Note  Ronald Kaiser is a 89 y.o. male admitted on 07/28/2024 with sepsis.  Pharmacy has been consulted for vancomycin  dosing. Cr <1. Pt on rocephin  longterm for rectus sheath abscess. Zosyn x1 given in ER.  Plan: Vancomycin  1500mg  IV q24h - est AUC 511  Height: 5' 11 (180.3 cm) Weight: 67 kg (147 lb 11.3 oz) IBW/kg (Calculated) : 75.3  Temp (24hrs), Avg:98.4 F (36.9 C), Min:98.4 F (36.9 C), Max:98.4 F (36.9 C)  Recent Labs  Lab 07/22/24 0700 07/28/24 2021 07/28/24 2054  WBC 7.4 10.0  --   CREATININE 0.62 0.79  --   LATICACIDVEN  --   --  1.6    Estimated Creatinine Clearance: 60.5 mL/min (by C-G formula based on SCr of 0.79 mg/dL).    Allergies  Allergen Reactions   Bactrim [Sulfamethoxazole-Trimethoprim] Hives, Shortness Of Breath and Swelling   Augmentin [Amoxicillin-Pot Clavulanate] Diarrhea   Crestor  [Rosuvastatin  Calcium ] Other (See Comments)    Back pain Grogginess  Dizziness    Zetia  [Ezetimibe ] Other (See Comments)    Back pain Grogginess  Dizziness      Ozell Jamaica, PharmD, BCPS, [facility] Clinical Pharmacist [phone] Please check AMION for all [facility] Pharm[facility] numbers 07/28/2024

## 2024-07-28 NOTE — H&P (Incomplete)
 History and Physical    Ronald Kaiser FMW:[id number] DOB: 19-Dec-1933 DOA: 07/28/2024  PCP: [name], [name]   Patient coming from: {Blank single:[id number]::Home,Clinic,SNF,ALF,Group Home,Hospice,***}   Chief Complaint:  Chief Complaint  Patient presents with   Code Sepsis    HPI:  Ronald Kaiser is a 89 y.o. male with hx of metastatic prostate cancer, hx TURP, on ADT, hx radiation therapy c/b radiation proctitis, and recent complex hospitalization 10/8-21 with pubic symphysis osteomyelitis, as well as fistulous tract between the bladder and the lower anterior abdominal wall abscess, with Ucx and aspiration of the abd wall abscess by IR both + Proteus Mirabilis, felt to not be a surgical candidate, PICC line placed and was discharged with plan for 6 weeks of CTX and possible suppressive abx thereafter; additional hx includes CAD, HFrEF, Afib not on AC, orthostatic hypotension, anemia.  Presented with progressive weakness ***        Review of Systems:  ROS complete and negative except as marked above   Allergies  Allergen Reactions   Bactrim [Sulfamethoxazole-Trimethoprim] Hives, Shortness Of Breath and Swelling   Augmentin [Amoxicillin-Pot Clavulanate] Diarrhea   Crestor  [Rosuvastatin  Calcium ] Other (See Comments)    Back pain Grogginess  Dizziness    Zetia  [Ezetimibe ] Other (See Comments)    Back pain Grogginess  Dizziness     Prior to Admission medications   Medication Sig Start Date End Date Taking? Authorizing Provider  Calcium  Carb-Cholecalciferol (CALCIUM  + VITAMIN D3 PO) Take 1 tablet by mouth daily.   Yes [provider]  cefTRIAXone  (ROCEPHIN ) IVPB Inject 2 g into the vein daily. Indication:  Intra-abdominal abscess First Dose: Yes Last Day of Therapy:  08/23/24 Labs - Once weekly:  CBC/D and BMP, Labs - Once weekly: ESR and CRP Method of administration: IV Push or per SNF protocol (i.e IVPB) Method of administration may be changed  at the discretion of home infusion pharmacist based upon assessment of the patient and/or caregiver's ability to self-administer the medication ordered. 07/23/24 08/27/24 Yes Elgergawy, Brayton RAMAN, MD  feeding supplement (ENSURE PLUS HIGH PROTEIN) LIQD Take 237 mLs by mouth 3 (three) times daily between meals. 07/23/24  Yes Elgergawy, Brayton RAMAN, MD  ferrous sulfate  325 (65 FE) MG EC tablet Take 325 mg by mouth daily with breakfast.   Yes [provider]  midodrine  (PROAMATINE ) 10 MG tablet Take 1 tablet (10 mg total) by mouth 3 (three) times daily with meals. 07/23/24  Yes Elgergawy, Brayton RAMAN, MD  Multiple Vitamins-Minerals (CENTRUM SILVER MEN 50+) TABS Take 1 tablet by mouth daily.   Yes [provider]  pantoprazole  (PROTONIX ) 40 MG tablet Take 1 tablet (40 mg total) by mouth daily. 07/23/24  Yes Elgergawy, Brayton RAMAN, MD  pentoxifylline (TRENTAL) 400 MG CR tablet Take 1 tablet (400 mg total) by mouth 3 (three) times daily with meals. 07/23/24  Yes Elgergawy, Brayton RAMAN, MD  pravastatin  (PRAVACHOL ) 20 MG tablet Take 1 tablet (20 mg total) by mouth every evening. 08/01/23  Yes Pietro Redell RAMAN, MD  vitamin E 180 MG (400 UNITS) capsule Take 2 capsules (800 Units total) by mouth daily. 07/23/24  Yes Elgergawy, Brayton RAMAN, MD  acetaminophen  (TYLENOL ) 325 MG tablet Take 2 tablets (650 mg total) by mouth every 6 (six) hours as needed for mild pain (pain score 1-3) or fever (or Fever >/= 101). Patient not taking: Reported on 07/28/2024 07/23/24   Elgergawy, Brayton RAMAN, MD  enzalutamide  (XTANDI ) 80 MG tablet Take 2 tablets (160 mg  total) by mouth daily. Resume home meds Patient not taking: Reported on 07/28/2024 07/23/24   Elgergawy, Brayton RAMAN, MD  nitroGLYCERIN  (NITROSTAT ) 0.4 MG SL tablet Place 1 tablet (0.4 mg total) under the tongue every 5 (five) minutes as needed. Patient not taking: Reported on 07/28/2024 04/17/17   Pietro Redell RAMAN, MD  relugolix (ORGOVYX) 120 MG tablet Take 1 tablet (120 mg  total) by mouth daily. Resume home meds Patient not taking: Reported on 07/28/2024 07/23/24   Elgergawy, Brayton RAMAN, MD    Past Medical History:  Diagnosis Date   AKI (acute kidney injury) 11/12/2018   CAD (coronary artery disease)    CHF (congestive heart failure) (HCC)    Dyspnea    Foley catheter in place    HTN (hypertension)    Hyperlipidemia    Myocardial infarction (HCC) 2000   REPORTS HE HAS HAD 5 HEART ATTACKS BUT SOME OF THE WERE NOT SERIOUS    Obesity    Prostate cancer [facility])     Past Surgical History:  Procedure Laterality Date   CARDIAC CATHETERIZATION N/A 03/09/2016   Procedure: Left Heart Cath and Coronary Angiography;  Surgeon: Victory LELON Sharps, MD;  Location: [facility] INVASIVE CV LAB;  Service: Cardiovascular;  Laterality: N/A;   COLONOSCOPY N/A 05/28/2024   Procedure: COLONOSCOPY;  Surgeon: Leigh Elspeth SQUIBB, MD;  Location: [facility] ENDOSCOPY;  Service: Gastroenterology;  Laterality: N/A;   CYSTOSCOPY W/ RETROGRADES Bilateral 08/19/2020   Procedure: BILATERAL RETROGRADE PYELOGRAM;  Surgeon: Cam Morene LELON, MD;  Location: [facility] ORS;  Service: Urology;  Laterality: Bilateral;   ESOPHAGOGASTRODUODENOSCOPY N/A 05/28/2024   Procedure: EGD (ESOPHAGOGASTRODUODENOSCOPY);  Surgeon: Leigh Elspeth SQUIBB, MD;  Location: [facility] ENDOSCOPY;  Service: Gastroenterology;  Laterality: N/A;   RADIATION TO PROSTATE     Stenting of LAD  2000   TONSILLECTOMY     TRANSURETHRAL RESECTION OF PROSTATE N/A 03/01/2019   Procedure: TRANSURETHRAL RESECTION OF THE PROSTATE (TURP);  Surgeon: Cam Morene LELON, MD;  Location: [facility] ORS;  Service: Urology;  Laterality: N/A;  75 MINS   TRANSURETHRAL RESECTION OF PROSTATE N/A 08/19/2020   Procedure: TRANSURETHRAL RESECTION OF THE PROSTATE (TURP);  Surgeon: Cam Morene LELON, MD;  Location: [facility] ORS;  Service: Urology;  Laterality: N/A;     reports that he quit smoking about 61 years ago. His smoking use included cigarettes. He started smoking about 68 years ago. He  has a 3.5 pack-year smoking history. He has never used smokeless tobacco. He reports current alcohol use. He reports that he does not currently use drugs.  Family History  Problem Relation Age of Onset   Hypertension Father    Breast cancer Neg Hx    Prostate cancer Neg Hx    Colon cancer Neg Hx    Pancreatic cancer Neg Hx      Physical Exam: Vitals:   07/28/24 2015 07/28/24 2020 07/28/24 2021 07/28/24 2100  BP:  132/74 132/74 (!) 111/58  Pulse:  (!) 121 (!) 125 (!) 109  Resp:  19 (!) 32 (!) 22  Temp:   98.4 F (36.9 C)   TempSrc:   Oral   SpO2:  97% 99% 95%  Weight: 67 kg     Height: 5' 11 (1.803 m)       Gen: Awake, alert, NAD ***  CV: Regular, normal S1, S2, no murmurs  Resp: Normal WOB, CTAB  Abd: Flat, normoactive, nontender MSK: Symmetric, no edema  Skin: No rashes or lesions to exposed skin  Neuro: Alert and interactive  Psych: euthymic, appropriate    Data review:   Labs reviewed, notable for:   NA 132 Albumin 2.2 Lactate 1.6 WBC 10 Hemoglobin 8.5, MCV 95 Platelet 262 INR 1.2 UA few bacteria, pyuria large leukocyte, blood  Micro:  Results for orders placed or performed during the hospital encounter of 07/10/24  Blood Culture (routine x 2)     Status: None   Collection Time: 07/10/24  7:05 PM   Specimen: BLOOD  Result Value Ref Range Status   Specimen Description BLOOD RIGHT ANTECUBITAL  Final   Special Requests   Final    BOTTLES DRAWN AEROBIC AND ANAEROBIC Blood Culture adequate volume   Culture   Final    NO GROWTH 5 DAYS Performed at [facility], [address] [place], [place] [address]    Report Status 07/15/2024 FINAL  Final  Blood Culture (routine x 2)     Status: None   Collection Time: 07/10/24  7:10 PM   Specimen: BLOOD RIGHT FOREARM  Result Value Ref Range Status   Specimen Description BLOOD RIGHT FOREARM  Final   Special Requests   Final    BOTTLES DRAWN AEROBIC AND ANAEROBIC Blood Culture adequate volume   Culture    Final    NO GROWTH 5 DAYS Performed at [facility], [address] [place], [place] [address]    Report Status 07/15/2024 FINAL  Final  Urine Culture (for pregnant, neutropenic or urologic patients or patients with an indwelling urinary catheter)     Status: Abnormal   Collection Time: 07/11/24 12:47 AM   Specimen: Urine, Clean Catch  Result Value Ref Range Status   Specimen Description URINE, CLEAN CATCH  Final   Special Requests   Final    Immunocompromised Performed at [facility], [address] [place], [place] [address]    Culture >=100,000 COLONIES/mL PROTEUS MIRABILIS (A)  Final   Report Status 07/13/2024 FINAL  Final   Organism ID, Bacteria PROTEUS MIRABILIS (A)  Final      Susceptibility   Proteus mirabilis - MIC*    AMPICILLIN <=2 SENSITIVE Sensitive     CEFAZOLIN (URINE) Value in next row Sensitive      <=1 SENSITIVEThis is a modified FDA-approved test that has been validated and its performance characteristics determined by the reporting laboratory.  This laboratory is certified under the Clinical Laboratory Improvement Amendments CLIA as qualified to perform high complexity clinical laboratory testing.    CEFEPIME  Value in next row Sensitive      <=1 SENSITIVEThis is a modified FDA-approved test that has been validated and its performance characteristics determined by the reporting laboratory.  This laboratory is certified under the Clinical Laboratory Improvement Amendments CLIA as qualified to perform high complexity clinical laboratory testing.    ERTAPENEM Value in next row Sensitive      <=1 SENSITIVEThis is a modified FDA-approved test that has been validated and its performance characteristics determined by the reporting laboratory.  This laboratory is certified under the Clinical Laboratory Improvement Amendments CLIA as qualified to perform high complexity clinical laboratory testing.    CEFTRIAXONE  Value in next row Sensitive      <=1 SENSITIVEThis  is a modified FDA-approved test that has been validated and its performance characteristics determined by the reporting laboratory.  This laboratory is certified under the Clinical Laboratory Improvement Amendments CLIA as qualified to perform high complexity clinical laboratory testing.    CIPROFLOXACIN  Value in next row Sensitive      <=  1 SENSITIVEThis is a modified FDA-approved test that has been validated and its performance characteristics determined by the reporting laboratory.  This laboratory is certified under the Clinical Laboratory Improvement Amendments CLIA as qualified to perform high complexity clinical laboratory testing.    GENTAMICIN  Value in next row Sensitive      <=1 SENSITIVEThis is a modified FDA-approved test that has been validated and its performance characteristics determined by the reporting laboratory.  This laboratory is certified under the Clinical Laboratory Improvement Amendments CLIA as qualified to perform high complexity clinical laboratory testing.    NITROFURANTOIN Value in next row Resistant      <=1 SENSITIVEThis is a modified FDA-approved test that has been validated and its performance characteristics determined by the reporting laboratory.  This laboratory is certified under the Clinical Laboratory Improvement Amendments CLIA as qualified to perform high complexity clinical laboratory testing.    TRIMETH/SULFA Value in next row Sensitive      <=1 SENSITIVEThis is a modified FDA-approved test that has been validated and its performance characteristics determined by the reporting laboratory.  This laboratory is certified under the Clinical Laboratory Improvement Amendments CLIA as qualified to perform high complexity clinical laboratory testing.    AMPICILLIN/SULBACTAM Value in next row Sensitive      <=1 SENSITIVEThis is a modified FDA-approved test that has been validated and its performance characteristics determined by the reporting laboratory.  This laboratory  is certified under the Clinical Laboratory Improvement Amendments CLIA as qualified to perform high complexity clinical laboratory testing.    PIP/TAZO Value in next row Sensitive      <=4 SENSITIVEThis is a modified FDA-approved test that has been validated and its performance characteristics determined by the reporting laboratory.  This laboratory is certified under the Clinical Laboratory Improvement Amendments CLIA as qualified to perform high complexity clinical laboratory testing.    MEROPENEM Value in next row Sensitive      <=4 SENSITIVEThis is a modified FDA-approved test that has been validated and its performance characteristics determined by the reporting laboratory.  This laboratory is certified under the Clinical Laboratory Improvement Amendments CLIA as qualified to perform high complexity clinical laboratory testing.    * >=100,000 COLONIES/mL PROTEUS MIRABILIS  Aerobic/Anaerobic Culture w Gram Stain (surgical/deep wound)     Status: None   Collection Time: 07/12/24  3:09 PM   Specimen: Abscess  Result Value Ref Range Status   Specimen Description ABSCESS  Final   Special Requests LLQ  Final   Gram Stain   Final    ABUNDANT WBC PRESENT, PREDOMINANTLY PMN RARE GRAM POSITIVE COCCI IN CLUSTERS    Culture   Final    MODERATE PROTEUS MIRABILIS FEW AEROCOCCUS SPECIES Standardized susceptibility testing for this organism is not available. Performed at [facility], [address], [place], [place] [address]    Report Status 07/17/2024 FINAL  Final   Organism ID, Bacteria PROTEUS MIRABILIS  Final      Susceptibility   Proteus mirabilis - MIC*    AMPICILLIN <=2 SENSITIVE Sensitive     CEFAZOLIN (NON-URINE) 4 INTERMEDIATE Intermediate     CEFEPIME  <=0.12 SENSITIVE Sensitive     ERTAPENEM <=0.12 SENSITIVE Sensitive     CEFTRIAXONE  <=0.25 SENSITIVE Sensitive     CIPROFLOXACIN  <=0.06 SENSITIVE Sensitive     GENTAMICIN  <=1 SENSITIVE Sensitive     MEROPENEM <=0.25 SENSITIVE  Sensitive     TRIMETH/SULFA <=20 SENSITIVE Sensitive     AMPICILLIN/SULBACTAM <=2 SENSITIVE Sensitive  PIP/TAZO Value in next row Sensitive      <=4 SENSITIVEThis is a modified FDA-approved test that has been validated and its performance characteristics determined by the reporting laboratory.  This laboratory is certified under the Clinical Laboratory Improvement Amendments CLIA as qualified to perform high complexity clinical laboratory testing.    * MODERATE PROTEUS MIRABILIS    Imaging reviewed:  CT ABDOMEN PELVIS W CONTRAST Result Date: 07/28/2024 EXAM: CT ABDOMEN AND PELVIS WITH CONTRAST 07/28/2024 09:27:51 PM TECHNIQUE: CT of the abdomen and pelvis was performed with the administration of 75 mL of iohexol  (OMNIPAQUE ) 350 MG/ML injection. Multiplanar reformatted images are provided for review. Automated exposure control, iterative reconstruction, and/or weight-based adjustment of the mA/kV was utilized to reduce the radiation dose to as low as reasonably achievable. COMPARISON: 07/17/2024 CLINICAL HISTORY: Eval for increasing size of abdominal wall abscess. Patient BIB EMS for code sepsis. Patient reportedly admitted several days ago for leaking bowel. Patient reports he was discharged with PICC line for IV Rocephin  at home. FINDINGS: LOWER CHEST: No acute abnormality. LIVER: The liver is unremarkable. GALLBLADDER AND BILE DUCTS: Gallbladder is unremarkable. No biliary ductal dilatation. SPLEEN: No acute abnormality. PANCREAS: Parenchymal calcifications along the pancreaticoduodenal groove (image 40), chronic. ADRENAL GLANDS: No acute abnormality. KIDNEYS, URETERS AND BLADDER: No stones in the kidneys or ureters. No hydronephrosis. No perinephric or periureteral stranding. Foley catheter with indwelling contrast within a decompressed, thick-walled bladder. GI AND BOWEL: Stomach demonstrates no acute abnormality. There is no bowel obstruction. Moderate colonic stool burden. PERITONEUM AND  RETROPERITONEUM: No ascites. No free air. VASCULATURE: Abdominal aortic atherosclerosis. Inferior abdominal aorta measures 2.8 cm, within normal limits. Consider follow-up ultrasound in 5 years, if clinically warranted. LYMPH NODES: No lymphadenopathy. REPRODUCTIVE ORGANS: No acute abnormality. BONES AND SOFT TISSUES: Destructive changes involving the parasympathetic region, left greater than right, suggesting osteomyelitis/septic joint. 4.0 x 8.3 cm fluid and gas collection within the midline lower pelvic wall (image 73), similar to the prior, although now with increased gas and decreased fluid. Inferiorly, there is phlegmonous change within the left lower perineum (image 84), difficult to discretely measure but similar to the prior, with trace residual contrast (image 82), improved. IMPRESSION: 1. Thick-walled bladder, decompressed with Foley catheter and indwelling contrast. 2. Pelvic wall fluid and gas collection measuring 4.0 x 8.3 cm, similar in size to prior, with increased gas and decreased fluid. 3. Left lower perineal phlegmon, overall similar with decreased residual contrast. 4. Osteomyelitis/septic joint involving the pubic symphysis, stable. Electronically signed by: Pinkie Pebbles MD 07/28/2024 09:45 PM EDT RP Workstation: [id number]   CT Angio Chest PE W and/or Wo Contrast Result Date: 07/28/2024 EXAM: CTA of the Chest with contrast for PE 07/28/2024 09:27:51 PM TECHNIQUE: CTA of the chest was performed after the administration of intravenous contrast. Multiplanar reformatted images are provided for review. MIP images are provided for review. Automated exposure control, iterative reconstruction, and/or weight based adjustment of the mA/kV was utilized to reduce the radiation dose to as low as reasonably achievable. COMPARISON: CT of the chest 07/10/2024. CLINICAL HISTORY: PE workup. Patient BIB EMS for code sepsis. Patient reportedly admitted several days ago for leaking bowel. Patient reports  he was discharged with PICC line for IV Rocephin  at home. FINDINGS: PULMONARY ARTERIES: Pulmonary arteries are adequately opacified for evaluation. Segmental right lower lobe pulmonary emboli are present. Segmental pulmonary emboli in the left upper lobe. Main pulmonary artery is normal in caliber. MEDIASTINUM: The heart and pericardium demonstrate no acute abnormality. There are atherosclerotic  calcifications of the aorta and coronary arteries. There is no acute abnormality of the thoracic aorta. LYMPH NODES: No mediastinal, hilar or axillary lymphadenopathy. LUNGS AND PLEURA: Patchy ground-glass and interstitial opacities are seen in the bilateral lower lobes. There is some atelectasis in the lingula. No pleural effusion or pneumothorax. UPPER ABDOMEN: Limited images of the upper abdomen are unremarkable. SOFT TISSUES AND BONES: Sclerotic density in the left seventh rib appears unchanged. Sclerotic density in the posterior right eighth rib appears unchanged. There are healed posterior inferior right rib fractures. T11 and T12 compression deformities are unchanged. There is marked retropulsion of fracture fragments at T12, unchanged. Sclerotic densities in T10 and L1 are unchanged. No acute soft tissue abnormality. IMPRESSION: 1. Acute segmental pulmonary emboli in the right lower lobe and left upper lobe. No evidence for right heart strain. 2. Patchy ground-glass and interstitial opacities in the bilateral lower lobes, which may reflect infection/inflammation or edema. Lingular atelectasis. 3. Atherosclerotic calcifications of the aorta and coronary arteries. 4. Stable sclerotic metastatic osseous lesions. Electronically signed by: Greig Pique MD 07/28/2024 09:40 PM EDT RP Workstation: [id number]   DG Chest Port 1 View if patient is in a treatment room. Result Date: 07/28/2024 EXAM: 1 VIEW XRAY OF THE CHEST 07/28/2024 08:46:00 PM COMPARISON: Compared to May 10, 2024. CLINICAL HISTORY: Suspected Sepsis.  Table formatting from the original note was not included.; Suspected Sepsis Suspected Sepsis. Table formatting from the original note was not included.; Suspected Sepsis FINDINGS: LINES, TUBES AND DEVICES: Right upper extremity PICC line is seen within the superior vena cava. LUNGS AND PLEURA: Patchy infiltrate is seen within the left mid lung zone. There is increasing streaky infiltrate within the left lung base, possibly infectious in the acute setting. The right lung is clear, with sacral linear atelectasis of the right lung base. No pneumothorax or pleural effusion. HEART AND MEDIASTINUM: Cardiac size is within normal limits. BONES AND SOFT TISSUES: No acute osseous abnormality. IMPRESSION: 1. Patchy infiltrate in the left mid lung zone and increasing streaky infiltrate in the left lung base, possibly infectious in the acute setting. Follow-up chest radiograph is recommended in 3-4 weeks, following conservative therapy, to document resolution. Electronically signed by: Dorethia Molt MD 07/28/2024 08:55 PM EDT RP Workstation: [id number]    EKG:  Personally reviewed, sinus rhythm, LAD, RBBB, discordant ST changes without overt ischemic change.    ED Course:  Found to have segmental PE without right heart strain, possible pneumonia, UTI.  Was started on heparin  drip for PE and treated with vancomycin  and Zosyn   Assessment/Plan:  89 y.o. male with hx metastatic prostate cancer, hx TURP, on ADT, hx radiation therapy c/b radiation proctitis, and recent complex hospitalization 10/8-21 with pubic symphysis osteomyelitis, as well as fistulous tract between the bladder and the lower anterior abdominal wall abscess, with Ucx and aspiration of the abd wall abscess by IR both + Proteus Mirabilis, felt to not be a surgical candidate, PICC line placed and was discharged with plan for 6 weeks of CTX and possible suppressive abx thereafter; additional hx includes CAD, HFrEF, Afib not on AC, orthostatic hypotension,  anemia. Presented with progressive weakness now found to have segmental PE without right heart strain, question of pneumonia, and suspected ongoing urinary/abdominal wall infection  Segmental pulmonary embolism RLL, LUL, without RV strain *** BNP / trop pending. EKG with old RBBB. CTA with PE as described above. Case is complicated especially with hx of recent hx GI bleeding related to radiation proctitis and high risk  of recurrent bleeding. May benefit from short term anticoagulation, although if rebleeding develops then likely will need discontinuation.  -- Continue heparin  drip for now; further decisions on anticoagulation pending his course in the short term, as well as overall goals.  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 UA remains consistent with infection.  CT abdomen pelvis with thick-walled bladder decompressed, similar size of pelvic wall collection 4 x 8.3 cm increased gas, decreased fluid.  Left lower perineal phlegmon similar and decreased residual contrast, stable pubic symphysis osteomyelitis. - Was broadened to vancomycin  and Zosyn in setting of possible ongoing urinary infection, possible pneumonia.  - Continue broader coverage for now, follow-up urine culture, blood culture. - Pending culture data anticipate return back to ceftriaxone  for coverage of Proteus and course per prior ID rec x 6 weeks  Patchy ground glass opacities in bases -? Pulm edema v pneumonia   Currently on room air. CT Chest GGO in bilateral LL ? Infection or edema.  -- Abx coverage per above  -- Check flu/COVID/RSV, sputum culture, MRSA nares - Nebs as needed, I-S, out of bed to chair as able  SIRS v sepsis  On initial presentation is  tachycardic in the 120s, tachypneic in 20s 30s, WBC 10 up from prior, lactate 1.6.  Unclear if he is septic related to his ongoing complex urinary infection/abdominal wall abscess/pubic osteomyelitis versus questionable pneumonia versus new PE. - Antibiotic coverage per above - His lactate is normal I will hold off on large-volume fluids pending BNP considering question of pulmonary edema.  Start on maintenance fluids 75 cc an hour for now.  Repeat lactate x 1  Hyponatremia, mild  suspect hypovolemic and low solute - Trend BMP  Deconditioning - PT/OT evaluation  Goals of care Overall very ill gentleman with metastatic prostate cancer and associated complications related to his radiation therapy and now with infection with urinary fistula connecting to abdominal wall abscess and pubic symphysis osteomyelitis who is not a surgical candidate, and now additional acute issues including pulmonary embolism and high risk anticoagulation with the possibility of recurrent GI bleeding.  ***  - For now he would like to ***  - Introduced concepts of palliative care ***   Chronic medical problems: metastatic prostate cancer: hx TURP, on ADT, hx radiation therapy c/b radiation proctitis, pubic symphysis ORN. Followed by urology. Home Xtandi , and Orgovyx are on hold per his report. Otherwise had been started on Pentoxyfylline for question of pubic symphysis osteonecrosis; considering that he is starting on anticoagulation I would discontinue Pentoxyfylline due to risk of bleeding.  CAD; ? PAD: See above re: AC. *** antiPLT. Continue pravastatin  HFrEF: Echo 07/11/2024 LVEF 25 to 30%, positive wall motion abnormality, aortic root ectasia.  Unable to be on GDMT due to his hypotension requiring midodrine  Afib not on AC: See above note starting on [facility] for PE orthostatic hypotension: Continue midodrine  3 times daily Anemia: Hemoglobin is 8.5 near his recent baseline, close monitoring in the setting of new  anticoagulation per above and history of GI bleed per below.  Check type and screen, repeat iron  panel Hx of GIB 2/2 radiation proctitis: Last colonoscoyp in 8/'25, had severe radiation proctitis which was treated with APC, and was on a course  of sucralfate suppositories thereafter. Resume on daily sucralfate enema ideally to reduce chance of rebleeding.  GERD: continue home PPI   Body mass index is 20.6 kg/m. ***   DVT prophylaxis:  {Blank single:19197::Lovenox,SQ Heparin ,IV heparin  gtts,Xarelto,Eliquis,Coumadin,SCDs,***} Code Status:  {Blank single:19197::Full Code,DNR with Intubation,DNR/DNI(Do NOT Intubate),Comfort Care,***} Diet:  Diet Orders (From admission, onward)    None      Family Communication:  ***  Consults:  ***  Admission status:   {Blank single:19197::Observation,Inpatient}, {Blank single:19197::Med-Surg,Telemetry bed,Step Down Unit}  Severity of Illness: {Observation/Inpatient:21159}   Dorn Dawson, MD [facility]  How to contact the [facility] Attending or Consulting provider 7A - 7P or covering provider during after hours 7P -7A, for this patient.  Check the care team in [facility] and look for a) attending/consulting [facility] provider listed and b) the TRH team listed Log into [email or web address] and use [facility] universal password to access. If you do not have the password, please contact the hospital operator. Locate the TRH provider you are looking for under [facility] and page to a number that you can be directly reached. If you still have difficulty reaching the provider, please page the [facility] (Director on Call) for the Hospitalists listed on amion for assistance.  07/28/2024, 10:28 PM

## 2024-07-28 NOTE — ED Provider Notes (Signed)
 [place] EMERGENCY DEPARTMENT AT [facility] Provider Note   CSN: [id number] Arrival date & time: 07/28/24  2008     Patient presents with: Code Sepsis   Ronald Kaiser is a 89 y.o. male.   HPI     Presents because of weakness.  Patient states that he feels like he is has become increasingly weak.  Endorses chills but no documented fevers at home.  Endorses some lower abdominal pain.  No chest pain or shortness of breath.  No pleuritic chest pain.  No hemoptysis.  Denies any history of DVT or PE.  No blood in stool.  No hematemesis.  Patient still feels like he has some slight abdominal pain in the lower abdomen.  Came in today because ongoing weakness.   Previous medical history reviewed : It was discharged on July 23, 2024.HFrEF 25-30%, Metastatic lesions from prostatic cancer on Xtandi , he is managed currently by Dr. Nieves ,has had radiation proctitis with anemia.  Permanent A-fib.  Patient was admitted this time because intra-abdominal rectus sheath abscess versus infected hematoma.  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  Post aspiration by IR abscess.  Infected rectus sheath hematoma.  Was empirically started on vancomycin  and Zosyn.  Narrowed to Rocephin  given patient was growing pansensitive Proteus.    Prior to Admission medications   Medication Sig Start Date End Date Taking? Authorizing Provider  Calcium  Carb-Cholecalciferol (CALCIUM  + VITAMIN D3 PO) Take 1 tablet by mouth daily.   Yes [provider]  cefTRIAXone  (ROCEPHIN ) IVPB Inject 2 g into the vein daily. Indication:  Intra-abdominal abscess First Dose: Yes Last Day of Therapy:  08/23/24 Labs - Once weekly:  CBC/D and BMP, Labs - Once weekly: ESR and CRP Method of administration: IV Push or per SNF protocol (i.e IVPB) Method of administration may be changed at the discretion of home infusion pharmacist based upon assessment of the patient and/or  caregiver's ability to self-administer the medication ordered. 07/23/24 08/27/24 Yes Elgergawy, Brayton RAMAN, MD  feeding supplement (ENSURE PLUS HIGH PROTEIN) LIQD Take 237 mLs by mouth 3 (three) times daily between meals. 07/23/24  Yes Elgergawy, Brayton RAMAN, MD  ferrous sulfate  325 (65 FE) MG EC tablet Take 325 mg by mouth daily with breakfast.   Yes [provider]  midodrine  (PROAMATINE ) 10 MG tablet Take 1 tablet (10 mg total) by mouth 3 (three) times daily with meals. 07/23/24  Yes Elgergawy, Brayton RAMAN, MD  Multiple Vitamins-Minerals (CENTRUM SILVER MEN 50+) TABS Take 1 tablet by mouth daily.   Yes [provider]  pantoprazole  (PROTONIX ) 40 MG tablet Take 1 tablet (40 mg total) by mouth daily. 07/23/24  Yes Elgergawy, Brayton RAMAN, MD  pentoxifylline (TRENTAL) 400 MG CR tablet Take 1 tablet (400 mg total) by mouth 3 (three) times daily with meals. 07/23/24  Yes Elgergawy, Brayton RAMAN, MD  pravastatin  (PRAVACHOL ) 20 MG tablet Take 1 tablet (20 mg total) by mouth every evening. 08/01/23  Yes Pietro Redell RAMAN, MD  vitamin E 180 MG (400 UNITS) capsule Take 2 capsules (800 Units total) by mouth daily. 07/23/24  Yes Elgergawy, Brayton RAMAN, MD  acetaminophen  (TYLENOL ) 325 MG tablet Take 2 tablets (650 mg total) by mouth every 6 (six) hours as needed for mild pain (pain score 1-3) or fever (or Fever >/= 101). Patient not taking: Reported on 07/28/2024 07/23/24   Elgergawy, Brayton RAMAN, MD  enzalutamide  (XTANDI ) 80 MG tablet Take 2 tablets (160 mg total) by mouth  daily. Resume home meds Patient not taking: Reported on 07/28/2024 07/23/24   Elgergawy, Brayton RAMAN, MD  nitroGLYCERIN  (NITROSTAT ) 0.4 MG SL tablet Place 1 tablet (0.4 mg total) under the tongue every 5 (five) minutes as needed. Patient not taking: Reported on 07/28/2024 04/17/17   Pietro Redell RAMAN, MD  relugolix (ORGOVYX) 120 MG tablet Take 1 tablet (120 mg total) by mouth daily. Resume home meds Patient not taking: Reported on 07/28/2024  07/23/24   Elgergawy, Brayton RAMAN, MD    Allergies: Bactrim [sulfamethoxazole-trimethoprim], Augmentin [amoxicillin-pot clavulanate], Crestor  [rosuvastatin  calcium ], and Zetia  [ezetimibe ]    Review of Systems  Constitutional:  Negative for chills and fever.  HENT:  Negative for ear pain and sore throat.   Eyes:  Negative for pain and visual disturbance.  Respiratory:  Negative for cough and shortness of breath.   Cardiovascular:  Negative for chest pain and palpitations.  Gastrointestinal:  Negative for abdominal pain and vomiting.  Genitourinary:  Negative for dysuria and hematuria.  Musculoskeletal:  Negative for arthralgias and back pain.  Skin:  Negative for color change and rash.  Neurological:  Negative for seizures and syncope.  All other systems reviewed and are negative.   Updated Vital Signs BP (!) 111/58   Pulse (!) 109   Temp 98.4 F (36.9 C) (Oral)   Resp (!) 22   Ht 5' 11 (1.803 m)   Wt 67 kg   SpO2 95%   BMI 20.60 kg/m   Physical Exam Vitals and nursing note reviewed.  Constitutional:      General: He is not in acute distress.    Appearance: He is well-developed.  HENT:     Head: Normocephalic and atraumatic.  Eyes:     Conjunctiva/sclera: Conjunctivae normal.  Cardiovascular:     Rate and Rhythm: Normal rate and regular rhythm.     Heart sounds: No murmur heard. Pulmonary:     Effort: Pulmonary effort is normal. No respiratory distress.     Breath sounds: Normal breath sounds.  Abdominal:     Palpations: Abdomen is soft.     Tenderness: There is no abdominal tenderness.  Musculoskeletal:        General: No swelling.     Cervical back: Neck supple.  Skin:    General: Skin is warm and dry.     Capillary Refill: Capillary refill takes less than 2 seconds.  Neurological:     Mental Status: He is alert.  Psychiatric:        Mood and Affect: Mood normal.     (all labs ordered are listed, but only abnormal results are displayed) Labs Reviewed   COMPREHENSIVE METABOLIC PANEL WITH GFR - Abnormal; Notable for the following components:      Result Value   Sodium 132 (*)    Chloride 94 (*)    Glucose, Bld 124 (*)    BUN 31 (*)    Calcium  8.1 (*)    Albumin 2.2 (*)    All other components within normal limits  CBC WITH DIFFERENTIAL/PLATELET - Abnormal; Notable for the following components:   RBC 2.82 (*)    Hemoglobin 8.5 (*)    HCT 26.8 (*)    RDW 20.8 (*)    Neutro Abs 9.2 (*)    Lymphs Abs 0.4 (*)    All other components within normal limits  PROTIME-INR - Abnormal; Notable for the following components:   Prothrombin Time 15.5 (*)    All other components within normal limits  URINALYSIS,  W/ REFLEX TO CULTURE (INFECTION SUSPECTED) - Abnormal; Notable for the following components:   Color, Urine AMBER (*)    APPearance TURBID (*)    Hgb urine dipstick SMALL (*)    Protein, ur 100 (*)    Leukocytes,Ua LARGE (*)    Bacteria, UA FEW (*)    All other components within normal limits  CULTURE, BLOOD (ROUTINE X 2)  CULTURE, BLOOD (ROUTINE X 2)  URINE CULTURE  HEPARIN  LEVEL (UNFRACTIONATED)  CBC  I-STAT CG4 LACTIC ACID, Ronald  I-STAT CG4 LACTIC ACID, Ronald    EKG: None  Radiology: CT ABDOMEN PELVIS W CONTRAST Result Date: 07/28/2024 EXAM: CT ABDOMEN AND PELVIS WITH CONTRAST 07/28/2024 09:27:51 PM TECHNIQUE: CT of the abdomen and pelvis was performed with the administration of 75 mL of iohexol  (OMNIPAQUE ) 350 MG/ML injection. Multiplanar reformatted images are provided for review. Automated exposure control, iterative reconstruction, and/or weight-based adjustment of the mA/kV was utilized to reduce the radiation dose to as low as reasonably achievable. COMPARISON: 07/17/2024 CLINICAL HISTORY: Eval for increasing size of abdominal wall abscess. Patient BIB EMS for code sepsis. Patient reportedly admitted several days ago for leaking bowel. Patient reports he was discharged with PICC line for IV Rocephin  at home. FINDINGS: LOWER  CHEST: No acute abnormality. LIVER: The liver is unremarkable. GALLBLADDER AND BILE DUCTS: Gallbladder is unremarkable. No biliary ductal dilatation. SPLEEN: No acute abnormality. PANCREAS: Parenchymal calcifications along the pancreaticoduodenal groove (image 40), chronic. ADRENAL GLANDS: No acute abnormality. KIDNEYS, URETERS AND BLADDER: No stones in the kidneys or ureters. No hydronephrosis. No perinephric or periureteral stranding. Foley catheter with indwelling contrast within a decompressed, thick-walled bladder. GI AND BOWEL: Stomach demonstrates no acute abnormality. There is no bowel obstruction. Moderate colonic stool burden. PERITONEUM AND RETROPERITONEUM: No ascites. No free air. VASCULATURE: Abdominal aortic atherosclerosis. Inferior abdominal aorta measures 2.8 cm, within normal limits. Consider follow-up ultrasound in 5 years, if clinically warranted. LYMPH NODES: No lymphadenopathy. REPRODUCTIVE ORGANS: No acute abnormality. BONES AND SOFT TISSUES: Destructive changes involving the parasympathetic region, left greater than right, suggesting osteomyelitis/septic joint. 4.0 x 8.3 cm fluid and gas collection within the midline lower pelvic wall (image 73), similar to the prior, although now with increased gas and decreased fluid. Inferiorly, there is phlegmonous change within the left lower perineum (image 84), difficult to discretely measure but similar to the prior, with trace residual contrast (image 82), improved. IMPRESSION: 1. Thick-walled bladder, decompressed with Foley catheter and indwelling contrast. 2. Pelvic wall fluid and gas collection measuring 4.0 x 8.3 cm, similar in size to prior, with increased gas and decreased fluid. 3. Left lower perineal phlegmon, overall similar with decreased residual contrast. 4. Osteomyelitis/septic joint involving the pubic symphysis, stable. Electronically signed by: Pinkie Pebbles MD 07/28/2024 09:45 PM EDT RP Workstation: [id number]   CT Angio Chest  PE W and/or Wo Contrast Result Date: 07/28/2024 EXAM: CTA of the Chest with contrast for PE 07/28/2024 09:27:51 PM TECHNIQUE: CTA of the chest was performed after the administration of intravenous contrast. Multiplanar reformatted images are provided for review. MIP images are provided for review. Automated exposure control, iterative reconstruction, and/or weight based adjustment of the mA/kV was utilized to reduce the radiation dose to as low as reasonably achievable. COMPARISON: CT of the chest 07/10/2024. CLINICAL HISTORY: PE workup. Patient BIB EMS for code sepsis. Patient reportedly admitted several days ago for leaking bowel. Patient reports he was discharged with PICC line for IV Rocephin  at home. FINDINGS: PULMONARY ARTERIES: Pulmonary arteries are adequately opacified  for evaluation. Segmental right lower lobe pulmonary emboli are present. Segmental pulmonary emboli in the left upper lobe. Main pulmonary artery is normal in caliber. MEDIASTINUM: The heart and pericardium demonstrate no acute abnormality. There are atherosclerotic calcifications of the aorta and coronary arteries. There is no acute abnormality of the thoracic aorta. LYMPH NODES: No mediastinal, hilar or axillary lymphadenopathy. LUNGS AND PLEURA: Patchy ground-glass and interstitial opacities are seen in the bilateral lower lobes. There is some atelectasis in the lingula. No pleural effusion or pneumothorax. UPPER ABDOMEN: Limited images of the upper abdomen are unremarkable. SOFT TISSUES AND BONES: Sclerotic density in the left seventh rib appears unchanged. Sclerotic density in the posterior right eighth rib appears unchanged. There are healed posterior inferior right rib fractures. T11 and T12 compression deformities are unchanged. There is marked retropulsion of fracture fragments at T12, unchanged. Sclerotic densities in T10 and L1 are unchanged. No acute soft tissue abnormality. IMPRESSION: 1. Acute segmental pulmonary emboli in  the right lower lobe and left upper lobe. No evidence for right heart strain. 2. Patchy ground-glass and interstitial opacities in the bilateral lower lobes, which may reflect infection/inflammation or edema. Lingular atelectasis. 3. Atherosclerotic calcifications of the aorta and coronary arteries. 4. Stable sclerotic metastatic osseous lesions. Electronically signed by: Greig Pique MD 07/28/2024 09:40 PM EDT RP Workstation: [id number]   DG Chest Port 1 View if patient is in a treatment room. Result Date: 07/28/2024 EXAM: 1 VIEW XRAY OF THE CHEST 07/28/2024 08:46:00 PM COMPARISON: Compared to May 10, 2024. CLINICAL HISTORY: Suspected Sepsis. Table formatting from the original note was not included.; Suspected Sepsis Suspected Sepsis. Table formatting from the original note was not included.; Suspected Sepsis FINDINGS: LINES, TUBES AND DEVICES: Right upper extremity PICC line is seen within the superior vena cava. LUNGS AND PLEURA: Patchy infiltrate is seen within the left mid lung zone. There is increasing streaky infiltrate within the left lung base, possibly infectious in the acute setting. The right lung is clear, with sacral linear atelectasis of the right lung base. No pneumothorax or pleural effusion. HEART AND MEDIASTINUM: Cardiac size is within normal limits. BONES AND SOFT TISSUES: No acute osseous abnormality. IMPRESSION: 1. Patchy infiltrate in the left mid lung zone and increasing streaky infiltrate in the left lung base, possibly infectious in the acute setting. Follow-up chest radiograph is recommended in 3-4 weeks, following conservative therapy, to document resolution. Electronically signed by: Dorethia Molt MD 07/28/2024 08:55 PM EDT RP Workstation: [id number]     Procedures   Medications Ordered in the Ronald  vancomycin  (VANCOREADY) IVPB 1500 mg/300 mL (has no administration in time range)  heparin  ADULT infusion 100 units/mL (25000 units/250mL) (has no administration in time range)   piperacillin-tazobactam (ZOSYN) IVPB 3.375 g (3.375 g Intravenous New Bag/Given 07/28/24 2142)  iohexol  (OMNIPAQUE ) 350 MG/ML injection 75 mL (75 mLs Intravenous Contrast Given 07/28/24 2128)                                    Medical Decision Making Amount and/or Complexity of Data Reviewed Labs: ordered. Radiology: ordered.  Risk Prescription drug management.     HPI:     Presents because of weakness.  Patient states that he feels like he is has become increasingly weak.  Endorses chills but no documented fevers at home.  Endorses some lower abdominal pain.  No chest pain or shortness of breath.  No pleuritic chest pain.  No  hemoptysis.  Denies any history of DVT or PE.  No blood in stool.  No hematemesis.  Patient still feels like he has some slight abdominal pain in the lower abdomen.  Came in today because ongoing weakness.   Previous medical history reviewed : It was discharged on July 23, 2024.HFrEF 25-30%, Metastatic lesions from prostatic cancer on Xtandi , he is managed currently by Dr. Nieves ,has had radiation proctitis with anemia.  Permanent A-fib.  Patient was admitted this time because intra-abdominal rectus sheath abscess versus infected hematoma.  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  Post aspiration by IR abscess.  Infected rectus sheath hematoma.  Was empirically started on vancomycin  and Zosyn.  Narrowed to Rocephin  given patient was growing pansensitive Proteus.    MDM:   Upon exam, patient tachycardic.  Afebrile.  Positive tachypnea.  EKG reviewed.  Sinus tach.  No STEMI.  No arrhythmia.  Obtain laboratory workup.  Will assess for any count of lactic acid elevation.  Will assess for any, AKI.  Given high risk, will obtain CT of the chest rule out PE.  Will also obtain CT of the chest to rule out any kind of infectious etiology such as hospital-acquired pneumonia given patient's presentation and fatigue.  Will also repeat CT  abdomen to make sure the fluid collection is abdomen is not getting bigger or has changed.  Also reassessment of the osteomyelitis.  W  Reevaluation:   Upon reexamination, patient hemodynamically stable.  Remains A&O x 3 with GCS 15.  Chest x-ray concerning for pneumonia.  Therefore, with recent mission, to start patient on broad-spectrum antibiotics with both vancomycin  as well as Zosyn.  Include Zosyn because is still waiting for CT abdomen to rule out acute intra-abdominal pathology.  Patient has been on ceftriaxone  at home.  CTA come back positive for pneumonia as well as PEs.  No right heart strain.  Explain patient's tachycardia though this is likely multifactorial.  Patient has questionable infected hematoma versus some form of fluid collection in his abdomen.  Do think we need to anticoagulate the patient and will need to watch patient's hemoglobin very closely to make sure he does not drop it acutely.  Will not start patient on heparin  bolus but do start him on heparin  drip.  This will be watched closely.  CT scan of the abdomen.  Showed about the same size of the abdominal wall fluid/gas collection.  Also shows ongoing osteomyelitis.  Placed consult to pharmacy for both heparin  as well as vancomycin .  Patient will need to be admitted in the situation  Holding off on fluids at this time given maps are appropriate.  Patient has a history of HFrEF with 25 to 30% EF.  Therefore, do think fluid resuscitation could do more harm than good in the situation especially with PEs.  Interventions: Vancomycin , zosyn, heparin    EKG Interpreted by Me: Sinus tachycardia    Cardiac Tele Interpreted by Me: Sinus tachycardia    I have independently interpreted the CXR  and CT  images and agree with the radiologist finding   Social Determinant of Health: None    Disposition and Follow Up: Admit   CRITICAL CARE Performed by: Lavonia LOISE Pat   Total critical care time: 50 minutes  Critical  care time was exclusive of separately billable procedures and treating other patients.  Critical care was necessary to treat or prevent imminent or life-threatening deterioration.  Critical care was time spent personally by me on the following activities:  development of treatment plan with patient and/or surrogate as well as nursing, discussions with consultants, evaluation of patient's response to treatment, examination of patient, obtaining history from patient or surrogate, ordering and performing treatments and interventions, ordering and review of laboratory studies, ordering and review of radiographic studies, pulse oximetry and re-evaluation of patient's condition.       Final diagnoses:  Multiple subsegmental pulmonary emboli without acute cor pulmonale (HCC)  Tachycardia  Abdominal wall fluid collections  Chronic multifocal osteomyelitis, unspecified site [facility])    Ronald Discharge Orders     None          Simon Lavonia SAILOR, MD 07/28/24 2252

## 2024-07-29 ENCOUNTER — Encounter (HOSPITAL_COMMUNITY): Payer: Self-pay | Admitting: Internal Medicine

## 2024-07-29 ENCOUNTER — Inpatient Hospital Stay (HOSPITAL_COMMUNITY)

## 2024-07-29 ENCOUNTER — Other Ambulatory Visit: Payer: Self-pay

## 2024-07-29 DIAGNOSIS — C61 Malignant neoplasm of prostate: Secondary | ICD-10-CM

## 2024-07-29 DIAGNOSIS — K651 Peritoneal abscess: Secondary | ICD-10-CM | POA: Diagnosis not present

## 2024-07-29 DIAGNOSIS — Z66 Do not resuscitate: Secondary | ICD-10-CM | POA: Diagnosis not present

## 2024-07-29 DIAGNOSIS — B9681 Helicobacter pylori [H. pylori] as the cause of diseases classified elsewhere: Secondary | ICD-10-CM | POA: Diagnosis not present

## 2024-07-29 DIAGNOSIS — I2694 Multiple subsegmental pulmonary emboli without acute cor pulmonale: Secondary | ICD-10-CM

## 2024-07-29 DIAGNOSIS — Z515 Encounter for palliative care: Secondary | ICD-10-CM | POA: Diagnosis not present

## 2024-07-29 DIAGNOSIS — L02211 Cutaneous abscess of abdominal wall: Secondary | ICD-10-CM

## 2024-07-29 DIAGNOSIS — L8990 Pressure ulcer of unspecified site, unspecified stage: Secondary | ICD-10-CM | POA: Insufficient documentation

## 2024-07-29 DIAGNOSIS — A498 Other bacterial infections of unspecified site: Secondary | ICD-10-CM | POA: Diagnosis not present

## 2024-07-29 DIAGNOSIS — B952 Enterococcus as the cause of diseases classified elsewhere: Secondary | ICD-10-CM | POA: Diagnosis not present

## 2024-07-29 DIAGNOSIS — R6510 Systemic inflammatory response syndrome (SIRS) of non-infectious origin without acute organ dysfunction: Secondary | ICD-10-CM | POA: Diagnosis not present

## 2024-07-29 DIAGNOSIS — I2699 Other pulmonary embolism without acute cor pulmonale: Secondary | ICD-10-CM | POA: Diagnosis present

## 2024-07-29 DIAGNOSIS — I2693 Single subsegmental pulmonary embolism without acute cor pulmonale: Secondary | ICD-10-CM

## 2024-07-29 DIAGNOSIS — M8630 Chronic multifocal osteomyelitis, unspecified site: Secondary | ICD-10-CM | POA: Diagnosis not present

## 2024-07-29 DIAGNOSIS — M8610 Other acute osteomyelitis, unspecified site: Secondary | ICD-10-CM | POA: Diagnosis not present

## 2024-07-29 DIAGNOSIS — M868X8 Other osteomyelitis, other site: Secondary | ICD-10-CM

## 2024-07-29 LAB — ECHOCARDIOGRAM COMPLETE
AR max vel: 3.26 cm2
AV Area VTI: 3.44 cm2
AV Area mean vel: 3.09 cm2
AV Mean grad: 5 mmHg
AV Peak grad: 7 mmHg
Ao pk vel: 1.32 m/s
Area-P 1/2: 3.58 cm2
Height: 71 in
S' Lateral: 4.3 cm
Weight: 2363.33 [oz_av]

## 2024-07-29 LAB — CBC
HCT: 23 % — ABNORMAL LOW (ref 39.0–52.0)
Hemoglobin: 7.3 g/dL — ABNORMAL LOW (ref 13.0–17.0)
MCH: 30.2 pg (ref 26.0–34.0)
MCHC: 31.7 g/dL (ref 30.0–36.0)
MCV: 95 fL (ref 80.0–100.0)
Platelets: 216 K/uL (ref 150–400)
RBC: 2.42 MIL/uL — ABNORMAL LOW (ref 4.22–5.81)
RDW: 21.2 % — ABNORMAL HIGH (ref 11.5–15.5)
WBC: 6.5 K/uL (ref 4.0–10.5)
nRBC: 0 % (ref 0.0–0.2)

## 2024-07-29 LAB — TRANSFERRIN: Transferrin: 144 mg/dL — ABNORMAL LOW (ref 180–329)

## 2024-07-29 LAB — SEDIMENTATION RATE: Sed Rate: 113 mm/h — ABNORMAL HIGH (ref 0–16)

## 2024-07-29 LAB — RESP PANEL BY RT-PCR (RSV, FLU A&B, COVID)  RVPGX2
Influenza A by PCR: NEGATIVE
Influenza B by PCR: NEGATIVE
Resp Syncytial Virus by PCR: NEGATIVE
SARS Coronavirus 2 by RT PCR: NEGATIVE

## 2024-07-29 LAB — LACTIC ACID, PLASMA: Lactic Acid, Venous: 1 mmol/L (ref 0.5–1.9)

## 2024-07-29 LAB — IRON AND TIBC
Iron: 13 ug/dL — ABNORMAL LOW (ref 45–182)
Saturation Ratios: 6 % — ABNORMAL LOW (ref 17.9–39.5)
TIBC: 210 ug/dL — ABNORMAL LOW (ref 250–450)
UIBC: 197 ug/dL

## 2024-07-29 LAB — URINE CULTURE: Culture: NO GROWTH

## 2024-07-29 LAB — TYPE AND SCREEN
ABO/RH(D): A POS
Antibody Screen: NEGATIVE

## 2024-07-29 LAB — C-REACTIVE PROTEIN: CRP: 11.3 mg/dL — ABNORMAL HIGH (ref <1.0)

## 2024-07-29 LAB — HEPARIN LEVEL (UNFRACTIONATED)
Heparin Unfractionated: <0.1 [IU]/mL — ABNORMAL LOW (ref 0.30–0.70)
Heparin Unfractionated: <0.1 [IU]/mL — ABNORMAL LOW (ref 0.30–0.70)

## 2024-07-29 LAB — BRAIN NATRIURETIC PEPTIDE: B Natriuretic Peptide: 1226.8 pg/mL — ABNORMAL HIGH (ref 0.0–100.0)

## 2024-07-29 LAB — FERRITIN: Ferritin: 430 ng/mL — ABNORMAL HIGH (ref 24–336)

## 2024-07-29 LAB — POC OCCULT BLOOD, ED: Fecal Occult Bld: NEGATIVE

## 2024-07-29 LAB — TROPONIN I (HIGH SENSITIVITY): Troponin I (High Sensitivity): 54 ng/L — ABNORMAL HIGH (ref <18)

## 2024-07-29 MED ORDER — MIDODRINE HCL 5 MG PO TABS
10.0000 mg | ORAL_TABLET | Freq: Three times a day (TID) | ORAL | Status: DC
  Administered 2024-07-29 – 2024-08-02 (×14): 10 mg via ORAL
  Filled 2024-07-29 (×14): qty 2

## 2024-07-29 MED ORDER — COLLAGENASE 250 UNIT/GM EX OINT
TOPICAL_OINTMENT | Freq: Every day | CUTANEOUS | Status: DC
  Filled 2024-07-29 (×2): qty 30

## 2024-07-29 MED ORDER — GERHARDT'S BUTT CREAM
TOPICAL_CREAM | Freq: Two times a day (BID) | CUTANEOUS | Status: DC
  Administered 2024-07-29: 1 via TOPICAL
  Filled 2024-07-29: qty 60

## 2024-07-29 MED ORDER — PERFLUTREN LIPID MICROSPHERE
1.0000 mL | INTRAVENOUS | Status: AC | PRN
  Administered 2024-07-29: 2 mL via INTRAVENOUS

## 2024-07-29 MED ORDER — MIDODRINE HCL 5 MG PO TABS: 10.0000 mg | ORAL_TABLET | Freq: Once | ORAL | Status: DC | PRN

## 2024-07-29 NOTE — Consult Note (Signed)
 Consultation Note Date: 07/29/2024   Patient Name: Ronald Kaiser  DOB: [birth year]  MRN: [id number]  Age / Sex: 89 y.o., male  PCP: [name], [name] Referring Physician: Darci Pore, MD  Reason for Consultation: Establishing goals of care  HPI/Patient Profile: 89 y.o. male   admitted on 07/28/2024 with  hx of metastatic prostate cancer, hx TURP, on ADT, hx radiation therapy c/b radiation proctitis, and recent complex hospitalization 10/8-21 with pubic symphysis osteomyelitis, as well as fistulous tract between the bladder and the lower anterior abdominal wall abscess, with Ucx and aspiration of the abd wall abscess by IR both + Proteus Mirabilis, felt to not be a surgical candidate, PICC line placed and was discharged with plan for 6 weeks of CTX and possible suppressive abx thereafter; additional hx includes CAD, HFrEF, Afib not on AC, orthostatic hypotension, anemia presented with progressive weakness, and per his report home RN concern for worsening wound on his back.    In the emergency department, patient had CT chest/abdomen/pelvis which showed acute pulmonary emboli in right lower lobe and left upper lobe, no heart strain, patchy ground glass and interstitial opacities in bilateral lower lobes possible infection versus atelectasis, stable sclerotic metastatic osseous lesions, osteomyelitis/septic joint involving pubic symphysis which is stable.   Patient faced with ongoing treatment option decisions, advanced directive decisions and anticipatory care needs.   Clinical Assessment and Goals of Care:  This NP [name] reviewed medical records, received report from team, assessed the patient and then meet at the patient's bedside/in ER to discuss diagnosis, prognosis, GOC,  and options.   Concept of Palliative Care was introduced as specialized medical care for people and their families  living with serious illness.  If focuses on providing relief from the symptoms and stress of a serious illness.  The goal is to improve quality of life for both the patient and the family. Values and goals of care important to patient and family were attempted to be elicited.  Patient is familiar with the palliative medicine team, goals of care meeting had on last admission on 07/19/2024    Created space and opportunity for patient to explore thoughts and feelings regarding current medical situation.  Patient lives at home with his wife of 18 years.  Wife has her own health issues, macular degeneration and decreased mobility, she is unable to drive.   Mr. Fell reports that he was doing well at home, on home antibiotic therapy, however increasing progressive weakness and pain on ambulation brought him back to the hospital.   Education offered on the seriousness of his complex medical situation.  We discussed concept of adult failure to thrive within the context of serious chronic illnesses   He verbalizes understanding, he remains open to all offered and available medical interventions to prolong life.  He is hopeful for improvement.   A  discussion was had today regarding advanced directives.  Concepts specific to code status, artifical feeding and hydration, continued IV antibiotics and rehospitalization was had.     ---  Currently DNR DNI    ---Verbalizes no desire for artificial feeding now or in the future  The difference between a aggressive medical intervention path  and a palliative comfort care path, allowing for natural death was discussed.    Questions and concerns addressed.  Patient  encouraged to call with questions or concerns.     PMT will continue to support holistically.          Patient tells me he does have documented H POA and advance care planning documents at home and is safe.  I encouraged him to have his family bring them in for scanning.  In the event that he  could not speak for himself, his wife / next of kin, Ronald Kaiser would speak for him       SUMMARY OF RECOMMENDATIONS    Code Status/Advance Care Planning: Limited code No artificial feeding now or in the future   Symptom Management:  Per attending  Palliative Prophylaxis:  Bowel Regimen, Delirium Protocol, and Frequent Pain Assessment  Additional Recommendations (Limitations, Scope, Preferences): Full Scope Treatment  Psycho-social/Spiritual:  Desire for further Chaplaincy support:no Additional Recommendations: Emotional support offered  Prognosis:  Unable to determine  Discharge Planning: Patient is open to SNF for short-term rehab when medically stable if eligible   To Be Determined      Primary Diagnoses: Present on Admission:  Pulmonary embolism (HCC)  Acute pulmonary embolism (HCC)   I have reviewed the medical record, interviewed the patient and family, and examined the patient. The following aspects are pertinent.  Past Medical History:  Diagnosis Date   AKI (acute kidney injury) 11/12/2018   CAD (coronary artery disease)    CHF (congestive heart failure) (HCC)    Dyspnea    Foley catheter in place    HTN (hypertension)    Hyperlipidemia    Myocardial infarction (HCC) 2000   REPORTS HE HAS HAD 5 HEART ATTACKS BUT SOME OF THE WERE NOT SERIOUS    Obesity    Prostate cancer (HCC)    Social History   Socioeconomic History   Marital status: Married    Spouse name: Not on file   Number of children: Not on file   Years of education: Not on file   Highest education level: Not on file  Occupational History    Comment: retired  Tobacco Use   Smoking status: Former    Current packs/day: 0.00    Average packs/day: 0.5 packs/day for 7.0 years (3.5 ttl pk-yrs)    Types: Cigarettes    Start date: 10/04/1955    Quit date: 10/03/1962    Years since quitting: 61.8   Smokeless tobacco: Never  Vaping Use   Vaping status: Never Used  Substance and  Sexual Activity   Alcohol use: Yes    Comment: [facility] [employer]    Drug use: Not Currently   Sexual activity: Not Currently  Other Topics Concern   Not on file  Social History Narrative   Moved from [facility] to [place], [place] following divorce to be closer to his brother. Patient has remarried.   Social Drivers of Corporate Investment Banker Strain: Not on file  Food Insecurity: Patient Declined (07/12/2024)   Hunger Vital Sign    Worried About Running Out of Food in the Last Year: Patient declined    Ran Out of Food in the Last Year: Patient declined  Transportation Needs: Patient Declined (07/12/2024)   PRAPARE - Administrator, Civil Service (  Medical): Patient declined    Lack of Transportation (Non-Medical): Patient declined  Physical Activity: Not on file  Stress: Not on file  Social Connections: Patient Declined (07/12/2024)   Social Connection and Isolation Panel    Frequency of Communication with Friends and Family: Patient declined    Frequency of Social Gatherings with Friends and Family: Patient declined    Attends Religious Services: Patient declined    Database Administrator or Organizations: Patient declined    Attends Banker Meetings: Patient declined    Marital Status: Patient declined  Recent Concern: Social Connections - Moderately Isolated (06/07/2024)   Social Connection and Isolation Panel    Frequency of Communication with Friends and Family: More than three times a week    Frequency of Social Gatherings with Friends and Family: More than three times a week    Attends Religious Services: Never    Database Administrator or Organizations: No    Attends Engineer, Structural: Never    Marital Status: Married   Family History  Problem Relation Age of Onset   Hypertension Father    Breast cancer Neg Hx    Prostate cancer Neg Hx    Colon cancer Neg Hx    Pancreatic cancer Neg Hx    Scheduled Meds:  collagenase   Topical Daily    feeding supplement  237 mL Oral TID BM   ferrous sulfate   325 mg Oral Q breakfast   Gerhardt's butt cream   Topical BID   midodrine   10 mg Oral TID WC   multivitamin with minerals  1 tablet Oral Daily   pantoprazole   40 mg Oral Daily   pravastatin   20 mg Oral QPM   sodium chloride  flush  3 mL Intravenous Q12H   sucralfate  2 g Rectal Daily   Continuous Infusions:  heparin  1,150 Units/hr (07/29/24 1055)   piperacillin-tazobactam (ZOSYN)  IV Stopped (07/29/24 0942)   vancomycin  Stopped (07/29/24 0238)   PRN Meds:.acetaminophen , albuterol, melatonin, ondansetron  (ZOFRAN ) IV, polyethylene glycol Medications Prior to Admission:  Prior to Admission medications   Medication Sig Start Date End Date Taking? Authorizing Provider  Calcium  Carb-Cholecalciferol (CALCIUM  + VITAMIN D3 PO) Take 1 tablet by mouth daily.   Yes [provider]  cefTRIAXone  (ROCEPHIN ) IVPB Inject 2 g into the vein daily. Indication:  Intra-abdominal abscess First Dose: Yes Last Day of Therapy:  08/23/24 Labs - Once weekly:  CBC/D and BMP, Labs - Once weekly: ESR and CRP Method of administration: IV Push or per SNF protocol (i.e IVPB) Method of administration may be changed at the discretion of home infusion pharmacist based upon assessment of the patient and/or caregiver's ability to self-administer the medication ordered. 07/23/24 08/27/24 Yes Elgergawy, Brayton RAMAN, MD  feeding supplement (ENSURE PLUS HIGH PROTEIN) LIQD Take 237 mLs by mouth 3 (three) times daily between meals. 07/23/24  Yes Elgergawy, Brayton RAMAN, MD  ferrous sulfate  325 (65 FE) MG EC tablet Take 325 mg by mouth daily with breakfast.   Yes [provider]  midodrine  (PROAMATINE ) 10 MG tablet Take 1 tablet (10 mg total) by mouth 3 (three) times daily with meals. 07/23/24  Yes Elgergawy, Brayton RAMAN, MD  Multiple Vitamins-Minerals (CENTRUM SILVER MEN 50+) TABS Take 1 tablet by mouth daily.   Yes [provider]  pantoprazole   (PROTONIX ) 40 MG tablet Take 1 tablet (40 mg total) by mouth daily. 07/23/24  Yes Elgergawy, Brayton RAMAN, MD  pentoxifylline (TRENTAL) 400 MG CR tablet  Take 1 tablet (400 mg total) by mouth 3 (three) times daily with meals. 07/23/24  Yes Elgergawy, Brayton RAMAN, MD  pravastatin  (PRAVACHOL ) 20 MG tablet Take 1 tablet (20 mg total) by mouth every evening. 08/01/23  Yes Pietro Redell RAMAN, MD  vitamin E 180 MG (400 UNITS) capsule Take 2 capsules (800 Units total) by mouth daily. 07/23/24  Yes Elgergawy, Brayton RAMAN, MD  acetaminophen  (TYLENOL ) 325 MG tablet Take 2 tablets (650 mg total) by mouth every 6 (six) hours as needed for mild pain (pain score 1-3) or fever (or Fever >/= 101). Patient not taking: Reported on 07/28/2024 07/23/24   Elgergawy, Brayton RAMAN, MD  enzalutamide  (XTANDI ) 80 MG tablet Take 2 tablets (160 mg total) by mouth daily. Resume home meds Patient not taking: Reported on 07/28/2024 07/23/24   Elgergawy, Brayton RAMAN, MD  nitroGLYCERIN  (NITROSTAT ) 0.4 MG SL tablet Place 1 tablet (0.4 mg total) under the tongue every 5 (five) minutes as needed. Patient not taking: Reported on 07/28/2024 04/17/17   Pietro Redell RAMAN, MD  relugolix (ORGOVYX) 120 MG tablet Take 1 tablet (120 mg total) by mouth daily. Resume home meds Patient not taking: Reported on 07/28/2024 07/23/24   Elgergawy, Brayton RAMAN, MD   Allergies  Allergen Reactions   Bactrim [Sulfamethoxazole-Trimethoprim] Hives, Shortness Of Breath and Swelling   Augmentin [Amoxicillin-Pot Clavulanate] Diarrhea   Crestor  [Rosuvastatin  Calcium ] Other (See Comments)    Back pain Grogginess  Dizziness    Zetia  [Ezetimibe ] Other (See Comments)    Back pain Grogginess  Dizziness    Review of Systems  Physical Exam  Vital Signs: BP 104/61   Pulse 87   Temp 97.7 F (36.5 C) (Axillary)   Resp (!) 24   Ht 5' 11 (1.803 m)   Wt 67 kg   SpO2 98%   BMI 20.60 kg/m  Pain Scale: 0-10   Pain Score: 0-No pain   SpO2: SpO2: 98 % O2 Device:SpO2: 98  % O2 Flow Rate: .O2 Flow Rate (L/min): 3 L/min  IO: Intake/output summary:  Intake/Output Summary (Last 24 hours) at 07/29/2024 1236 Last data filed at 07/29/2024 1055 Gross per 24 hour  Intake 1403.17 ml  Output 380 ml  Net 1023.17 ml    LBM:   Baseline Weight: Weight: 67 kg Most recent weight: Weight: 67 kg     Palliative Assessment/Data:     Time : 75 minutes  Signed by: [name], NP   Please contact Palliative Medicine Team phone at [phone] for questions and concerns.  For individual provider: See Tracey

## 2024-07-29 NOTE — ED Notes (Signed)
Echo as bedside.

## 2024-07-29 NOTE — Progress Notes (Signed)
 PHARMACY - ANTICOAGULATION CONSULT NOTE  Pharmacy Consult for heparin  Indication: pulmonary embolus  Allergies  Allergen Reactions   Bactrim [Sulfamethoxazole-Trimethoprim] Hives, Shortness Of Breath and Swelling   Augmentin [Amoxicillin-Pot Clavulanate] Diarrhea   Crestor  [Rosuvastatin  Calcium ] Other (See Comments)    Back pain Grogginess  Dizziness    Zetia  [Ezetimibe ] Other (See Comments)    Back pain Grogginess  Dizziness     Patient Measurements: Height: 5' 11 (180.3 cm) Weight: 67 kg (147 lb 11.3 oz) IBW/kg (Calculated) : 75.3 HEPARIN  DW (KG): 67  Vital Signs: Temp: 98 F (36.7 C) (10/27 0054) Temp Source: Oral (10/27 0054) BP: 95/47 (10/27 0745) Pulse Rate: 73 (10/27 0745)  Labs: Recent Labs    07/28/24 2021 07/28/24 2330 07/29/24 0259 07/29/24 0728  HGB 8.5*  --  7.3*  --   HCT 26.8*  --  23.0*  --   PLT 262  --  216  --   LABPROT 15.5*  --   --   --   INR 1.2  --   --   --   HEPARINUNFRC  --   --   --  <0.10*  CREATININE 0.79  --   --   --   TROPONINIHS  --  54*  --   --     Estimated Creatinine Clearance: 60.5 mL/min (by C-G formula based on SCr of 0.79 mg/dL).   Medical History: Past Medical History:  Diagnosis Date   AKI (acute kidney injury) 11/12/2018   CAD (coronary artery disease)    CHF (congestive heart failure) (HCC)    Dyspnea    Foley catheter in place    HTN (hypertension)    Hyperlipidemia    Myocardial infarction (HCC) 2000   REPORTS HE HAS HAD 5 HEART ATTACKS BUT SOME OF THE WERE NOT SERIOUS    Obesity    Prostate cancer (HCC)       Assessment: 21 yoM with PE on chest CT. Pt with recent infected rectus sheath hematoma so MD consulted pharmacy to dose IV heparin  with no bolus. No AC PTA. CBC low but stable.  Initial heparin  level undetectable on 1000 units/hr, no line issues or bleeding per RN  Goal of Therapy:  Heparin  level 0.3-0.7 units/ml Monitor platelets by anticoagulation protocol: Yes   Plan:  Increase  heparin  gtt to 1150 units/hr F/u 8 hour heparin  level F/u long term AC plan  Dorn Poot, PharmD, [facility] Clinical Pharmacist ED Pharmacist Phone # [phone] 07/29/2024 8:12 AM

## 2024-07-29 NOTE — Consult Note (Signed)
 Date of Admission:  07/28/2024          Reason for Consult: Possible aortic valve endocarditis    Referring Provider: Darci Pore, MD   Assessment:  Possible aortic valve endocarditis but not a candidate for TEE Abdominal wall abscess with fistulous tract between the bladder and the lower abdominal wall abscess status post aspiration with Proteus mirabilis and Aerococcus isolated--unchanged after drainage and IV abx Osteomyelitis of the symphysis pubis Metastatic prostate cancer Newly diagnosed pulmonary embolism Suspected PICC line infection  Plan:  DC PICC Line Fine to continue zosyn but DC vancomycin  Follow-up blood cultures Palliative care engaged Could ask IR to aspirate abscess to drain it again and get fresh cultures I would strongly favor of it to pure palliative care with comfort being the goal rather than aggressive interventions at this point  Principal Problem:   Pulmonary embolism (HCC) Active Problems:   Acute pulmonary embolism (HCC)   Pressure injury of skin   Scheduled Meds:  collagenase   Topical Daily   feeding supplement  237 mL Oral TID BM   ferrous sulfate   325 mg Oral Q breakfast   Gerhardt's butt cream   Topical BID   midodrine   10 mg Oral TID WC   multivitamin with minerals  1 tablet Oral Daily   pantoprazole   40 mg Oral Daily   pravastatin   20 mg Oral QPM   sodium chloride  flush  3 mL Intravenous Q12H   sucralfate  2 g Rectal Daily   Continuous Infusions:  heparin  1,150 Units/hr (07/29/24 1055)   piperacillin-tazobactam (ZOSYN)  IV 3.375 g (07/29/24 1405)   vancomycin  Stopped (07/29/24 0238)   PRN Meds:.acetaminophen , albuterol, melatonin, ondansetron  (ZOFRAN ) IV, polyethylene glycol  HPI: Ronald Kaiser is a 89 y.o. male history of CAD, AF, metastatic prostate cancer o, followed by Dr. Nieves, on Ogrovyx and XTandi , course complicated by radiation proctitis and then admission for septic shock thought to be due to  possible UTI with a left rectal wall hematoma he then presented with lower abdominal pain.  Showed an abdominal wall abscess and pelvic osteomyelitis with a fistulous tract between the bladder and the abdominal wall abscess.  Is not felt to be operative candidate and IR placed a drain in the abscess with cultures yielding   Proteus mirabilis and Aerococcus species.  Blood cultures were negative.   He is placed on ceftriaxone  with plans to complete a 6-week course to treat the abscess as well as his osteomyelitis with follow-up.  He was discharged to home and while at home noticed that nearly every single time he infuses antibiotics he developed rigors chills malaise and weakness.  This would then pass and then recur the following day.  He reached out to home health who suggested the problem was due to drainage from a pressure ulcer on his backside.  In the ER he had a CT angiogram that showed a segmental pulmonary embolism without right heart strain and question of pneumonia.  CT abdomen pelvis showed thick-walled bladder decompressed with Foley with a pelvic wall abscess and gas collection measuring 4 x 8.3 cm that did not really change in size compared to imaging when he was last hospitalized with left lower perineal phlegmon with similar in appearance and osteomyelitis and septic arthritis of the pubic symphysis that was stable.  He has then since had a 2D echocardiogram which shows moderate thickening of the noncoronary cusp of the aortic valve with independently mobile  echodensities attached to this concerning for infectious endocarditis versus degenerative changes or I would suppose potentially marantic endocarditis.  Cultures were taken and he was placed on vancomycin  and Zosyn.  I will get rid of the vancomycin  if it is not already been discontinued.  I think his PICC line sounds infected based on his symptoms every time he infuses the line.  Would continue Zosyn for now and  follow-up culture data and blood palliative care are engaged.  Cardiology do not feel he is appropriate for TEE and I agree.  Would strongly support a pivot to pure palliative care and comfort care in this patient.  He told me himself that he was coming to the end of his days and that there is just too much going on.  I have personally spent 86 minutes involved in face-to-face and non-face-to-face activities for this patient on the day of the visit. Professional time spent includes the following activities: Preparing to see the patient (review of tests), Obtaining and/or reviewing separately obtained history (admission/discharge record), Performing a medically appropriate examination and/or evaluation , Ordering medications/tests/procedures, referring and communicating with other health care professionals, Documenting clinical information in the EMR, Independently interpreting results (not separately reported), Communicating results to the patient/family/caregiver, Counseling and educating the patient/family/caregiver and Care coordination (not separately reported).   Evaluation of the patient requires complex antimicrobial therapy evaluation, counseling , isolation needs to reduce disease transmission and risk assessment and mitigation.     Review of Systems: Review of Systems  Constitutional:  Positive for chills, fever and malaise/fatigue. Negative for weight loss.  HENT:  Negative for congestion and sore throat.   Eyes:  Negative for blurred vision and photophobia.  Respiratory:  Negative for cough, shortness of breath and wheezing.   Cardiovascular:  Negative for chest pain, palpitations and leg swelling.  Gastrointestinal:  Negative for abdominal pain, blood in stool, constipation, diarrhea, heartburn, melena, nausea and vomiting.  Genitourinary:  Negative for dysuria, flank pain and hematuria.  Musculoskeletal:  Negative for back pain, falls, joint pain and myalgias.  Skin:  Negative  for itching and rash.  Neurological:  Positive for weakness. Negative for dizziness, focal weakness, loss of consciousness and headaches.  Endo/Heme/Allergies:  Does not bruise/bleed easily.  Psychiatric/Behavioral:  Positive for depression. Negative for suicidal ideas. The patient does not have insomnia.     Past Medical History:  Diagnosis Date   AKI (acute kidney injury) 11/12/2018   CAD (coronary artery disease)    CHF (congestive heart failure) (HCC)    Dyspnea    Foley catheter in place    HTN (hypertension)    Hyperlipidemia    Myocardial infarction (HCC) 2000   REPORTS HE HAS HAD 5 HEART ATTACKS BUT SOME OF THE WERE NOT SERIOUS    Obesity    Prostate cancer (HCC)     Social History   Tobacco Use   Smoking status: Former    Current packs/day: 0.00    Average packs/day: 0.5 packs/day for 7.0 years (3.5 ttl pk-yrs)    Types: Cigarettes    Start date: 10/04/1955    Quit date: 10/03/1962    Years since quitting: 61.8   Smokeless tobacco: Never  Vaping Use   Vaping status: Never Used  Substance Use Topics   Alcohol use: Yes    Comment: [facility] [employer]    Drug use: Not Currently    Family History  Problem Relation Age of Onset   Hypertension Father    Breast cancer  Neg Hx    Prostate cancer Neg Hx    Colon cancer Neg Hx    Pancreatic cancer Neg Hx    Allergies  Allergen Reactions   Bactrim [Sulfamethoxazole-Trimethoprim] Hives, Shortness Of Breath and Swelling   Augmentin [Amoxicillin-Pot Clavulanate] Diarrhea   Crestor  [Rosuvastatin  Calcium ] Other (See Comments)    Back pain Grogginess  Dizziness    Zetia  [Ezetimibe ] Other (See Comments)    Back pain Grogginess  Dizziness     OBJECTIVE: Blood pressure (!) 104/51, pulse 77, temperature 98.1 F (36.7 C), resp. rate 17, height 5' 11 (1.803 m), weight 67 kg, SpO2 99%.  Physical Exam Constitutional:      Appearance: He is well-developed.  HENT:     Head: Normocephalic and atraumatic.  Eyes:      Conjunctiva/sclera: Conjunctivae normal.  Cardiovascular:     Rate and Rhythm: Normal rate and regular rhythm.     Heart sounds: No murmur heard.    No gallop.  Pulmonary:     Effort: Pulmonary effort is normal. No respiratory distress.     Breath sounds: No stridor. No wheezing or rhonchi.  Abdominal:     General: There is no distension.     Palpations: Abdomen is soft. There is no mass.  Musculoskeletal:        General: No tenderness. Normal range of motion.     Cervical back: Normal range of motion and neck supple.  Skin:    General: Skin is warm and dry.     Coloration: Skin is not pale.     Findings: No erythema or rash.  Neurological:     General: No focal deficit present.     Mental Status: He is alert and oriented to person, place, and time.  Psychiatric:        Mood and Affect: Mood normal.        Behavior: Behavior normal.        Thought Content: Thought content normal.        Judgment: Judgment normal.     Lab Results Lab Results  Component Value Date   WBC 6.5 07/29/2024   HGB 7.3 (L) 07/29/2024   HCT 23.0 (L) 07/29/2024   MCV 95.0 07/29/2024   PLT 216 07/29/2024    Lab Results  Component Value Date   CREATININE 0.79 07/28/2024   BUN 31 (H) 07/28/2024   NA 132 (L) 07/28/2024   K 4.1 07/28/2024   CL 94 (L) 07/28/2024   CO2 23 07/28/2024    Lab Results  Component Value Date   ALT 14 07/28/2024   AST 21 07/28/2024   ALKPHOS 58 07/28/2024   BILITOT 0.2 07/28/2024     Microbiology: Recent Results (from the past 240 hours)  Culture, blood (Routine x 2)     Status: None (Preliminary result)   Collection Time: 07/28/24  3:10 AM   Specimen: BLOOD LEFT FOREARM  Result Value Ref Range Status   Specimen Description BLOOD LEFT FOREARM  Final   Special Requests BOTTLES DRAWN AEROBIC AND ANAEROBIC  Final   Culture   Final    NO GROWTH < 12 HOURS Performed at [facility], [address], [place], [place] [address]    Report Status PENDING   Incomplete  Culture, blood (Routine x 2)     Status: None (Preliminary result)   Collection Time: 07/28/24  8:21 PM   Specimen: BLOOD  Result Value Ref Range Status   Specimen Description BLOOD SITE NOT SPECIFIED  Final   Special Requests   Final    BOTTLES DRAWN AEROBIC AND ANAEROBIC Blood Culture adequate volume   Culture   Final    NO GROWTH < 12 HOURS Performed at [facility], [address], [place], [place] [address]    Report Status PENDING  Incomplete  Urine Culture     Status: None   Collection Time: 07/28/24  8:21 PM   Specimen: Urine, Random  Result Value Ref Range Status   Specimen Description URINE, RANDOM  Final   Special Requests NONE Reflexed from [id number]  Final   Culture   Final    NO GROWTH Performed at [facility], [address] [place], [place] [address]    Report Status 07/29/2024 FINAL  Final  Resp panel by RT-PCR (RSV, Flu A&B, Covid) Anterior Nasal Swab     Status: None   Collection Time: 07/28/24 11:30 PM   Specimen: Anterior Nasal Swab  Result Value Ref Range Status   SARS Coronavirus 2 by RT PCR NEGATIVE NEGATIVE Final   Influenza A by PCR NEGATIVE NEGATIVE Final   Influenza B by PCR NEGATIVE NEGATIVE Final    Comment: (NOTE) The Xpert Xpress SARS-CoV-2/FLU/RSV plus assay is intended as an aid in the diagnosis of influenza from Nasopharyngeal swab specimens and should not be used as a sole basis for treatment. Nasal washings and aspirates are unacceptable for Xpert Xpress SARS-CoV-2/FLU/RSV testing.  Fact Sheet for Patients: bloggercourse.com  Fact Sheet for Healthcare Providers: seriousbroker.it  This test is not yet approved or cleared by the [place]  FDA and has been authorized for detection and/or diagnosis of SARS-CoV-2 by FDA under an Emergency Use Authorization (EUA). This EUA will remain in effect (meaning this test can be used) for the duration of the COVID-19  declaration under Section 564(b)(1) of the Act, 21 U.S.C. section 360bbb-3(b)(1), unless the authorization is terminated or revoked.     Resp Syncytial Virus by PCR NEGATIVE NEGATIVE Final    Comment: (NOTE) Fact Sheet for Patients: bloggercourse.com  Fact Sheet for Healthcare Providers: seriousbroker.it  This test is not yet approved or cleared by the [place]  FDA and has been authorized for detection and/or diagnosis of SARS-CoV-2 by FDA under an Emergency Use Authorization (EUA). This EUA will remain in effect (meaning this test can be used) for the duration of the COVID-19 declaration under Section 564(b)(1) of the Act, 21 U.S.C. section 360bbb-3(b)(1), unless the authorization is terminated or revoked.  Performed at [facility], [address], [place], [place] [address]     Jomarie Fleeta Rothman, MD [facility] [phone] pager  07/29/2024, 4:45 PM

## 2024-07-29 NOTE — Progress Notes (Signed)
 PHARMACY - ANTICOAGULATION CONSULT NOTE  Pharmacy Consult for heparin  Indication: pulmonary embolus  Allergies  Allergen Reactions   Bactrim [Sulfamethoxazole-Trimethoprim] Hives, Shortness Of Breath and Swelling   Augmentin [Amoxicillin-Pot Clavulanate] Diarrhea   Crestor  [Rosuvastatin  Calcium ] Other (See Comments)    Back pain Grogginess  Dizziness    Zetia  [Ezetimibe ] Other (See Comments)    Back pain Grogginess  Dizziness     Patient Measurements: Height: 5' 11 (180.3 cm) Weight: 67 kg (147 lb 11.3 oz) IBW/kg (Calculated) : 75.3 HEPARIN  DW (KG): 67  Vital Signs: Temp: 97.7 F (36.5 C) (10/27 2010) Temp Source: Oral (10/27 2010) BP: 112/50 (10/27 2010) Pulse Rate: 73 (10/27 2010)  Labs: Recent Labs    07/28/24 2021 07/28/24 2330 07/29/24 0259 07/29/24 0728 07/29/24 2018  HGB 8.5*  --  7.3*  --   --   HCT 26.8*  --  23.0*  --   --   PLT 262  --  216  --   --   LABPROT 15.5*  --   --   --   --   INR 1.2  --   --   --   --   HEPARINUNFRC  --   --   --  <0.10* <0.10*  CREATININE 0.79  --   --   --   --   TROPONINIHS  --  54*  --   --   --     Estimated Creatinine Clearance: 60.5 mL/min (by C-G formula based on SCr of 0.79 mg/dL).  Assessment: 89 yoM with PE on chest CT. Pt with recent infected rectus sheath hematoma so MD consulted pharmacy to dose IV heparin  with no bolus. No AC PTA. CBC low but stable.  Heparin  level remains undetectable on 1150 units/hr. No issues with line or bleeding reported per RN.  Goal of Therapy:  Heparin  level 0.3-0.7 units/ml Monitor platelets by anticoagulation protocol: Yes   Plan:  Increase heparin  gtt to 1400 units/hr F/u 8 hour heparin  level  Vito Ralph, PharmD, BCPS Please see amion for complete clinical pharmacist phone list 07/29/2024 9:14 PM

## 2024-07-29 NOTE — ED Notes (Signed)
 Pt eating breakfast. Asked pt to check for GI bleeding, said we have to wait until he finishes breakfast. Will check after pt finishes eating.

## 2024-07-29 NOTE — Progress Notes (Signed)
 Cardiology asked to consent patient for TEE due to concerns of vegetation on aortic valve.  He has multiple comorbidities here with PE and concerns of sepsis.  Chart reviewed.  Overall has very poor prognosis and TEE not likely to change management as he is not a candidate for valve repair.  Additionally high risk for TEE.  Would treat prophylactically if there is concerns for endocarditis.  Conservative approach likely best given he is DNR/DNI.  Discussed with patient and family who are agreeable.  Cardiologist will review case and provide further recommendations if indicated.

## 2024-07-29 NOTE — Progress Notes (Signed)
 Progress Note   Patient: Ronald Kaiser FMW:[id number] DOB: 02/28/1934 DOA: 07/28/2024     1 DOS: the patient was seen and examined on 07/29/2024   Brief hospital course: Ronald Kaiser is a 89 y.o. male with hx of metastatic prostate cancer, hx TURP, on ADT, hx radiation therapy c/b radiation proctitis, and recent complex hospitalization 10/8-21 with pubic symphysis osteomyelitis, as well as fistulous tract between the bladder and the lower anterior abdominal wall abscess, with Ucx and aspiration of the abd wall abscess by IR both + Proteus Mirabilis, felt to not be a surgical candidate, PICC line placed and was discharged with plan for 6 weeks of CTX and possible suppressive abx thereafter; additional hx includes CAD, HFrEF, Afib not on AC, orthostatic hypotension, anemia presented with progressive weakness, and per his report home RN concern for worsening wound on his back.   In the emergency department, patient had CT chest/abdomen/pelvis which showed acute pulmonary emboli in right lower lobe and left upper lobe, no heart strain, patchy ground glass and interstitial opacities in bilateral lower lobes possible infection versus atelectasis, stable sclerotic metastatic osseous lesions, osteomyelitis/septic joint involving pubic symphysis which is stable.  Patient is already on heparin  drip, vancomycin  and Zosyn, admitted to [facility] service for further management evaluation.  Assessment and Plan: Segmental pulmonary embolism RLL, LUL, without RV strain Likely provoked in the setting of recent sedentary behavior and an underlying malignancy.  No chest pain or shortness of breath, has symmetric lower extremity edema.  BNP / trop elevated but stable. EKG with old RBBB.  Case is complicated especially with hx of recent hx GI bleeding related to radiation proctitis and high risk of recurrent bleeding. May benefit from short term anticoagulation, although if rebleeding develops then likely will need to stop  anticoagulation.  I did discuss with him for understand risk of bleeding. Continue heparin  drip as per pharmacy protocol. Palliative team consulted for goals of care discussion.  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 See hx per above / last Dc summary, Abd wall and Ucx + proteus, has PICC and on CTX PTA. UA remains consistent with infection.  CT abdomen pelvis with thick-walled bladder decompressed, similar size of pelvic wall collection 4 x 8.3 cm increased gas, decreased fluid.  Left lower perineal phlegmon similar and decreased residual contrast, stable pubic symphysis osteomyelitis. Lactic acid, WBC normal.  Follow-up blood cultures, urine cultures Continue broad-spectrum antibiotics vancomycin  and Zosyn in setting of possible ongoing urinary infection, possible pneumonia, endocarditis.  Pending culture data anticipate return back to ceftriaxone  for coverage of Proteus and course per prior ID rec x 6 weeks. ID evaluation called as echocardiogram shows possible aortic vegetation.  TEE scheduled for Wednesday.   Hyponatremia, mild  Seems chronic. Sodium stable.   Pressure ulcer mid back  Continue dressing changes as per wound care.   Deconditioning PT OT evaluation for discharge planning  Goals of care Overall very ill gentleman with metastatic prostate cancer and associated complications related to his radiation therapy and now with infection with urinary fistula connecting to abdominal wall abscess and pubic symphysis osteomyelitis who is not a surgical candidate, and now additional acute issues including pulmonary embolism and high risk anticoagulation with the possibility of recurrent GI bleeding.    He wishes to be DNR/DNI  Palliative team consulted for goals of  care discussion.   Metastatic prostate cancer: hx TURP, on ADT, hx radiation therapy c/b radiation proctitis, pubic symphysis ORN. Followed by urology. Home Xtandi , and Orgovyx are on hold per his report. Otherwise had been started on Pentoxyfylline for question of pubic symphysis osteonecrosis; considering that he is starting on anticoagulation, discontinue Pentoxyfylline due to risk of bleeding.   CAD; PAD: Continue pravastatin .  HFrEF: Echo 07/11/2024 LVEF 25 to 30%, positive wall motion abnormality, aortic root ectasia.  Unable to be on GDMT due to his hypotension requiring midodrine . Repeat Echo stable EF, ?vegetation on Aortic valve, TEE scheduled for Wednesday.  Afib not on AC: he is on heparin  drip for PE, high risk for GI bleed.  Orthostatic hypotension: Continue midodrine  3 times daily.  Anemia: Hemoglobin dropped to 7.3, occult stool negative, but he is high risk for bleeding discussed with patient. Iron  low, will give oral  supplement.  Hx of GIB 2/2 radiation proctitis: Last colonoscoyp in 8/'25, had severe radiation proctitis which was treated with APC, and was on a course of sucralfate suppositories thereafter. Resume on daily sucralfate enema ideally to reduce chance of rebleeding.   GERD: continue home PPI      Out of bed to chair. Incentive spirometry. Nursing supportive care. Fall, aspiration precautions. Diet:  Diet Orders (From admission, onward)     Start     Ordered   07/28/24 2324  Diet regular Room service appropriate? Yes; Fluid consistency: Thin  Diet effective now       Question Answer Comment  Room service appropriate? Yes   Fluid consistency: Thin      07/28/24 2324           DVT prophylaxis:   Level of care: Telemetry Medical   Code Status: Limited: Do not attempt resuscitation (DNR) -DNR-LIMITED -Do Not Intubate/DNI   Subjective: Patient is seen and examined today morning. He is weak, but states his appetite is good except after his  antibiotic infusion. Denies abdominal pain, nausea.   Physical Exam: Vitals:   07/29/24 0745 07/29/24 0840 07/29/24 0842 07/29/24 0845  BP: (!) 95/47 104/61    Pulse: 73 82  87  Resp: (!) 22 19  (!) 24  Temp:   97.7 F (36.5 C)   TempSrc:   Axillary   SpO2: 97% 93%  98%  Weight:      Height:        General - Elderly ill Caucasian thin built male, no apparent distress HEENT - PERRLA, EOMI, atraumatic head, non tender sinuses. Lung - Clear, basal rales, rhonchi, wheezes. Heart - S1, S2 heard, no murmurs, rubs, no pedal edema. Abdomen - Soft, non tender, bowel sounds good. Neuro - Alert, awake and oriented x 3, non focal exam. Skin - Warm and dry.  Data Reviewed:      Latest Ref Rng & Units 07/29/2024    2:59 AM 07/28/2024    8:21 PM 07/22/2024    7:00 AM  CBC  WBC 4.0 - 10.5 K/uL 6.5  10.0  7.4   Hemoglobin 13.0 - 17.0 g/dL 7.3  8.5  8.6   Hematocrit 39.0 - 52.0 % 23.0  26.8  27.8   Platelets 150 - 400 K/uL 216  262  323       Latest Ref Rng & Units 07/28/2024    8:21 PM 07/22/2024    7:00 AM 07/19/2024    3:49 AM  BMP  Glucose 70 - 99 mg/dL 875  872  882  BUN 8 - 23 mg/dL 31  24  20    Creatinine 0.61 - 1.24 mg/dL 9.20  9.37  9.30   Sodium 135 - 145 mmol/L 132  133  134   Potassium 3.5 - 5.1 mmol/L 4.1  3.9  4.2   Chloride 98 - 111 mmol/L 94  96  99   CO2 22 - 32 mmol/L 23  28  26    Calcium  8.9 - 10.3 mg/dL 8.1  8.4  8.2    ECHOCARDIOGRAM COMPLETE Result Date: 07/29/2024    ECHOCARDIOGRAM REPORT   Patient Name:   Ronald Kaiser Date of Exam: 07/29/2024 Medical Rec #:  [id number]      Height:       71.0 in Accession #:    [id number]     Weight:       147.7 lb Date of Birth:  23-Sep-1934     BSA:          1.854 m Patient Age:    89 years       BP:           101/59 mmHg Patient Gender: M              HR:           74 bpm. Exam Location:  Inpatient Procedure: 2D Echo and Intracardiac Opacification Agent (Both Spectral and Color            Flow Doppler were utilized  during procedure). Indications:     Pulmonary Embolus  History:         Patient has prior history of Echocardiogram examinations. CHF,                  CAD; Risk Factors:Hypertension.  Sonographer:     Charmaine Gaskins Referring Phys:  [id number] DORN DAWSON Diagnosing Phys: Georganna Archer IMPRESSIONS  1. Left ventricular ejection fraction, by estimation, is 25 to 30%. Left ventricular ejection fraction by PLAX is 26 %. The left ventricle has severely decreased function. The left ventricle demonstrates regional wall motion abnormalities (see scoring diagram/findings for description). The left ventricular internal cavity size was mildly dilated. Left ventricular diastolic parameters are consistent with Grade I diastolic dysfunction (impaired relaxation).  2. Right ventricular systolic function is normal. The right ventricular size is normal. There is normal pulmonary artery systolic pressure.  3. Left atrial size was moderately dilated.  4. The mitral valve is grossly normal. Mild mitral valve regurgitation. No evidence of mitral stenosis.  5. There is moderate thickening of the non-coronary cusp of the aortic valve with independently mobile echodensities attached. The thickening of the aortic valve is chronic; however, the mobile echodensities appear to be new. These can represent either infective endocarditis vs degenerative changes depending on the clinical context. The aortic valve is tricuspid. Aortic valve regurgitation is mild. Aortic valve sclerosis/calcification is present, without any evidence of aortic stenosis.  6. Aortic dilatation noted. There is mild dilatation of the aortic root, measuring 41 mm.  7. The inferior vena cava is normal in size with greater than 50% respiratory variability, suggesting right atrial pressure of 3 mmHg. Comparison(s): LVEF appears stable from prior. Echodensities on aortic valve. Conclusion(s)/Recommendation(s): 1. Severe LV systolic dysfunction with LVEF = 25-30%, largely  unchanged from prior. 2. Normal RV size and function. 3. Independently mobile echodensities on the NCC of the aortic valve concerning for possible infective endocarditis vs. degenerative changes. 4. Recommend a TEE to further evaluate aortic valve if clinically indicated. 5.  Recommend repeat imaging in 1 year to ensure stability of his dilated aortic root. FINDINGS  Left Ventricle: Left ventricular ejection fraction, by estimation, is 25 to 30%. Left ventricular ejection fraction by PLAX is 26 %. The left ventricle has severely decreased function. The left ventricle demonstrates regional wall motion abnormalities. Definity  contrast agent was given IV to delineate the left ventricular endocardial borders. The left ventricular internal cavity size was mildly dilated. There is no left ventricular hypertrophy. Left ventricular diastolic parameters are consistent with Grade I diastolic dysfunction (impaired relaxation).  LV Wall Scoring: The mid and distal anterior wall, mid and distal anterior septum, entire apex, and mid inferoseptal segment are akinetic. The antero-lateral wall, inferior wall, posterior wall, basal anteroseptal segment, basal anterior segment, and basal inferoseptal segment are hypokinetic. Right Ventricle: The right ventricular size is normal. No increase in right ventricular wall thickness. Right ventricular systolic function is normal. There is normal pulmonary artery systolic pressure. The tricuspid regurgitant velocity is 2.11 m/s, and  with an assumed right atrial pressure of 3 mmHg, the estimated right ventricular systolic pressure is 20.8 mmHg. Left Atrium: Left atrial size was moderately dilated. Right Atrium: Right atrial size was normal in size. Pericardium: There is no evidence of pericardial effusion. Mitral Valve: The mitral valve is grossly normal. Mild mitral valve regurgitation. No evidence of mitral valve stenosis. Tricuspid Valve: The tricuspid valve is normal in structure.  Tricuspid valve regurgitation is mild . No evidence of tricuspid stenosis. Aortic Valve: There is moderate thickening of the non-coronary cusp of the aortic valve with independently mobile echodensities attached. The thickening of the aortic valve is chronic; however, the mobile echodensities appear to be new. These can represent either infective endocarditis vs degenerative changes depending on the clinical context. The aortic valve is tricuspid. Aortic valve regurgitation is mild. Aortic valve sclerosis/calcification is present, without any evidence of aortic stenosis. Aortic valve mean gradient measures 5.0 mmHg. Aortic valve peak gradient measures 7.0 mmHg. Aortic valve area, by VTI measures 3.44 cm. Pulmonic Valve: The pulmonic valve was normal in structure. Pulmonic valve regurgitation is not visualized. No evidence of pulmonic stenosis. Aorta: Aortic dilatation noted. There is mild dilatation of the aortic root, measuring 41 mm. Venous: The inferior vena cava is normal in size with greater than 50% respiratory variability, suggesting right atrial pressure of 3 mmHg. IAS/Shunts: No atrial level shunt detected by color flow Doppler.  LEFT VENTRICLE PLAX 2D LV EF:         Left            Diastology                ventricular     LV e' medial:    6.20 cm/s                ejection        LV E/e' medial:  8.9                fraction by     LV e' lateral:   8.81 cm/s                PLAX is 26      LV E/e' lateral: 6.2                %. LVIDd:         4.90 cm LVIDs:         4.30 cm LV PW:  0.60 cm LV IVS:        0.60 cm LVOT diam:     2.40 cm LV SV:         106 LV SV Index:   57 LVOT Area:     4.52 cm LV IVRT:       169 msec  RIGHT VENTRICLE RV Basal diam:  2.70 cm     PULMONARY VEINS RV Mid diam:    2.50 cm     Diastolic Velocity: 35.10 cm/s RV S prime:     14.00 cm/s  S/D Velocity:       1.30                             Systolic Velocity:  46.40 cm/s LEFT ATRIUM             Index        RIGHT ATRIUM            Index LA diam:        3.80 cm 2.05 cm/m   RA Area:     14.50 cm LA Vol (A2C):   87.1 ml 46.99 ml/m  RA Volume:   28.70 ml  15.48 ml/m LA Vol (A4C):   88.1 ml 47.53 ml/m LA Biplane Vol: 87.1 ml 46.99 ml/m  AORTIC VALVE AV Area (Vmax):    3.26 cm AV Area (Vmean):   3.09 cm AV Area (VTI):     3.44 cm AV Vmax:           132.00 cm/s AV Vmean:          103.000 cm/s AV VTI:            0.308 m AV Peak Grad:      7.0 mmHg AV Mean Grad:      5.0 mmHg LVOT Vmax:         95.00 cm/s LVOT Vmean:        70.300 cm/s LVOT VTI:          0.234 m LVOT/AV VTI ratio: 0.76  AORTA Ao Root diam: 4.10 cm Ao Asc diam:  3.60 cm MITRAL VALVE               TRICUSPID VALVE MV Area (PHT): 3.58 cm    TR Peak grad:   17.8 mmHg MV Decel Time: 212 msec    TR Vmax:        211.00 cm/s MV E velocity: 55.00 cm/s MV A velocity: 90.70 cm/s  SHUNTS MV E/A ratio:  0.61        Systemic VTI:  0.23 m                            Systemic Diam: 2.40 cm Georganna Archer Electronically signed by Georganna Archer Signature Date/Time: 07/29/2024/10:38:06 AM    Final (Updated)    CT ABDOMEN PELVIS W CONTRAST Result Date: 07/28/2024 EXAM: CT ABDOMEN AND PELVIS WITH CONTRAST 07/28/2024 09:27:51 PM TECHNIQUE: CT of the abdomen and pelvis was performed with the administration of 75 mL of iohexol  (OMNIPAQUE ) 350 MG/ML injection. Multiplanar reformatted images are provided for review. Automated exposure control, iterative reconstruction, and/or weight-based adjustment of the mA/kV was utilized to reduce the radiation dose to as low as reasonably achievable. COMPARISON: 07/17/2024 CLINICAL HISTORY: Eval for increasing size of abdominal wall abscess. Patient BIB EMS for code sepsis. Patient reportedly admitted several days ago  for leaking bowel. Patient reports he was discharged with PICC line for IV Rocephin  at home. FINDINGS: LOWER CHEST: No acute abnormality. LIVER: The liver is unremarkable. GALLBLADDER AND BILE DUCTS: Gallbladder is unremarkable. No  biliary ductal dilatation. SPLEEN: No acute abnormality. PANCREAS: Parenchymal calcifications along the pancreaticoduodenal groove (image 40), chronic. ADRENAL GLANDS: No acute abnormality. KIDNEYS, URETERS AND BLADDER: No stones in the kidneys or ureters. No hydronephrosis. No perinephric or periureteral stranding. Foley catheter with indwelling contrast within a decompressed, thick-walled bladder. GI AND BOWEL: Stomach demonstrates no acute abnormality. There is no bowel obstruction. Moderate colonic stool burden. PERITONEUM AND RETROPERITONEUM: No ascites. No free air. VASCULATURE: Abdominal aortic atherosclerosis. Inferior abdominal aorta measures 2.8 cm, within normal limits. Consider follow-up ultrasound in 5 years, if clinically warranted. LYMPH NODES: No lymphadenopathy. REPRODUCTIVE ORGANS: No acute abnormality. BONES AND SOFT TISSUES: Destructive changes involving the parasympathetic region, left greater than right, suggesting osteomyelitis/septic joint. 4.0 x 8.3 cm fluid and gas collection within the midline lower pelvic wall (image 73), similar to the prior, although now with increased gas and decreased fluid. Inferiorly, there is phlegmonous change within the left lower perineum (image 84), difficult to discretely measure but similar to the prior, with trace residual contrast (image 82), improved. IMPRESSION: 1. Thick-walled bladder, decompressed with Foley catheter and indwelling contrast. 2. Pelvic wall fluid and gas collection measuring 4.0 x 8.3 cm, similar in size to prior, with increased gas and decreased fluid. 3. Left lower perineal phlegmon, overall similar with decreased residual contrast. 4. Osteomyelitis/septic joint involving the pubic symphysis, stable. Electronically signed by: Pinkie Pebbles MD 07/28/2024 09:45 PM EDT RP Workstation: [id number]   CT Angio Chest PE W and/or Wo Contrast Result Date: 07/28/2024 EXAM: CTA of the Chest with contrast for PE 07/28/2024 09:27:51 PM  TECHNIQUE: CTA of the chest was performed after the administration of intravenous contrast. Multiplanar reformatted images are provided for review. MIP images are provided for review. Automated exposure control, iterative reconstruction, and/or weight based adjustment of the mA/kV was utilized to reduce the radiation dose to as low as reasonably achievable. COMPARISON: CT of the chest 07/10/2024. CLINICAL HISTORY: PE workup. Patient BIB EMS for code sepsis. Patient reportedly admitted several days ago for leaking bowel. Patient reports he was discharged with PICC line for IV Rocephin  at home. FINDINGS: PULMONARY ARTERIES: Pulmonary arteries are adequately opacified for evaluation. Segmental right lower lobe pulmonary emboli are present. Segmental pulmonary emboli in the left upper lobe. Main pulmonary artery is normal in caliber. MEDIASTINUM: The heart and pericardium demonstrate no acute abnormality. There are atherosclerotic calcifications of the aorta and coronary arteries. There is no acute abnormality of the thoracic aorta. LYMPH NODES: No mediastinal, hilar or axillary lymphadenopathy. LUNGS AND PLEURA: Patchy ground-glass and interstitial opacities are seen in the bilateral lower lobes. There is some atelectasis in the lingula. No pleural effusion or pneumothorax. UPPER ABDOMEN: Limited images of the upper abdomen are unremarkable. SOFT TISSUES AND BONES: Sclerotic density in the left seventh rib appears unchanged. Sclerotic density in the posterior right eighth rib appears unchanged. There are healed posterior inferior right rib fractures. T11 and T12 compression deformities are unchanged. There is marked retropulsion of fracture fragments at T12, unchanged. Sclerotic densities in T10 and L1 are unchanged. No acute soft tissue abnormality. IMPRESSION: 1. Acute segmental pulmonary emboli in the right lower lobe and left upper lobe. No evidence for right heart strain. 2. Patchy ground-glass and interstitial  opacities in the bilateral lower lobes, which may reflect infection/inflammation  or edema. Lingular atelectasis. 3. Atherosclerotic calcifications of the aorta and coronary arteries. 4. Stable sclerotic metastatic osseous lesions. Electronically signed by: Greig Pique MD 07/28/2024 09:40 PM EDT RP Workstation: [id number]   DG Chest Port 1 View if patient is in a treatment room. Result Date: 07/28/2024 EXAM: 1 VIEW XRAY OF THE CHEST 07/28/2024 08:46:00 PM COMPARISON: Compared to May 10, 2024. CLINICAL HISTORY: Suspected Sepsis. Table formatting from the original note was not included.; Suspected Sepsis Suspected Sepsis. Table formatting from the original note was not included.; Suspected Sepsis FINDINGS: LINES, TUBES AND DEVICES: Right upper extremity PICC line is seen within the superior vena cava. LUNGS AND PLEURA: Patchy infiltrate is seen within the left mid lung zone. There is increasing streaky infiltrate within the left lung base, possibly infectious in the acute setting. The right lung is clear, with sacral linear atelectasis of the right lung base. No pneumothorax or pleural effusion. HEART AND MEDIASTINUM: Cardiac size is within normal limits. BONES AND SOFT TISSUES: No acute osseous abnormality. IMPRESSION: 1. Patchy infiltrate in the left mid lung zone and increasing streaky infiltrate in the left lung base, possibly infectious in the acute setting. Follow-up chest radiograph is recommended in 3-4 weeks, following conservative therapy, to document resolution. Electronically signed by: Dorethia Molt MD 07/28/2024 08:55 PM EDT RP Workstation: [id number]    Family Communication: Discussed with patient, wife over phone. They understand and agree. All questions answered.  Disposition: Status is: Inpatient Remains inpatient appropriate because: IV heparin , antibiotics, TEE, pending cultures  Planned Discharge Destination: Rehab     Time spent: 53 minutes  Author: Concepcion Riser,  MD 07/29/2024 11:24 AM Secure chat 7am to 7pm For on call review [email or web address].

## 2024-07-29 NOTE — Consult Note (Addendum)
 WOC Nurse Consult Note: Consult requested for middle back wound.  Chronic Unstageable pressure injury to middle spine area; 90% tightly adhered slough/eschar, 10% red.  Erthemia surrounding to wound edges. No odor, drainage, or fluctuance.  7X2.5cm. Pt does not recall how the wound occurred or how long it has been present.    Pressure Injury POA: Yes Sacrum red and nonblanchable Stage 1 pressure injury; approx 4X4cm   Topical treatment orders provided for bedside nurses to perform as follows to assist with removal of nonviable tissue:  1. Apply Santyl to back wound Q day, then cover with moist gauze and foam dressing  2. Apply Gerhardts cream to bilat buttocks and sacrum BID and PRN when turning or cleaning.  Please re-consult if further assistance is needed.  Thank-you,  Stephane Fought MSN, RN, CWOCN, CWCN-AP, CNS Contact Mon-Fri 0700-1500: [phone]

## 2024-07-29 NOTE — Evaluation (Signed)
 Physical Therapy Evaluation Patient Details Name: Ronald Kaiser MRN: [id number] DOB: 08-Jan-1934 Today's Date: 07/29/2024  History of Present Illness  Ronald Kaiser is a 89 y.o. male presented with progressive weakness, and per his report home RN concern for worsening wound on his back. PMH: metastatic prostate cancer, hx TURP, on ADT, hx radiation therapy c/b radiation proctitis, and recent complex hospitalization 10/8-21 with pubic symphysis osteomyelitis, as well as fistulous tract between the bladder and the lower anterior abdominal wall abscess, with Ucx and aspiration of the abd wall abscess by IR both + Proteus Mirabilis, felt to not be a surgical candidate, PICC line placed and was discharged with plan for 6 weeks of CTX and possible suppressive abx thereafter; additional hx includes CAD, HFrEF, Afib not on AC, orthostatic hypotension, anemia.   Clinical Impression  Pt admitted with above. Pt very irritated regarding condition and adamantly declined OOB mobility this date but also strongly desires to return home. Pt reports ambulating with rollator PTA and doing ADLS without difficulty. Pt with noted L LE weaker than R LE during movement in bed. Assistx2 to scoot pt up in gurney and set up for lunch. Acute PT to cont to follow. Pt reports Ill get up tomorrow. Acute PT to return as able to assess OOB mobility and d/c recommendations.        If plan is discharge home, recommend the following: A lot of help with walking and/or transfers;A lot of help with bathing/dressing/bathroom   Can travel by private vehicle        Equipment Recommendations None recommended by PT (has rollator)  Recommendations for Other Services       Functional Status Assessment       Precautions / Restrictions Precautions Precautions: Fall Recall of Precautions/Restrictions: Intact Restrictions Weight Bearing Restrictions Per Provider Order: No      Mobility  Bed Mobility                General bed mobility comments: 2 person assist to scoot up in bed    Transfers                   General transfer comment: pt declined stating, I'm too weak and tired. despite encouragement with assist of 2 people, pt reports tomorrow    Ambulation/Gait               General Gait Details: pt declined  Stairs            Wheelchair Mobility     Tilt Bed    Modified Rankin (Stroke Patients Only)       Balance Overall balance assessment:  (not tested)                                           Pertinent Vitals/Pain Pain Assessment Pain Assessment: No/denies pain (despite wound on spine and sacrum)    Home Living Family/patient expects to be discharged to:: Private residence Living Arrangements: Spouse/significant other Available Help at Discharge: Family;Available 24 hours/day Type of Home: House Home Access: Stairs to enter Entrance Stairs-Rails: None Entrance Stairs-Number of Steps: 1 step at front, level from garage   Home Layout: One level Home Equipment: Rollator (4 wheels);Shower seat;Grab bars - tub/shower;Hand held shower head      Prior Function Prior Level of Function : Independent/Modified Independent;Driving  Mobility Comments: Use of rollator ADLs Comments: independent in ADL, IADL, medication management; asssists wife as needed at home     Extremity/Trunk Assessment   Upper Extremity Assessment Upper Extremity Assessment: Generalized weakness    Lower Extremity Assessment Lower Extremity Assessment: Generalized weakness    Cervical / Trunk Assessment Cervical / Trunk Assessment: Other exceptions Cervical / Trunk Exceptions: open wound on spine  Communication   Communication Communication: Impaired Factors Affecting Communication: Hearing impaired    Cognition Arousal: Alert Behavior During Therapy: WFL for tasks assessed/performed   PT - Cognitive impairments: No apparent  impairments                         Following commands: Intact       Cueing Cueing Techniques: Verbal cues     General Comments General comments (skin integrity, edema, etc.): VSS    Exercises     Assessment/Plan    PT Assessment Patient needs continued PT services  PT Problem List Decreased strength;Decreased range of motion;Decreased activity tolerance;Decreased balance;Decreased mobility       PT Treatment Interventions DME instruction;Gait training;Stair training;Functional mobility training;Therapeutic activities;Therapeutic exercise;Balance training    PT Goals (Current goals can be found in the Care Plan section)  Acute Rehab PT Goals Patient Stated Goal: get strength in legs and go home PT Goal Formulation: With patient Time For Goal Achievement: 08/12/24 Potential to Achieve Goals: Good    Frequency Min 3X/week     Co-evaluation               AM-PAC PT 6 Clicks Mobility  Outcome Measure Help needed turning from your back to your side while in a flat bed without using bedrails?: A Lot Help needed moving from lying on your back to sitting on the side of a flat bed without using bedrails?: A Lot Help needed moving to and from a bed to a chair (including a wheelchair)?: Total Help needed standing up from a chair using your arms (e.g., wheelchair or bedside chair)?: Total Help needed to walk in hospital room?: Total Help needed climbing 3-5 steps with a railing? : Total 6 Click Score: 8    End of Session Equipment Utilized During Treatment: Gait belt Activity Tolerance: Patient tolerated treatment well Patient left: in bed;with call bell/phone within reach Nurse Communication: Mobility status PT Visit Diagnosis: Other abnormalities of gait and mobility (R26.89)    Time: 8749-8678 PT Time Calculation (min) (ACUTE ONLY): 31 min   Charges:   PT Evaluation $PT Eval Low Complexity: 1 Low PT Treatments $Therapeutic Activity: 8-22 mins PT  General Charges $$ ACUTE PT VISIT: 1 Visit         Norene Ames, PT, DPT Acute Rehabilitation Services Secure chat preferred Office #: [phone]   Norene CHRISTELLA Ames 07/29/2024, 2:54 PM

## 2024-07-29 NOTE — Progress Notes (Signed)
 PICC removed per protocol per MD order. Manual pressure applied for 5 mins. Vaseline gauze, gauze, and Tegaderm applied over insertion site. No bleeding or swelling noted. Instructed patient to remain in bed for thirty mins. Educated patient about S/S of infection and when to call MD; no heavy lifting or pressure on right side for 24 hours; keep dressing dry and intact for 24 hours. Pt verbalized comprehension.

## 2024-07-30 ENCOUNTER — Encounter (HOSPITAL_COMMUNITY)
Admission: EM | Disposition: A | Discharge status: Hospice | Payer: Self-pay | Source: Home / Self Care | Attending: Family Medicine

## 2024-07-30 ENCOUNTER — Inpatient Hospital Stay (HOSPITAL_COMMUNITY)

## 2024-07-30 DIAGNOSIS — A498 Other bacterial infections of unspecified site: Secondary | ICD-10-CM | POA: Diagnosis not present

## 2024-07-30 DIAGNOSIS — N322 Vesical fistula, not elsewhere classified: Secondary | ICD-10-CM

## 2024-07-30 DIAGNOSIS — L02211 Cutaneous abscess of abdominal wall: Secondary | ICD-10-CM

## 2024-07-30 DIAGNOSIS — M8618 Other acute osteomyelitis, other site: Secondary | ICD-10-CM | POA: Diagnosis not present

## 2024-07-30 DIAGNOSIS — L988 Other specified disorders of the skin and subcutaneous tissue: Secondary | ICD-10-CM

## 2024-07-30 DIAGNOSIS — I2699 Other pulmonary embolism without acute cor pulmonale: Secondary | ICD-10-CM | POA: Diagnosis not present

## 2024-07-30 DIAGNOSIS — I330 Acute and subacute infective endocarditis: Secondary | ICD-10-CM

## 2024-07-30 DIAGNOSIS — C61 Malignant neoplasm of prostate: Secondary | ICD-10-CM | POA: Diagnosis not present

## 2024-07-30 DIAGNOSIS — B964 Proteus (mirabilis) (morganii) as the cause of diseases classified elsewhere: Secondary | ICD-10-CM

## 2024-07-30 DIAGNOSIS — B9789 Other viral agents as the cause of diseases classified elsewhere: Secondary | ICD-10-CM

## 2024-07-30 DIAGNOSIS — Z515 Encounter for palliative care: Secondary | ICD-10-CM | POA: Diagnosis not present

## 2024-07-30 DIAGNOSIS — M8610 Other acute osteomyelitis, unspecified site: Secondary | ICD-10-CM | POA: Diagnosis not present

## 2024-07-30 DIAGNOSIS — Z86711 Personal history of pulmonary embolism: Secondary | ICD-10-CM | POA: Diagnosis not present

## 2024-07-30 DIAGNOSIS — R6510 Systemic inflammatory response syndrome (SIRS) of non-infectious origin without acute organ dysfunction: Secondary | ICD-10-CM | POA: Diagnosis not present

## 2024-07-30 DIAGNOSIS — M869 Osteomyelitis, unspecified: Secondary | ICD-10-CM

## 2024-07-30 LAB — BASIC METABOLIC PANEL WITH GFR
Anion gap: 10 (ref 5–15)
BUN: 21 mg/dL (ref 8–23)
CO2: 25 mmol/L (ref 22–32)
Calcium: 7.9 mg/dL — ABNORMAL LOW (ref 8.9–10.3)
Chloride: 96 mmol/L — ABNORMAL LOW (ref 98–111)
Creatinine, Ser: 0.87 mg/dL (ref 0.61–1.24)
GFR, Estimated: >60 mL/min (ref >60)
Glucose, Bld: 100 mg/dL — ABNORMAL HIGH (ref 70–99)
Potassium: 3.5 mmol/L (ref 3.5–5.1)
Sodium: 131 mmol/L — ABNORMAL LOW (ref 135–145)

## 2024-07-30 LAB — CBC
HCT: 21.5 % — ABNORMAL LOW (ref 39.0–52.0)
Hemoglobin: 7 g/dL — ABNORMAL LOW (ref 13.0–17.0)
MCH: 30.4 pg (ref 26.0–34.0)
MCHC: 32.6 g/dL (ref 30.0–36.0)
MCV: 93.5 fL (ref 80.0–100.0)
Platelets: 204 K/uL (ref 150–400)
RBC: 2.3 MIL/uL — ABNORMAL LOW (ref 4.22–5.81)
RDW: 20.7 % — ABNORMAL HIGH (ref 11.5–15.5)
WBC: 4.2 K/uL (ref 4.0–10.5)
nRBC: 0 % (ref 0.0–0.2)

## 2024-07-30 LAB — HEPARIN LEVEL (UNFRACTIONATED)
Heparin Unfractionated: 0.2 [IU]/mL — ABNORMAL LOW (ref 0.30–0.70)
Heparin Unfractionated: 0.32 [IU]/mL (ref 0.30–0.70)

## 2024-07-30 SURGERY — TRANSESOPHAGEAL ECHOCARDIOGRAM (TEE) (CATHLAB)
Anesthesia: Monitor Anesthesia Care

## 2024-07-30 MED ORDER — CHLORHEXIDINE GLUCONATE CLOTH 2 % EX PADS
6.0000 | MEDICATED_PAD | Freq: Every day | CUTANEOUS | Status: DC
  Administered 2024-07-30 – 2024-08-02 (×4): 6 via TOPICAL

## 2024-07-30 NOTE — Progress Notes (Signed)
 PHARMACY - ANTICOAGULATION CONSULT NOTE  Pharmacy Consult for heparin  Indication: pulmonary embolus  Labs: Recent Labs    07/28/24 2021 07/28/24 2330 07/29/24 0259 07/29/24 0728 07/29/24 2018 07/30/24 0524  HGB 8.5*  --  7.3*  --   --  7.0*  HCT 26.8*  --  23.0*  --   --  21.5*  PLT 262  --  216  --   --  204  LABPROT 15.5*  --   --   --   --   --   INR 1.2  --   --   --   --   --   HEPARINUNFRC  --   --   --  <0.10* <0.10* 0.20*  CREATININE 0.79  --   --   --   --  0.87  TROPONINIHS  --  54*  --   --   --   --    Assessment: 89yo male subtherapeutic on heparin  after rate change but approaching goal; no infusion issues or signs of bleeding per RN.  Goal of Therapy:  Heparin  level 0.3-0.7 units/ml   Plan:  Increase heparin  infusion by 2-3 units/kg/hr to 1600 units/hr. Check level in 8 hours.   Marvetta Dauphin, PharmD, BCPS 07/30/2024 6:22 AM

## 2024-07-30 NOTE — Progress Notes (Signed)
 Physical Therapy Treatment  Patient Details Name: Ronald Kaiser MRN: [id number] DOB: 1934-09-19 Today's Date: 07/30/2024   History of Present Illness Ronald Kaiser is a 89 y.o. male presented with progressive weakness, and per his report home RN concern for worsening wound on his back. PMH: metastatic prostate cancer, hx TURP, on ADT, hx radiation therapy c/b radiation proctitis, and recent complex hospitalization 10/8-21 with pubic symphysis osteomyelitis, as well as fistulous tract between the bladder and the lower anterior abdominal wall abscess, with Ucx and aspiration of the abd wall abscess by IR both + Proteus Mirabilis, felt to not be a surgical candidate, PICC line placed and was discharged with plan for 6 weeks of CTX and possible suppressive abx thereafter; additional hx includes CAD, HFrEF, Afib not on AC, orthostatic hypotension, anemia.    PT Comments  Pt progressing towards physical therapy goals. Was able to perform transfers and ambulation with gross modified independence to supervision for safety. Pt motivated for independence however reports fatigue from limited sleep last night and was agreeable only to minimal mobility. Overall pt states he is at/near baseline of function. No physical assist was required throughout session. Anticipate pt will be safe for d/c home from a PT standpoint when medically ready.  Of note, Heparin  subtherapeutic this morning, and MD approved therapy session.     If plan is discharge home, recommend the following: Assist for transportation;A little help with walking and/or transfers   Can travel by private vehicle        Equipment Recommendations  None recommended by PT    Recommendations for Other Services       Precautions / Restrictions Precautions Precautions: Fall Recall of Precautions/Restrictions: Intact Restrictions Weight Bearing Restrictions Per Provider Order: No     Mobility  Bed Mobility Overal bed mobility: Modified  Independent             General bed mobility comments: Pt was able to transition to/from EOB without assistance. Increased time and effort for scooting, repositioning, and elevating legs up to bed at end of session.    Transfers Overall transfer level: Modified independent Equipment used: Rollator (4 wheels)               General transfer comment: Pt states Don't touch me and powers up to full stand without assist. Mildly uncontrolled descent back down to EOB but overall pt was safe.    Ambulation/Gait Ambulation/Gait assistance: Supervision Gait Distance (Feet): 30 Feet Assistive device: Rollator (4 wheels) Gait Pattern/deviations: Trunk flexed, Decreased stride length, Step-through pattern Gait velocity: Decreased Gait velocity interpretation: <1.8 ft/sec, indicate of risk for recurrent falls   General Gait Details: Agreeable only to in-room ambulation. Therapists managing lines. Pt negotiating with rollator well and pt reports he is ambulating at his baseline of function.   Stairs             Wheelchair Mobility     Tilt Bed    Modified Rankin (Stroke Patients Only)       Balance Overall balance assessment: Mild deficits observed, not formally tested                                          Communication Communication Communication: Impaired Factors Affecting Communication: Hearing impaired  Cognition Arousal: Alert Behavior During Therapy: WFL for tasks assessed/performed   PT - Cognitive impairments: No apparent impairments  Following commands: Intact      Cueing Cueing Techniques: Verbal cues  Exercises      General Comments General comments (skin integrity, edema, etc.): Pt stood EOB for a prolonged period for hygiene and sacral foam replacement after incontinence of bowel in the bed.      Pertinent Vitals/Pain Pain Assessment Pain Assessment: Faces Faces Pain Scale: Hurts a  little bit Pain Location: Occasional grimacing at IV site Pain Descriptors / Indicators: Grimacing Pain Intervention(s): Limited activity within patient's tolerance, Monitored during session, Repositioned    Home Living Family/patient expects to be discharged to:: (P) Private residence Living Arrangements: (P) Spouse/significant other Available Help at Discharge: (P) Family;Available 24 hours/day Type of Home: (P) House Home Access: (P) Stairs to enter Entrance Stairs-Rails: (P) None Entrance Stairs-Number of Steps: (P) 1 step at front, level from garage   Home Layout: (P) One level Home Equipment: (P) Rollator (4 wheels);Shower seat;Grab bars - tub/shower;Hand held shower head      Prior Function            PT Goals (current goals can now be found in the care plan section) Acute Rehab PT Goals Patient Stated Goal: Go home ASAP. Reports he has limited time left and wants to spend it with his wife. PT Goal Formulation: With patient Time For Goal Achievement: 08/12/24 Potential to Achieve Goals: Good Progress towards PT goals: Goals updated    Frequency    Min 3X/week      PT Plan      Co-evaluation PT/OT/SLP Co-Evaluation/Treatment: Yes Reason for Co-Treatment: To address functional/ADL transfers PT goals addressed during session: Mobility/safety with mobility;Balance;Proper use of DME        AM-PAC PT 6 Clicks Mobility   Outcome Measure  Help needed turning from your back to your side while in a flat bed without using bedrails?: None Help needed moving from lying on your back to sitting on the side of a flat bed without using bedrails?: None Help needed moving to and from a bed to a chair (including a wheelchair)?: None Help needed standing up from a chair using your arms (e.g., wheelchair or bedside chair)?: A Little Help needed to walk in hospital room?: A Little Help needed climbing 3-5 steps with a railing? : A Little 6 Click Score: 21    End of  Session Equipment Utilized During Treatment: Gait belt Activity Tolerance: Patient tolerated treatment well Patient left: in bed;with call bell/phone within reach;with bed alarm set Nurse Communication: Mobility status PT Visit Diagnosis: Other abnormalities of gait and mobility (R26.89)     Time: 8844-8767 PT Time Calculation (min) (ACUTE ONLY): 37 min  Charges:    $Gait Training: 8-22 mins PT General Charges $$ ACUTE PT VISIT: 1 Visit                     Leita Sable, PT, DPT Acute Rehabilitation Services Secure Chat Preferred Office: [phone]    Leita JONETTA Sable 07/30/2024, 1:06 PM

## 2024-07-30 NOTE — Progress Notes (Signed)
 Heart Failure Navigator Progress Note  Assessed for Heart & Vascular TOC clinic readiness.  Patient does not meet criteria due to .- very poor prognosis per [facility] - appt with Dr. Pietro 11/24. No HF TOC.    Navigator will sign off at this time.   Stephane Haddock, BSN, Scientist, Clinical (histocompatibility And Immunogenetics) Only

## 2024-07-30 NOTE — Progress Notes (Signed)
 [facility]  Date of Admission:  07/28/2024      Total days of antibiotics 2   Zosyn >> c  Ceftriaxone  10/10 >> 10/27        ASSESSMENT: Ronald Kaiser is a 89 y.o. male admitted with fatigue / chills.   Generalized Fatigue / Weakness -  Chills -  H/O Abdominal wall abscess with fistulous tract b/t bladder and lower abdominal wall s/p aspiration earlier this month (Proteus Mirabilis and Aerococcus species). Contiguous osteomyelitis of the symphysis pubis. Possible this was more a/w infected/colonized PICC line which has since been removed or acute PE. NO fevers, no leukocytosis or other changes.  -continue to follow BCx -Conversations ongoing for global picture/GOC  -anemic with Hgb down to 7  Abnormal TTE -  Concern for possible aortic valve endocarditis vs degenerative changes (described to be mobile echodensities amongst chronic thickening of the AV). Cardiology felt he was not a candidate for TEE. Chance this may be marantic/non-infectious endocarditis more a/w metastatic disease and hypercoagulable state.  *If this is indeed infectious endocarditis, would presume it is sequelae from what has been going on with him systemically, ie: pelvic infection. His blood cultures from admission remain negative to date.  - continue zosyn for now - we talked about transitioning to oral indefinite therapy with Augmentin potentially to avoid further risk a/w PICC line   ?Presumed PICC infection / colonization -  Sounds like he had intermittent chills a/w injections of the ceftriaxone . This was a newer feature that developed recently and not present at the start of treatment. Could have been causing intermittent bacteremic episodes.  - PICC out  - Consider alternative options with PO regimen   Acute Pulmonary Embolism -  Noted on CT C/A/P in RLL and LUL w/o heart strain.   Goals of Care -  Discussed IR guided sampling with Ronald Kaiser to potentially aid  with decreasing bacterial burden in hopes to gain control over his infection. The aspiration he had previously did help reduce pain/discomfort a/w the fluid collection. He is not sure that's something he would want to pursue especially if a drain is recommended.  He is clear he would like to optimize time spent out of the hospital with family while keeping this infection under as much control as possible     PLAN: Continue zosyn for now Will continue conversations as to what he would like to do re: possible IR to assess for therapeutic drainage of remaining abscess, conversion to oral abx vs continue IV in the home.    Principal Problem:   Pulmonary embolism (HCC) Active Problems:   Acute pulmonary embolism (HCC)   Pressure injury of skin    collagenase   Topical Daily   feeding supplement  237 mL Oral TID BM   ferrous sulfate   325 mg Oral Q breakfast   Gerhardt's butt cream   Topical BID   midodrine   10 mg Oral TID WC   multivitamin with minerals  1 tablet Oral Daily   pantoprazole   40 mg Oral Daily   pravastatin   20 mg Oral QPM   sodium chloride  flush  3 mL Intravenous Q12H   sucralfate  2 g Rectal Daily    SUBJECTIVE: Has a lot going on. Feeling about the same  His PICC line has been removed - was having some chills during administration for a week.    Review of Systems: ROS  Allergies  Allergen  Reactions   Bactrim [Sulfamethoxazole-Trimethoprim] Hives, Shortness Of Breath and Swelling   Augmentin [Amoxicillin-Pot Clavulanate] Diarrhea   Crestor  [Rosuvastatin  Calcium ] Other (See Comments)    Back pain Grogginess  Dizziness    Zetia  [Ezetimibe ] Other (See Comments)    Back pain Grogginess  Dizziness     OBJECTIVE: Vitals:   07/29/24 2010 07/30/24 0500 07/30/24 0519 07/30/24 0813  BP: (!) 112/50  (!) 94/49 109/62  Pulse: 73  75 73  Resp: 16  16 18   Temp: 97.7 F (36.5 C)  97.7 F (36.5 C) 97.9 F (36.6 C)  TempSrc: Oral  Oral   SpO2: 100%  95% 97%   Weight:  74.1 kg    Height:       Body mass index is 22.78 kg/m.  Physical Exam Constitutional:      General: He is not in acute distress.    Appearance: He is ill-appearing.  Cardiovascular:     Rate and Rhythm: Normal rate.  Pulmonary:     Effort: Pulmonary effort is normal.  Abdominal:     General: There is no distension.  Skin:    General: Skin is warm.     Capillary Refill: Capillary refill takes less than 2 seconds.  Neurological:     Mental Status: He is oriented to person, place, and time.     Lab Results Lab Results  Component Value Date   WBC 4.2 07/30/2024   HGB 7.0 (L) 07/30/2024   HCT 21.5 (L) 07/30/2024   MCV 93.5 07/30/2024   PLT 204 07/30/2024    Lab Results  Component Value Date   CREATININE 0.87 07/30/2024   BUN 21 07/30/2024   NA 131 (L) 07/30/2024   K 3.5 07/30/2024   CL 96 (L) 07/30/2024   CO2 25 07/30/2024    Lab Results  Component Value Date   ALT 14 07/28/2024   AST 21 07/28/2024   ALKPHOS 58 07/28/2024   BILITOT 0.2 07/28/2024     Microbiology: Recent Results (from the past 240 hours)  Culture, blood (Routine x 2)     Status: None (Preliminary result)   Collection Time: 07/28/24  3:10 AM   Specimen: BLOOD LEFT FOREARM  Result Value Ref Range Status   Specimen Description BLOOD LEFT FOREARM  Final   Special Requests BOTTLES DRAWN AEROBIC AND ANAEROBIC  Final   Culture   Final    NO GROWTH 1 DAY Performed at [facility], [address] [place], [place] [address]    Report Status PENDING  Incomplete  Culture, blood (Routine x 2)     Status: None (Preliminary result)   Collection Time: 07/28/24  8:21 PM   Specimen: BLOOD  Result Value Ref Range Status   Specimen Description BLOOD SITE NOT SPECIFIED  Final   Special Requests   Final    BOTTLES DRAWN AEROBIC AND ANAEROBIC Blood Culture adequate volume   Culture   Final    NO GROWTH 2 DAYS Performed at [facility], [address] [place], [place] [address]     Report Status PENDING  Incomplete  Urine Culture     Status: None   Collection Time: 07/28/24  8:21 PM   Specimen: Urine, Random  Result Value Ref Range Status   Specimen Description URINE, RANDOM  Final   Special Requests NONE Reflexed from [id number]  Final   Culture   Final    NO GROWTH Performed at [facility], [address],  [place], [place] [address]    Report Status 07/29/2024 FINAL  Final  Resp panel by RT-PCR (RSV, Flu A&B, Covid) Anterior Nasal Swab     Status: None   Collection Time: 07/28/24 11:30 PM   Specimen: Anterior Nasal Swab  Result Value Ref Range Status   SARS Coronavirus 2 by RT PCR NEGATIVE NEGATIVE Final   Influenza A by PCR NEGATIVE NEGATIVE Final   Influenza B by PCR NEGATIVE NEGATIVE Final    Comment: (NOTE) The Xpert Xpress SARS-CoV-2/FLU/RSV plus assay is intended as an aid in the diagnosis of influenza from Nasopharyngeal swab specimens and should not be used as a sole basis for treatment. Nasal washings and aspirates are unacceptable for Xpert Xpress SARS-CoV-2/FLU/RSV testing.  Fact Sheet for Patients: bloggercourse.com  Fact Sheet for Healthcare Providers: seriousbroker.it  This test is not yet approved or cleared by the [place]  FDA and has been authorized for detection and/or diagnosis of SARS-CoV-2 by FDA under an Emergency Use Authorization (EUA). This EUA will remain in effect (meaning this test can be used) for the duration of the COVID-19 declaration under Section 564(b)(1) of the Act, 21 U.S.C. section 360bbb-3(b)(1), unless the authorization is terminated or revoked.     Resp Syncytial Virus by PCR NEGATIVE NEGATIVE Final    Comment: (NOTE) Fact Sheet for Patients: bloggercourse.com  Fact Sheet for Healthcare Providers: seriousbroker.it  This test is not yet approved or cleared by the [place]  FDA and has been  authorized for detection and/or diagnosis of SARS-CoV-2 by FDA under an Emergency Use Authorization (EUA). This EUA will remain in effect (meaning this test can be used) for the duration of the COVID-19 declaration under Section 564(b)(1) of the Act, 21 U.S.C. section 360bbb-3(b)(1), unless the authorization is terminated or revoked.  Performed at [facility], [address], [place], [place] [address]      Corean Fireman, MSN, NP-C [facility]  [email or web address] Pager: [phone] Office: [phone] [facility] Main Line: [phone] *Secure Chat Communication Welcome  Total Encounter Time: 14 m

## 2024-07-30 NOTE — Progress Notes (Signed)
 Patient ID: Ronald Kaiser, male   DOB: January 02, 1934, 89 y.o.   MRN: [id number]    Progress Note from the Palliative Medicine Team at [facility]   Patient Name: Ronald Kaiser        Date: 07/30/2024 DOB: 11/26/1933  Age: 89 y.o. MRN#: [id number] Attending Physician: Darci Pore, MD Primary Care Physician: Lynwood Laneta LELON DEVONNA Admit Date: 07/28/2024   Reason for Consultation/Follow-up   Establishing Goals of Care   HPI/ Brief Hospital Review  89 y.o. male   admitted on 07/28/2024 with  hx of metastatic prostate cancer, hx TURP, on ADT, hx radiation therapy c/b radiation proctitis, and recent complex hospitalization  10/8-21 with pubic symphysis osteomyelitis, as well as fistulous tract between the bladder and the lower anterior abdominal wall abscess, with Ucx and aspiration of the abd wall abscess by IR both + Proteus Mirabilis, felt to not be a surgical candidate, PICC line placed and was discharged with plan for 6 weeks of CTX and possible suppressive abx thereafter; additional hx includes CAD, HFrEF, Afib not on AC, orthostatic hypotension, anemia presented with progressive weakness, and per his report home RN concern for worsening wound on his back.    In the emergency department, patient had CT chest/abdomen/pelvis which showed acute pulmonary emboli in right lower lobe and left upper lobe, no heart strain, patchy ground glass and interstitial opacities in bilateral lower lobes possible infection versus atelectasis, stable sclerotic metastatic osseous lesions, osteomyelitis/septic joint involving pubic symphysis which is stable.  Concern for possible aortic valve endocarditis   Generalized Fatigue / Weakness -  Chills -  H/O Abdominal wall abscess with fistulous tract b/t bladder and lower abdominal wall s/p aspiration earlier this month (Proteus Mirabilis and Aerococcus species). Contiguous osteomyelitis of the symphysis pubis. Possible this was more a/w  infected/colonized PICC line which has since been removed or acute PE. NO fevers, no leukocytosis or other changes.  -continue to follow BCx -Conversations ongoing for global picture/GOC  -anemic with Hgb down to 7   Abnormal TTE -  Concern for possible aortic valve endocarditis vs degenerative changes (described to be mobile echodensities amongst chronic thickening of the AV). Cardiology felt he was not a candidate for TEE. Chance this may be marantic/non-infectious endocarditis more a/w metastatic disease and hypercoagulable state.  *If this is indeed infectious endocarditis, would presume it is sequelae from what has been going on with him systemically, ie: pelvic infection. His blood cultures from admission remain negative to date.  - continue zosyn for now - we talked about transitioning to oral indefinite therapy with Augmentin potentially to avoid further risk a/w PICC line    ?Presumed PICC infection / colonization -  Sounds like he had intermittent chills a/w injections of the ceftriaxone . This was a newer feature that developed recently and not present at the start of treatment. Could have been causing intermittent bacteremic episodes.  - PICC out  - Consider alternative options with PO regimen    Acute Pulmonary Embolism -  Noted on CT C/A/P in RLL and LUL w/o heart strain.    Goals of Care -  Discussed IR guided sampling with Mr. Cabiness to potentially aid with decreasing bacterial burden in hopes to gain control over his infection. The aspiration he had previously did help reduce pain/discomfort a/w the fluid collection. He is not sure that's something he would want to pursue especially if a drain is recommended.  He is clear he would like to optimize time spent out  of the hospital with family while keeping this infection under as much control as possible    Patient faced with ongoing treatment option decisions, advanced directive decisions and anticipatory care  needs.   Subjective  Extensive chart review has been completed prior to meeting with patient/family  including labs, vital signs, imaging, progress/consult notes, orders, medications and available advance directive documents.    This NP assessed patient at the bedside as a follow up to  yesterday's GOCs meeting.  Brother at bedside,       Education offered today regarding  the importance of continued conversation with family and their  medical providers regarding overall plan of care and treatment options,  ensuring decisions are within the context of the patients values and GOCs.  Questions and concerns addressed        Discussed with primary team and nursing staff   Time: 50  minutes  Detailed review of medical records ( labs, imaging, vital signs), medically appropriate exam ( MS, skin, cardiac,  resp)   discussed with treatment team, counseling and education to patient, family, staff, documenting clinical information, medication management, coordination of care    [name] NP  Palliative Medicine Team Team Phone # [phone] Pager [phone]

## 2024-07-30 NOTE — Progress Notes (Signed)
 Progress Note   Patient: TENNESSEE PERRA FMW:[id number] DOB: 1934-08-01 DOA: 07/28/2024     2 DOS: the patient was seen and examined on 07/30/2024   Brief hospital course: ANDRAE CLAUNCH is a 89 y.o. male with hx of metastatic prostate cancer, hx TURP, on ADT, hx radiation therapy c/b radiation proctitis, and recent complex hospitalization 10/8-21 with pubic symphysis osteomyelitis, as well as fistulous tract between the bladder and the lower anterior abdominal wall abscess, with Ucx and aspiration of the abd wall abscess by IR both + Proteus Mirabilis, felt to not be a surgical candidate, PICC line placed and was discharged with plan for 6 weeks of CTX and possible suppressive abx thereafter; additional hx includes CAD, HFrEF, Afib not on AC, orthostatic hypotension, anemia presented with progressive weakness, and per his report home RN concern for worsening wound on his back.   In the emergency department, patient had CT chest/abdomen/pelvis which showed acute pulmonary emboli in right lower lobe and left upper lobe, no heart strain, patchy ground glass and interstitial opacities in bilateral lower lobes possible infection versus atelectasis, stable sclerotic metastatic osseous lesions, osteomyelitis/septic joint involving pubic symphysis which is stable.  Patient is started on heparin  drip, vancomycin  and Zosyn, admitted to [facility] service for further management evaluation.  Assessment and Plan: Segmental pulmonary embolism RLL, LUL, without RV strain Likely provoked in the setting of recent sedentary behavior and an underlying malignancy.  No chest pain or shortness of breath, has symmetric lower extremity edema.  BNP / trop elevated but stable. EKG with old RBBB.  He has recent hx GI bleeding related to radiation proctitis and high risk of recurrent bleeding. Continue IV heparin , although if rebleeding develops then likely will need to stop anticoagulation.  I discussed with him, he understands risk  of bleeding. Hb 7 today, monitor H/H, he denies active bleeding. Continue heparin  drip as per pharmacy protocol. Plan to transition to Eliquis tomorrow. Palliative team on board for goals of care discussion.  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 See hx per above / last Dc summary, Abd wall and Ucx + proteus, has PICC and on CTX PTA. UA remains consistent with infection.  CT abdomen pelvis with thick-walled bladder decompressed, similar size of pelvic wall collection 4 x 8.3 cm increased gas, decreased fluid.  Left lower perineal phlegmon similar and decreased residual contrast, stable pubic symphysis osteomyelitis. Lactic acid, WBC normal.   Echo done 07/29/24 shows aortic vegetation, poor candidate for TEE. Continue broad-spectrum antibiotics vancomycin  and Zosyn in setting of possible ongoing urinary infection, possible pneumonia, endocarditis.  ID evaluated him, poor prognosis advised palliative care. PCT on board.  Follow-up blood cultures, urine cultures and final ID recs.   Hyponatremia, mild  Seems chronic. Sodium stable.   Metastatic prostate cancer: hx TURP, on ADT, hx radiation therapy c/b radiation proctitis, pubic symphysis ORN. Followed by urology. Home Xtandi , and Orgovyx are on hold per his report. Otherwise had been started on Pentoxyfylline for question of pubic symphysis osteonecrosis; considering that he is starting on anticoagulation, stop Pentoxyfylline due to risk of bleeding.   CAD; PAD: Continue pravastatin .  HFrEF: Echo 07/11/2024 LVEF 25 to 30%, positive wall motion abnormality, aortic root ectasia.  Unable to be on GDMT due to his hypotension requiring midodrine . Repeat Echo stable EF, ?vegetation on Aortic valve,  not a candidate for  TEE per cardiology.  I discussed with him and brother at bedside.  Afib not on AC: he is on heparin  drip for PE, remains high risk for GI bleed.  Orthostatic hypotension: Continue midodrine  3 times daily.  Anemia: Hemoglobin dropped to 7.3, occult stool negative, but he is high risk for bleeding discussed with patient. Iron  low, will give oral  supplement.  Hx of GIB 2/2 radiation proctitis: Last colonoscoyp in 8/'25, had severe radiation proctitis which was treated with APC, and was on a course of sucralfate suppositories thereafter. Resume on daily sucralfate enema ideally to reduce chance of rebleeding.   GERD: continue home PPI.  Pressure ulcer mid back  Continue dressing changes as per wound care.   Deconditioning Failure to thrive PT OT evaluation for discharge planning. He wishes to home and wishes to avoid rehab facility.  Goals of care Overall very ill gentleman with metastatic prostate cancer and associated complications related to his radiation therapy and now with infection with urinary fistula connecting to abdominal wall abscess and pubic symphysis osteomyelitis who is not a surgical candidate, and now additional acute issues including pulmonary embolism and high risk anticoagulation with the possibility of recurrent GI bleeding. He wishes to be DNR/DNI  Palliative team following.     Out of bed to chair. Incentive spirometry. Nursing supportive care. Fall, aspiration precautions. Diet:  Diet Orders (From admission, onward)     Start     Ordered   07/28/24 2324  Diet regular Room service appropriate? Yes; Fluid consistency: Thin  Diet effective now       Question Answer Comment  Room service appropriate? Yes   Fluid consistency: Thin      07/28/24 2324           DVT prophylaxis:   Level of care: Telemetry Medical   Code Status: Limited: Do not attempt resuscitation (DNR) -DNR-LIMITED -Do Not Intubate/DNI   Subjective: Patient is seen and examined today  morning. He is weak, but states his appetite is good except after his antibiotic infusion. Denies abdominal pain, nausea.   Physical Exam: Vitals:   07/29/24 2010 07/30/24 0500 07/30/24 0519 07/30/24 0813  BP: (!) 112/50  (!) 94/49 109/62  Pulse: 73  75 73  Resp: 16  16 18   Temp: 97.7 F (36.5 C)  97.7 F (36.5 C) 97.9 F (36.6 C)  TempSrc: Oral  Oral   SpO2: 100%  95% 97%  Weight:  74.1 kg    Height:        General - Elderly ill Caucasian thin built male, no apparent distress HEENT - PERRLA, EOMI, atraumatic head, non tender sinuses. Lung - Clear, basal rales, rhonchi, wheezes. Heart - S1, S2 heard, no murmurs, rubs, no pedal edema. Abdomen - Soft, non tender, bowel sounds good. Neuro - Alert, awake and oriented x 3, non focal exam. Skin - Warm and dry.  Data Reviewed:      Latest Ref Rng & Units 07/30/2024    5:24 AM 07/29/2024    2:59 AM 07/28/2024    8:21 PM  CBC  WBC 4.0 - 10.5 K/uL 4.2  6.5  10.0   Hemoglobin 13.0 - 17.0 g/dL 7.0  7.3  8.5   Hematocrit 39.0 - 52.0 % 21.5  23.0  26.8   Platelets 150 - 400 K/uL 204  216  262       Latest Ref Rng & Units 07/30/2024    5:24 AM 07/28/2024  8:21 PM 07/22/2024    7:00 AM  BMP  Glucose 70 - 99 mg/dL 899  875  872   BUN 8 - 23 mg/dL 21  31  24    Creatinine 0.61 - 1.24 mg/dL 9.12  9.20  9.37   Sodium 135 - 145 mmol/L 131  132  133   Potassium 3.5 - 5.1 mmol/L 3.5  4.1  3.9   Chloride 98 - 111 mmol/L 96  94  96   CO2 22 - 32 mmol/L 25  23  28    Calcium  8.9 - 10.3 mg/dL 7.9  8.1  8.4    ECHOCARDIOGRAM COMPLETE Result Date: 07/29/2024    ECHOCARDIOGRAM REPORT   Patient Name:   NORIS KULINSKI Date of Exam: 07/29/2024 Medical Rec #:  [id number]      Height:       71.0 in Accession #:    [id number]     Weight:       147.7 lb Date of Birth:  [birth year]     BSA:          1.854 m Patient Age:    89 years       BP:           101/59 mmHg Patient Gender: M              HR:           74 bpm. Exam Location:  Inpatient  Procedure: 2D Echo and Intracardiac Opacification Agent (Both Spectral and Color            Flow Doppler were utilized during procedure). Indications:     Pulmonary Embolus  History:         Patient has prior history of Echocardiogram examinations. CHF,                  CAD; Risk Factors:Hypertension.  Sonographer:     Charmaine Gaskins Referring Phys:  [id number] DORN DAWSON Diagnosing Phys: Georganna Archer IMPRESSIONS  1. Left ventricular ejection fraction, by estimation, is 25 to 30%. Left ventricular ejection fraction by PLAX is 26 %. The left ventricle has severely decreased function. The left ventricle demonstrates regional wall motion abnormalities (see scoring diagram/findings for description). The left ventricular internal cavity size was mildly dilated. Left ventricular diastolic parameters are consistent with Grade I diastolic dysfunction (impaired relaxation).  2. Right ventricular systolic function is normal. The right ventricular size is normal. There is normal pulmonary artery systolic pressure.  3. Left atrial size was moderately dilated.  4. The mitral valve is grossly normal. Mild mitral valve regurgitation. No evidence of mitral stenosis.  5. There is moderate thickening of the non-coronary cusp of the aortic valve with independently mobile echodensities attached. The thickening of the aortic valve is chronic; however, the mobile echodensities appear to be new. These can represent either infective endocarditis vs degenerative changes depending on the clinical context. The aortic valve is tricuspid. Aortic valve regurgitation is mild. Aortic valve sclerosis/calcification is present, without any evidence of aortic stenosis.  6. Aortic dilatation noted. There is mild dilatation of the aortic root, measuring 41 mm.  7. The inferior vena cava is normal in size with greater than 50% respiratory variability, suggesting right atrial pressure of 3 mmHg. Comparison(s): LVEF appears stable from prior.  Echodensities on aortic valve. Conclusion(s)/Recommendation(s): 1. Severe LV systolic dysfunction with LVEF = 25-30%, largely unchanged from prior. 2. Normal RV size and function. 3. Independently mobile echodensities on the NCC of the  aortic valve concerning for possible infective endocarditis vs. degenerative changes. 4. Recommend a TEE to further evaluate aortic valve if clinically indicated. 5. Recommend repeat imaging in 1 year to ensure stability of his dilated aortic root. FINDINGS  Left Ventricle: Left ventricular ejection fraction, by estimation, is 25 to 30%. Left ventricular ejection fraction by PLAX is 26 %. The left ventricle has severely decreased function. The left ventricle demonstrates regional wall motion abnormalities. Definity  contrast agent was given IV to delineate the left ventricular endocardial borders. The left ventricular internal cavity size was mildly dilated. There is no left ventricular hypertrophy. Left ventricular diastolic parameters are consistent with Grade I diastolic dysfunction (impaired relaxation).  LV Wall Scoring: The mid and distal anterior wall, mid and distal anterior septum, entire apex, and mid inferoseptal segment are akinetic. The antero-lateral wall, inferior wall, posterior wall, basal anteroseptal segment, basal anterior segment, and basal inferoseptal segment are hypokinetic. Right Ventricle: The right ventricular size is normal. No increase in right ventricular wall thickness. Right ventricular systolic function is normal. There is normal pulmonary artery systolic pressure. The tricuspid regurgitant velocity is 2.11 m/s, and  with an assumed right atrial pressure of 3 mmHg, the estimated right ventricular systolic pressure is 20.8 mmHg. Left Atrium: Left atrial size was moderately dilated. Right Atrium: Right atrial size was normal in size. Pericardium: There is no evidence of pericardial effusion. Mitral Valve: The mitral valve is grossly normal. Mild mitral  valve regurgitation. No evidence of mitral valve stenosis. Tricuspid Valve: The tricuspid valve is normal in structure. Tricuspid valve regurgitation is mild . No evidence of tricuspid stenosis. Aortic Valve: There is moderate thickening of the non-coronary cusp of the aortic valve with independently mobile echodensities attached. The thickening of the aortic valve is chronic; however, the mobile echodensities appear to be new. These can represent either infective endocarditis vs degenerative changes depending on the clinical context. The aortic valve is tricuspid. Aortic valve regurgitation is mild. Aortic valve sclerosis/calcification is present, without any evidence of aortic stenosis. Aortic valve mean gradient measures 5.0 mmHg. Aortic valve peak gradient measures 7.0 mmHg. Aortic valve area, by VTI measures 3.44 cm. Pulmonic Valve: The pulmonic valve was normal in structure. Pulmonic valve regurgitation is not visualized. No evidence of pulmonic stenosis. Aorta: Aortic dilatation noted. There is mild dilatation of the aortic root, measuring 41 mm. Venous: The inferior vena cava is normal in size with greater than 50% respiratory variability, suggesting right atrial pressure of 3 mmHg. IAS/Shunts: No atrial level shunt detected by color flow Doppler.  LEFT VENTRICLE PLAX 2D LV EF:         Left            Diastology                ventricular     LV e' medial:    6.20 cm/s                ejection        LV E/e' medial:  8.9                fraction by     LV e' lateral:   8.81 cm/s                PLAX is 26      LV E/e' lateral: 6.2                %. LVIDd:  4.90 cm LVIDs:         4.30 cm LV PW:         0.60 cm LV IVS:        0.60 cm LVOT diam:     2.40 cm LV SV:         106 LV SV Index:   57 LVOT Area:     4.52 cm LV IVRT:       169 msec  RIGHT VENTRICLE RV Basal diam:  2.70 cm     PULMONARY VEINS RV Mid diam:    2.50 cm     Diastolic Velocity: 35.10 cm/s RV S prime:     14.00 cm/s  S/D Velocity:        1.30                             Systolic Velocity:  46.40 cm/s LEFT ATRIUM             Index        RIGHT ATRIUM           Index LA diam:        3.80 cm 2.05 cm/m   RA Area:     14.50 cm LA Vol (A2C):   87.1 ml 46.99 ml/m  RA Volume:   28.70 ml  15.48 ml/m LA Vol (A4C):   88.1 ml 47.53 ml/m LA Biplane Vol: 87.1 ml 46.99 ml/m  AORTIC VALVE AV Area (Vmax):    3.26 cm AV Area (Vmean):   3.09 cm AV Area (VTI):     3.44 cm AV Vmax:           132.00 cm/s AV Vmean:          103.000 cm/s AV VTI:            0.308 m AV Peak Grad:      7.0 mmHg AV Mean Grad:      5.0 mmHg LVOT Vmax:         95.00 cm/s LVOT Vmean:        70.300 cm/s LVOT VTI:          0.234 m LVOT/AV VTI ratio: 0.76  AORTA Ao Root diam: 4.10 cm Ao Asc diam:  3.60 cm MITRAL VALVE               TRICUSPID VALVE MV Area (PHT): 3.58 cm    TR Peak grad:   17.8 mmHg MV Decel Time: 212 msec    TR Vmax:        211.00 cm/s MV E velocity: 55.00 cm/s MV A velocity: 90.70 cm/s  SHUNTS MV E/A ratio:  0.61        Systemic VTI:  0.23 m                            Systemic Diam: 2.40 cm Georganna Archer Electronically signed by Georganna Archer Signature Date/Time: 07/29/2024/10:38:06 AM    Final (Updated)    CT ABDOMEN PELVIS W CONTRAST Result Date: 07/28/2024 EXAM: CT ABDOMEN AND PELVIS WITH CONTRAST 07/28/2024 09:27:51 PM TECHNIQUE: CT of the abdomen and pelvis was performed with the administration of 75 mL of iohexol  (OMNIPAQUE ) 350 MG/ML injection. Multiplanar reformatted images are provided for review. Automated exposure control, iterative reconstruction, and/or weight-based adjustment of the mA/kV was utilized to reduce the radiation dose to as low as reasonably achievable. COMPARISON:  07/17/2024 CLINICAL HISTORY: Eval for increasing size of abdominal wall abscess. Patient BIB EMS for code sepsis. Patient reportedly admitted several days ago for leaking bowel. Patient reports he was discharged with PICC line for IV Rocephin  at home. FINDINGS: LOWER CHEST:  No acute abnormality. LIVER: The liver is unremarkable. GALLBLADDER AND BILE DUCTS: Gallbladder is unremarkable. No biliary ductal dilatation. SPLEEN: No acute abnormality. PANCREAS: Parenchymal calcifications along the pancreaticoduodenal groove (image 40), chronic. ADRENAL GLANDS: No acute abnormality. KIDNEYS, URETERS AND BLADDER: No stones in the kidneys or ureters. No hydronephrosis. No perinephric or periureteral stranding. Foley catheter with indwelling contrast within a decompressed, thick-walled bladder. GI AND BOWEL: Stomach demonstrates no acute abnormality. There is no bowel obstruction. Moderate colonic stool burden. PERITONEUM AND RETROPERITONEUM: No ascites. No free air. VASCULATURE: Abdominal aortic atherosclerosis. Inferior abdominal aorta measures 2.8 cm, within normal limits. Consider follow-up ultrasound in 5 years, if clinically warranted. LYMPH NODES: No lymphadenopathy. REPRODUCTIVE ORGANS: No acute abnormality. BONES AND SOFT TISSUES: Destructive changes involving the parasympathetic region, left greater than right, suggesting osteomyelitis/septic joint. 4.0 x 8.3 cm fluid and gas collection within the midline lower pelvic wall (image 73), similar to the prior, although now with increased gas and decreased fluid. Inferiorly, there is phlegmonous change within the left lower perineum (image 84), difficult to discretely measure but similar to the prior, with trace residual contrast (image 82), improved. IMPRESSION: 1. Thick-walled bladder, decompressed with Foley catheter and indwelling contrast. 2. Pelvic wall fluid and gas collection measuring 4.0 x 8.3 cm, similar in size to prior, with increased gas and decreased fluid. 3. Left lower perineal phlegmon, overall similar with decreased residual contrast. 4. Osteomyelitis/septic joint involving the pubic symphysis, stable. Electronically signed by: Pinkie Pebbles MD 07/28/2024 09:45 PM EDT RP Workstation: [id number]   CT Angio Chest PE W  and/or Wo Contrast Result Date: 07/28/2024 EXAM: CTA of the Chest with contrast for PE 07/28/2024 09:27:51 PM TECHNIQUE: CTA of the chest was performed after the administration of intravenous contrast. Multiplanar reformatted images are provided for review. MIP images are provided for review. Automated exposure control, iterative reconstruction, and/or weight based adjustment of the mA/kV was utilized to reduce the radiation dose to as low as reasonably achievable. COMPARISON: CT of the chest 07/10/2024. CLINICAL HISTORY: PE workup. Patient BIB EMS for code sepsis. Patient reportedly admitted several days ago for leaking bowel. Patient reports he was discharged with PICC line for IV Rocephin  at home. FINDINGS: PULMONARY ARTERIES: Pulmonary arteries are adequately opacified for evaluation. Segmental right lower lobe pulmonary emboli are present. Segmental pulmonary emboli in the left upper lobe. Main pulmonary artery is normal in caliber. MEDIASTINUM: The heart and pericardium demonstrate no acute abnormality. There are atherosclerotic calcifications of the aorta and coronary arteries. There is no acute abnormality of the thoracic aorta. LYMPH NODES: No mediastinal, hilar or axillary lymphadenopathy. LUNGS AND PLEURA: Patchy ground-glass and interstitial opacities are seen in the bilateral lower lobes. There is some atelectasis in the lingula. No pleural effusion or pneumothorax. UPPER ABDOMEN: Limited images of the upper abdomen are unremarkable. SOFT TISSUES AND BONES: Sclerotic density in the left seventh rib appears unchanged. Sclerotic density in the posterior right eighth rib appears unchanged. There are healed posterior inferior right rib fractures. T11 and T12 compression deformities are unchanged. There is marked retropulsion of fracture fragments at T12, unchanged. Sclerotic densities in T10 and L1 are unchanged. No acute soft tissue abnormality. IMPRESSION: 1. Acute segmental pulmonary emboli in the  right lower lobe and left  upper lobe. No evidence for right heart strain. 2. Patchy ground-glass and interstitial opacities in the bilateral lower lobes, which may reflect infection/inflammation or edema. Lingular atelectasis. 3. Atherosclerotic calcifications of the aorta and coronary arteries. 4. Stable sclerotic metastatic osseous lesions. Electronically signed by: Greig Pique MD 07/28/2024 09:40 PM EDT RP Workstation: [id number]   DG Chest Port 1 View if patient is in a treatment room. Result Date: 07/28/2024 EXAM: 1 VIEW XRAY OF THE CHEST 07/28/2024 08:46:00 PM COMPARISON: Compared to May 10, 2024. CLINICAL HISTORY: Suspected Sepsis. Table formatting from the original note was not included.; Suspected Sepsis Suspected Sepsis. Table formatting from the original note was not included.; Suspected Sepsis FINDINGS: LINES, TUBES AND DEVICES: Right upper extremity PICC line is seen within the superior vena cava. LUNGS AND PLEURA: Patchy infiltrate is seen within the left mid lung zone. There is increasing streaky infiltrate within the left lung base, possibly infectious in the acute setting. The right lung is clear, with sacral linear atelectasis of the right lung base. No pneumothorax or pleural effusion. HEART AND MEDIASTINUM: Cardiac size is within normal limits. BONES AND SOFT TISSUES: No acute osseous abnormality. IMPRESSION: 1. Patchy infiltrate in the left mid lung zone and increasing streaky infiltrate in the left lung base, possibly infectious in the acute setting. Follow-up chest radiograph is recommended in 3-4 weeks, following conservative therapy, to document resolution. Electronically signed by: Dorethia Molt MD 07/28/2024 08:55 PM EDT RP Workstation: [id number]    Family Communication: Discussed with patient, brother at bedside. They understand and agree. All questions answered.  Disposition: Status is: Inpatient Remains inpatient appropriate because: IV heparin , antibiotics, pending  cultures, monitor Hb.  Planned Discharge Destination: Rehab     Time spent: 52 minutes  Author: Concepcion Riser, MD 07/30/2024 2:16 PM Secure chat 7am to 7pm For on call review [email or web address].

## 2024-07-30 NOTE — Progress Notes (Signed)
 OT Cancellation Note  Patient Details Name: LEODIS ALCOCER MRN: [id number] DOB: 10-Mar-1934   Cancelled Treatment:    Reason Eval/Treat Not Completed: Other (comment) Most recent Heparin  level 0.20 is below therapeutic range. OT to continue to follow Pt as appropriate.   Maurilio CROME, OTR/LSABRA  [facility]  Office: [phone]   Maurilio PARAS Cope Marte 07/30/2024, 8:16 AM

## 2024-07-30 NOTE — Evaluation (Signed)
 Occupational Therapy Evaluation Patient Details Name: Ronald Kaiser MRN: [id number] DOB: [birth year] Today's Date: 07/30/2024   History of Present Illness   Ronald Kaiser is a 89 y.o. male presented with progressive weakness, and per his report home RN concern for worsening wound on his back. PMH: metastatic prostate cancer, hx TURP, on ADT, hx radiation therapy c/b radiation proctitis, and recent complex hospitalization 10/8-21 with pubic symphysis osteomyelitis, as well as fistulous tract between the bladder and the lower anterior abdominal wall abscess, with Ucx and aspiration of the abd wall abscess by IR both + Proteus Mirabilis, felt to not be a surgical candidate, PICC line placed and was discharged with plan for 6 weeks of CTX and possible suppressive abx thereafter; additional hx includes CAD, HFrEF, Afib not on AC, orthostatic hypotension, anemia.     Clinical Impressions PTA Pt was independent with ADL/IADL tasks and assisted wife as needed d/t age-related macular degeneration. Pt utilized rollator for functional transfers. Pt overall states he is near his baseline abilities. Mod I with functional mobility this date. Pt expressed goal to practice toilet transfer prior to d/c home as that is his only concern related to occupational engagement. OT to follow-up with one additional session to facilitate safe toilet transfer. Anticipate Pt will be safe for d/c home from OT stand point once medically stable. Of note, Heparin  subtherapeutic this morning, and MD approved therapy session.       If plan is discharge home, recommend the following:   A little help with walking and/or transfers;A little help with bathing/dressing/bathroom;Assist for transportation;Help with stairs or ramp for entrance     Functional Status Assessment   Patient has had a recent decline in their functional status and demonstrates the ability to make significant improvements in function in a reasonable and  predictable amount of time.     Equipment Recommendations   None recommended by OT     Recommendations for Other Services         Precautions/Restrictions   Precautions Precautions: Fall Recall of Precautions/Restrictions: Intact Restrictions Weight Bearing Restrictions Per Provider Order: No     Mobility Bed Mobility Overal bed mobility: Modified Independent             General bed mobility comments: Pt was able to transition to/from EOB without assistance. Increased time and effort for scooting, repositioning, and elevating legs up to bed at end of session.    Transfers Overall transfer level: Modified independent Equipment used: Rollator (4 wheels)               General transfer comment: Pt states Don't touch me and powers up to full stand without assist. Mildly uncontrolled descent back down to EOB but overall pt was safe.      Balance Overall balance assessment: Mild deficits observed, not formally tested                                         ADL either performed or assessed with clinical judgement   ADL Overall ADL's : Needs assistance/impaired                         Toilet Transfer: Modified Independent   Toileting- Clothing Manipulation and Hygiene: Moderate assistance;Sit to/from stand         General ADL Comments: Pt overall with generlized BUE weakness, endorses main  challenge is toileting     Vision Patient Visual Report: No change from baseline Vision Assessment?: No apparent visual deficits     Perception         Praxis         Pertinent Vitals/Pain Pain Assessment Pain Assessment: Faces Faces Pain Scale: Hurts a little bit Pain Location: occasional grimace at IV site Pain Descriptors / Indicators: Grimacing Pain Intervention(s): Limited activity within patient's tolerance, Monitored during session, Repositioned     Extremity/Trunk Assessment Upper Extremity Assessment Upper  Extremity Assessment: Generalized weakness   Lower Extremity Assessment Lower Extremity Assessment: Defer to PT evaluation       Communication Communication Communication: Impaired Factors Affecting Communication: Hearing impaired   Cognition Arousal: Alert Behavior During Therapy: WFL for tasks assessed/performed Cognition: No apparent impairments                               Following commands: Intact       Cueing  General Comments   Cueing Techniques: Verbal cues  Pt stood EOB for a prolonged period for hygiene and sacral foam replacement after incontinence of bowel in the bed.   Exercises     Shoulder Instructions      Home Living Family/patient expects to be discharged to:: Private residence Living Arrangements: Spouse/significant other Available Help at Discharge: Family;Available 24 hours/day Type of Home: House Home Access: Stairs to enter [employer] of Steps: 1 step at front, level from garage Entrance Stairs-Rails: None Home Layout: One level     Bathroom Shower/Tub: Tub/shower unit;Walk-in shower   Bathroom Toilet: Handicapped height Bathroom Accessibility: Yes   Home Equipment: Rollator (4 wheels);Shower seat;Grab bars - tub/shower;Hand held shower head          Prior Functioning/Environment Prior Level of Function : Independent/Modified Independent;Driving             Mobility Comments: Use of rollator ADLs Comments: independent in ADL, IADL, medication management; asssists wife as needed at home as she has age-related macular degeneration    OT Problem List: Decreased strength;Decreased activity tolerance;Impaired UE functional use   OT Treatment/Interventions: Self-care/ADL training;Therapeutic activities;Patient/family education      OT Goals(Current goals can be found in the care plan section)   Acute Rehab OT Goals Patient Stated Goal: to practice toilet transfer OT Goal Formulation: With  patient Time For Goal Achievement: 08/05/24 Potential to Achieve Goals: Good ADL Goals Pt Will Transfer to Toilet: with modified independence;regular height toilet;ambulating   OT Frequency:  Other (comment) (1 additional session)    Co-evaluation PT/OT/SLP Co-Evaluation/Treatment: Yes Reason for Co-Treatment: To address functional/ADL transfers PT goals addressed during session: Mobility/safety with mobility;Balance;Proper use of DME OT goals addressed during session: Proper use of Adaptive equipment and DME;ADL's and self-care      AM-PAC OT 6 Clicks Daily Activity     Outcome Measure Help from another person eating meals?: None Help from another person taking care of personal grooming?: None Help from another person toileting, which includes using toliet, bedpan, or urinal?: A Lot Help from another person bathing (including washing, rinsing, drying)?: A Little Help from another person to put on and taking off regular upper body clothing?: None Help from another person to put on and taking off regular lower body clothing?: None 6 Click Score: 21   End of Session Equipment Utilized During Treatment: Rollator (4 wheels)  Activity Tolerance: Patient tolerated treatment well Patient left: in bed;with  call bell/phone within reach;with bed alarm set  OT Visit Diagnosis: Unsteadiness on feet (R26.81);Other (comment) (BUE weakness)                Time: 8842-8766 OT Time Calculation (min): 36 min Charges:  OT General Charges $OT Visit: 1 Visit OT Evaluation $OT Eval Low Complexity: 1 Low  Maurilio CROME, OTR/L.  [facility]  Office: [phone]   Maurilio PARAS Yahira Timberman 07/30/2024, 2:14 PM

## 2024-07-30 NOTE — Progress Notes (Signed)
 PHARMACY - ANTICOAGULATION CONSULT NOTE  Pharmacy Consult for heparin  Indication: pulmonary embolus  Allergies  Allergen Reactions   Bactrim [Sulfamethoxazole-Trimethoprim] Hives, Shortness Of Breath and Swelling   Augmentin [Amoxicillin-Pot Clavulanate] Diarrhea   Crestor  [Rosuvastatin  Calcium ] Other (See Comments)    Back pain Grogginess  Dizziness    Zetia  [Ezetimibe ] Other (See Comments)    Back pain Grogginess  Dizziness     Patient Measurements: Height: 5' 11 (180.3 cm) Weight: 74.1 kg (163 lb 5.8 oz) (bed weight) IBW/kg (Calculated) : 75.3 HEPARIN  DW (KG): 67  Vital Signs: Temp: 97.9 F (36.6 C) (10/28 0813) Temp Source: Oral (10/28 0519) BP: 109/62 (10/28 0813) Pulse Rate: 73 (10/28 0813)  Labs: Recent Labs    07/28/24 2021 07/28/24 2330 07/29/24 0259 07/29/24 0728 07/29/24 2018 07/30/24 0524 07/30/24 1412  HGB 8.5*  --  7.3*  --   --  7.0*  --   HCT 26.8*  --  23.0*  --   --  21.5*  --   PLT 262  --  216  --   --  204  --   LABPROT 15.5*  --   --   --   --   --   --   INR 1.2  --   --   --   --   --   --   HEPARINUNFRC  --   --   --    < > <0.10* 0.20* 0.32  CREATININE 0.79  --   --   --   --  0.87  --   TROPONINIHS  --  54*  --   --   --   --   --    < > = values in this interval not displayed.    Estimated Creatinine Clearance: 61.5 mL/min (by C-G formula based on SCr of 0.87 mg/dL).  Assessment: 68 yoM with PE on chest CT. Pt with recent infected rectus sheath hematoma so MD consulted pharmacy to dose IV heparin  with no bolus. No AC PTA.   Heparin  level = 0.32 and at the low end of goal on heparin  1600 units/hr  Goal of Therapy:  Heparin  level 0.3-0.7 units/ml Monitor platelets by anticoagulation protocol: Yes   Plan:  Increase heparin  gtt to 1700 units/hr Heparin  level and CBC in am  Prentice Poisson, PharmD Clinical Pharmacist **Pharmacist phone directory can now be found on [email or web address] ([email or web address]).  Listed under [facility].

## 2024-07-31 ENCOUNTER — Telehealth: Payer: Self-pay | Admitting: Cardiology

## 2024-07-31 DIAGNOSIS — R188 Other ascites: Secondary | ICD-10-CM | POA: Diagnosis not present

## 2024-07-31 DIAGNOSIS — E43 Unspecified severe protein-calorie malnutrition: Secondary | ICD-10-CM

## 2024-07-31 DIAGNOSIS — I959 Hypotension, unspecified: Secondary | ICD-10-CM | POA: Insufficient documentation

## 2024-07-31 DIAGNOSIS — C61 Malignant neoplasm of prostate: Secondary | ICD-10-CM | POA: Diagnosis not present

## 2024-07-31 DIAGNOSIS — M869 Osteomyelitis, unspecified: Secondary | ICD-10-CM | POA: Diagnosis not present

## 2024-07-31 DIAGNOSIS — M8630 Chronic multifocal osteomyelitis, unspecified site: Secondary | ICD-10-CM | POA: Diagnosis not present

## 2024-07-31 DIAGNOSIS — L02211 Cutaneous abscess of abdominal wall: Secondary | ICD-10-CM | POA: Diagnosis not present

## 2024-07-31 DIAGNOSIS — I2699 Other pulmonary embolism without acute cor pulmonale: Secondary | ICD-10-CM | POA: Diagnosis not present

## 2024-07-31 DIAGNOSIS — I5022 Chronic systolic (congestive) heart failure: Secondary | ICD-10-CM

## 2024-07-31 DIAGNOSIS — Z515 Encounter for palliative care: Secondary | ICD-10-CM | POA: Diagnosis not present

## 2024-07-31 LAB — HEPARIN LEVEL (UNFRACTIONATED)
Heparin Unfractionated: 0.37 [IU]/mL (ref 0.30–0.70)
Heparin Unfractionated: 0.44 [IU]/mL (ref 0.30–0.70)

## 2024-07-31 LAB — CBC
HCT: 23.3 % — ABNORMAL LOW (ref 39.0–52.0)
Hemoglobin: 7.5 g/dL — ABNORMAL LOW (ref 13.0–17.0)
MCH: 30.5 pg (ref 26.0–34.0)
MCHC: 32.2 g/dL (ref 30.0–36.0)
MCV: 94.7 fL (ref 80.0–100.0)
Platelets: 223 K/uL (ref 150–400)
RBC: 2.46 MIL/uL — ABNORMAL LOW (ref 4.22–5.81)
RDW: 20.6 % — ABNORMAL HIGH (ref 11.5–15.5)
WBC: 4.5 K/uL (ref 4.0–10.5)
nRBC: 0 % (ref 0.0–0.2)

## 2024-07-31 LAB — BASIC METABOLIC PANEL WITH GFR
Anion gap: 8 (ref 5–15)
BUN: 19 mg/dL (ref 8–23)
CO2: 26 mmol/L (ref 22–32)
Calcium: 8.1 mg/dL — ABNORMAL LOW (ref 8.9–10.3)
Chloride: 97 mmol/L — ABNORMAL LOW (ref 98–111)
Creatinine, Ser: 0.66 mg/dL (ref 0.61–1.24)
GFR, Estimated: >60 mL/min (ref >60)
Glucose, Bld: 101 mg/dL — ABNORMAL HIGH (ref 70–99)
Potassium: 4 mmol/L (ref 3.5–5.1)
Sodium: 131 mmol/L — ABNORMAL LOW (ref 135–145)

## 2024-07-31 NOTE — Hospital Course (Signed)
 89 y.o. M with CAD, prosCA metastatic to Bone on Xtandi , history Afib, history radiation proctitis, sCHF EF 25-30%, recent admission for septic shock due to pelvic osteomyelitis and abdominal wall abscess with bladder fistula who returned with malaise, found to have new PE, possible PICC infection.   ID consulted, started on heparin  for PE.

## 2024-07-31 NOTE — Telephone Encounter (Signed)
 Patient identification verified by 2 forms.   Called and spoke to patient's wife. Patient's wife states:  -Pt is in the hospital He is in a very bad way. -Dr. Pietro told us  if we needed him we should call him so I'm calling.  -He has blood clots in his heart.           Interventions/Plan: -Pt's wife aware Dr. Pietro is not in the office.  -Will get message to Dr. Pietro and his nurse.   Patient's wife agrees with plan, no questions at this time.

## 2024-07-31 NOTE — Assessment & Plan Note (Signed)
-   Can resume Xtandi  at discharge

## 2024-07-31 NOTE — Assessment & Plan Note (Addendum)
 Abscess of anterior abdominal wall Fistula to bladder from abscess Discussed with IR, this is small, mostly air, and needle placement would be technically challenging and of little value if it is mostly air.  Patient has expressed desire to avoid further lines/drains.  As noted in ID note, there was some concern for endocarditis, but there is limited available evidence to support this, blood cultures x2 negative here, and overall we recommend against PICC line at discharge, given his condition at admission. - Continue antibiotics, ID recommend trasntiion to amoxicillin/Flagyl - Consult ID - Can resume Trental at discharge

## 2024-07-31 NOTE — Telephone Encounter (Signed)
 Pt's spouse called requesting a c/b from Dr Pietro due to pt being in the hospital. Pt's spouse would like the Dr input. Please advise

## 2024-07-31 NOTE — Progress Notes (Signed)
 [facility]  Date of Admission:  07/28/2024      Total days of antibiotics 3   Zosyn >> c  Ceftriaxone  10/10 >> 10/27         ASSESSMENT: Ronald Kaiser is a 89 y.o. male admitted with fatigue / chills.   Generalized Fatigue / Weakness -  Chills -  H/O Abdominal wall abscess with fistulous tract b/t bladder and lower abdominal wall s/p aspiration earlier this month (Proteus Mirabilis and Aerococcus species). Contiguous osteomyelitis of the symphysis pubis with concern over fistula component.  ?concern for aortic valve infection vs hypercoagulable/degenerative finding.  -continue to follow BCx (negative preliminarily)  -Conversations ongoing for global picture/GOC   Abnormal TTE -  Concern for possible aortic valve endocarditis vs degenerative changes (described to be mobile echodensities amongst chronic thickening of the AV). Cardiology felt he was not a candidate for TEE. Chance this may be marantic/non-infectious endocarditis more a/w metastatic disease and hypercoagulable state.  *If this is indeed infectious endocarditis, would presume it is sequelae from what has been going on with him systemically, ie: pelvic infection. His blood cultures from admission remain negative to date.  - continue zosyn for now while decisions are being made.  - we would favor transition to oral therapy to allow for comfort and lesser invasive care in accordance to GOC.   ?Presumed PICC infection / colonization -  Sounds like he had intermittent chills a/w injections of the ceftriaxone . This was a newer feature that developed recently and not present at the start of treatment. Could have been causing intermittent bacteremic episodes.  - PICC out (resolved)   Acute Pulmonary Embolism -  Noted on CT C/A/P in RLL and LUL w/o heart strain.   Goals of Care -  Several options we could approach here - more decisions to be made. Appreciate Mary's ability to keep  patient's wishes recognized and honored.     PLAN: Continue zosyn for now Will continue conversations as to what he would like to do re: possible IR to assess for therapeutic drainage of remaining abscess, conversion to oral abx vs continue IV in the home.    Principal Problem:   Pulmonary embolism (HCC) Active Problems:   Acute pulmonary embolism (HCC)   Pressure injury of skin   Acute bacterial endocarditis   Osteomyelitis of pelvic region [facility])   Fistula   Abscess of abdominal wall   Prostate cancer metastatic to multiple sites (HCC)    Chlorhexidine  Gluconate Cloth  6 each Topical Daily   collagenase   Topical Daily   feeding supplement  237 mL Oral TID BM   ferrous sulfate   325 mg Oral Q breakfast   Gerhardt's butt cream   Topical BID   midodrine   10 mg Oral TID WC   multivitamin with minerals  1 tablet Oral Daily   pantoprazole   40 mg Oral Daily   pravastatin   20 mg Oral QPM   sodium chloride  flush  3 mL Intravenous Q12H   sucralfate  2 g Rectal Daily    SUBJECTIVE: Meeting with PMT now - speaking with him and wife and son, Ronald Kaiser.    Review of Systems: ROS  Allergies  Allergen Reactions   Bactrim [Sulfamethoxazole-Trimethoprim] Hives, Shortness Of Breath and Swelling   Augmentin [Amoxicillin-Pot Clavulanate] Diarrhea   Crestor  [Rosuvastatin  Calcium ] Other (See Comments)    Back pain Grogginess  Dizziness    Zetia  [Ezetimibe ] Other (See Comments)  Back pain Grogginess  Dizziness     OBJECTIVE: Vitals:   07/30/24 1919 07/31/24 0400 07/31/24 0500 07/31/24 0731  BP: (!) 103/58 (!) 100/52  (!) 111/57  Pulse: 82 78  82  Resp: 16 18  18   Temp: 98.1 F (36.7 C) 98 F (36.7 C)  97.8 F (36.6 C)  TempSrc: Oral Oral    SpO2: 97% 94%  97%  Weight:   72.4 kg   Height:       Body mass index is 22.26 kg/m.  Physical Exam Constitutional:      General: He is not in acute distress.    Appearance: He is ill-appearing.  Cardiovascular:     Rate and  Rhythm: Normal rate.  Pulmonary:     Effort: Pulmonary effort is normal.  Abdominal:     General: There is no distension.  Skin:    General: Skin is warm.     Capillary Refill: Capillary refill takes less than 2 seconds.  Neurological:     Mental Status: He is oriented to person, place, and time.     Lab Results Lab Results  Component Value Date   WBC 4.5 07/31/2024   HGB 7.5 (L) 07/31/2024   HCT 23.3 (L) 07/31/2024   MCV 94.7 07/31/2024   PLT 223 07/31/2024    Lab Results  Component Value Date   CREATININE 0.66 07/31/2024   BUN 19 07/31/2024   NA 131 (L) 07/31/2024   K 4.0 07/31/2024   CL 97 (L) 07/31/2024   CO2 26 07/31/2024    Lab Results  Component Value Date   ALT 14 07/28/2024   AST 21 07/28/2024   ALKPHOS 58 07/28/2024   BILITOT 0.2 07/28/2024     Microbiology: Recent Results (from the past 240 hours)  Culture, blood (Routine x 2)     Status: None (Preliminary result)   Collection Time: 07/28/24  3:10 AM   Specimen: BLOOD LEFT FOREARM  Result Value Ref Range Status   Specimen Description BLOOD LEFT FOREARM  Final   Special Requests BOTTLES DRAWN AEROBIC AND ANAEROBIC  Final   Culture   Final    NO GROWTH 2 DAYS Performed at [facility], [address] [place], [place] [address]    Report Status PENDING  Incomplete  Culture, blood (Routine x 2)     Status: None (Preliminary result)   Collection Time: 07/28/24  8:21 PM   Specimen: BLOOD  Result Value Ref Range Status   Specimen Description BLOOD SITE NOT SPECIFIED  Final   Special Requests   Final    BOTTLES DRAWN AEROBIC AND ANAEROBIC Blood Culture adequate volume   Culture   Final    NO GROWTH 3 DAYS Performed at [facility], [address] [place], [place] [address]    Report Status PENDING  Incomplete  Urine Culture     Status: None   Collection Time: 07/28/24  8:21 PM   Specimen: Urine, Random  Result Value Ref Range Status   Specimen Description URINE, RANDOM  Final    Special Requests NONE Reflexed from [id number]  Final   Culture   Final    NO GROWTH Performed at [facility], [address] [place], [place] [address]    Report Status 07/29/2024 FINAL  Final  Resp panel by RT-PCR (RSV, Flu A&B, Covid) Anterior Nasal Swab     Status: None   Collection Time: 07/28/24 11:30 PM   Specimen: Anterior Nasal Swab  Result Value Ref Range Status   SARS Coronavirus 2 by RT PCR NEGATIVE NEGATIVE Final   Influenza A by PCR NEGATIVE NEGATIVE Final   Influenza B by PCR NEGATIVE NEGATIVE Final    Comment: (NOTE) The Xpert Xpress SARS-CoV-2/FLU/RSV plus assay is intended as an aid in the diagnosis of influenza from Nasopharyngeal swab specimens and should not be used as a sole basis for treatment. Nasal washings and aspirates are unacceptable for Xpert Xpress SARS-CoV-2/FLU/RSV testing.  Fact Sheet for Patients: bloggercourse.com  Fact Sheet for Healthcare Providers: seriousbroker.it  This test is not yet approved or cleared by the [place]  FDA and has been authorized for detection and/or diagnosis of SARS-CoV-2 by FDA under an Emergency Use Authorization (EUA). This EUA will remain in effect (meaning this test can be used) for the duration of the COVID-19 declaration under Section 564(b)(1) of the Act, 21 U.S.C. section 360bbb-3(b)(1), unless the authorization is terminated or revoked.     Resp Syncytial Virus by PCR NEGATIVE NEGATIVE Final    Comment: (NOTE) Fact Sheet for Patients: bloggercourse.com  Fact Sheet for Healthcare Providers: seriousbroker.it  This test is not yet approved or cleared by the [place]  FDA and has been authorized for detection and/or diagnosis of SARS-CoV-2 by FDA under an Emergency Use Authorization (EUA). This EUA will remain in effect (meaning this test can be used) for the duration of the COVID-19 declaration  under Section 564(b)(1) of the Act, 21 U.S.C. section 360bbb-3(b)(1), unless the authorization is terminated or revoked.  Performed at [facility], [address], [place], [place] [address]      Corean Fireman, MSN, NP-C [facility]  [email or web address] Pager: [phone] Office: [phone] [facility] Main Line: [phone] *Secure Chat Communication Welcome  Total Encounter Time: 14 m

## 2024-07-31 NOTE — Assessment & Plan Note (Addendum)
 EF 25-30%.  Appears euvolemic.  No longer on diuretics.

## 2024-07-31 NOTE — Plan of Care (Signed)

## 2024-07-31 NOTE — Assessment & Plan Note (Signed)
 Unstageable, mid back, present on admission - Santyl - Wet to dry - Daily change - Offload

## 2024-07-31 NOTE — Assessment & Plan Note (Signed)
 Continue midodrine 

## 2024-07-31 NOTE — Progress Notes (Signed)
 Patient ID: Ronald Kaiser, male   DOB: 03-Sep-1934, 89 y.o.   MRN: [id number]    Progress Note from the Palliative Medicine Team at [facility]   Patient Name: Ronald Kaiser        Date: 07/31/2024 DOB: Jul 25, 1934  Age: 89 y.o. MRN#: [id number] Attending Physician: Jonel Lonni SQUIBB, * Primary Care Physician: Lynwood Laneta LELON DEVONNA Admit Date: 07/28/2024   Reason for Consultation/Follow-up   Establishing Goals of Care   HPI/ Brief Hospital Review  89 y.o. male   admitted on 07/28/2024 with  hx of metastatic prostate cancer, hx TURP, on ADT, hx radiation therapy c/b radiation proctitis, CAD, HFrEF, Afib not on AC, orthostatic hypotension, anemia presented with progressive weakness, and  worsening wound on his back.    Recent complex hospitalization 10/8-21 with pubic symphysis osteomyelitis, as well as fistulous tract between the bladder and the lower anterior abdominal wall abscess, with Ucx and aspiration of the abd wall abscess by IR both + Proteus Mirabilis, felt to not be a surgical candidate, PICC line placed and was discharged with plan for 6 weeks of CTX and possible suppressive abx thereafter.       In the emergency department, patient had CT chest/abdomen/pelvis which showed acute pulmonary emboli in right lower lobe and left upper lobe, no heart strain, patchy ground glass and interstitial opacities in bilateral lower lobes possible infection versus atelectasis, stable sclerotic metastatic osseous lesions, osteomyelitis/septic joint involving pubic symphysis which is stable.  Admitted for treatment and stabilization   Concern for abnormal TEE /possible  aortic valve endocarditis, pulmonary embolus, osteomyelitis, abdominal wall fistula.   Patient and family face treatment option decisions,advanced directive decisions and anticipatory care needs.    Subjective  Extensive chart review has been completed prior to meeting with patient/family  including labs, vital  signs, imaging, progress/consult notes, orders, medications and available advance directive documents.    This NP assessed patient at the bedside,  Ronald Kaiser is alert and oriented and engaged.  He is very weak and frail.  Follow up to  yesterday's GOCs meeting and to speak as scheduled with wife and son/ Ronald Kaiser by phone for ongoing discussion regarding treatment options, advanced directive decision and anticipatory care needs. Patient is alert and oriented..       Detailed conversation regarding patient's current medical situation and the likely associated poor prognosis.  While at bedside, collaborated with attending and ID as they too updated Ronald Kaiser  ( 60 minutes)  I returned later in the day (1600)  at family request to meet with them in person.  Again detailed education offered on patient's complex medical situation specific to possible endocarditis and osteomyelitis/abscess, rational for foley, CHF, frailty, poor nutritional status.  We reviewed updates from the day from the treatment team.  No IR interventions  recommended at this time.  Education/conversation regarding human mortality and overall failure to thrive, and the limitations of medical interventions to prolong quality  when a body fails to thrive.  Education offered on the difference between an aggressive medical intervention path--attempting to prolong life verses a comfort path, allowing for  a natural death.  Education offered on hospice benefit; philosophy and eligibility.   Patient was able to express his wishes and hope for quality time at home; enjoying his wife and little dog Precious, lemon cheesecake and  fresh air.   MOST form completed  Plan of Care; - DNR/DNI - no artifical feeding now or in the future - avoid  rehospitalization  - continue oral antibiotics at home on discharge - hospice services at home, placed referral   Questions and concerns addressed        Discussed with primary team/ Dr  Jonel, ID and nursing staff   Time: 120  minutes  Detailed review of medical records ( labs, imaging, vital signs), medically appropriate exam ( MS, skin, cardiac,  resp)   discussed with treatment team, counseling and education to patient, family, staff, documenting clinical information, medication management, coordination of care    [name] NP  Palliative Medicine Team Team Phone # [phone] Pager [phone]

## 2024-07-31 NOTE — Progress Notes (Signed)
 PHARMACY - ANTICOAGULATION CONSULT NOTE  Pharmacy Consult for heparin  Indication: pulmonary embolus  Allergies  Allergen Reactions   Bactrim [Sulfamethoxazole-Trimethoprim] Hives, Shortness Of Breath and Swelling   Augmentin [Amoxicillin-Pot Clavulanate] Diarrhea   Crestor  [Rosuvastatin  Calcium ] Other (See Comments)    Back pain Grogginess  Dizziness    Zetia  [Ezetimibe ] Other (See Comments)    Back pain Grogginess  Dizziness     Patient Measurements: Height: 5' 11 (180.3 cm) Weight: 72.4 kg (159 lb 9.8 oz) IBW/kg (Calculated) : 75.3 HEPARIN  DW (KG): 67  Vital Signs: Temp: 98 F (36.7 C) (10/29 0400) Temp Source: Oral (10/29 0400) BP: 100/52 (10/29 0400) Pulse Rate: 78 (10/29 0400)  Labs: Recent Labs    07/28/24 2021 07/28/24 2330 07/29/24 0259 07/29/24 0728 07/30/24 0524 07/30/24 1412 07/31/24 0350  HGB 8.5*  --  7.3*  --  7.0*  --  7.5*  HCT 26.8*  --  23.0*  --  21.5*  --  23.3*  PLT 262  --  216  --  204  --  223  LABPROT 15.5*  --   --   --   --   --   --   INR 1.2  --   --   --   --   --   --   HEPARINUNFRC  --   --   --    < > 0.20* 0.32 0.37  CREATININE 0.79  --   --   --  0.87  --  0.66  TROPONINIHS  --  54*  --   --   --   --   --    < > = values in this interval not displayed.    Estimated Creatinine Clearance: 65.4 mL/min (by C-G formula based on SCr of 0.66 mg/dL).  Assessment: 17 yoM with PE on chest CT. Pt with recent infected rectus sheath hematoma so MD consulted pharmacy to dose IV heparin  with no bolus. No AC PTA.   10/29 AM update: HL 0.37 Hgb 7.5 PLT wnl No signs of bleeding . Pauses w/ gtt  Goal of Therapy:  Heparin  level 0.3-0.7 units/ml Monitor platelets by anticoagulation protocol: Yes   Plan:  Continue heparin  gtt to 1700 units/hr cHL 1200 Daily HL / CBC Monitor for signs of bleeding / pauses w/ hep gtt    Benedetta Heath BS, PharmD, BCPS Clinical Pharmacist 07/31/2024 7:17 AM  Contact: [phone] after 3 PM

## 2024-07-31 NOTE — Care Management Important Message (Signed)
 Important Message  Patient Details  Name: Ronald Kaiser MRN: [id number] Date of Birth: [birth year]   Important Message Given:  Yes - Medicare IM     Jon Cruel 07/31/2024, 1:12 PM

## 2024-07-31 NOTE — Assessment & Plan Note (Signed)
 Continue pravastatin 

## 2024-07-31 NOTE — Assessment & Plan Note (Signed)
-   Continue nutrition supplements

## 2024-07-31 NOTE — Assessment & Plan Note (Signed)
 DVT of the right leg Discussed with IR.  These PEs are very small, of indeterminate age.  The ultrasound shows a calf vein and small mid thigh DVT, again, very small burden.  Given his anemia, FTT, and prior proctitis, systemic anticoagulation would almost certainly lead to symptomatic anemia within days to weeks and lead to more harm than likely benefit.  Likewise, the risk of IVC filter, discussed with Dr. Vanice today, likely would cause more harm than good.  This was discussed with family, and they understand and agree with plan to avoid sysetmic anticoagulation and filter both.

## 2024-07-31 NOTE — Progress Notes (Signed)
 PHARMACY - ANTICOAGULATION CONSULT NOTE  Pharmacy Consult for heparin  Indication: pulmonary embolus  Allergies  Allergen Reactions   Bactrim [Sulfamethoxazole-Trimethoprim] Hives, Shortness Of Breath and Swelling   Augmentin [Amoxicillin-Pot Clavulanate] Diarrhea   Crestor  [Rosuvastatin  Calcium ] Other (See Comments)    Back pain Grogginess  Dizziness    Zetia  [Ezetimibe ] Other (See Comments)    Back pain Grogginess  Dizziness     Patient Measurements: Height: 5' 11 (180.3 cm) Weight: 72.4 kg (159 lb 9.8 oz) IBW/kg (Calculated) : 75.3 HEPARIN  DW (KG): 67  Vital Signs: Temp: 97.8 F (36.6 C) (10/29 0731) Temp Source: Oral (10/29 0400) BP: 111/57 (10/29 0731) Pulse Rate: 82 (10/29 0731)  Labs: Recent Labs    07/28/24 2021 07/28/24 2330 07/29/24 0259 07/29/24 0728 07/30/24 0524 07/30/24 1412 07/31/24 0350 07/31/24 1145  HGB 8.5*  --  7.3*  --  7.0*  --  7.5*  --   HCT 26.8*  --  23.0*  --  21.5*  --  23.3*  --   PLT 262  --  216  --  204  --  223  --   LABPROT 15.5*  --   --   --   --   --   --   --   INR 1.2  --   --   --   --   --   --   --   HEPARINUNFRC  --   --   --    < > 0.20* 0.32 0.37 0.44  CREATININE 0.79  --   --   --  0.87  --  0.66  --   TROPONINIHS  --  54*  --   --   --   --   --   --    < > = values in this interval not displayed.    Estimated Creatinine Clearance: 65.4 mL/min (by C-G formula based on SCr of 0.66 mg/dL).  Assessment: 65 yoM with PE on chest CT. Pt with recent infected rectus sheath hematoma so MD consulted pharmacy to dose IV heparin  with no bolus. No AC PTA.   10/29 PM update: HL 0.44 Hgb 7.5 PLT wnl No signs of bleeding . Pauses w/ gtt  Goal of Therapy:  Heparin  level 0.3-0.7 units/ml Monitor platelets by anticoagulation protocol: Yes   Plan:  Continue heparin  gtt to 1700 units/hr Daily HL / CBC Monitor for signs of bleeding / pauses w/ hep gtt    Benedetta Heath BS, PharmD, BCPS Clinical Pharmacist 07/31/2024  12:55 PM  Contact: [phone] after 3 PM

## 2024-07-31 NOTE — Assessment & Plan Note (Signed)
Not on anticoagulation 

## 2024-07-31 NOTE — Progress Notes (Signed)
  Progress Note   Patient: Ronald Kaiser FMW:[id number] DOB: 26-Apr-1934 DOA: 07/28/2024     3 DOS: the patient was seen and examined on 07/31/2024 at 9:05AM and 4:30PM      Brief hospital course: 89 y.o. M with CAD, prosCA metastatic to Bone on Xtandi , history Afib, history radiation proctitis, sCHF EF 25-30%, recent admission for septic shock due to pelvic osteomyelitis and abdominal wall abscess with bladder fistula who returned with malaise, found to have new PE, possible PICC infection.   ID consulted, started on heparin  for PE.         Assessment and Plan: * Acute pulmonary embolism (HCC) DVT of the right leg Discussed with IR.  These PEs are very small, of indeterminate age.  The ultrasound shows a calf vein and small mid thigh DVT, again, very small burden.  Given his anemia, FTT, and prior proctitis, systemic anticoagulation would almost certainly lead to symptomatic anemia within days to weeks and lead to more harm than likely benefit.  Likewise, the risk of IVC filter, discussed with Dr. Vanice today, likely would cause more harm than good.  This was discussed with family, and they understand and agree with plan to avoid sysetmic anticoagulation and filter both.  Chronic systolic CHF (congestive heart failure) (HCC) EF 25-30%.  Appears euvolemic.  No longer on diuretics.  Hypotension - Continue midodrine   Prostate cancer metastatic to multiple sites [facility]) - Can resume Xtandi  at discharge  Osteomyelitis of pelvic region [facility]) Abscess of anterior abdominal wall Fistula to bladder from abscess Discussed with IR, this is small, mostly air, and needle placement would be technically challenging and of little value if it is mostly air.  Patient has expressed desire to avoid further lines/drains.  As noted in ID note, there was some concern for endocarditis, but there is limited available evidence to support this, blood cultures x2 negative here, and overall we recommend  against PICC line at discharge, given his condition at admission. - Continue IV antibiotics - Consult ID - Plan to transition to orals at discharge - Can resume Trental at discharge  Pressure injury of skin Unstageable, mid back, present on admission - Santyl - Wet to dry - Daily change - Offload  Protein-calorie malnutrition, severe - Continue nutrition supplements  Paroxysmal atrial fibrillation (HCC) Not on anticoagulation  Coronary artery disease - Continue pravastatin   Hyponatremia Mild, asymptomatic - No further work up        Subjective: Feeling okay, no feer, no confusion.     Physical Exam: BP (!) 109/58   Pulse 90   Temp 97.7 F (36.5 C)   Resp 16   Ht 5' 11 (1.803 m)   Wt 72.4 kg   SpO2 99%   BMI 22.26 kg/m   Thin adult male, lying in bed, interactive and appropriate RRR, no murmurs, no peripheral edema Respiratory normal, lungs clear without rales or wheezes Abdomen soft with tenderness palpation or guarding, no ascites or distention Attention normal, affect normal, judgment and insight appear normal, diffuse loss of subcutaneous muscle mass and fat   Data Reviewed: Discussed with palliative care, infectious disease CBC shows hemoglobin 7.5 Basic metabolic panel shows sodium 131    Family Communication: Wife and son    Disposition: Status is: Inpatient         Author: Lonni SHAUNNA Dalton, MD 07/31/2024 5:23 PM  For on call review [email or web address].

## 2024-08-01 ENCOUNTER — Other Ambulatory Visit (HOSPITAL_COMMUNITY): Payer: Self-pay

## 2024-08-01 DIAGNOSIS — Z515 Encounter for palliative care: Secondary | ICD-10-CM

## 2024-08-01 DIAGNOSIS — C61 Malignant neoplasm of prostate: Secondary | ICD-10-CM | POA: Diagnosis not present

## 2024-08-01 DIAGNOSIS — R627 Adult failure to thrive: Secondary | ICD-10-CM | POA: Diagnosis not present

## 2024-08-01 DIAGNOSIS — I358 Other nonrheumatic aortic valve disorders: Secondary | ICD-10-CM

## 2024-08-01 DIAGNOSIS — L02211 Cutaneous abscess of abdominal wall: Secondary | ICD-10-CM | POA: Diagnosis not present

## 2024-08-01 DIAGNOSIS — L988 Other specified disorders of the skin and subcutaneous tissue: Secondary | ICD-10-CM | POA: Diagnosis not present

## 2024-08-01 DIAGNOSIS — M8630 Chronic multifocal osteomyelitis, unspecified site: Secondary | ICD-10-CM

## 2024-08-01 LAB — BASIC METABOLIC PANEL WITH GFR
Anion gap: 10 (ref 5–15)
BUN: 15 mg/dL (ref 8–23)
CO2: 28 mmol/L (ref 22–32)
Calcium: 8.2 mg/dL — ABNORMAL LOW (ref 8.9–10.3)
Chloride: 98 mmol/L (ref 98–111)
Creatinine, Ser: 0.54 mg/dL — ABNORMAL LOW (ref 0.61–1.24)
GFR, Estimated: >60 mL/min (ref >60)
Glucose, Bld: 107 mg/dL — ABNORMAL HIGH (ref 70–99)
Potassium: 4.2 mmol/L (ref 3.5–5.1)
Sodium: 136 mmol/L (ref 135–145)

## 2024-08-01 LAB — CBC
HCT: 25.6 % — ABNORMAL LOW (ref 39.0–52.0)
Hemoglobin: 7.9 g/dL — ABNORMAL LOW (ref 13.0–17.0)
MCH: 29.8 pg (ref 26.0–34.0)
MCHC: 30.9 g/dL (ref 30.0–36.0)
MCV: 96.6 fL (ref 80.0–100.0)
Platelets: 242 K/uL (ref 150–400)
RBC: 2.65 MIL/uL — ABNORMAL LOW (ref 4.22–5.81)
RDW: 21 % — ABNORMAL HIGH (ref 11.5–15.5)
WBC: 4.8 K/uL (ref 4.0–10.5)
nRBC: 0 % (ref 0.0–0.2)

## 2024-08-01 MED ORDER — METRONIDAZOLE 500 MG PO TABS
500.0000 mg | ORAL_TABLET | Freq: Two times a day (BID) | ORAL | Status: DC
  Administered 2024-08-01 – 2024-08-02 (×3): 500 mg via ORAL
  Filled 2024-08-01 (×3): qty 1

## 2024-08-01 MED ORDER — AMOXICILLIN 500 MG PO CAPS
1000.0000 mg | ORAL_CAPSULE | Freq: Three times a day (TID) | ORAL | Status: DC
  Administered 2024-08-01 – 2024-08-02 (×3): 1000 mg via ORAL
  Filled 2024-08-01 (×6): qty 2

## 2024-08-01 MED ORDER — METRONIDAZOLE 500 MG PO TABS
500.0000 mg | ORAL_TABLET | Freq: Two times a day (BID) | ORAL | 0 refills | Status: AC
  Filled 2024-08-01 – 2024-08-02 (×2): qty 60, 30d supply, fill #0

## 2024-08-01 MED ORDER — AMOXICILLIN 500 MG PO CAPS
1000.0000 mg | ORAL_CAPSULE | Freq: Three times a day (TID) | ORAL | 0 refills | Status: AC
  Filled 2024-08-01 – 2024-08-02 (×2): qty 180, 30d supply, fill #0

## 2024-08-01 NOTE — Progress Notes (Addendum)
 [facility] Liaison Note  Received request from [place] for hospice services at home after discharge. Spoke with patient's wife and son to initiate education related to hospice philosophy, services and team approach to care. Both verbalized understanding of information given. Per discussion, the plan is for discharge home tomorrow.   DME needs discussed. Patient has the following equipment in the home: rollator. Family requests the following equipment for delivery: hospital bed, overbed table, BSC, oxygen.  Please send signed and completed DNR home with patient/family. Please provide prescriptions at discharge as needed to ensure ongoing symptom management.  [facility] information and contact numbers given to family. Please call with any concerns.  Thank you for the opportunity to participate in this patient's care.   Eleanor Nail, LPN [facility] Liaison [phone]

## 2024-08-01 NOTE — Progress Notes (Signed)
 [facility]  Date of Admission:  07/28/2024      Total days of antibiotics 4   Zosyn >> c  Ceftriaxone  10/10 >> 10/27         ASSESSMENT: Ronald Kaiser is a 89 y.o. male admitted with fatigue / chills.   Generalized Fatigue / Weakness -  Chills -  H/O Abdominal wall abscess with fistulous tract b/t bladder and lower abdominal wall s/p aspiration earlier this month (Proteus Mirabilis and Aerococcus species). Contiguous osteomyelitis of the symphysis pubis with concern over fistula component.  ?concern for aortic valve infection vs hypercoagulable/degenerative finding.  -continue to follow BCx (negative preliminarily)  -Conversations ongoing for global picture/GOC   Abnormal TTE -  Concern for possible aortic valve endocarditis vs degenerative changes (described to be mobile echodensities amongst chronic thickening of the AV). Cardiology felt he was not a candidate for TEE. Chance this may be marantic/non-infectious endocarditis more a/w metastatic disease and hypercoagulable state.  *If this is indeed infectious endocarditis, would presume it is sequelae from what has been going on with him systemically, ie: pelvic infection. His blood cultures from admission remain negative to date.  - wall agree switch to oral therapy to allow for comfort and lesser invasive care in accordance to GOC.   ?Presumed PICC infection / colonization -  Sounds like he had intermittent chills a/w injections of the ceftriaxone . This was a newer feature that developed recently and not present at the start of treatment. Could have been causing intermittent bacteremic episodes.  - PICC out (resolved)   Acute Pulmonary Embolism -  Noted on CT C/A/P in RLL and LUL w/o heart strain.   Goals of Care -  Plan will be to go home with oral antibiotics as outlined above   ID will sign off - please call back with any questions/concerns or if we can be of further assistance.      PLAN: Appt with Annalee 11/20 as previously scheduled.  Switch to high dose amoxicillin 1 gm TID and metronidazole 500 mg BID  After a month we can potentailly consolidate to augmentin or amox 500 mg bid for suppression    Principal Problem:   Acute pulmonary embolism (HCC) Active Problems:   Coronary artery disease   Chronic systolic CHF (congestive heart failure) (HCC)   Paroxysmal atrial fibrillation (HCC)   Protein-calorie malnutrition, severe   Pressure injury of skin   Osteomyelitis of pelvic region (HCC)   Fistula   Abscess of abdominal wall   Prostate cancer metastatic to multiple sites (HCC)   Hypotension   Chronic multifocal osteomyelitis (HCC)   Adult failure to thrive    Chlorhexidine  Gluconate Cloth  6 each Topical Daily   collagenase   Topical Daily   feeding supplement  237 mL Oral TID BM   ferrous sulfate   325 mg Oral Q breakfast   Gerhardt's butt cream   Topical BID   midodrine   10 mg Oral TID WC   multivitamin with minerals  1 tablet Oral Daily   pantoprazole   40 mg Oral Daily   pravastatin   20 mg Oral QPM   sodium chloride  flush  3 mL Intravenous Q12H   sucralfate  2 g Rectal Daily    SUBJECTIVE: Wants urinary leg bag    Review of Systems: ROS  Allergies  Allergen Reactions   Bactrim [Sulfamethoxazole-Trimethoprim] Hives, Shortness Of Breath and Swelling   Augmentin [Amoxicillin-Pot Clavulanate] Diarrhea  Crestor  [Rosuvastatin  Calcium ] Other (See Comments)    Back pain Grogginess  Dizziness    Zetia  [Ezetimibe ] Other (See Comments)    Back pain Grogginess  Dizziness     OBJECTIVE: Vitals:   07/31/24 0731 07/31/24 1545 07/31/24 1928 08/01/24 0723  BP: (!) 111/57 (!) 109/58 119/60 (!) 106/52  Pulse: 82 90 83 87  Resp: 18 16 13    Temp: 97.8 F (36.6 C) 97.7 F (36.5 C) 98.9 F (37.2 C) 97.9 F (36.6 C)  TempSrc:      SpO2: 97% 99% 99% 95%  Weight:      Height:       Body mass index is 22.26 kg/m.  Physical  Exam Constitutional:      General: He is not in acute distress.    Appearance: He is ill-appearing.  Cardiovascular:     Rate and Rhythm: Normal rate.  Pulmonary:     Effort: Pulmonary effort is normal.  Abdominal:     General: There is no distension.  Skin:    General: Skin is warm.     Capillary Refill: Capillary refill takes less than 2 seconds.  Neurological:     Mental Status: He is oriented to person, place, and time.     Lab Results Lab Results  Component Value Date   WBC 4.8 08/01/2024   HGB 7.9 (L) 08/01/2024   HCT 25.6 (L) 08/01/2024   MCV 96.6 08/01/2024   PLT 242 08/01/2024    Lab Results  Component Value Date   CREATININE 0.54 (L) 08/01/2024   BUN 15 08/01/2024   NA 136 08/01/2024   K 4.2 08/01/2024   CL 98 08/01/2024   CO2 28 08/01/2024    Lab Results  Component Value Date   ALT 14 07/28/2024   AST 21 07/28/2024   ALKPHOS 58 07/28/2024   BILITOT 0.2 07/28/2024     Microbiology: Recent Results (from the past 240 hours)  Culture, blood (Routine x 2)     Status: None (Preliminary result)   Collection Time: 07/28/24  3:10 AM   Specimen: BLOOD LEFT FOREARM  Result Value Ref Range Status   Specimen Description BLOOD LEFT FOREARM  Final   Special Requests BOTTLES DRAWN AEROBIC AND ANAEROBIC  Final   Culture   Final    NO GROWTH 3 DAYS Performed at [facility], [address] [place], [place] [address]    Report Status PENDING  Incomplete  Culture, blood (Routine x 2)     Status: None (Preliminary result)   Collection Time: 07/28/24  8:21 PM   Specimen: BLOOD  Result Value Ref Range Status   Specimen Description BLOOD SITE NOT SPECIFIED  Final   Special Requests   Final    BOTTLES DRAWN AEROBIC AND ANAEROBIC Blood Culture adequate volume   Culture   Final    NO GROWTH 4 DAYS Performed at [facility], [address] [place], [place] [address]    Report Status PENDING  Incomplete  Urine Culture     Status: None   Collection  Time: 07/28/24  8:21 PM   Specimen: Urine, Random  Result Value Ref Range Status   Specimen Description URINE, RANDOM  Final   Special Requests NONE Reflexed from [id number]  Final   Culture   Final    NO GROWTH Performed at [facility], [address] [place], [place] [address]    Report Status 07/29/2024 FINAL  Final  Resp panel by RT-PCR (  RSV, Flu A&B, Covid) Anterior Nasal Swab     Status: None   Collection Time: 07/28/24 11:30 PM   Specimen: Anterior Nasal Swab  Result Value Ref Range Status   SARS Coronavirus 2 by RT PCR NEGATIVE NEGATIVE Final   Influenza A by PCR NEGATIVE NEGATIVE Final   Influenza B by PCR NEGATIVE NEGATIVE Final    Comment: (NOTE) The Xpert Xpress SARS-CoV-2/FLU/RSV plus assay is intended as an aid in the diagnosis of influenza from Nasopharyngeal swab specimens and should not be used as a sole basis for treatment. Nasal washings and aspirates are unacceptable for Xpert Xpress SARS-CoV-2/FLU/RSV testing.  Fact Sheet for Patients: bloggercourse.com  Fact Sheet for Healthcare Providers: seriousbroker.it  This test is not yet approved or cleared by the [place]  FDA and has been authorized for detection and/or diagnosis of SARS-CoV-2 by FDA under an Emergency Use Authorization (EUA). This EUA will remain in effect (meaning this test can be used) for the duration of the COVID-19 declaration under Section 564(b)(1) of the Act, 21 U.S.C. section 360bbb-3(b)(1), unless the authorization is terminated or revoked.     Resp Syncytial Virus by PCR NEGATIVE NEGATIVE Final    Comment: (NOTE) Fact Sheet for Patients: bloggercourse.com  Fact Sheet for Healthcare Providers: seriousbroker.it  This test is not yet approved or cleared by the [place]  FDA and has been authorized for detection and/or diagnosis of SARS-CoV-2 by FDA under an Emergency Use  Authorization (EUA). This EUA will remain in effect (meaning this test can be used) for the duration of the COVID-19 declaration under Section 564(b)(1) of the Act, 21 U.S.C. section 360bbb-3(b)(1), unless the authorization is terminated or revoked.  Performed at [facility], [address], [place], [place] [address]      Corean Fireman, MSN, NP-C [facility]  [email or web address] Pager: [phone] Office: [phone] [facility] Main Line: [phone] *Secure Chat Communication Welcome  Total Encounter Time: 14 m

## 2024-08-01 NOTE — TOC Initial Note (Addendum)
 Transition of Care [facility]) - Initial/Assessment Note    Patient Details  Name: Ronald Kaiser MRN: [id number] Date of Birth: 1933/11/07  Transition of Care  [facility]) CM/SW Contact:    Sudie Erminio Deems, RN Phone Number: 08/01/2024, 12:49 PM  Clinical Narrative:  Patient presented for progressive weakness and a worsening wound on his back. Patient has a hx of metastatic prostate cancer. PTA patient was from home with spouse. ICM received verbal permission to speak with spouse via phone. Spouse states patient has DME: rollator in the home. ICM received a consult for hospice in the home-spouse asked for [facility]- referral submitted for the staff to review. Spouse states patient would benefit from DME: Hospital Bed, Overbed table, and BSC. Spouse states they can follow to see if he will need oxygen and or wheelchair. No further needs identified at this time.             [phone] 08-01-24  Inpatient Case Manager spoke with spouse and the plan will be for patient to transition home via ambulance.   Expected Discharge Plan: Home w Hospice Care Barriers to Discharge: Continued Medical Work up   Patient Goals and CMS Choice Patient states their goals for this hospitalization and ongoing recovery are:: Plans to return home with Hospice Services.   Choice offered to / list presented to : Spouse (asked for [facility] in [place].)      Expected Discharge Plan and Services In-house Referral: NA Discharge Planning Services: CM Consult Post Acute Care Choice: Skilled Nursing Facility, Hospice Living arrangements for the past 2 months: Single Family Home                           HH Arranged: RN [facility] Agency: [facility] ([facility]) Date [facility] Agency Contacted: 08/01/24 Time HH Agency Contacted: [phone] Representative spoke with at [facility] Agency: Melissa  Prior Living Arrangements/Services Living arrangements for the past 2  months: Single Family Home Lives with:: Spouse Patient language and need for interpreter reviewed:: Yes Do you feel safe going back to the place where you live?: Yes      Need for Family Participation in Patient Care: Yes (Comment) Care giver support system in place?: Yes (comment) Current home services: DME (rollator,) Criminal Activity/Legal Involvement Pertinent to Current Situation/Hospitalization: No - Comment as needed  Activities of Daily Living   ADL Screening (condition at time of admission) Independently performs ADLs?: Yes (appropriate for developmental age) Is the patient deaf or have difficulty hearing?: Yes Does the patient have difficulty seeing, even when wearing glasses/contacts?: Yes Does the patient have difficulty concentrating, remembering, or making decisions?: No  Permission Sought/Granted Permission sought to share information with : Family Supports, Case Production Designer, Theatre/television/film, Oceanographer granted to share information with : Yes, Verbal Permission Granted     Permission granted to share info w AGENCY: Scientist, [employer]        Emotional Assessment Appearance:: Appears stated age Attitude/Demeanor/Rapport: Engaged Affect (typically observed): Appropriate Orientation: : Oriented to Self, Oriented to Place, Oriented to  Time, Oriented to Situation Alcohol / Substance Use: Not Applicable Psych Involvement: No (comment)  Admission diagnosis:  Pulmonary embolism (HCC) [I26.99] Tachycardia [R00.0] Abdominal wall fluid collections [R18.8] Acute pulmonary embolism (HCC) [I26.99] Chronic multifocal osteomyelitis, unspecified site (HCC) [M86.30] Multiple subsegmental pulmonary emboli without acute cor pulmonale (HCC) [I26.94] Patient Active Problem List   Diagnosis Date Noted   Chronic multifocal  osteomyelitis (HCC) 08/01/2024   Adult failure to thrive 08/01/2024   Hypotension 07/31/2024   Osteomyelitis of pelvic region [facility]) 07/30/2024    Fistula 07/30/2024   Abscess of abdominal wall 07/30/2024   Prostate cancer metastatic to multiple sites (HCC) 07/30/2024   Pressure injury of skin 07/29/2024   Acute pulmonary embolism (HCC) 07/28/2024   Protein-calorie malnutrition, severe 07/19/2024   Abdominal wall abscess 07/11/2024   History of CAD (coronary artery disease) 07/11/2024   HFrEF (heart failure with reduced ejection fraction) (HCC) 07/11/2024   Sepsis (HCC) 07/11/2024   Elevated troponin 07/11/2024   Iron  deficiency anemia, unspecified 06/11/2024   Dizziness 06/07/2024   Paroxysmal atrial fibrillation (HCC) 05/30/2024   Radiation proctitis 05/28/2024   Gastric erythema 05/28/2024   Rectal bleeding 05/27/2024   Iron  deficiency anemia 05/27/2024   Symptomatic anemia 05/26/2024   Prostate cancer metastatic to bone (HCC) 02/26/2021   Bladder outlet obstruction 11/27/2018   Acute kidney injury    Hypokalemia    Other hydronephrosis    Chronic systolic CHF (congestive heart failure) (HCC) 11/11/2018   AKI (acute kidney injury) 11/11/2018   Basal cell carcinoma (BCC) of skin of nose 01/24/2018   Abnormal nuclear cardiac imaging test    Actinic keratoses 01/13/2016   CHF (congestive heart failure) (HCC) 01/13/2016   Male erectile disorder 01/13/2016   Osteoarthritis 01/13/2016   Seborrheic keratoses 01/13/2016   Bruit 11/23/2012   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 ANNA MULLIGAN [place] [address] Phone: [phone] Fax: [phone]  [facility] - [place], [place] - [address] [place] [address] Phone: [phone] Fax: [phone]  [facility] [address] [place] [address] Phone: [phone] Fax: [phone]     Social Drivers of Health (SDOH) Social History: SDOH Screenings   Food Insecurity: No Food Insecurity (07/29/2024)  Housing: Low Risk  (07/29/2024)  Transportation Needs: No Transportation Needs (07/29/2024)  Utilities: Not At Risk (07/29/2024)  Social Connections: Patient Declined (07/29/2024)  Recent Concern: Social Connections - Moderately Isolated (06/07/2024)  Tobacco Use: Medium Risk (07/29/2024)   SDOH Interventions:     Readmission Risk Interventions    07/30/2024    7:35 PM 07/19/2024    6:07 PM 06/10/2024   10:37 AM  Readmission Risk Prevention Plan  Transportation Screening Complete Complete Complete  PCP or Specialist Appt within 3-5 Days   Complete  HRI or Home Care Consult   Complete  Palliative Care Screening   Not Applicable  Medication Review (RN Care Manager) Complete Complete Complete  PCP or Specialist appointment within 3-5 days of discharge Complete Complete   HRI or Home Care Consult Complete Complete   SW Recovery Care/Counseling Consult Complete Complete   Palliative Care Screening Not Applicable Not Applicable   Skilled Nursing Facility  Patient Refused

## 2024-08-01 NOTE — Telephone Encounter (Signed)
 Left message for patients wife of dr vertie recommendations.

## 2024-08-01 NOTE — Progress Notes (Signed)
 Occupational Therapy Treatment Patient Details Name: Ronald Kaiser MRN: [id number] DOB: 1934/04/20 Today's Date: 08/01/2024   History of present illness Ronald Kaiser is a 89 y.o. male presented with progressive weakness, and per his report home RN concern for worsening wound on his back. PMH: metastatic prostate cancer, hx TURP, on ADT, hx radiation therapy c/b radiation proctitis, and recent complex hospitalization 10/8-21 with pubic symphysis osteomyelitis, as well as fistulous tract between the bladder and the lower anterior abdominal wall abscess, with Ucx and aspiration of the abd wall abscess by IR both + Proteus Mirabilis, felt to not be a surgical candidate, PICC line placed and was discharged with plan for 6 weeks of CTX and possible suppressive abx thereafter; additional hx includes CAD, HFrEF, Afib not on AC, orthostatic hypotension, anemia.   OT comments  Pt has met OT functional goal of transferring to commode with Mod I with rollator. Pt is currently demonstrating at baseline abilities and does not indicate a need for continued skilled OT services. As all goals have been met, OT to sign-off.       If plan is discharge home, recommend the following:  A little help with walking and/or transfers;A little help with bathing/dressing/bathroom;Assist for transportation;Help with stairs or ramp for entrance   Equipment Recommendations  None recommended by OT    Recommendations for Other Services      Precautions / Restrictions Precautions Precautions: Fall Recall of Precautions/Restrictions: Intact Restrictions Weight Bearing Restrictions Per Provider Order: No       Mobility Bed Mobility Overal bed mobility: Modified Independent             General bed mobility comments: Pt able to transition to/from EOB without assistance. Required increased time and use of bed rails.    Transfers Overall transfer level: Modified independent Equipment used: Rollator (4 wheels)                General transfer comment: Pt transfered to and from commode in bathroom with Mod I and use of rollator. Pt engaged in toilet transfers with Mod I and safe use of rollator and grab bars.     Balance Overall balance assessment: Mild deficits observed, not formally tested                                         ADL either performed or assessed with clinical judgement   ADL Overall ADL's : Needs assistance/impaired                 Upper Body Dressing : Set up   Lower Body Dressing: Set up   Toilet Transfer: Modified Independent   Toileting- Clothing Manipulation and Hygiene: Moderate assistance;Sit to/from stand         General ADL Comments: Pt greeted in bed with bowel movement. Posterior peri care completed in bed with rolling. Pt ambulated to commode in bathroom with Mod I with rollator. Pt with active bowel movement during ambulation and continued on toilet. Pt required Mod A for posterior peri care in standing at commode. Pt able to don/doff socks with set-up A while seated on commode and don gown.    Extremity/Trunk Assessment Upper Extremity Assessment Upper Extremity Assessment: Generalized weakness            Vision   Vision Assessment?: No apparent visual deficits   Perception     Praxis  Communication Communication Communication: Impaired Factors Affecting Communication: Hearing impaired   Cognition Arousal: Alert Behavior During Therapy: WFL for tasks assessed/performed Cognition: No apparent impairments                               Following commands: Intact        Cueing   Cueing Techniques: Verbal cues  Exercises      Shoulder Instructions       General Comments Pt pleasant and agreeable to OT sesssion this date. Pt with uncontrolled bowel movements during session. Primary nurse and NT made aware.    Pertinent Vitals/ Pain       Pain Assessment Pain Assessment: Faces Faces Pain  Scale: Hurts a little bit Pain Location: Back Pain Descriptors / Indicators: Discomfort Pain Intervention(s): Monitored during session, Repositioned  Home Living                                          Prior Functioning/Environment              Frequency           Progress Toward Goals  OT Goals(current goals can now be found in the care plan section)  Progress towards OT goals: Goals met/education completed, patient discharged from OT  Acute Rehab OT Goals Patient Stated Goal: to go home OT Goal Formulation: With patient Time For Goal Achievement: 08/05/24 Potential to Achieve Goals: Good ADL Goals Pt Will Transfer to Toilet: with modified independence;regular height toilet;ambulating  Plan      Co-evaluation                 AM-PAC OT 6 Clicks Daily Activity     Outcome Measure   Help from another person eating meals?: None Help from another person taking care of personal grooming?: None Help from another person toileting, which includes using toliet, bedpan, or urinal?: A Lot Help from another person bathing (including washing, rinsing, drying)?: A Little Help from another person to put on and taking off regular upper body clothing?: None Help from another person to put on and taking off regular lower body clothing?: None 6 Click Score: 21    End of Session Equipment Utilized During Treatment: Rollator (4 wheels)  OT Visit Diagnosis: Unsteadiness on feet (R26.81);Other (comment)   Activity Tolerance Patient tolerated treatment well   Patient Left in bed;with call bell/phone within reach   Nurse Communication Other (comment) (Need for cleaning as Pt reported active BM at end of session)        Time: 8566-8471 OT Time Calculation (min): 55 min  Charges: OT General Charges $OT Visit: 1 Visit OT Treatments $Self Care/Home Management : 53-67 mins  Maurilio CROME, OTR/LSABRA  [facility] Acute Rehabilitation  Office: [phone]   Maurilio PARAS  Navreet Bolda 08/01/2024, 3:42 PM

## 2024-08-01 NOTE — Progress Notes (Signed)
  Progress Note   Patient: Ronald Kaiser FMW:[id number] DOB: 12/19/1933 DOA: 07/28/2024     4 DOS: the patient was seen and examined on 08/01/2024 at 11:35AM      Brief hospital course: 89 y.o. M with CAD, prosCA metastatic to Bone on Xtandi , history Afib, history radiation proctitis, sCHF EF 25-30%, recent admission for septic shock due to pelvic osteomyelitis and abdominal wall abscess with bladder fistula who returned with malaise, found to have new PE, possible PICC infection.   ID consulted, started on heparin  for PE.         Assessment and Plan: * Acute pulmonary embolism (HCC) DVT of the right leg See summary from 10/29.  Given minimal burden, risk of bleeding and anemia, anticoagulation is stopped.  Clinically stable today.   Chronic systolic CHF (congestive heart failure) (HCC) EF 25-30%.  Appears euvolemic.  No longer on diuretics.  Hypotension - Continue midodrine   Prostate cancer metastatic to multiple sites [facility]) - Can resume Xtandi  at discharge per Hospice  Osteomyelitis of pelvic region [facility]) Abscess of anterior abdominal wall Fistula to bladder from abscess See summary from yesterday - Continue antibiotics, ID recommend trasntiion to amoxicillin/Flagyl  Protein-calorie malnutrition, severe - Continue nutrition supplements  Paroxysmal atrial fibrillation (HCC) Not on anticoagulation  Coronary artery disease - Continue pravastatin           Subjective: Patient feels comfortable.  He has had no new clinical changes, no respiratory symptoms, no hemodynamic instability.     Physical Exam: BP (!) 106/52   Pulse 87   Temp 97.9 F (36.6 C)   Resp 13   Ht 5' 11 (1.803 m)   Wt 72.4 kg   SpO2 95%   BMI 22.26 kg/m   Thin elderly adult male, lying in bed, hard of hearing RRR, no murmurs, no peripheral edema Respiratory rate normal, lungs clear without rales or wheezes Abdomen soft no tenderness palpation or guarding, no ascites or  distention Tension normal, affect normal, judgment and insight appear normal    Data Reviewed: Basic metabolic panel shows normal electrolytes and renal function CBC shows stable anemia     Family Communication: Son and daughter by phone  Disposition: Status is: Inpatient         Author: Lonni SHAUNNA Dalton, MD 08/01/2024 4:20 PM  For on call review [email or web address].

## 2024-08-02 ENCOUNTER — Other Ambulatory Visit (HOSPITAL_COMMUNITY): Payer: Self-pay

## 2024-08-02 DIAGNOSIS — I2699 Other pulmonary embolism without acute cor pulmonale: Secondary | ICD-10-CM | POA: Diagnosis not present

## 2024-08-02 LAB — CULTURE, BLOOD (ROUTINE X 2)
Culture: NO GROWTH
Special Requests: ADEQUATE

## 2024-08-02 MED ORDER — ONDANSETRON HCL 4 MG PO TABS
4.0000 mg | ORAL_TABLET | Freq: Three times a day (TID) | ORAL | 0 refills | Status: AC | PRN
  Filled 2024-08-02: qty 20, 7d supply, fill #0

## 2024-08-02 MED ORDER — MEDIHONEY WOUND/BURN DRESSING EX GEL
1.0000 | Freq: Every day | CUTANEOUS | 0 refills | Status: AC
  Filled 2024-08-02: qty 44, 44d supply, fill #0

## 2024-08-02 NOTE — Progress Notes (Addendum)
 Mobility Specialist Progress Note:    08/02/24 0942  Mobility  Activity  (Bed mobility)  Range of Motion/Exercises Active;Right leg;Left leg  Activity Response Tolerated well  Mobility Referral Yes  Mobility visit 1 Mobility  Mobility Specialist Start Time (ACUTE ONLY) [id number]  Mobility Specialist Stop Time (ACUTE ONLY) 0934  Mobility Specialist Time Calculation (min) (ACUTE ONLY) 10 min   Received pt in bed and refused OOB d/t discharging today, despite max encouragement. Pt completed x10 BLE leg lifts and ankle pumps. Pt returned supine in bed. Bed alarm on. Personal belongings and call light within reach. All needs met.  Lavanda Pollack Mobility Specialist  Please contact via [employer] or  Rehab Office [phone]

## 2024-08-02 NOTE — Discharge Planning (Signed)
 Patient alert. IV access removed. Discharge teaching given to patient. Patient verbalized understanding of teaching including medications. Discharge summary placed in discharge packet. Packet placed in the drawer for PTAR. Patient will be transported home via [facility]tar.

## 2024-08-02 NOTE — Discharge Summary (Signed)
 Physician Discharge Summary   Patient: Ronald Kaiser MRN: [id number] DOB: 02/04/1934  Admit date:     07/28/2024  Discharge date: 08/02/24  Discharge Physician: Lonni SHAUNNA Dalton   PCP: [name], [name]     Recommendations at discharge:  Discharged to Hospice Continue Amoxicillin/Flagyl x1 month then reduce to Augmentin indefinitely for suppression     Discharge Diagnoses: Principal Problem:   Acute pulmonary embolism [facility]) Active Problems:   Metastatic prostate cancer   Bladder fistula   Abscess of abdominal wall   Osteomyelitis of the pelvis   Chronic systolic CHF (congestive heart failure) (HCC)   Coronary artery disease   Paroxysmal atrial fibrillation (HCC)   Protein-calorie malnutrition, severe   Pressure injury of skin   Hypotension   Chronic multifocal osteomyelitis (HCC)   Adult failure to thrive       Hospital Course: 89 y.o. M with CAD, prosCA metastatic to Bone on Xtandi , history Afib, history radiation proctitis, sCHF EF 25-30%, recent admission for septic shock due to pelvic osteomyelitis and abdominal wall abscess with bladder fistula who returned with malaise, found to have new PE, possible PICC infection.   Previously admitted and found to have a bladder fistula to an abdominal wall abscess with pelvic osteomyelitis.  This was aspirated, grew Proteus and Aerococcus and he was sent home with PICC and Rocephin , but returned within a week with constitutional symptoms, subjective fever, and SOB. CTA showed PE and so he was admitted, ID consulted.     * Osteomyelitis of pelvic region [facility]) Abscess of anterior abdominal wall Fistula to bladder from abscess Admitted and PICC line removed.  Continued on antibitoics and ID consulted.  A TTE showed possible vegetation, but blood cultures were negative.  Subsequently, in further discussions, given the high degree of certainty the the accumulating set of medical problems were not curable, patient met  with medical team and transitioned to less invasive approach, plan to discharge with hospice.    Discharged on Palliative antibiotics, with hospice following.    Acute pulmonary embolism (HCC) DVT of the right leg Discussed with IR.  These PEs are very small, of indeterminate age.  The ultrasound shows a calf vein and small mid thigh DVT, again, very small burden.  Given his anemia, FTT, and prior proctitis, systemic anticoagulation would almost certainly lead to symptomatic anemia within days to weeks and lead to more suffering than likely benefit. See previous documentation for full discussion, patient in agreement with plan.  Chronic systolic CHF (congestive heart failure) (HCC) EF 25-30%.  Appears euvolemic.  No longer on diuretics.  Hypotension On midodrine .  Prostate cancer metastatic to multiple sites [facility]) Defer Xtandi  resumption to Palliative Care, Urology  Pressure injury of skin Unstageable, mid back, present on admission  Protein-calorie malnutrition, severe  Paroxysmal atrial fibrillation (HCC) Not on anticoagulation  Coronary artery disease            The [place]  Controlled Substances Registry was reviewed for this patient prior to discharge.  Consultants: Palliative Care Urology Infectious Disease  Procedures performed:  TTE   Disposition: Hospice care   DISCHARGE MEDICATION: Allergies as of 08/02/2024       Reactions   Bactrim [sulfamethoxazole-trimethoprim] Hives, Shortness Of Breath, Swelling   Augmentin [amoxicillin-pot Clavulanate] Diarrhea   Crestor  [rosuvastatin  Calcium ] Other (See Comments)   Back pain Grogginess  Dizziness    Zetia  [ezetimibe ] Other (See Comments)   Back pain Grogginess  Dizziness  Medication List     STOP taking these medications    CALCIUM  + VITAMIN D3 PO   cefTRIAXone  IVPB Commonly known as: ROCEPHIN    Centrum Silver Men 50+ Tabs   nitroGLYCERIN  0.4 MG SL tablet Commonly known as:  NITROSTAT    relugolix 120 MG tablet Commonly known as: ORGOVYX   vitamin E 180 MG (400 UNITS) capsule       TAKE these medications    acetaminophen  325 MG tablet Commonly known as: TYLENOL  Take 2 tablets (650 mg total) by mouth every 6 (six) hours as needed for mild pain (pain score 1-3) or fever (or Fever >/= 101).   amoxicillin 500 MG capsule Commonly known as: AMOXIL Take 2 capsules (1,000 mg total) by mouth 3 (three) times daily.   enzalutamide  80 MG tablet Commonly known as: Xtandi  Take 2 tablets (160 mg total) by mouth daily. Resume home meds   feeding supplement Liqd Take 237 mLs by mouth 3 (three) times daily between meals.   ferrous sulfate  325 (65 FE) MG EC tablet Take 325 mg by mouth daily with breakfast.   leptospermum manuka honey gel Apply 1 Application topically daily.   metroNIDAZOLE 500 MG tablet Commonly known as: FLAGYL Take 1 tablet (500 mg total) by mouth 2 (two) times daily.   midodrine  10 MG tablet Commonly known as: PROAMATINE  Take 1 tablet (10 mg total) by mouth 3 (three) times daily with meals.   ondansetron  4 MG tablet Commonly known as: ZOFRAN  Take 1 tablet (4 mg total) by mouth every 8 (eight) hours as needed for nausea or vomiting.   pantoprazole  40 MG tablet Commonly known as: PROTONIX  Take 1 tablet (40 mg total) by mouth daily.   pentoxifylline 400 MG CR tablet Commonly known as: TRENTAL Take 1 tablet (400 mg total) by mouth 3 (three) times daily with meals.   pravastatin  20 MG tablet Commonly known as: PRAVACHOL  Take 1 tablet (20 mg total) by mouth every evening.               Discharge Care Instructions  (From admission, onward)           Start     Ordered   08/02/24 0000  Discharge wound care:       Comments: Apply Medihoney to wound bed then cover/fill the wound with moist gauze Top the moist gauze with a simple dressing or foam dressing Change daily   08/02/24 1214            Follow-up Information      [name], [name] Follow up.   Specialties: Family Medicine, Physician Assistant Contact information: [address] [place] [address] [phone]         [facility] Follow up.   Why: Cytogeneticist information: [address] [place] [address] [phone]                 Discharge Instructions     Discharge instructions   Complete by: As directed    For the infection: Take amoxicillin 1g three times daily and metronidazole/Flagyl 500 mg twice daily Take Flagyl with food and use ondansetron  4 mg tablets if it causes nausea  STOP ceftriaxone  (the intravenous antibiotic)  You may resume any of your other previous medicines or supplements that you like You SHOULD continue midodrine , because this keeps blood pressure up, and I suspect you won't feel well if your blood pressure is low  Medicines like Xtandi  and Orgovyx, discuss with  the [facility] team whether these match your overall goals and whether they think they will help quality of life or not  See below for wound care instructions   Discharge wound care:   Complete by: As directed    Apply Medihoney to wound bed then cover/fill the wound with moist gauze Top the moist gauze with a simple dressing or foam dressing Change daily   Increase activity slowly   Complete by: As directed        Discharge Exam: Filed Weights   07/28/24 2015 07/30/24 0500 07/31/24 0500  Weight: 67 kg 74.1 kg 72.4 kg    General: Pt is alert, awake, not in acute distress Cardiovascular: RRR, nl S1-S2, no murmurs appreciated.   No LE edema.   Respiratory: Normal respiratory rate and rhythm.  CTAB without rales or wheezes. Abdominal: Abdomen soft and non-tender.  No distension or HSM.   Neuro/Psych: Strength symmetric in upper and lower extremities.  Judgment and insight appear normal.   Condition at discharge: stable  The results of significant diagnostics from this  hospitalization (including imaging, microbiology, ancillary and laboratory) are listed below for reference.   Imaging Studies: VAS US  LOWER EXTREMITY VENOUS (DVT) Result Date: 07/30/2024  Lower Venous DVT Study Patient Name:  Ronald Kaiser  Date of Exam:   07/30/2024 Medical Rec #: [id number]       Accession #:    [id number] Date of Birth: 1933/12/07      Patient Gender: M Patient Age:   89 years Exam Location:  [facility] Procedure:      VAS US  LOWER EXTREMITY VENOUS (DVT) Referring Phys: DORN DAWSON --------------------------------------------------------------------------------  Indications: Pulmonary embolism. Other Indications: H/O cancer. Comparison Study: No prior exam. Performing Technologist: Edilia Elden Appl  Examination Guidelines: A complete evaluation includes B-mode imaging, spectral Doppler, color Doppler, and power Doppler as needed of all accessible portions of each vessel. Bilateral testing is considered an integral part of a complete examination. Limited examinations for reoccurring indications may be performed as noted. The reflux portion of the exam is performed with the patient in reverse Trendelenburg.  +---------+---------------+---------+-----------+----------+-----------------+ RIGHT    CompressibilityPhasicitySpontaneityPropertiesThrombus Aging    +---------+---------------+---------+-----------+----------+-----------------+ CFV      Full           Yes      Yes                                    +---------+---------------+---------+-----------+----------+-----------------+ SFJ      Full           Yes      Yes                                    +---------+---------------+---------+-----------+----------+-----------------+ FV Prox  Full                                                           +---------+---------------+---------+-----------+----------+-----------------+ FV Mid   Full                                                            +---------+---------------+---------+-----------+----------+-----------------+  FV DistalFull                                                           +---------+---------------+---------+-----------+----------+-----------------+ PFV      Partial        Yes      Yes                  Age Indeterminate +---------+---------------+---------+-----------+----------+-----------------+ POP      Full           Yes      Yes                                    +---------+---------------+---------+-----------+----------+-----------------+ PTV      Full                                                           +---------+---------------+---------+-----------+----------+-----------------+ PERO     Full                                                           +---------+---------------+---------+-----------+----------+-----------------+ Soleal   None           No       No                   Acute             +---------+---------------+---------+-----------+----------+-----------------+ Deep vein trombosis noted in the right profunda vein and soleal vein.  +---------+---------------+---------+-----------+----------+--------------+ LEFT     CompressibilityPhasicitySpontaneityPropertiesThrombus Aging +---------+---------------+---------+-----------+----------+--------------+ CFV      Full           Yes      Yes                                 +---------+---------------+---------+-----------+----------+--------------+ SFJ      Full           Yes      Yes                                 +---------+---------------+---------+-----------+----------+--------------+ FV Prox  Full                                                        +---------+---------------+---------+-----------+----------+--------------+ FV Mid   Full                                                        +---------+---------------+---------+-----------+----------+--------------+  FV  DistalFull                                                        +---------+---------------+---------+-----------+----------+--------------+ PFV      Full                                                        +---------+---------------+---------+-----------+----------+--------------+ POP      Full           Yes      Yes                                 +---------+---------------+---------+-----------+----------+--------------+ PTV      Full                                                        +---------+---------------+---------+-----------+----------+--------------+ PERO     Full                                                        +---------+---------------+---------+-----------+----------+--------------+     Summary: RIGHT: - Findings consistent with acute deep vein thrombosis involving the right proximal profunda vein. Findings consistent with acute intramuscular thrombosis involving the right soleal veins. - No cystic structure found in the popliteal fossa.  LEFT: - There is no evidence of deep vein thrombosis in the lower extremity.  - No cystic structure found in the popliteal fossa.  *See table(s) above for measurements and observations. Electronically signed by Gaile New MD on 07/30/2024 at 9:07:01 PM.    Final    ECHOCARDIOGRAM COMPLETE Result Date: 07/29/2024    ECHOCARDIOGRAM REPORT   Patient Name:   Ronald Kaiser Date of Exam: 07/29/2024 Medical Rec #:  [id number]      Height:       71.0 in Accession #:    [id number]     Weight:       147.7 lb Date of Birth:  1934-09-21     BSA:          1.854 m Patient Age:    89 years       BP:           101/59 mmHg Patient Gender: M              HR:           74 bpm. Exam Location:  Inpatient Procedure: 2D Echo and Intracardiac Opacification Agent (Both Spectral and Color            Flow Doppler were utilized during procedure). Indications:     Pulmonary Embolus  History:         Patient has prior history of  Echocardiogram examinations. CHF,  CAD; Risk Factors:Hypertension.  Sonographer:     Charmaine Gaskins Referring Phys:  [id number] DORN DAWSON Diagnosing Phys: Georganna Archer IMPRESSIONS  1. Left ventricular ejection fraction, by estimation, is 25 to 30%. Left ventricular ejection fraction by PLAX is 26 %. The left ventricle has severely decreased function. The left ventricle demonstrates regional wall motion abnormalities (see scoring diagram/findings for description). The left ventricular internal cavity size was mildly dilated. Left ventricular diastolic parameters are consistent with Grade I diastolic dysfunction (impaired relaxation).  2. Right ventricular systolic function is normal. The right ventricular size is normal. There is normal pulmonary artery systolic pressure.  3. Left atrial size was moderately dilated.  4. The mitral valve is grossly normal. Mild mitral valve regurgitation. No evidence of mitral stenosis.  5. There is moderate thickening of the non-coronary cusp of the aortic valve with independently mobile echodensities attached. The thickening of the aortic valve is chronic; however, the mobile echodensities appear to be new. These can represent either infective endocarditis vs degenerative changes depending on the clinical context. The aortic valve is tricuspid. Aortic valve regurgitation is mild. Aortic valve sclerosis/calcification is present, without any evidence of aortic stenosis.  6. Aortic dilatation noted. There is mild dilatation of the aortic root, measuring 41 mm.  7. The inferior vena cava is normal in size with greater than 50% respiratory variability, suggesting right atrial pressure of 3 mmHg. Comparison(s): LVEF appears stable from prior. Echodensities on aortic valve. Conclusion(s)/Recommendation(s): 1. Severe LV systolic dysfunction with LVEF = 25-30%, largely unchanged from prior. 2. Normal RV size and function. 3. Independently mobile echodensities on the NCC  of the aortic valve concerning for possible infective endocarditis vs. degenerative changes. 4. Recommend a TEE to further evaluate aortic valve if clinically indicated. 5. Recommend repeat imaging in 1 year to ensure stability of his dilated aortic root. FINDINGS  Left Ventricle: Left ventricular ejection fraction, by estimation, is 25 to 30%. Left ventricular ejection fraction by PLAX is 26 %. The left ventricle has severely decreased function. The left ventricle demonstrates regional wall motion abnormalities. Definity  contrast agent was given IV to delineate the left ventricular endocardial borders. The left ventricular internal cavity size was mildly dilated. There is no left ventricular hypertrophy. Left ventricular diastolic parameters are consistent with Grade I diastolic dysfunction (impaired relaxation).  LV Wall Scoring: The mid and distal anterior wall, mid and distal anterior septum, entire apex, and mid inferoseptal segment are akinetic. The antero-lateral wall, inferior wall, posterior wall, basal anteroseptal segment, basal anterior segment, and basal inferoseptal segment are hypokinetic. Right Ventricle: The right ventricular size is normal. No increase in right ventricular wall thickness. Right ventricular systolic function is normal. There is normal pulmonary artery systolic pressure. The tricuspid regurgitant velocity is 2.11 m/s, and  with an assumed right atrial pressure of 3 mmHg, the estimated right ventricular systolic pressure is 20.8 mmHg. Left Atrium: Left atrial size was moderately dilated. Right Atrium: Right atrial size was normal in size. Pericardium: There is no evidence of pericardial effusion. Mitral Valve: The mitral valve is grossly normal. Mild mitral valve regurgitation. No evidence of mitral valve stenosis. Tricuspid Valve: The tricuspid valve is normal in structure. Tricuspid valve regurgitation is mild . No evidence of tricuspid stenosis. Aortic Valve: There is moderate  thickening of the non-coronary cusp of the aortic valve with independently mobile echodensities attached. The thickening of the aortic valve is chronic; however, the mobile echodensities appear to be new. These can represent either infective endocarditis vs degenerative  changes depending on the clinical context. The aortic valve is tricuspid. Aortic valve regurgitation is mild. Aortic valve sclerosis/calcification is present, without any evidence of aortic stenosis. Aortic valve mean gradient measures 5.0 mmHg. Aortic valve peak gradient measures 7.0 mmHg. Aortic valve area, by VTI measures 3.44 cm. Pulmonic Valve: The pulmonic valve was normal in structure. Pulmonic valve regurgitation is not visualized. No evidence of pulmonic stenosis. Aorta: Aortic dilatation noted. There is mild dilatation of the aortic root, measuring 41 mm. Venous: The inferior vena cava is normal in size with greater than 50% respiratory variability, suggesting right atrial pressure of 3 mmHg. IAS/Shunts: No atrial level shunt detected by color flow Doppler.  LEFT VENTRICLE PLAX 2D LV EF:         Left            Diastology                ventricular     LV e' medial:    6.20 cm/s                ejection        LV E/e' medial:  8.9                fraction by     LV e' lateral:   8.81 cm/s                PLAX is 26      LV E/e' lateral: 6.2                %. LVIDd:         4.90 cm LVIDs:         4.30 cm LV PW:         0.60 cm LV IVS:        0.60 cm LVOT diam:     2.40 cm LV SV:         106 LV SV Index:   57 LVOT Area:     4.52 cm LV IVRT:       169 msec  RIGHT VENTRICLE RV Basal diam:  2.70 cm     PULMONARY VEINS RV Mid diam:    2.50 cm     Diastolic Velocity: 35.10 cm/s RV S prime:     14.00 cm/s  S/D Velocity:       1.30                             Systolic Velocity:  46.40 cm/s LEFT ATRIUM             Index        RIGHT ATRIUM           Index LA diam:        3.80 cm 2.05 cm/m   RA Area:     14.50 cm LA Vol (A2C):   87.1 ml 46.99 ml/m   RA Volume:   28.70 ml  15.48 ml/m LA Vol (A4C):   88.1 ml 47.53 ml/m LA Biplane Vol: 87.1 ml 46.99 ml/m  AORTIC VALVE AV Area (Vmax):    3.26 cm AV Area (Vmean):   3.09 cm AV Area (VTI):     3.44 cm AV Vmax:           132.00 cm/s AV Vmean:          103.000 cm/s AV VTI:  0.308 m AV Peak Grad:      7.0 mmHg AV Mean Grad:      5.0 mmHg LVOT Vmax:         95.00 cm/s LVOT Vmean:        70.300 cm/s LVOT VTI:          0.234 m LVOT/AV VTI ratio: 0.76  AORTA Ao Root diam: 4.10 cm Ao Asc diam:  3.60 cm MITRAL VALVE               TRICUSPID VALVE MV Area (PHT): 3.58 cm    TR Peak grad:   17.8 mmHg MV Decel Time: 212 msec    TR Vmax:        211.00 cm/s MV E velocity: 55.00 cm/s MV A velocity: 90.70 cm/s  SHUNTS MV E/A ratio:  0.61        Systemic VTI:  0.23 m                            Systemic Diam: 2.40 cm Georganna Archer Electronically signed by Georganna Archer Signature Date/Time: 07/29/2024/10:38:06 AM    Final (Updated)    CT ABDOMEN PELVIS W CONTRAST Result Date: 07/28/2024 EXAM: CT ABDOMEN AND PELVIS WITH CONTRAST 07/28/2024 09:27:51 PM TECHNIQUE: CT of the abdomen and pelvis was performed with the administration of 75 mL of iohexol  (OMNIPAQUE ) 350 MG/ML injection. Multiplanar reformatted images are provided for review. Automated exposure control, iterative reconstruction, and/or weight-based adjustment of the mA/kV was utilized to reduce the radiation dose to as low as reasonably achievable. COMPARISON: 07/17/2024 CLINICAL HISTORY: Eval for increasing size of abdominal wall abscess. Patient BIB EMS for code sepsis. Patient reportedly admitted several days ago for leaking bowel. Patient reports he was discharged with PICC line for IV Rocephin  at home. FINDINGS: LOWER CHEST: No acute abnormality. LIVER: The liver is unremarkable. GALLBLADDER AND BILE DUCTS: Gallbladder is unremarkable. No biliary ductal dilatation. SPLEEN: No acute abnormality. PANCREAS: Parenchymal calcifications along the  pancreaticoduodenal groove (image 40), chronic. ADRENAL GLANDS: No acute abnormality. KIDNEYS, URETERS AND BLADDER: No stones in the kidneys or ureters. No hydronephrosis. No perinephric or periureteral stranding. Foley catheter with indwelling contrast within a decompressed, thick-walled bladder. GI AND BOWEL: Stomach demonstrates no acute abnormality. There is no bowel obstruction. Moderate colonic stool burden. PERITONEUM AND RETROPERITONEUM: No ascites. No free air. VASCULATURE: Abdominal aortic atherosclerosis. Inferior abdominal aorta measures 2.8 cm, within normal limits. Consider follow-up ultrasound in 5 years, if clinically warranted. LYMPH NODES: No lymphadenopathy. REPRODUCTIVE ORGANS: No acute abnormality. BONES AND SOFT TISSUES: Destructive changes involving the parasympathetic region, left greater than right, suggesting osteomyelitis/septic joint. 4.0 x 8.3 cm fluid and gas collection within the midline lower pelvic wall (image 73), similar to the prior, although now with increased gas and decreased fluid. Inferiorly, there is phlegmonous change within the left lower perineum (image 84), difficult to discretely measure but similar to the prior, with trace residual contrast (image 82), improved. IMPRESSION: 1. Thick-walled bladder, decompressed with Foley catheter and indwelling contrast. 2. Pelvic wall fluid and gas collection measuring 4.0 x 8.3 cm, similar in size to prior, with increased gas and decreased fluid. 3. Left lower perineal phlegmon, overall similar with decreased residual contrast. 4. Osteomyelitis/septic joint involving the pubic symphysis, stable. Electronically signed by: Pinkie Pebbles MD 07/28/2024 09:45 PM EDT RP Workstation: [id number]   CT Angio Chest PE W and/or Wo Contrast Result Date: 07/28/2024 EXAM: CTA of the Chest with  contrast for PE 07/28/2024 09:27:51 PM TECHNIQUE: CTA of the chest was performed after the administration of intravenous contrast. Multiplanar  reformatted images are provided for review. MIP images are provided for review. Automated exposure control, iterative reconstruction, and/or weight based adjustment of the mA/kV was utilized to reduce the radiation dose to as low as reasonably achievable. COMPARISON: CT of the chest 07/10/2024. CLINICAL HISTORY: PE workup. Patient BIB EMS for code sepsis. Patient reportedly admitted several days ago for leaking bowel. Patient reports he was discharged with PICC line for IV Rocephin  at home. FINDINGS: PULMONARY ARTERIES: Pulmonary arteries are adequately opacified for evaluation. Segmental right lower lobe pulmonary emboli are present. Segmental pulmonary emboli in the left upper lobe. Main pulmonary artery is normal in caliber. MEDIASTINUM: The heart and pericardium demonstrate no acute abnormality. There are atherosclerotic calcifications of the aorta and coronary arteries. There is no acute abnormality of the thoracic aorta. LYMPH NODES: No mediastinal, hilar or axillary lymphadenopathy. LUNGS AND PLEURA: Patchy ground-glass and interstitial opacities are seen in the bilateral lower lobes. There is some atelectasis in the lingula. No pleural effusion or pneumothorax. UPPER ABDOMEN: Limited images of the upper abdomen are unremarkable. SOFT TISSUES AND BONES: Sclerotic density in the left seventh rib appears unchanged. Sclerotic density in the posterior right eighth rib appears unchanged. There are healed posterior inferior right rib fractures. T11 and T12 compression deformities are unchanged. There is marked retropulsion of fracture fragments at T12, unchanged. Sclerotic densities in T10 and L1 are unchanged. No acute soft tissue abnormality. IMPRESSION: 1. Acute segmental pulmonary emboli in the right lower lobe and left upper lobe. No evidence for right heart strain. 2. Patchy ground-glass and interstitial opacities in the bilateral lower lobes, which may reflect infection/inflammation or edema. Lingular  atelectasis. 3. Atherosclerotic calcifications of the aorta and coronary arteries. 4. Stable sclerotic metastatic osseous lesions. Electronically signed by: Greig Pique MD 07/28/2024 09:40 PM EDT RP Workstation: [id number]   DG Chest Port 1 View if patient is in a treatment room. Result Date: 07/28/2024 EXAM: 1 VIEW XRAY OF THE CHEST 07/28/2024 08:46:00 PM COMPARISON: Compared to May 10, 2024. CLINICAL HISTORY: Suspected Sepsis. Table formatting from the original note was not included.; Suspected Sepsis Suspected Sepsis. Table formatting from the original note was not included.; Suspected Sepsis FINDINGS: LINES, TUBES AND DEVICES: Right upper extremity PICC line is seen within the superior vena cava. LUNGS AND PLEURA: Patchy infiltrate is seen within the left mid lung zone. There is increasing streaky infiltrate within the left lung base, possibly infectious in the acute setting. The right lung is clear, with sacral linear atelectasis of the right lung base. No pneumothorax or pleural effusion. HEART AND MEDIASTINUM: Cardiac size is within normal limits. BONES AND SOFT TISSUES: No acute osseous abnormality. IMPRESSION: 1. Patchy infiltrate in the left mid lung zone and increasing streaky infiltrate in the left lung base, possibly infectious in the acute setting. Follow-up chest radiograph is recommended in 3-4 weeks, following conservative therapy, to document resolution. Electronically signed by: Dorethia Molt MD 07/28/2024 08:55 PM EDT RP Workstation: [id number]   LONG TERM MONITOR-LIVE TELEMETRY (3-14 DAYS) Result Date: 07/28/2024 Patch Wear Time:  13 days and 15 hours (2025-09-25T11:28:50-0400 to 2025-10-09T02:50:26-0400) Patient had a min HR of 63 bpm, max HR of 245 bpm, and avg HR of 88 bpm. Predominant underlying rhythm was Sinus Rhythm. 3 Ventricular Tachycardia runs occurred, the run with the fastest interval lasting 15 beats with a max rate of 245 bpm (avg 190 bpm);  the run with the fastest  interval was also the longest. 219 Supraventricular Tachycardia runs occurred, the run with the fastest interval lasting 11 beats with a max rate of 188 bpm, the longest lasting 12.1 secs with an avg rate of 137 bpm. Isolated SVEs were occasional (3.7%, 60574), SVE Couplets were occasional (1.1%, 8699), and SVE Triplets were rare (<1.0%, 1052). Isolated VEs were rare (<1.0%, 3937), VE Couplets were rare (<1.0%, 85), and VE Triplets were rare (<1.0%, 1). Ventricular Trigeminy was present. Inverted QRS complexes possibly due to inverted placement of device. NSR, sinus tachycardia, occasional PAC and PVC; short runs of SVT; short runs of NSVT (longest 15 beats). Redell Shallow  US  EKG SITE RITE Result Date: 07/20/2024 If Site Rite image not attached, placement could not be confirmed due to current cardiac rhythm.  CT CYSTOGRAM ABD/PELVIS Result Date: 07/17/2024 CLINICAL DATA:  Pelvic abscess with possible communication between the bladder and the abscess. EXAM: CT CYSTOGRAM (CT ABDOMEN AND PELVIS WITH CONTRAST) TECHNIQUE: Multi-detector CT imaging through the abdomen and pelvis was performed after dilute contrast had been introduced into the bladder for the purposes of performing CT cystography. RADIATION DOSE REDUCTION: This exam was performed according to the departmental dose-optimization program which includes automated exposure control, adjustment of the mA and/or kV according to patient size and/or use of iterative reconstruction technique. CONTRAST:  50mL OMNIPAQUE  IOHEXOL  300 MG/ML  SOLN COMPARISON:  MRI 07/16/2024. FINDINGS: Lower chest: Bronchiectasis with architectural distortion and atelectasis/scarring noted in both lung bases, right greater. Hepatobiliary: No suspicious focal abnormality in the liver on this study without intravenous contrast. There is no evidence for gallstones, gallbladder wall thickening, or pericholecystic fluid. No intrahepatic or extrahepatic biliary dilation. Pancreas: No  focal mass lesion. No dilatation of the main duct. No intraparenchymal cyst. No peripancreatic edema. Spleen: No splenomegaly. No suspicious focal mass lesion. Adrenals/Urinary Tract: No adrenal nodule or mass. Mild fullness noted right ureter without overt right hydroureteronephrosis. There is mild left hydroureteronephrosis. Contrast material in the bladder has been introduced in a retrograde fashion. Bladder is poorly distended but there is evidence for extraperitoneal extravasation of contrast into the pelvic floor with contrast material tracking anteriorly in the pelvis through the region of the symphysis pubis into a thick walled collection of fluid, gas, and extravasated contrast in the anterior pelvic wall corresponding to the described abscess on CT of 07/10/2024. the fistula between the bladder seems to tract just caudal to the symphysis pubis although it may also tract directly through the symphysis pubis itself. Stomach/Bowel: Stomach is unremarkable. No gastric wall thickening. No evidence of outlet obstruction. Duodenum is normally positioned as is the ligament of Treitz. No small bowel wall thickening. No small bowel dilatation. The terminal ileum is normal. The appendix is normal. No gross colonic mass. No colonic wall thickening. Moderate stool volume evident. Vascular/Lymphatic: There is moderate atherosclerotic calcification of the abdominal aorta without aneurysm. There is no gastrohepatic or hepatoduodenal ligament lymphadenopathy. No retroperitoneal or mesenteric lymphadenopathy. No pelvic sidewall lymphadenopathy. Reproductive: Prostate gland not well seen. Other: No substantial intraperitoneal free fluid. Musculoskeletal: Diffuse body wall edema evident. There is erosion bony anatomy in the pubic bones bilaterally, left greater than right, raising concern for metastatic disease and/or osteomyelitis. Compression deformity noted at T11 and T12. IMPRESSION: 1. Extraperitoneal extravasation of  contrast at the bladder base into the pelvic floor with contrast material tracking anteriorly in the pelvis through the region of the symphysis pubis into a thick walled collection of fluid, gas, and  extravasated contrast in the anterior pelvic wall corresponding to the described abscess on CT of 07/10/2024. 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 2. Erosion of the bony anatomy in the pubic bones bilaterally, left greater than right, raising concern for metastatic disease and/or osteomyelitis. This was better characterized on recent MRI of the pelvis. 3. Mild left hydroureteronephrosis. 4. Bronchiectasis with architectural distortion and atelectasis/scarring in both lung bases, right greater than left. 5. Diffuse body wall edema. 6. Compression deformity at T11 and T12. 7.  Aortic Atherosclerosis (ICD10-I70.0). Electronically Signed   By: Camellia Candle M.D.   On: 07/17/2024 12:50   MR PELVIS W WO CONTRAST Result Date: 07/17/2024 CLINICAL DATA:  Prior radiation therapy to the pelvis for prostate cancer. Cortical erosions of the pubis. History pelvic/prostate radiotherapy in 2003, salvage SBRT to the left seminal vesicle and T12 in 2022, and more recently SP RT to a left pubic ramus metastasis in 2023. EXAM: MRI PELVIS WITHOUT AND WITH CONTRAST TECHNIQUE: Multiplanar multisequence MR imaging of the pelvis was performed both before and after administration of intravenous contrast. CONTRAST:  6.5mL GADAVIST GADOBUTROL 1 MMOL/ML IV SOLN COMPARISON:  CT scan 07/10/2024 FINDINGS: Urinary Tract: Mild distal hydroureter. Irregular and thick-walled urinary bladder with enhancing margins compatible with cystitis, with a Foley catheter in place. There is some mixed signal intensity material inferiorly in the urinary bladder below the Foley catheter balloon, and suspected continuity of the anterior wall of the urinary bladder with the pubic  symphysis on image 36 series 11. The fluid in the pubic symphysis itself is questionably continuous with an apparent abscess of the rectus abdominus musculature (left greater than right) and linea alba measuring about 7.1 by 2.4 by 8.3 cm (volume = 74 cm^3), with internal gas and fluid density and prominently enhancing margins. If clinically warranted, cystogram conducted through the Foley catheter could be utilized to assess for confirm continuity of the urinary bladder with the pubic symphysis and anterior abdominal wall abscess. Bowel: Moderate lower rectal wall thickening, radiation proctitis not excluded. Vascular/Lymphatic: Unremarkable Reproductive:  Recognizable prostate gland not identified. Other: Low-level edema tracks along the iliacus and gluteal musculature. More striking edema along the hip adductor musculature and pelvic sidewall musculature could reflect myositis. Musculoskeletal: Bony erosions in the pubic bodies along the pubic symphysis with associated marrow edema in the pubic bodies and adjacent pubic rami favoring osteomyelitis. Enhancing lesion favoring a metastatic deposit in the left ischial tuberosity measuring 2.8 cm in diameter on image 40 series 7. Bandlike edema with central low signal T2 intensity in the right iliac bone adjacent to the SI joint measuring 4.4 by 1.5 cm on image 19 series 6, metastatic lesion not excluded. 1.5 cm in diameter lesion with high T2 and low T1 signal in the left femoral head and neck on image 24 series 3, metastatic lesion not excluded. Lower lumbar spondylosis and degenerative disc disease. IMPRESSION: 1. Bony erosions in the pubic bodies along the pubic symphysis with associated marrow edema in the pubic bodies and adjacent pubic rami favoring osteomyelitis. 2. Suspected continuity of the anterior lumen of the urinary bladder with the pubic symphysis and anterior abdominal wall abscess. if clinically warranted, cystogram through the Foley catheter could  be utilized to assess for continuity of the urinary bladder with the pubic symphysis and anterior abdominal wall abscess. 3. Enhancing lesion favoring a metastatic deposit in the left ischial tuberosity. 4. Bandlike edema with central low  signal T2 intensity in the right iliac bone adjacent to the SI joint, metastatic lesion not excluded. 5. 1.5 cm in diameter lesion with high T2 and low T1 signal in the left femoral head and neck, metastatic lesion not excluded. 6. Moderate lower rectal wall thickening, radiation proctitis not excluded. 7. Mild distal hydroureter. 8. Low-level edema tracks along the iliacus and gluteal musculature. More striking edema along the hip adductor musculature and pelvic sidewall musculature could reflect myositis. Electronically Signed   By: Ryan Salvage M.D.   On: 07/17/2024 11:43   US  Abscess Drain Result Date: 07/12/2024 INDICATION: Severe lower abdominal pain. CT demonstrates Low anterior rectus abdominus collection with surrounding stranding likely representing abscess. EXAM: U/S ASPIRATION OF ANTERIOR BODY WALL COLLECTION MEDICATIONS: No periprocedural antibiotics were indicated ANESTHESIA/SEDATION: 1% lidocaine  subcutaneous COMPLICATIONS: None immediate. PROCEDURE: Informed written consent was obtained from the patient after a thorough discussion of the procedural risks, benefits and alternatives. All questions were addressed. Maximal Sterile Barrier Technique was utilized including caps, mask, sterile gowns, sterile gloves, sterile drape, hand hygiene and skin antiseptic. A timeout was performed prior to the initiation of the procedure. Survey ultrasound of the left pelvic anterior body wall revealed a complex fluid collection corresponding to recent CT findings. An appropriate lateral skin entry site was determined and marked, then prepped with chlorhexidine , draped in usual sterile fashion, infiltrated locally with 1% lidocaine . Under real-time ultrasound guidance, a  10 cm multi sidehole 5 French Yueh sheath needle advanced into the collection. Approximately 30 mL of cloudy serosanguineous fluid were aspirated using 2 passes. These were sent for culture and sensitivity. Postprocedure scans show significant decompression of the collection, with small residual fluid. The patient tolerated the procedure well. IMPRESSION: 1. Technically successful ultrasound-guided aspiration of left anterior pelvic body wall collection, removing 30 mL cloudy fluid, sent for Gram stain and culture. Electronically Signed   By: JONETTA Faes M.D.   On: 07/12/2024 16:07   ECHOCARDIOGRAM LIMITED Result Date: 07/11/2024    ECHOCARDIOGRAM LIMITED REPORT   Patient Name:   Ronald Kaiser Date of Exam: 07/11/2024 Medical Rec #:  [id number]      Height:       71.0 in Accession #:    [id number]     Weight:       141.0 lb Date of Birth:  [birth year]     BSA:          1.817 m Patient Age:    89 years       BP:           96/51 mmHg Patient Gender: M              HR:           81 bpm. Exam Location:  Inpatient Procedure: 2D Echo, Limited Color Doppler and Intracardiac Opacification Agent Indications:    Congestive heart failure  History:        Patient has prior history of Echocardiogram examinations. CHF,                 CAD; Risk Factors:Hypertension.  Sonographer:    Charmaine Gaskins Referring Phys: [id number] SUBRINA SUNDIL IMPRESSIONS  1. Left ventricular ejection fraction, by estimation, is 25 to 30%. The left ventricle has severely decreased function. The left ventricle demonstrates regional wall motion abnormalities with mid to apical anteroseptal and inferoseptal akinesis, apical anterior akinesis, apical lateral akinesis, apical inferior akinesis and akinesis of the true apex. LAD territory wall motion abnormalities. No LV  thrombus visualized.  2. The mitral valve is normal in structure. Mild mitral valve regurgitation. No evidence of mitral stenosis.  3. The aortic valve is tricuspid. There is moderate  calcification of the aortic valve. Aortic valve regurgitation is mild. No aortic stenosis is present.  4. Aortic dilatation noted. There is mild dilatation of the aortic root, measuring 40 mm.  5. Limited echo for LV function. FINDINGS  Left Ventricle: Left ventricular ejection fraction, by estimation, is 25 to 30%. The left ventricle has severely decreased function. The left ventricle demonstrates regional wall motion abnormalities. Definity  contrast agent was given IV to delineate the left ventricular endocardial borders. Left Atrium: Left atrial size was normal in size. Right Atrium: Right atrial size was normal in size. Mitral Valve: The mitral valve is normal in structure. Mild mitral valve regurgitation. No evidence of mitral valve stenosis. Tricuspid Valve: Tricuspid valve regurgitation is mild. Aortic Valve: The aortic valve is tricuspid. There is moderate calcification of the aortic valve. Aortic valve regurgitation is mild. No aortic stenosis is present. Aorta: The aortic root is normal in size and structure and aortic dilatation noted. There is mild dilatation of the aortic root, measuring 40 mm.  LV Volumes (MOD) LV vol d, MOD A2C: 142.0 ml LV vol d, MOD A4C: 182.0 ml LV vol s, MOD A2C: 108.0 ml LV vol s, MOD A4C: 112.0 ml LV SV MOD A2C:     34.0 ml LV SV MOD A4C:     182.0 ml LV SV MOD BP:      57.0 ml RIGHT VENTRICLE RV S prime:     21.10 cm/s  AORTA Ao Root diam: 4.00 cm Ao Asc diam:  3.50 cm Dalton McleanMD Electronically signed by Ezra Kanner Signature Date/Time: 07/11/2024/1:35:24 PM    Final    CT Angio Chest/Abd/Pel for Dissection W and/or Wo Contrast Result Date: 07/10/2024 CLINICAL DATA:  Acute aortic syndrome suspected. Shortness of breath. EXAM: CT ANGIOGRAPHY CHEST, ABDOMEN AND PELVIS TECHNIQUE: Non-contrast CT of the chest was initially obtained. Multidetector CT imaging through the chest, abdomen and pelvis was performed using the standard protocol during bolus administration of  intravenous contrast. Multiplanar reconstructed images and MIPs were obtained and reviewed to evaluate the vascular anatomy. RADIATION DOSE REDUCTION: This exam was performed according to the departmental dose-optimization program which includes automated exposure control, adjustment of the mA and/or kV according to patient size and/or use of iterative reconstruction technique. CONTRAST:  75mL OMNIPAQUE  IOHEXOL  350 MG/ML SOLN COMPARISON:  Chest radiograph 07/10/2024. CT abdomen and pelvis 05/26/2024. CT chest 03/04/2022. PET CT 12/13/2022 FINDINGS: CTA CHEST FINDINGS Cardiovascular: Unenhanced images of the chest demonstrate diffuse calcification throughout the aorta and coronary arteries. No acute intramural hematoma. Images obtained during arterial phase after intravenous contrast material administration demonstrate patent common normal caliber thoracic aorta. No aortic dissection. Great vessel origins are patent. No filling defects in the central pulmonary arteries. No evidence of significant pulmonary embolus. Normal heart size although the left ventricle appears mildly dilated. No pericardial effusion. Mediastinum/Nodes: Esophagus is decompressed. No significant lymphadenopathy. Thyroid gland is unremarkable. Lungs/Pleura: Emphysematous changes in the lungs. Bilateral basilar atelectasis. Peribronchial thickening with mild bronchiectasis consistent with chronic bronchitis. No pleural effusion or pneumothorax. Musculoskeletal: Degenerative changes in the spine. Compression of T11 and T12 vertebra, unchanged. Sclerotic lesions demonstrated in several thoracic vertebrae and ribs corresponding to known metastatic disease. Review of the MIP images confirms the above findings. CTA ABDOMEN AND PELVIS FINDINGS VASCULAR Aorta: Normal caliber aorta without aneurysm, dissection,  vasculitis or significant stenosis. Diffuse aortic calcification. Celiac: Patent without evidence of aneurysm, dissection, vasculitis or  significant stenosis. SMA: Patent without evidence of aneurysm, dissection, vasculitis or significant stenosis. Renals: Both renal arteries are patent without evidence of aneurysm, dissection, vasculitis, fibromuscular dysplasia or significant stenosis. IMA: Patent without evidence of aneurysm, dissection, vasculitis or significant stenosis. Inflow: Patent without evidence of aneurysm, dissection, vasculitis or significant stenosis. Veins: No obvious venous abnormality within the limitations of this arterial phase study. Review of the MIP images confirms the above findings. NON-VASCULAR Hepatobiliary: No focal liver abnormality is seen. No gallstones, gallbladder wall thickening, or biliary dilatation. Pancreas: Unremarkable. No pancreatic ductal dilatation or surrounding inflammatory changes. Spleen: Normal in size without focal abnormality. Adrenals/Urinary Tract: No adrenal gland nodules. Renal nephrograms are symmetrical but there is bilateral hydronephrosis and hydroureter down to the level of the bladder. No ureteral stones are identified. Changes likely indicate reflux disease or bladder outlet obstruction. Diffuse bladder wall thickening likely to represent radiation change given history of prostate cancer. Bladder outlet obstruction could also have this appearance. Coarse calcifications in the dependent portion of the bladder or possibly in the bladder wall or prostate. This is unchanged since prior study. Stomach/Bowel: Stomach, small bowel, and colon are not abnormally distended. Stool throughout the colon. No wall thickening or inflammatory changes. Appendix is not identified. Lymphatic: No significant lymphadenopathy. Reproductive: Prostate gland is not well visualized and may be surgically absent or atrophic due to radiation therapy. In the prostate bed, there is increased soft tissue which extends into the anterolateral left pelvic sidewall and the left proximal hip adductor muscles. This likely  represents residual tumor or post treatment changes. Pelvic soft tissue infiltration is increasing since the previous studies. Loss of fat plane between the soft tissues and the bladder. Pelvic sidewall soft tissue today measures 3.2 cm thickness. Other: No free air or free fluid in the abdomen. There is a large heterogeneous collection in the inferior left and central rectus abdominus muscles extending down to the symphysis pubis. This collection measures up to about 5.2 cm diameter. Stranding in the surrounding subcutaneous fat. This is likely an abscess. Musculoskeletal: Degenerative changes in the spine and hips. Scattered focal areas of bone sclerosis consistent with known metastasis. Cortical erosion at the symphysis pubis may represent post treatment changes or osteomyelitis. Review of the MIP images confirms the above findings. IMPRESSION: 1. Aortic atherosclerosis. No evidence of aneurysm or dissection involving the thoracic or abdominal aorta. 2. Overall normal heart size with left ventricular dilatation. 3. Atelectasis in the lung bases. 4. Increasing soft tissue mass/infiltration in the low pelvis involving the prostate bed and extending to the left pelvic sidewall and into the proximal left adductor muscles. This likely represents residual recurrent tumor versus post treatment changes or possibly inflammatory process. 5. Diffuse bladder wall thickening may be due to bladder outlet obstruction or treatment changes. Stable calcification in the bladder or prostate region. Bilateral hydronephrosis and hydroureter likely related to reflux or bladder outlet obstruction. 6. Low anterior rectus abdominus collection with surrounding stranding likely representing abscess. 7. Cortical erosion at the symphysis pubis may represent osteomyelitis or possibly radiation changes. 8. Multiple sclerotic bone lesions consistent with known metastatic disease. Electronically Signed   By: Elsie Gravely M.D.   On: 07/10/2024  23:03   DG Chest Port 1 View Result Date: 07/10/2024 CLINICAL DATA:  Question of sepsis to evaluate for abnormality. Diffuse abdominal pain for 2 weeks radiating down the legs. EXAM: PORTABLE CHEST 1  VIEW COMPARISON:  06/06/2024 FINDINGS: Shallow inspiration. Heart size and pulmonary vascularity are normal for technique. Peribronchial thickening with bronchiectasis in streaky perihilar opacities likely representing bronchitic changes. This could represent acute or chronic bronchitis. Scarring in the lung apices and bases. Probable linear atelectasis also in the lung bases. No pleural effusion or pneumothorax. Mediastinal contours appear intact. Calcification of the aorta. Poorly defined sclerotic lesions demonstrated in the scapula and ribs correlating with known metastatic disease. IMPRESSION: 1. Shallow inspiration. 2. Emphysematous changes, fibrosis, and bronchitic changes in the lungs. 3. Mild linear atelectasis in the lung bases. No focal consolidation. 4. Sclerotic bone lesions corresponding to known metastatic disease. Electronically Signed   By: Elsie Gravely M.D.   On: 07/10/2024 19:37    Microbiology: Results for orders placed or performed during the hospital encounter of 07/28/24  Culture, blood (Routine x 2)     Status: None (Preliminary result)   Collection Time: 07/28/24  3:10 AM   Specimen: BLOOD LEFT FOREARM  Result Value Ref Range Status   Specimen Description BLOOD LEFT FOREARM  Final   Special Requests BOTTLES DRAWN AEROBIC AND ANAEROBIC  Final   Culture   Final    NO GROWTH 4 DAYS Performed at [facility], [address] [place], [place] [address]    Report Status PENDING  Incomplete  Culture, blood (Routine x 2)     Status: None   Collection Time: 07/28/24  8:21 PM   Specimen: BLOOD  Result Value Ref Range Status   Specimen Description BLOOD SITE NOT SPECIFIED  Final   Special Requests   Final    BOTTLES DRAWN AEROBIC AND ANAEROBIC Blood Culture adequate  volume   Culture   Final    NO GROWTH 5 DAYS Performed at [facility], [address] [place], [place] [address]    Report Status 08/02/2024 FINAL  Final  Urine Culture     Status: None   Collection Time: 07/28/24  8:21 PM   Specimen: Urine, Random  Result Value Ref Range Status   Specimen Description URINE, RANDOM  Final   Special Requests NONE Reflexed from [id number]  Final   Culture   Final    NO GROWTH Performed at [facility], [address], [place], [place] [address]    Report Status 07/29/2024 FINAL  Final  Resp panel by RT-PCR (RSV, Flu A&B, Covid) Anterior Nasal Swab     Status: None   Collection Time: 07/28/24 11:30 PM   Specimen: Anterior Nasal Swab  Result Value Ref Range Status   SARS Coronavirus 2 by RT PCR NEGATIVE NEGATIVE Final   Influenza A by PCR NEGATIVE NEGATIVE Final   Influenza B by PCR NEGATIVE NEGATIVE Final    Comment: (NOTE) The Xpert Xpress SARS-CoV-2/FLU/RSV plus assay is intended as an aid in the diagnosis of influenza from Nasopharyngeal swab specimens and should not be used as a sole basis for treatment. Nasal washings and aspirates are unacceptable for Xpert Xpress SARS-CoV-2/FLU/RSV testing.  Fact Sheet for Patients: bloggercourse.com  Fact Sheet for Healthcare Providers: seriousbroker.it  This test is not yet approved or cleared by the [place]  FDA and has been authorized for detection and/or diagnosis of SARS-CoV-2 by FDA under an Emergency Use Authorization (EUA). This EUA will remain in effect (meaning this test can be used) for the duration of the COVID-19 declaration under Section 564(b)(1) of the Act, 21 U.S.C. section 360bbb-3(b)(1), unless the authorization is terminated or revoked.  Resp Syncytial Virus by PCR NEGATIVE NEGATIVE Final    Comment: (NOTE) Fact Sheet for Patients: bloggercourse.com  Fact Sheet for Healthcare  Providers: seriousbroker.it  This test is not yet approved or cleared by the [place]  FDA and has been authorized for detection and/or diagnosis of SARS-CoV-2 by FDA under an Emergency Use Authorization (EUA). This EUA will remain in effect (meaning this test can be used) for the duration of the COVID-19 declaration under Section 564(b)(1) of the Act, 21 U.S.C. section 360bbb-3(b)(1), unless the authorization is terminated or revoked.  Performed at [facility], [address] [place], [place] [address]     Labs: CBC: Recent Labs  Lab 07/28/24 2021 07/29/24 0259 07/30/24 0524 07/31/24 0350 08/01/24 0350  WBC 10.0 6.5 4.2 4.5 4.8  NEUTROABS 9.2*  --   --   --   --   HGB 8.5* 7.3* 7.0* 7.5* 7.9*  HCT 26.8* 23.0* 21.5* 23.3* 25.6*  MCV 95.0 95.0 93.5 94.7 96.6  PLT 262 216 204 223 242   Basic Metabolic Panel: Recent Labs  Lab 07/28/24 2021 07/30/24 0524 07/31/24 0350 08/01/24 0350  NA 132* 131* 131* 136  K 4.1 3.5 4.0 4.2  CL 94* 96* 97* 98  CO2 23 25 26 28   GLUCOSE 124* 100* 101* 107*  BUN 31* 21 19 15   CREATININE 0.79 0.87 0.66 0.54*  CALCIUM  8.1* 7.9* 8.1* 8.2*   Liver Function Tests: Recent Labs  Lab 07/28/24 2021  AST 21  ALT 14  ALKPHOS 58  BILITOT 0.2  PROT 6.7  ALBUMIN 2.2*   CBG: No results for input(s): GLUCAP in the last 168 hours.  Discharge time spent: approximately 25 minutes spent on discharge counseling, evaluation of patient on day of discharge, and coordination of discharge planning with nursing, social work, pharmacy and case management  Signed: Lonni SHAUNNA Dalton, MD [facility] 08/02/2024

## 2024-08-02 NOTE — Progress Notes (Signed)
 Called Ronald Kaiser at 12:30  to advise that Ronald Kaiser would be picked up by [facility] in about an hour.

## 2024-08-02 NOTE — Progress Notes (Addendum)
 Mobility Specialist Progress Note:    08/01/24 1232  Mobility  Activity Ambulated with assistance (In hallway)  Level of Assistance Standby assist, set-up cues, supervision of patient - no hands on  Assistive Device Four wheel walker  Distance Ambulated (ft) 140 ft  Activity Response Tolerated well  Mobility Referral Yes  Mobility visit 1 Mobility  Mobility Specialist Start Time (ACUTE ONLY) 1154  Mobility Specialist Stop Time (ACUTE ONLY) 1208  Mobility Specialist Time Calculation (min) (ACUTE ONLY) 14 min   Received pt in bed having no complaints and agreeable to mobility. Pt was asymptomatic throughout ambulation and returned to room w/o fault. Left in chair w/ call bell in reach and all needs met.   Lavanda Pollack Mobility Specialist  Please contact via [employer] or  Rehab Office [phone]

## 2024-08-02 NOTE — TOC Transition Note (Signed)
 Transition of Care [facility]) - Discharge Note   Patient Details  Name: Ronald Kaiser MRN: [id number] Date of Birth: Apr 24, 1934  Transition of [facility]) CM/SW Contact:  Sudie Erminio Deems, RN Phone Number: 08/02/2024, 12:49 PM   Clinical Narrative:  Patient will discharge to home with Hospice Services today. DME has arrived to the home. ICM called [facility] and they will arrive within the next hour. [facility] to follow up with the family for initial visit. No further needs identified by ICM.   Final next level of care: Home w Hospice Care Barriers to Discharge: No Barriers Identified   Patient Goals and CMS Choice Patient states their goals for this hospitalization and ongoing recovery are:: Plans to return home with Hospice Services.   Choice offered to / list presented to : Spouse (asked for [facility] in [place].)   Discharge Plan and Services Additional resources added to the After Visit Summary for   In-house Referral: NA Discharge Planning Services: CM Consult Post Acute Care Choice: Skilled Nursing Facility, Hospice            HH Arranged: RN [facility] Agency: [facility] Date [facility] Agency Contacted: 08/01/24 Time HH Agency Contacted: 1248 Representative spoke with at [facility] Agency: Melissa  Social Drivers of Health (SDOH) Interventions SDOH Screenings   Food Insecurity: No Food Insecurity (07/29/2024)  Housing: Low Risk  (07/29/2024)  Transportation Needs: No Transportation Needs (07/29/2024)  Utilities: Not At Risk (07/29/2024)  Social Connections: Patient Declined (07/29/2024)  Recent Concern: Social Connections - Moderately Isolated (06/07/2024)  Tobacco Use: Medium Risk (07/29/2024)   Readmission Risk Interventions    07/30/2024    7:35 PM 07/19/2024    6:07 PM 06/10/2024   10:37 AM  Readmission Risk Prevention Plan  Transportation Screening Complete Complete Complete  PCP or  Specialist Appt within 3-5 Days   Complete  HRI or Home Care Consult   Complete  Palliative Care Screening   Not Applicable  Medication Review (RN Care Manager) Complete Complete Complete  PCP or Specialist appointment within 3-5 days of discharge Complete Complete   HRI or Home Care Consult Complete Complete   SW Recovery Care/Counseling Consult Complete Complete   Palliative Care Screening Not Applicable Not Applicable   Skilled Nursing Facility  Patient Refused

## 2024-08-03 LAB — CULTURE, BLOOD (ROUTINE X 2): Culture: NO GROWTH

## 2024-08-05 ENCOUNTER — Ambulatory Visit: Payer: Self-pay | Admitting: Cardiology

## 2024-08-05 NOTE — Progress Notes (Signed)
 Received notification of [facility] dc 08/02/24. Per chart review patient was discharged home with Hospice services through [facility]. HN spoke with Hospice staff who verified the patient will be following the Hospice provider as attending. No further outreach warranted from the [facility] care coordination team. HN will notify PCP.    Ellouise Quale, RN CHESS Navigator [phone]

## 2024-08-15 NOTE — Progress Notes (Deleted)
 HPI: FU coronary artery disease. Patient had a myocardial infarction in July of 2000. He was treated with TPA and had stenting of his LAD and PTCA of his second diagonal. Abdominal ultrasound in April of 2014 showed no aneurysm. Nuclear study May 2017 showed ejection fraction of 42%, inferoseptal ischemia and a prior anterior infarct with peri-infarct ischemia. Cardiac catheterization June 2017 showed a 50% proximal right coronary artery, 45% proximal to distal LAD and 50% circumflex. Ejection fraction 45-50%.  Patient admitted February 2020 with volume excess.  He had an episode of atrial fibrillation as well but was not anticoagulated.  Monitor October 2025 showed sinus rhythm with short runs of SVT and short runs of nonsustained ventricular tachycardia (longest 15 beats).  Most recent echocardiogram October 2025 showed ejection fraction 25 to 30%, grade 1 diastolic dysfunction, moderate left atrial enlargement, mild mitral regurgitation, mobile echodensities on the aortic valve, mild aortic insufficiency, mildly dilated aortic root at 41 mm.  Venous Dopplers October 2025 showed acute DVT in the right proximal profunda vein.  CTA October 2025 showed pulmonary emboli.  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  Patient ultimately discharged to hospice.  Since last seen,   Current Outpatient Medications  Medication Sig Dispense Refill   acetaminophen  (TYLENOL ) 325 MG tablet Take 2 tablets (650 mg total) by mouth every 6 (six) hours as needed for mild pain (pain score 1-3) or fever (or Fever >/= 101). (Patient not taking: Reported on 07/28/2024)     amoxicillin (AMOXIL) 500 MG capsule Take 2 capsules (1,000 mg total) by mouth 3 (three) times daily. 180 capsule 0   enzalutamide  (XTANDI ) 80 MG tablet Take 2 tablets (160 mg total) by mouth daily. Resume home meds (Patient not  taking: Reported on 07/28/2024)     feeding supplement (ENSURE PLUS HIGH PROTEIN) LIQD Take 237 mLs by mouth 3 (three) times daily between meals.     ferrous sulfate  325 (65 FE) MG EC tablet Take 325 mg by mouth daily with breakfast.     leptospermum manuka honey (MEDIHONEY) gel Apply 1 Application topically daily. 44 mL 0   metroNIDAZOLE (FLAGYL) 500 MG tablet Take 1 tablet (500 mg total) by mouth 2 (two) times daily. 60 tablet 0   midodrine  (PROAMATINE ) 10 MG tablet Take 1 tablet (10 mg total) by mouth 3 (three) times daily with meals. 90 tablet 0   ondansetron  (ZOFRAN ) 4 MG tablet Take 1 tablet (4 mg total) by mouth every 8 (eight) hours as needed for nausea or vomiting. 20 tablet 0   pantoprazole  (PROTONIX ) 40 MG tablet Take 1 tablet (40 mg total) by mouth daily. 30 tablet 0   pentoxifylline (TRENTAL) 400 MG CR tablet Take 1 tablet (400 mg total) by mouth 3 (three) times daily with meals. 90 tablet 0   pravastatin  (PRAVACHOL ) 20 MG tablet Take 1 tablet (20 mg total) by mouth every evening. 90 tablet 3   No current facility-administered medications for this visit.     Past Medical History:  Diagnosis Date   AKI (acute kidney injury) 11/12/2018   CAD (coronary artery disease)    CHF (congestive heart failure) (HCC)    Dyspnea    Foley catheter in place    HTN (hypertension)    Hyperlipidemia    Myocardial infarction (HCC) 2000   REPORTS HE HAS HAD 5 HEART ATTACKS BUT SOME OF THE  WERE NOT SERIOUS    Obesity    Prostate cancer [facility])     Past Surgical History:  Procedure Laterality Date   CARDIAC CATHETERIZATION N/A 03/09/2016   Procedure: Left Heart Cath and Coronary Angiography;  Surgeon: Victory LELON Sharps, MD;  Location: [facility] INVASIVE CV LAB;  Service: Cardiovascular;  Laterality: N/A;   COLONOSCOPY N/A 05/28/2024   Procedure: COLONOSCOPY;  Surgeon: Leigh Elspeth SQUIBB, MD;  Location: [facility] ENDOSCOPY;  Service: Gastroenterology;  Laterality: N/A;   CYSTOSCOPY W/ RETROGRADES Bilateral  08/19/2020   Procedure: BILATERAL RETROGRADE PYELOGRAM;  Surgeon: Cam Morene LELON, MD;  Location: [facility] ORS;  Service: Urology;  Laterality: Bilateral;   ESOPHAGOGASTRODUODENOSCOPY N/A 05/28/2024   Procedure: EGD (ESOPHAGOGASTRODUODENOSCOPY);  Surgeon: Leigh Elspeth SQUIBB, MD;  Location: [facility] ENDOSCOPY;  Service: Gastroenterology;  Laterality: N/A;   RADIATION TO PROSTATE     Stenting of LAD  2000   TONSILLECTOMY     TRANSURETHRAL RESECTION OF PROSTATE N/A 03/01/2019   Procedure: TRANSURETHRAL RESECTION OF THE PROSTATE (TURP);  Surgeon: Cam Morene LELON, MD;  Location: [facility] ORS;  Service: Urology;  Laterality: N/A;  75 MINS   TRANSURETHRAL RESECTION OF PROSTATE N/A 08/19/2020   Procedure: TRANSURETHRAL RESECTION OF THE PROSTATE (TURP);  Surgeon: Cam Morene LELON, MD;  Location: [facility] ORS;  Service: Urology;  Laterality: N/A;    Social History   Socioeconomic History   Marital status: Married    Spouse name: Not on file   Number of children: Not on file   Years of education: Not on file   Highest education level: Not on file  Occupational History    Comment: retired  Tobacco Use   Smoking status: Former    Current packs/day: 0.00    Average packs/day: 0.5 packs/day for 7.0 years (3.5 ttl pk-yrs)    Types: Cigarettes    Start date: 10/04/1955    Quit date: 10/03/1962    Years since quitting: 61.9   Smokeless tobacco: Never  Vaping Use   Vaping status: Never Used  Substance and Sexual Activity   Alcohol use: Yes    Comment: [facility] BE[employer] WINE    Drug use: Not Currently   Sexual activity: Not Currently  Other Topics Concern   Not on file  Social History Narrative   Moved from [facility] to [place], [place] following divorce to be closer to his brother. Patient has remarried.   Social Drivers of Corporate Investment Banker Strain: Not on file  Food Insecurity: No Food Insecurity (07/29/2024)   Hunger Vital Sign    Worried About Running Out of Food in the Last Year: Never true    Ran Out  of Food in the Last Year: Never true  Transportation Needs: No Transportation Needs (07/29/2024)   PRAPARE - Administrator, Civil Service (Medical): No    Lack of Transportation (Non-Medical): No  Physical Activity: Not on file  Stress: Not on file  Social Connections: Patient Declined (07/29/2024)   Social Connection and Isolation Panel    Frequency of Communication with Friends and Family: Patient declined    Frequency of Social Gatherings with Friends and Family: Patient declined    Attends Religious Services: Patient declined    Database Administrator or Organizations: Patient declined    Attends Banker Meetings: Patient declined    Marital Status: Patient declined  Recent Concern: Social Connections - Moderately Isolated (06/07/2024)   Social Connection and Isolation Panel    Frequency of Communication with Friends  and Family: More than three times a week    Frequency of Social Gatherings with Friends and Family: More than three times a week    Attends Religious Services: Never    Database Administrator or Organizations: No    Attends Banker Meetings: Never    Marital Status: Married  Catering Manager Violence: Not At Risk (07/29/2024)   Humiliation, Afraid, Rape, and Kick questionnaire    Fear of Current or Ex-Partner: No    Emotionally Abused: No    Physically Abused: No    Sexually Abused: No    Family History  Problem Relation Age of Onset   Hypertension Father    Breast cancer Neg Hx    Prostate cancer Neg Hx    Colon cancer Neg Hx    Pancreatic cancer Neg Hx     ROS: no fevers or chills, productive cough, hemoptysis, dysphasia, odynophagia, melena, hematochezia, dysuria, hematuria, rash, seizure activity, orthopnea, PND, pedal edema, claudication. Remaining systems are negative.  Physical Exam: Well-developed well-nourished in no acute distress.  Skin is warm and dry.  HEENT is normal.  Neck is supple.  Chest is clear to  auscultation with normal expansion.  Cardiovascular exam is regular rate and rhythm.  Abdominal exam nontender or distended. No masses palpated. Extremities show no edema. 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

## 2024-08-22 ENCOUNTER — Inpatient Hospital Stay: Payer: Self-pay | Admitting: Infectious Diseases

## 2024-08-22 ENCOUNTER — Ambulatory Visit (HOSPITAL_BASED_OUTPATIENT_CLINIC_OR_DEPARTMENT_OTHER): Admitting: Family Medicine

## 2024-08-26 ENCOUNTER — Other Ambulatory Visit (HOSPITAL_COMMUNITY): Payer: Self-pay

## 2024-08-26 ENCOUNTER — Ambulatory Visit: Admitting: Cardiology

## 2024-10-02 ENCOUNTER — Ambulatory Visit: Admitting: Cardiology

## 2024-10-14 ENCOUNTER — Telehealth: Payer: Self-pay | Admitting: *Deleted

## 2024-10-14 NOTE — Telephone Encounter (Signed)
 Patient presents to office and picked up refurbished orthotics
